# Patient Record
Sex: Female | Born: 1955 | Race: Black or African American | Hispanic: No | Marital: Single | Smoking: Never smoker
Health system: Southern US, Community
[De-identification: ages and names within clinical notes are randomized; demographics above are authoritative.]

## PROBLEM LIST (undated history)

## (undated) DIAGNOSIS — M5430 Sciatica, unspecified side: Secondary | ICD-10-CM

## (undated) DIAGNOSIS — M4640 Discitis, unspecified, site unspecified: Secondary | ICD-10-CM

## (undated) DIAGNOSIS — I739 Peripheral vascular disease, unspecified: Secondary | ICD-10-CM

## (undated) DIAGNOSIS — K219 Gastro-esophageal reflux disease without esophagitis: Secondary | ICD-10-CM

## (undated) DIAGNOSIS — I890 Lymphedema, not elsewhere classified: Secondary | ICD-10-CM

## (undated) DIAGNOSIS — I10 Essential (primary) hypertension: Secondary | ICD-10-CM

## (undated) DIAGNOSIS — M722 Plantar fascial fibromatosis: Secondary | ICD-10-CM

## (undated) DIAGNOSIS — M1990 Unspecified osteoarthritis, unspecified site: Secondary | ICD-10-CM

## (undated) DIAGNOSIS — E7800 Pure hypercholesterolemia, unspecified: Secondary | ICD-10-CM

## (undated) DIAGNOSIS — E119 Type 2 diabetes mellitus without complications: Secondary | ICD-10-CM

## (undated) DIAGNOSIS — G629 Polyneuropathy, unspecified: Secondary | ICD-10-CM

## (undated) DIAGNOSIS — L0291 Cutaneous abscess, unspecified: Secondary | ICD-10-CM

## (undated) HISTORY — DX: Lymphedema, not elsewhere classified: I89.0

## (undated) HISTORY — PX: EYE SURGERY: SHX253

## (undated) HISTORY — PX: COLONOSCOPY: SHX174

## (undated) HISTORY — DX: Peripheral vascular disease, unspecified: I73.9

---
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
## 2006-12-13 ENCOUNTER — Other Ambulatory Visit: Payer: Self-pay

## 2006-12-13 ENCOUNTER — Emergency Department: Payer: Self-pay | Admitting: Emergency Medicine

## 2007-03-26 ENCOUNTER — Ambulatory Visit: Payer: Self-pay | Admitting: Family Medicine

## 2007-08-10 ENCOUNTER — Ambulatory Visit: Payer: Self-pay

## 2008-05-24 ENCOUNTER — Ambulatory Visit: Payer: Self-pay | Admitting: Family Medicine

## 2008-11-29 ENCOUNTER — Ambulatory Visit: Payer: Self-pay | Admitting: Family Medicine

## 2008-12-07 ENCOUNTER — Ambulatory Visit: Payer: Self-pay | Admitting: Family Medicine

## 2009-06-22 ENCOUNTER — Ambulatory Visit: Payer: Self-pay | Admitting: Family Medicine

## 2009-06-23 ENCOUNTER — Ambulatory Visit: Payer: Self-pay | Admitting: Neurology

## 2009-07-12 ENCOUNTER — Ambulatory Visit: Payer: Self-pay | Admitting: Gastroenterology

## 2010-01-03 ENCOUNTER — Ambulatory Visit: Payer: Self-pay | Admitting: Family Medicine

## 2011-04-04 ENCOUNTER — Ambulatory Visit: Payer: Self-pay | Admitting: Family Medicine

## 2011-07-09 ENCOUNTER — Emergency Department: Payer: Self-pay | Admitting: Emergency Medicine

## 2012-11-11 ENCOUNTER — Ambulatory Visit: Payer: Self-pay | Admitting: Family Medicine

## 2013-02-26 ENCOUNTER — Emergency Department: Payer: Self-pay | Admitting: Emergency Medicine

## 2013-03-05 ENCOUNTER — Emergency Department: Payer: Self-pay | Admitting: Emergency Medicine

## 2013-03-05 LAB — URINALYSIS, COMPLETE
Blood: NEGATIVE
Glucose,UR: NEGATIVE mg/dL (ref 0–75)
Ketone: NEGATIVE
Nitrite: NEGATIVE
Ph: 7 (ref 4.5–8.0)
Protein: NEGATIVE
Specific Gravity: 1.019 (ref 1.003–1.030)

## 2013-03-05 LAB — COMPREHENSIVE METABOLIC PANEL
Albumin: 3.5 g/dL (ref 3.4–5.0)
Alkaline Phosphatase: 120 U/L (ref 50–136)
Anion Gap: 5 — ABNORMAL LOW (ref 7–16)
BUN: 16 mg/dL (ref 7–18)
Calcium, Total: 8.9 mg/dL (ref 8.5–10.1)
Chloride: 103 mmol/L (ref 98–107)
Creatinine: 1.11 mg/dL (ref 0.60–1.30)
EGFR (African American): >60
EGFR (Non-African Amer.): 56 — ABNORMAL LOW
Osmolality: 276 (ref 275–301)
Potassium: 4 mmol/L (ref 3.5–5.1)
SGOT(AST): 21 U/L (ref 15–37)

## 2013-03-05 LAB — CBC
HGB: 11.3 g/dL — ABNORMAL LOW (ref 12.0–16.0)
MCH: 27 pg (ref 26.0–34.0)
MCHC: 33.9 g/dL (ref 32.0–36.0)
RBC: 4.19 10*6/uL (ref 3.80–5.20)
RDW: 13.8 % (ref 11.5–14.5)
WBC: 5.5 10*3/uL (ref 3.6–11.0)

## 2013-09-09 ENCOUNTER — Ambulatory Visit: Payer: Self-pay | Admitting: Gastroenterology

## 2013-10-01 HISTORY — PX: COLONOSCOPY: SHX174

## 2013-10-28 ENCOUNTER — Ambulatory Visit: Payer: Self-pay | Admitting: Gastroenterology

## 2014-02-24 ENCOUNTER — Ambulatory Visit: Payer: Self-pay | Admitting: Family Medicine

## 2014-04-02 ENCOUNTER — Emergency Department: Payer: Self-pay | Admitting: Emergency Medicine

## 2015-04-25 ENCOUNTER — Other Ambulatory Visit: Payer: Self-pay | Admitting: Family Medicine

## 2015-04-25 DIAGNOSIS — Z139 Encounter for screening, unspecified: Secondary | ICD-10-CM

## 2015-05-12 ENCOUNTER — Other Ambulatory Visit: Payer: Self-pay | Admitting: Family Medicine

## 2015-05-12 ENCOUNTER — Ambulatory Visit
Admission: RE | Admit: 2015-05-12 | Discharge: 2015-05-12 | Disposition: A | Discharge status: Home / Self Care | Payer: Medicare Other | Source: Ambulatory Visit | Attending: Family Medicine | Admitting: Family Medicine

## 2015-05-12 DIAGNOSIS — Z1231 Encounter for screening mammogram for malignant neoplasm of breast: Secondary | ICD-10-CM

## 2015-05-12 DIAGNOSIS — Z139 Encounter for screening, unspecified: Secondary | ICD-10-CM

## 2015-05-12 DIAGNOSIS — Z1239 Encounter for other screening for malignant neoplasm of breast: Secondary | ICD-10-CM

## 2016-07-01 ENCOUNTER — Other Ambulatory Visit: Payer: Self-pay | Admitting: Family Medicine

## 2016-07-01 DIAGNOSIS — Z1231 Encounter for screening mammogram for malignant neoplasm of breast: Secondary | ICD-10-CM

## 2016-08-06 ENCOUNTER — Ambulatory Visit
Admission: RE | Admit: 2016-08-06 | Discharge: 2016-08-06 | Disposition: A | Discharge status: Home / Self Care | Payer: Medicare Other | Source: Ambulatory Visit | Attending: Family Medicine | Admitting: Family Medicine

## 2016-08-06 DIAGNOSIS — Z1231 Encounter for screening mammogram for malignant neoplasm of breast: Secondary | ICD-10-CM | POA: Insufficient documentation

## 2017-06-18 ENCOUNTER — Other Ambulatory Visit: Payer: Self-pay | Admitting: Family Medicine

## 2017-09-03 ENCOUNTER — Other Ambulatory Visit: Payer: Self-pay | Admitting: Family Medicine

## 2017-09-03 DIAGNOSIS — Z1231 Encounter for screening mammogram for malignant neoplasm of breast: Secondary | ICD-10-CM

## 2017-10-07 ENCOUNTER — Ambulatory Visit
Admission: RE | Admit: 2017-10-07 | Discharge: 2017-10-07 | Disposition: A | Discharge status: Home / Self Care | Payer: Medicare Other | Source: Ambulatory Visit | Attending: Family Medicine | Admitting: Family Medicine

## 2017-10-07 DIAGNOSIS — Z1231 Encounter for screening mammogram for malignant neoplasm of breast: Secondary | ICD-10-CM | POA: Insufficient documentation

## 2017-11-18 ENCOUNTER — Other Ambulatory Visit: Payer: Self-pay

## 2017-11-18 ENCOUNTER — Emergency Department: Payer: Medicare Other

## 2017-11-18 ENCOUNTER — Encounter: Payer: Self-pay | Admitting: Emergency Medicine

## 2017-11-18 ENCOUNTER — Emergency Department
Admission: EM | Admit: 2017-11-18 | Discharge: 2017-11-18 | Disposition: A | Discharge status: Home / Self Care | Payer: Medicare Other | Attending: Emergency Medicine | Admitting: Emergency Medicine

## 2017-11-18 DIAGNOSIS — I10 Essential (primary) hypertension: Secondary | ICD-10-CM | POA: Diagnosis not present

## 2017-11-18 DIAGNOSIS — Z7984 Long term (current) use of oral hypoglycemic drugs: Secondary | ICD-10-CM | POA: Insufficient documentation

## 2017-11-18 DIAGNOSIS — M546 Pain in thoracic spine: Secondary | ICD-10-CM

## 2017-11-18 DIAGNOSIS — Z7982 Long term (current) use of aspirin: Secondary | ICD-10-CM | POA: Insufficient documentation

## 2017-11-18 DIAGNOSIS — E119 Type 2 diabetes mellitus without complications: Secondary | ICD-10-CM | POA: Insufficient documentation

## 2017-11-18 DIAGNOSIS — R079 Chest pain, unspecified: Secondary | ICD-10-CM | POA: Diagnosis not present

## 2017-11-18 HISTORY — DX: Type 2 diabetes mellitus without complications: E11.9

## 2017-11-18 HISTORY — DX: Essential (primary) hypertension: I10

## 2017-11-18 HISTORY — DX: Pure hypercholesterolemia, unspecified: E78.00

## 2017-11-18 LAB — FIBRIN DERIVATIVES D-DIMER (ARMC ONLY): Fibrin derivatives D-dimer (ARMC): 1731.65 ng/mL (FEU) — ABNORMAL HIGH (ref 0.00–499.00)

## 2017-11-18 LAB — BASIC METABOLIC PANEL
Anion gap: 10 (ref 5–15)
BUN: 18 mg/dL (ref 6–20)
CALCIUM: 8.8 mg/dL — AB (ref 8.9–10.3)
CO2: 23 mmol/L (ref 22–32)
CREATININE: 0.98 mg/dL (ref 0.44–1.00)
Chloride: 103 mmol/L (ref 101–111)
GFR calc non Af Amer: >60 mL/min (ref >60)
Glucose, Bld: 118 mg/dL — ABNORMAL HIGH (ref 65–99)
Potassium: 4 mmol/L (ref 3.5–5.1)
SODIUM: 136 mmol/L (ref 135–145)

## 2017-11-18 LAB — CBC
HCT: 35 % (ref 35.0–47.0)
Hemoglobin: 11.5 g/dL — ABNORMAL LOW (ref 12.0–16.0)
MCH: 27.2 pg (ref 26.0–34.0)
MCHC: 32.8 g/dL (ref 32.0–36.0)
MCV: 82.7 fL (ref 80.0–100.0)
PLATELETS: 343 10*3/uL (ref 150–440)
RBC: 4.23 MIL/uL (ref 3.80–5.20)
RDW: 13.6 % (ref 11.5–14.5)
WBC: 4.9 10*3/uL (ref 3.6–11.0)

## 2017-11-18 LAB — TROPONIN I: Troponin I: <0.03 ng/mL (ref <0.03)

## 2017-11-18 MED ORDER — GI COCKTAIL ~~LOC~~
30.0000 mL | Freq: Once | ORAL | Status: AC
  Administered 2017-11-18: 30 mL via ORAL
  Filled 2017-11-18: qty 30

## 2017-11-18 MED ORDER — IOPAMIDOL (ISOVUE-370) INJECTION 76%
125.0000 mL | Freq: Once | INTRAVENOUS | Status: AC | PRN
  Administered 2017-11-18: 125 mL via INTRAVENOUS

## 2017-11-18 MED ORDER — TRAMADOL HCL 50 MG PO TABS: 50.0000 mg | ORAL_TABLET | Freq: Four times a day (QID) | ORAL | 0 refills | Status: DC | PRN

## 2017-11-18 MED ORDER — TRAMADOL HCL 50 MG PO TABS
50.0000 mg | ORAL_TABLET | Freq: Once | ORAL | Status: AC
  Administered 2017-11-18: 50 mg via ORAL
  Filled 2017-11-18: qty 1

## 2017-11-18 NOTE — ED Notes (Signed)
This RN to bedside at this time. Pt resting in bed with family at bedside who is also asleep. NAD noted at this time. Will continue to monitor for further patient needs.

## 2017-11-18 NOTE — ED Notes (Signed)
NAD noted at time of D/C. Pt taken to lobby via wheelchair by family member. Pt denies comments/concerns at this time. This RN discussed driving precautions with patient and family. Pt states understanding.

## 2017-11-18 NOTE — ED Notes (Signed)
MD to bedside at this time to speak with patient regarding her D/C.

## 2017-11-18 NOTE — ED Triage Notes (Signed)
Pt to triage via w/c with no distress noted; pt reports mid/upper back pain radiating into chest x 4 days; denies any known injury, denies hx of same; denies any accomp symptoms

## 2017-11-18 NOTE — Discharge Instructions (Addendum)
Your work-up today is unremarkable.  Please follow-up with your primary care physician and cardiology for further evaluation of his back and chest pain.  Please return with any worsening symptoms or any other concerns.

## 2017-11-18 NOTE — ED Provider Notes (Signed)
[facility] Emergency Department Provider Note   ____________________________________________   First MD Initiated Contact with Patient 11/18/17 [phone]     (approximate)  I have reviewed the triage vital signs and the nursing notes.   HISTORY  Chief Complaint Back Pain and Chest Pain    HPI Nancy Riley is a 62 y.o. female who comes into the hospital today with some back pain and chest pain.  The patient states that her symptoms started 4 days ago.  She states that it is in the top of her chest and the top of her mid back.  She reports that the pain is achy and throbbing.  The patient denies any nausea vomiting dizziness or lightheadedness.  Nothing makes the pain better and she reports that moving makes it worse.  The patient states that she is never had this pain before.  She rates her pain a 10 out of 10 in intensity.  The patient states that she did not take any medication at home for this pain.  She states that she took her regular medicines.  The patient is here today for evaluation of her symptoms and treatment of her pain.   Past Medical History:  Diagnosis Date  . Diabetes mellitus without complication (HCC)   . Hypercholesteremia   . Hypertension     There are no active problems to display for this patient.   Past Surgical History:  Procedure Laterality Date  . BREAST EXCISIONAL BIOPSY Left 1970   negative    Prior to Admission medications   Medication Sig Start Date End Date Taking? Authorizing Provider  aspirin EC 81 MG tablet Take 81 mg by mouth daily.   Yes [provider]  celecoxib (CELEBREX) 200 MG capsule Take 200 mg by mouth 2 (two) times daily with a meal.   Yes [provider]  cyclobenzaprine (FLEXERIL) 5 MG tablet Take 5 mg by mouth 2 (two) times daily as needed for muscle spasms.   Yes [provider]  gabapentin (NEURONTIN) 100 MG capsule Take 100 mg by mouth 3 (three) times daily.   Yes  [provider]  hydrochlorothiazide (HYDRODIURIL) 25 MG tablet Take 25 mg by mouth daily.   Yes [provider]  lisinopril (PRINIVIL,ZESTRIL) 10 MG tablet Take 10 mg by mouth daily.   Yes [provider]  loratadine (CLARITIN) 10 MG tablet Take 10 mg by mouth daily.   Yes [provider]  lovastatin (MEVACOR) 20 MG tablet Take 20 mg by mouth every evening.   Yes [provider]  metFORMIN (GLUCOPHAGE-XR) 500 MG 24 hr tablet Take 500 mg by mouth daily.   Yes [provider]  naproxen sodium (ALEVE) 220 MG tablet Take 220 mg by mouth daily as needed (pain).   Yes [provider]  potassium chloride (MICRO-K) 10 MEQ CR capsule Take 10 mEq by mouth daily.   Yes [provider]  ranitidine (ZANTAC) 150 MG tablet Take 150 mg by mouth 2 (two) times daily.   Yes [provider]  traMADol (ULTRAM) 50 MG tablet Take 1 tablet (50 mg total) by mouth every 6 (six) hours as needed. 11/18/17   Rebecka Apley, MD    Allergies Patient has no known allergies.  No family history on file.  Social History Social History   Tobacco Use  . Smoking status: Never Smoker  . Smokeless tobacco: Never Used  Substance Use Topics  . Alcohol use: Not on file  .  Drug use: Not on file    Review of Systems  Constitutional: No fever/chills Eyes: No visual changes. ENT: No sore throat. Cardiovascular:  chest pain. Respiratory: Denies shortness of breath. Gastrointestinal: No abdominal pain.  No nausea, no vomiting.  No diarrhea.  No constipation. Genitourinary: Negative for dysuria. Musculoskeletal:  back pain. Skin: Negative for rash. Neurological: Negative for headaches, focal weakness or numbness.   ____________________________________________   PHYSICAL EXAM:  VITAL SIGNS: ED Triage Vitals  Enc Vitals Group     BP 11/18/17 0055 (!) 123/52     Pulse Rate 11/18/17 0055 80     Resp 11/18/17 0055 20     Temp 11/18/17  0055 97.6 F (36.4 C)     Temp Source 11/18/17 0055 Oral     SpO2 11/18/17 0055 97 %     Weight 11/18/17 0052 280 lb (127 kg)     Height 11/18/17 0052 5\' 3"  (1.6 m)     Head Circumference --      Peak Flow --      Pain Score 11/18/17 0051 10     Pain Loc --      Pain Edu? --      Excl. in GC? --     Constitutional: Alert and oriented. Well appearing and in moderate distress. Eyes: Conjunctivae are normal. PERRL. EOMI. Head: Atraumatic. Nose: No congestion/rhinnorhea. Mouth/Throat: Mucous membranes are moist.  Oropharynx non-erythematous. Cardiovascular: Normal rate, regular rhythm. Grossly normal heart sounds.  Good peripheral circulation. Respiratory: Normal respiratory effort.  No retractions. Lungs CTAB. Gastrointestinal: Soft and nontender. No distention.  Positive bowel sounds Musculoskeletal: No lower extremity tenderness nor edema.   Neurologic:  Normal speech and language.  Skin:  Skin is warm, dry and intact.  Psychiatric: Mood and affect are normal.   ____________________________________________   LABS (all labs ordered are listed, but only abnormal results are displayed)  Labs Reviewed  BASIC METABOLIC PANEL - Abnormal; Notable for the following components:      Result Value   Glucose, Bld 118 (*)    Calcium 8.8 (*)    All other components within normal limits  CBC - Abnormal; Notable for the following components:   Hemoglobin 11.5 (*)    All other components within normal limits  FIBRIN DERIVATIVES D-DIMER ([facility] ONLY) - Abnormal; Notable for the following components:   Fibrin derivatives D-dimer [facility] 1,731.65 (*)    All other components within normal limits  TROPONIN I  TROPONIN I   ____________________________________________  EKG  ED ECG REPORT I, Rebecka ApleyWebster,  Allison P, the attending physician, personally viewed and interpreted this ECG.   Date: 11/18/2017  EKG Time: 0052  Rate: 81  Rhythm: normal sinus rhythm  Axis: normal  Intervals:none   ST&T Change: none  ____________________________________________  RADIOLOGY  ED MD interpretation:  CXR: Right lung base atelectasis/scarring  CT Angio Chest, Abd and pelvis: No evidence of thoracic or abdominal aortic aneurysm or dissection, no evidence of central pulmonary embolus, 4 mm nodule in the left upper lung, no evidence of active pulmonary disease, diffuse fatty infiltration of the liver, 2.2 cm left adrenal gland nodule, small esophageal hiatal hernia.  Official radiology report(s): Dg Chest 2 View  Result Date: 11/18/2017 CLINICAL DATA:  Upper back pain radiating to chest for 4 days. EXAM: CHEST - 2 VIEW COMPARISON:  Chest radiograph March 26, 2007 FINDINGS: Cardiomediastinal silhouette is normal. No pleural effusions or focal consolidations. Strandy densities RIGHT base. Trachea projects midline and there is no pneumothorax.  Soft tissue planes and included osseous structures are non-suspicious. Mild degenerative change of the spine and acromioclavicular joints. IMPRESSION: RIGHT lung base atelectasis/scarring. Electronically Signed   By: Awilda Metro M.D.   On: 11/18/2017 01:27   Ct Angio Chest/abd/pel For Dissection W And/or Wo Contrast  Result Date: 11/18/2017 CLINICAL DATA:  Mid to upper back pain radiating to the chest for 4 days. No known injury. EXAM: CT ANGIOGRAPHY CHEST, ABDOMEN AND PELVIS TECHNIQUE: Multidetector CT imaging through the chest, abdomen and pelvis was performed using the standard protocol during bolus administration of intravenous contrast. Multiplanar reconstructed images and MIPs were obtained and reviewed to evaluate the vascular anatomy. CONTRAST:  ISOVUE-370 IOPAMIDOL (ISOVUE-370) INJECTION 76% COMPARISON:  CT abdomen and pelvis 09/09/2013 FINDINGS: CTA CHEST FINDINGS Cardiovascular: Noncontrast images of the chest demonstrate no evidence of intramural hematoma. Few scattered calcifications in the aorta. Calcified left hilar lymph node. Images  obtained during arterial phase after contrast administration demonstrate normal caliber thoracic aorta. Thoracic aorta is patent without evidence of dissection. Great vessel origins are patent. Central pulmonary arteries are well opacified without evidence of significant central pulmonary embolus. Heart size is normal. No pericardial effusion. Mediastinum/Nodes: Small esophageal hiatal hernia. Esophagus is decompressed. No significant lymphadenopathy in the chest. Lungs/Pleura: Motion artifact limits examination. No consolidation or airspace disease. Nodule in the left upper lobe posteriorly measuring 4 mm diameter. No pleural effusions. No pneumothorax. Musculoskeletal: Degenerative changes in the spine. No destructive bone lesions. Review of the MIP images confirms the above findings. CTA ABDOMEN AND PELVIS FINDINGS VASCULAR Aorta: Normal caliber abdominal aorta. No aneurysm or dissection. Aorta is patent without significant atherosclerotic change. Celiac: Patent without evidence of aneurysm, dissection, vasculitis or significant stenosis. SMA: Patent without evidence of aneurysm, dissection, vasculitis or significant stenosis. Renals: Both renal arteries are patent without evidence of aneurysm, dissection, vasculitis, fibromuscular dysplasia or significant stenosis. IMA: Patent without evidence of aneurysm, dissection, vasculitis or significant stenosis. Inflow: Patent without evidence of aneurysm, dissection, vasculitis or significant stenosis. Veins: No obvious venous abnormality within the limitations of this arterial phase study. Review of the MIP images confirms the above findings. NON-VASCULAR Hepatobiliary: Diffuse fatty infiltration of the liver. No focal lesions. Gallbladder and bile ducts are unremarkable. Pancreas: Unremarkable. No pancreatic ductal dilatation or surrounding inflammatory changes. Spleen: Normal in size without focal abnormality. Adrenals/Urinary Tract: Left adrenal gland nodule  measuring 2.2 cm diameter. No change since prior study. Likely benign. Nephrograms are symmetrical. No solid renal mass lesions. No hydronephrosis or hydroureter. Bladder is unremarkable. Stomach/Bowel: Stomach is within normal limits. Appendix appears normal. No evidence of bowel wall thickening, distention, or inflammatory changes. Diverticulosis of the sigmoid colon. Lymphatic: No significant lymphadenopathy. Reproductive: Uterus and ovaries are not enlarged. Other: No free air or free fluid in the abdomen. Abdominal wall musculature appears intact. Musculoskeletal: No acute or significant osseous findings. Degenerative changes in the lumbar spine. Review of the MIP images confirms the above findings. IMPRESSION: 1. No evidence of thoracic or abdominal aortic aneurysm or dissection. 2. No evidence of central pulmonary embolus. 3. 4 mm nodule in the left upper lung. No follow-up needed if patient is low-risk. Non-contrast chest CT can be considered in 12 months if patient is high-risk. This recommendation follows the consensus statement: Guidelines for Management of Incidental Pulmonary Nodules Detected on CT Images: From the Fleischner Society 2017; Radiology 2017; 284:228-243. 4. No evidence of active pulmonary disease. 5. Diffuse fatty infiltration of the liver. 6. 2.2 cm left adrenal gland nodule, stable  since prior study, likely benign. 7. Small esophageal hiatal hernia. Electronically Signed   By: Burman Nieves M.D.   On: 11/18/2017 05:15    ____________________________________________   PROCEDURES  Procedure(s) performed: None  Procedures  Critical Care performed: No  ____________________________________________   INITIAL IMPRESSION / ASSESSMENT AND PLAN / ED COURSE  As part of my medical decision making, I reviewed the following data within the electronic MEDICAL RECORD NUMBER Notes from prior ED visits and [place] Controlled Substance Database   This is a 62 year old female who comes into  the hospital today with some back and chest pain.  The patient has been having this pain for 4 days.  My differential diagnosis includes aortic dissection, acute coronary syndrome, pulmonary embolus  We did check some blood work to include a CBC, BMP, d-dimer and 2 troponins.  The patient's d-dimer was elevated but her CT scan was unremarkable.  The patient did receive a GI cocktail and some tramadol.  She will be discharged home to follow-up with her primary care physician as well as cardiology.      ____________________________________________   FINAL CLINICAL IMPRESSION(S) / ED DIAGNOSES  Final diagnoses:  Chest pain, unspecified type  Acute midline thoracic back pain     ED Discharge Orders        Ordered    traMADol (ULTRAM) 50 MG tablet  Every 6 hours PRN     11/18/17 4782       Note:  This document was prepared using Dragon voice recognition software and may include unintentional dictation errors.    Rebecka Apley, MD 11/18/17 [phone]

## 2017-11-26 DIAGNOSIS — E119 Type 2 diabetes mellitus without complications: Secondary | ICD-10-CM | POA: Insufficient documentation

## 2017-11-26 DIAGNOSIS — K219 Gastro-esophageal reflux disease without esophagitis: Secondary | ICD-10-CM | POA: Insufficient documentation

## 2017-11-26 DIAGNOSIS — R079 Chest pain, unspecified: Secondary | ICD-10-CM | POA: Insufficient documentation

## 2017-11-26 DIAGNOSIS — E782 Mixed hyperlipidemia: Secondary | ICD-10-CM | POA: Insufficient documentation

## 2017-11-26 DIAGNOSIS — I10 Essential (primary) hypertension: Secondary | ICD-10-CM | POA: Insufficient documentation

## 2018-06-30 ENCOUNTER — Ambulatory Visit (INDEPENDENT_AMBULATORY_CARE_PROVIDER_SITE_OTHER): Payer: Medicare Other | Admitting: Surgery

## 2018-06-30 ENCOUNTER — Encounter: Payer: Self-pay | Admitting: Surgery

## 2018-06-30 ENCOUNTER — Other Ambulatory Visit: Payer: Self-pay

## 2018-06-30 VITALS — BP 149/79 | HR 102 | Temp 97.3°F | Resp 20 | Ht 63.0 in | Wt 277.8 lb

## 2018-06-30 DIAGNOSIS — R220 Localized swelling, mass and lump, head: Secondary | ICD-10-CM

## 2018-06-30 NOTE — Progress Notes (Signed)
Surgical Clinic History and Physical  Referring provider:  Leanna Sato, MD [address] [place], [place] [address]  HISTORY OF PRESENT ILLNESS (HPI):  63 y.o. female presents for evaluation of her head mass/lesion. Patient reports she's had a mass at the mid-back of her head for many years, and while she describes her brothers and father have similar which have always been hard, hers was previously soft/spongy until it became hard 1 month ago. She denies any pain, redness, or drainage.   PAST MEDICAL HISTORY (PMH):  Past Medical History:  Diagnosis Date  . Diabetes mellitus without complication (HCC)   . Hypercholesteremia   . Hypertension     PAST SURGICAL HISTORY (PSH):  Past Surgical History:  Procedure Laterality Date  . BREAST EXCISIONAL BIOPSY Left 1970   negative  . COLONOSCOPY      MEDICATIONS:  Prior to Admission medications   Medication Sig Start Date End Date Taking? Authorizing Provider  aspirin EC 81 MG tablet Take 81 mg by mouth daily.   Yes [provider]  celecoxib (CELEBREX) 200 MG capsule Take 200 mg by mouth 2 (two) times daily with a meal.   Yes [provider]  cyclobenzaprine (FLEXERIL) 5 MG tablet Take 5 mg by mouth 2 (two) times daily as needed for muscle spasms.   Yes [provider]  gabapentin (NEURONTIN) 100 MG capsule Take 100 mg by mouth 3 (three) times daily.   Yes [provider]  hydrochlorothiazide (HYDRODIURIL) 25 MG tablet Take 25 mg by mouth daily.   Yes [provider]  lisinopril (PRINIVIL,ZESTRIL) 10 MG tablet Take 10 mg by mouth daily.   Yes [provider]  loratadine (CLARITIN) 10 MG tablet Take 10 mg by mouth daily.   Yes [provider]  lovastatin (MEVACOR) 20 MG tablet Take 20 mg by mouth every evening.   Yes [provider]  metFORMIN (GLUCOPHAGE-XR) 500 MG 24 hr tablet Take 500 mg by mouth daily.   Yes [provider]  naproxen sodium (ALEVE)  220 MG tablet Take 220 mg by mouth daily as needed (pain).   Yes [provider]  potassium chloride (MICRO-K) 10 MEQ CR capsule Take 10 mEq by mouth daily.   Yes [provider]  ranitidine (ZANTAC) 150 MG tablet Take 150 mg by mouth 2 (two) times daily.   Yes [provider]    ALLERGIES:  No Known Allergies   SOCIAL HISTORY:  Social History   Socioeconomic History  . Marital status: Single    Spouse name: Not on file  . Number of children: Not on file  . Years of education: Not on file  . Highest education level: Not on file  Occupational History  . Not on file  Social Needs  . Financial resource strain: Not on file  . Food insecurity:    Worry: Not on file    Inability: Not on file  . Transportation needs:    Medical: Not on file    Non-medical: Not on file  Tobacco Use  . Smoking status: Never Smoker  . Smokeless tobacco: Never Used  Substance and Sexual Activity  . Alcohol use: Not Currently  . Drug use: Never  . Sexual activity: Not on file  Lifestyle  . Physical activity:    Days per week: Not on file    Minutes per session: Not on file  . Stress: Not on file  Relationships  . Social connections:    Talks on  phone: Not on file    Gets together: Not on file    Attends religious service: Not on file    Active member of club or organization: Not on file    Attends meetings of clubs or organizations: Not on file    Relationship status: Not on file  . Intimate partner violence:    Fear of current or ex partner: Not on file    Emotionally abused: Not on file    Physically abused: Not on file    Forced sexual activity: Not on file  Other Topics Concern  . Not on file  Social History Narrative  . Not on file    The patient currently resides (home / rehab facility / nursing home): Home The patient normally is (ambulatory / bedbound): Ambulatory  FAMILY HISTORY:  Family History  Problem Relation Age of Onset  . Breast cancer Neg  Hx   . Colon cancer Neg Hx     Otherwise negative/non-contributory.  REVIEW OF SYSTEMS:  Constitutional: denies any other weight loss, fever, chills, or sweats  Eyes: denies any other vision changes, history of eye injury  ENT: denies sore throat, hearing problems  Respiratory: denies shortness of breath, wheezing  Cardiovascular: denies chest pain, palpitations  Gastrointestinal: denies abdominal pain, N/V, or diarrhea Musculoskeletal: denies any other joint pains or cramps  Skin: Denies any other rashes or skin discolorations except as per HPI Neurological: denies any other headache, dizziness, weakness  Psychiatric: Denies any other depression, anxiety   All other review of systems were otherwise negative   VITAL SIGNS:  BP (!) 149/79   Pulse (!) 102   Temp (!) 97.3 F (36.3 C) (Temporal)   Resp 20   Ht 5\' 3"  (1.6 m)   Wt 277 lb 12.8 oz (126 kg)   SpO2 98%   BMI 49.21 kg/m  PHYSICAL EXAM:  Constitutional:  -- Normal body habitus  -- Awake, alert, and oriented x3  Eyes:  -- Pupils equally round and reactive to light  -- No scleral icterus  Ear, nose, throat:  -- No jugular venous distension -- No nasal drainage, bleeding Pulmonary:  -- No crackles  -- Equal breath sounds bilaterally -- Breathing non-labored at rest Cardiovascular:  -- S1, S2 present  -- No pericardial rubs  Gastrointestinal:  -- Abdomen soft, nontender, non-distended, no guarding/rebound  -- No abdominal masses appreciated, pulsatile or otherwise  Musculoskeletal and Integumentary:  -- Wounds or skin discoloration: hard occipital bony prominence without obvious discretely abnormal or tender to palpation mass, no evidence of surrounding erythema, fluctuance, or drainage -- Extremities: B/L UE and LE FROM, hands and feet warm, no edema  Neurologic:  -- Motor function: Intact and symmetric -- Sensation: Intact and symmetric  Labs:  CBC:  Lab Results  Component Value Date   WBC 4.9  11/18/2017   RBC 4.23 11/18/2017   BMP:  Lab Results  Component Value Date   GLUCOSE 118 (H) 11/18/2017   GLUCOSE 121 (H) 03/05/2013   CO2 23 11/18/2017   CO2 29 03/05/2013   BUN 18 11/18/2017   BUN 16 03/05/2013   CREATININE 0.98 11/18/2017   CREATININE 1.11 03/05/2013   CALCIUM 8.8 (L) 11/18/2017   CALCIUM 8.9 03/05/2013    Imaging studies: No new pertinent imaging studies available for review at this time   Assessment/Plan:  63 y.o. female with occipital bony prominence without obvious discretely abnormal or tender to palpation mass, complicated by co-morbidities including DM, HTN, and hypercholesterolemia.   -  appears to be part of patient's normal anatomy  - if enlarges, suggestive of neoplasm, may consider additional imaging  - particularly considering her mid-occipital prominence is asymptomatic, no surgical intervention advised  - return to clinic as needed, instructed to call if any questions or concerns  All of the above recommendations were discussed with the patient and patient's family, and all of patient's and family's questions were answered to their expressed satisfaction.  Thank you for the opportunity to participate in this patient's care.  -- Scherrie GerlachJason E. Earlene Plateravis, MD, RPVI [place]: [facility] General Surgery - Partnering for exceptional care. Office: [phone]

## 2018-06-30 NOTE — Progress Notes (Deleted)
Surgical Clinic History and Physical  Referring provider:  Leanna SatoMiles, Linda M, MD [address] [place], [place] [id number]  HISTORY OF PRESENT ILLNESS (HPI):  63 y.o. female presents for evaluation of ***. Patient reports ***, denies ***.  PAST MEDICAL HISTORY (PMH):  Past Medical History:  Diagnosis Date  . Diabetes mellitus without complication (HCC)   . Hypercholesteremia   . Hypertension      PAST SURGICAL HISTORY (PSH):  Past Surgical History:  Procedure Laterality Date  . BREAST EXCISIONAL BIOPSY Left 1970   negative  . COLONOSCOPY       MEDICATIONS:  Prior to Admission medications   Medication Sig Start Date End Date Taking? Authorizing Provider  aspirin EC 81 MG tablet Take 81 mg by mouth daily.   Yes [provider]  celecoxib (CELEBREX) 200 MG capsule Take 200 mg by mouth 2 (two) times daily with a meal.   Yes [provider]  cyclobenzaprine (FLEXERIL) 5 MG tablet Take 5 mg by mouth 2 (two) times daily as needed for muscle spasms.   Yes [provider]  gabapentin (NEURONTIN) 100 MG capsule Take 100 mg by mouth 3 (three) times daily.   Yes [provider]  hydrochlorothiazide (HYDRODIURIL) 25 MG tablet Take 25 mg by mouth daily.   Yes [provider]  lisinopril (PRINIVIL,ZESTRIL) 10 MG tablet Take 10 mg by mouth daily.   Yes [provider]  loratadine (CLARITIN) 10 MG tablet Take 10 mg by mouth daily.   Yes [provider]  lovastatin (MEVACOR) 20 MG tablet Take 20 mg by mouth every evening.   Yes [provider]  metFORMIN (GLUCOPHAGE-XR) 500 MG 24 hr tablet Take 500 mg by mouth daily.   Yes [provider]  naproxen sodium (ALEVE) 220 MG tablet Take 220 mg by mouth daily as needed (pain).   Yes [provider]  potassium chloride (MICRO-K) 10 MEQ CR capsule Take 10 mEq by mouth daily.   Yes [provider]  ranitidine (ZANTAC) 150 MG tablet Take 150 mg by mouth 2  (two) times daily.   Yes [provider]     ALLERGIES:  No Known Allergies   SOCIAL HISTORY:  Social History   Socioeconomic History  . Marital status: Single    Spouse name: Not on file  . Number of children: Not on file  . Years of education: Not on file  . Highest education level: Not on file  Occupational History  . Not on file  Social Needs  . Financial resource strain: Not on file  . Food insecurity:    Worry: Not on file    Inability: Not on file  . Transportation needs:    Medical: Not on file    Non-medical: Not on file  Tobacco Use  . Smoking status: Never Smoker  . Smokeless tobacco: Never Used  Substance and Sexual Activity  . Alcohol use: Not Currently  . Drug use: Never  . Sexual activity: Not on file  Lifestyle  . Physical activity:    Days per week: Not on file    Minutes per session: Not on file  . Stress: Not on file  Relationships  . Social connections:    Talks on phone: Not on file    Gets together: Not on file    Attends religious service: Not on file    Active member of club or organization: Not on file    Attends meetings of clubs or organizations: Not  on file    Relationship status: Not on file  . Intimate partner violence:    Fear of current or ex partner: Not on file    Emotionally abused: Not on file    Physically abused: Not on file    Forced sexual activity: Not on file  Other Topics Concern  . Not on file  Social History Narrative  . Not on file    The patient currently resides (home / rehab facility / nursing home): ***Home The patient normally is (ambulatory / bedbound): ***Ambulatory  FAMILY HISTORY:  Family History  Problem Relation Age of Onset  . Breast cancer Neg Hx   . Colon cancer Neg Hx     Otherwise negative/non-contributory.  REVIEW OF SYSTEMS:  Constitutional: denies any other weight loss, fever, chills, or sweats  Eyes: denies any other vision changes, history of eye injury  ENT: denies sore  throat, hearing problems  Respiratory: denies shortness of breath, wheezing  Cardiovascular: denies chest pain, palpitations  Gastrointestinal: ***denies abdominal pain, N/V, ***or diarrhea/and bowel function as per HPI Musculoskeletal: denies any other joint pains or cramps  Skin: Denies any other rashes or skin discolorations ***except as per HPI Neurological: denies any other headache, dizziness, weakness  Psychiatric: Denies any other depression, anxiety   All other review of systems were otherwise negative   VITAL SIGNS:  @VSRANGES @     Height: 5\' 3"  (160 cm) Weight: 277 lb 12.8 oz (126 kg) BMI (Calculated): 49.22   PHYSICAL EXAM:  Constitutional:  -- ***Normal body habitus  -- ***Awake, alert, and oriented x3  Eyes:  -- Pupils equally round and reactive to light  -- No scleral icterus  Ear, nose, throat:  -- ***No jugular venous distension -- No nasal drainage, bleeding Pulmonary:  -- ***No crackles  -- ***Equal breath sounds bilaterally -- ***Breathing non-labored at rest Cardiovascular:  -- S1, S2 present  -- No pericardial rubs  Gastrointestinal:  -- ***Abdomen soft, nontender, non-distended, no guarding/rebound  -- ***No abdominal masses appreciated, pulsatile or otherwise  Musculoskeletal and Integumentary:  -- Wounds or skin discoloration: *** -- Extremities: ***B/L UE and LE FROM, hands and feet warm, ***no edema  Neurologic:  -- Motor function: ***Intact and symmetric -- Sensation: ***Intact and symmetric  Pulse/Doppler Exam:  (p=palpable; d=doppler signals; 0=none)     Right   Left   Ax  ***   ***   Brach  ***   ***   Rad  ***   ***   Uln  ***   ***   Fem  ***   ***   Pop  ***   ***   DP  ***   ***   PT  ***   ***   Labs: {Labs :18171}   Imaging studies:  ***   Assessment/Plan:  63 y.o. female with ***, complicated by co-morbidities including ***.   - ***   - ***   - ***   - return to clinic ***in *** weeks/as needed, instructed to call  if any questions or concerns  All of the above recommendations were discussed with the patient ***and patient's family, and all of patient's ***and family's questions were answered to ***his/her/their expressed satisfaction.  Thank you for the opportunity to participate in this patient's care.  -- Scherrie Gerlach Earlene Plater, MD, RPVI [place]: [facility] General [facility] - Partnering for exceptional care. Office: [phone]

## 2018-06-30 NOTE — Patient Instructions (Signed)
Please call our office if you have questions or concerns.  If you notice the area of concerns gets larger call our office and we can schedule some images.

## 2018-07-05 ENCOUNTER — Encounter: Payer: Self-pay | Admitting: Surgery

## 2018-08-20 DIAGNOSIS — Z6841 Body Mass Index (BMI) 40.0 and over, adult: Secondary | ICD-10-CM | POA: Insufficient documentation

## 2018-10-18 ENCOUNTER — Emergency Department
Admission: EM | Admit: 2018-10-18 | Discharge: 2018-10-18 | Disposition: A | Discharge status: Home / Self Care | Payer: Medicare Other | Attending: Emergency Medicine | Admitting: Emergency Medicine

## 2018-10-18 ENCOUNTER — Other Ambulatory Visit: Payer: Self-pay

## 2018-10-18 DIAGNOSIS — I10 Essential (primary) hypertension: Secondary | ICD-10-CM | POA: Diagnosis not present

## 2018-10-18 DIAGNOSIS — Z7984 Long term (current) use of oral hypoglycemic drugs: Secondary | ICD-10-CM | POA: Diagnosis not present

## 2018-10-18 DIAGNOSIS — Z7982 Long term (current) use of aspirin: Secondary | ICD-10-CM | POA: Diagnosis not present

## 2018-10-18 DIAGNOSIS — M5137 Other intervertebral disc degeneration, lumbosacral region: Secondary | ICD-10-CM | POA: Diagnosis not present

## 2018-10-18 DIAGNOSIS — G8929 Other chronic pain: Secondary | ICD-10-CM | POA: Diagnosis not present

## 2018-10-18 DIAGNOSIS — E119 Type 2 diabetes mellitus without complications: Secondary | ICD-10-CM | POA: Diagnosis not present

## 2018-10-18 DIAGNOSIS — Z79899 Other long term (current) drug therapy: Secondary | ICD-10-CM | POA: Insufficient documentation

## 2018-10-18 DIAGNOSIS — M545 Low back pain: Secondary | ICD-10-CM | POA: Diagnosis present

## 2018-10-18 LAB — URINALYSIS, COMPLETE (UACMP) WITH MICROSCOPIC
Bacteria, UA: NONE SEEN
Bilirubin Urine: NEGATIVE
Glucose, UA: NEGATIVE mg/dL
Hgb urine dipstick: NEGATIVE
Ketones, ur: NEGATIVE mg/dL
Nitrite: NEGATIVE
Protein, ur: NEGATIVE mg/dL
Specific Gravity, Urine: 1.014 (ref 1.005–1.030)
pH: 7 (ref 5.0–8.0)

## 2018-10-18 MED ORDER — TRAMADOL HCL 50 MG PO TABS: 50.0000 mg | ORAL_TABLET | Freq: Two times a day (BID) | ORAL | 0 refills | Status: DC

## 2018-10-18 MED ORDER — KETOROLAC TROMETHAMINE 10 MG PO TABS: 10.0000 mg | ORAL_TABLET | Freq: Three times a day (TID) | ORAL | 0 refills | Status: DC

## 2018-10-18 MED ORDER — KETOROLAC TROMETHAMINE 30 MG/ML IJ SOLN
30.0000 mg | Freq: Once | INTRAMUSCULAR | Status: AC
  Administered 2018-10-18: 12:00:00 30 mg via INTRAMUSCULAR
  Filled 2018-10-18: qty 1

## 2018-10-18 MED ORDER — ORPHENADRINE CITRATE 30 MG/ML IJ SOLN
60.0000 mg | INTRAMUSCULAR | Status: AC
  Administered 2018-10-18: 12:00:00 60 mg via INTRAMUSCULAR
  Filled 2018-10-18: qty 2

## 2018-10-18 NOTE — ED Triage Notes (Signed)
Pt comes into the ED via EMS from home with c/o lower back pain for the past 3 days. Denies injury

## 2018-10-18 NOTE — ED Notes (Signed)
Pt taken to bathroom to attempt urine specimen

## 2018-10-18 NOTE — Discharge Instructions (Addendum)
You should take the prescription meds as directed. Hold your daily Celebrex while taking the Ketorolac. You may take 2 cyclobenzaprine pills, if needed for more severe pain. Follow-up with Dr. Marvis Moeller for ongoing symptoms. Return to the ED as needed.

## 2018-10-20 NOTE — ED Provider Notes (Signed)
[facility] Emergency Department Provider Note ____________________________________________  Time seen: 1325  I have reviewed the triage vital signs and the nursing notes.  HISTORY  Chief Complaint  Back Pain  HPI Nancy Riley is a 63 y.o. female who presents to the ED via EMS from home with complaints of low back pain.  Patient describes a 3-day complaint of low back pain without preceding injury, trauma, accident, or fall.  Patient admits to increasing episodes of intermittent low back pain without sciatica.  She denies any distal paresthesias, foot drop, saddle anesthesias, or weakness. She does have a history of previously noted lumbar DDD, L4-L5 anterolisthesis, and endplate spurring.  She is not currently under the care of her orthopedic provider or spine specialist for her back pain.  She denies any previous treatment specific to her back pain.  Past Medical History:  Diagnosis Date  . Diabetes mellitus without complication (HCC)   . Hypercholesteremia   . Hypertension     There are no active problems to display for this patient.   Past Surgical History:  Procedure Laterality Date  . BREAST EXCISIONAL BIOPSY Left 1970   negative  . COLONOSCOPY      Prior to Admission medications   Medication Sig Start Date End Date Taking? Authorizing Provider  aspirin EC 81 MG tablet Take 81 mg by mouth daily.    [provider]  celecoxib (CELEBREX) 200 MG capsule Take 200 mg by mouth 2 (two) times daily with a meal.    [provider]  cyclobenzaprine (FLEXERIL) 5 MG tablet Take 5 mg by mouth 2 (two) times daily as needed for muscle spasms.    [provider]  gabapentin (NEURONTIN) 100 MG capsule Take 100 mg by mouth 3 (three) times daily.    [provider]  hydrochlorothiazide (HYDRODIURIL) 25 MG tablet Take 25 mg by mouth daily.    [provider]  ketorolac (TORADOL) 10 MG tablet Take 1 tablet (10 mg total)  by mouth every 8 (eight) hours. 10/18/18   Martasia Talamante, Charlesetta IvoryJenise V Bacon, PA-C  lisinopril (PRINIVIL,ZESTRIL) 10 MG tablet Take 10 mg by mouth daily.    [provider]  loratadine (CLARITIN) 10 MG tablet Take 10 mg by mouth daily.    [provider]  lovastatin (MEVACOR) 20 MG tablet Take 20 mg by mouth every evening.    [provider]  metFORMIN (GLUCOPHAGE-XR) 500 MG 24 hr tablet Take 500 mg by mouth daily.    [provider]  naproxen sodium (ALEVE) 220 MG tablet Take 220 mg by mouth daily as needed (pain).    [provider]  potassium chloride (MICRO-K) 10 MEQ CR capsule Take 10 mEq by mouth daily.    [provider]  ranitidine (ZANTAC) 150 MG tablet Take 150 mg by mouth 2 (two) times daily.    [provider]  traMADol (ULTRAM) 50 MG tablet Take 1 tablet (50 mg total) by mouth 2 (two) times daily. 10/18/18   Eilam Shrewsbury, Charlesetta IvoryJenise V Bacon, PA-C    Allergies Patient has no known allergies.  Family History  Problem Relation Age of Onset  . Breast cancer Neg Hx   . Colon cancer Neg Hx     Social History Social History   Tobacco Use  . Smoking status: Never Smoker  . Smokeless tobacco: Never Used  Substance Use Topics  . Alcohol use: Not Currently  . Drug use: Never    Review of Systems  Constitutional: Negative  for fever. Eyes: Negative for visual changes. ENT: Negative for sore throat. Cardiovascular: Negative for chest pain. Respiratory: Negative for shortness of breath. Gastrointestinal: Negative for abdominal pain, vomiting and diarrhea. Genitourinary: Negative for dysuria. Musculoskeletal: Positive for back pain. Skin: Negative for rash. Neurological: Negative for headaches, focal weakness or numbness. ____________________________________________  PHYSICAL EXAM:  VITAL SIGNS: ED Triage Vitals  Enc Vitals Group     BP 10/18/18 1108 113/62     Pulse Rate 10/18/18 1107 64     Resp 10/18/18 1107 17     Temp  10/18/18 1107 97.7 F (36.5 C)     Temp Source 10/18/18 1107 Oral     SpO2 10/18/18 1107 99 %     Weight 10/18/18 1107 285 lb (129.3 kg)     Height 10/18/18 1107  (1.6 m)     Head Circumference --      Peak Flow --      Pain Score 10/18/18 1107 10     Pain Loc --      Pain Edu? --      Excl. in GC? --     Constitutional: Alert and oriented. Well appearing and in no distress. Head: Normocephalic and atraumatic. Eyes: Conjunctivae are normal. Normal extraocular movements Neck: Supple. No thyromegaly. Cardiovascular: Normal rate, regular rhythm. Normal distal pulses. Respiratory: Normal respiratory effort. No wheezes/rales/rhonchi. Gastrointestinal: Soft and nontender. No distention. Musculoskeletal: Normal spinal alignment without midline tenderness, spasm, deformity, or step-off.  Patient mild tenderness to palpation to the lumbar sacral junction with pain localized bilaterally over the sacrum. Nontender with normal range of motion in all extremities.  Neurologic: Cranial nerves II through XII grossly intact.  Normal LE DTRs bilaterally.  Normal gait without ataxia. Normal speech and language. No gross focal neurologic deficits are appreciated. Skin:  Skin is warm, dry and intact. No rash noted. Psychiatric: Mood and affect are normal. Patient exhibits appropriate insight and judgment. ____________________________________________   RADIOLOGY Not indicated ____________________________________________  PROCEDURES  Procedures Toradol 30 mg IM ____________________________________________  INITIAL IMPRESSION / ASSESSMENT AND PLAN / ED COURSE  Nancy Riley was evaluated in Emergency Department on 10/20/2018 for the symptoms described in the history of present illness. She was evaluated in the context of the global COVID-19 pandemic, which necessitated consideration that the patient might be at risk for infection with the SARS-CoV-2 virus that causes COVID-19. Institutional  protocols and algorithms that pertain to the evaluation of patients at risk for COVID-19 are in a state of rapid change based on information released by regulatory bodies including the CDC and federal and state organizations. These policies and algorithms were followed during the patient's care in the ED.  Patient with ED evaluation of lower back pain with onset 3 days prior.  She has a history of degenerative disc disease and anterolisthesis.  This may be the cause of her acute on chronic pain.  Her exam is otherwise without any acute neuromuscular deficit.  She reports improvement of her symptoms after IM medication administration.  She is discharged with prescriptions for ketorolac and Ultram to take as directed.  She will follow with primary provider or return to the ED for ongoing symptoms.  She may increase the dose of her home cyclobenzaprine 2 pills per dose as needed for increased muscle pain. ____________________________________________  FINAL CLINICAL IMPRESSION(S) / ED DIAGNOSES  Final diagnoses:  DDD (degenerative disc disease), lumbosacral  Chronic midline low back pain without sciatica      Magdala Brahmbhatt V  Tomasa Blase, PA-C 10/20/18 [id number]    Dionne Bucy, MD 10/25/18 [phone]

## 2018-10-21 ENCOUNTER — Other Ambulatory Visit: Payer: Self-pay | Admitting: Emergency Medicine

## 2018-10-21 DIAGNOSIS — Z1231 Encounter for screening mammogram for malignant neoplasm of breast: Secondary | ICD-10-CM

## 2018-12-02 ENCOUNTER — Ambulatory Visit: Payer: Medicare Other | Attending: Family Medicine | Admitting: Occupational Therapy

## 2018-12-02 ENCOUNTER — Encounter: Payer: Self-pay | Admitting: Occupational Therapy

## 2018-12-02 ENCOUNTER — Other Ambulatory Visit: Payer: Self-pay

## 2018-12-02 DIAGNOSIS — I890 Lymphedema, not elsewhere classified: Secondary | ICD-10-CM | POA: Insufficient documentation

## 2018-12-02 NOTE — Addendum Note (Signed)
Addended by: Ansel Bong on: 12/02/2018 05:15 PM   Modules accepted: Orders

## 2018-12-02 NOTE — Patient Instructions (Signed)

## 2018-12-02 NOTE — Therapy (Addendum)
[facility]IN [facility] [address] [place], [place], [address] Phone: [phone]   Fax:  [phone]  Occupational Therapy Evaluation  Patient Details  Name: Karene FryBeatrice B Zagami MRN: [id number] Date of Birth: [birth year] Referring Provider (OT): Darreld McleanLinda Miles, MD   Encounter Date: 12/02/2018  OT End of Session - 12/02/18 0951    Visit Number  1    Number of Visits  36    Date for OT Re-Evaluation  03/02/19    OT Start Time  0915       Past Medical History:  Diagnosis Date  . Diabetes mellitus without complication (HCC)   . Hypercholesteremia   . Hypertension     Past Surgical History:  Procedure Laterality Date  . BREAST EXCISIONAL BIOPSY Left 1970   negative  . COLONOSCOPY      There were no vitals filed for this visit.  Subjective Assessment - 12/02/18 0926    Subjective   Olen CordialBeatrice Koontz is referred to Occupational Therapy for evaluation and treatment of BLE lymphedema with onset reported as "years ago." Pt describes worsening leg swelling bilaterally ~ 2 months ago when back pain worsened. She is referred by her PCP Darreld [facility]cleanLinda Miles, [facility], [facility]. Ms Rise PatienceGraves arives in transport wheelchair. She deies known family history of limb swelling. She has not previously undergone lymphedema treatment. She tells me historically she has been unable to tolerate off-the-shelf, circular knit, elastic compression stockings b/c they "roll down at the top." Pt's stated goals are, 1. be able to walk better; get the swelling down in my legs to reduce pain; and 3. Be able to wear dress shoes so I can goback to church. " I don't feel comfortable going to church in tennis shoes."    Pertinent History  Dx relevant to lymphedema (LE): HTN, DM, neuropathy, DJD, arthritis, sciatica,  chronic back pain, morbid obesity, (BMI 50-59.9)    Repetition  Increases Symptoms    Special Tests  strong + Stemmer sign at base of toes bilaterally     Currently in Pain?  Yes    Pain Score  5     Pain Location  Leg    Pain Orientation  Right;Left    Pain Descriptors / Indicators  Discomfort;Sore;Burning;Heaviness;Numbness;Pins and needles;Tender;Tightness;Other (Comment)   full   Pain Type  Chronic pain    Pain Onset  Other (comment)   onset "years ago"   Pain Frequency  Intermittent    Aggravating Factors   standing, walking,    Pain Relieving Factors  elevation, rubbing, pain meds    Effect of Pain on Daily Activities  chronic leg swelling and associated pain, contribute to functional deficits with ambulation and functional mobility, falls risk, basic and instrumental ADLs, leisure pursuits, productive activities, social participation, caregiver role performace and body image    Multiple Pain Sites  Yes    Pain Score  7    Pain Location  Back    Pain Orientation  Lower    Pain Type  Neuropathic pain;Chronic pain;Intractable pain    Pain Frequency  Intermittent        [facility] OT Assessment - 12/02/18 0001      Assessment   Medical Diagnosis  sever, stage II, BLE lymphedema 2/2 morbid obesity, Significant secondary contributing factors include HTN, low activity tolerance, chronic pain and  dependent positioning    Referring Provider (OT)  Darreld McleanLinda Miles, MD    Onset Date/Surgical Date  --  onset "years ago" with worsening over past 2 months   Prior Therapy  no CDT, poor tolerance for OTS compression      Precautions   Precautions  Fall   Lymphedema precautions: skin precautions for DM     Restrictions   Weight Bearing Restrictions  No      Balance Screen   Has the patient fallen in the past 6 months  No    Has the patient had a decrease in activity level because of a fear of falling?   Yes      Home  Environment   Alternate Level Stairs - Number of Steps  --   single level home   Astronomer;Other (comment)   no grab bars, has shower seat , but stands to bathe   Additional Comments  rolator in  the home. 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 stands to make light meals    Leisure  game on phone, TV    Comments  sedentary ; sleeps in reclinerr with legs elevated by report      IADL   Prior Level of Function Shopping  Max A    Shopping  Completely unable to shop    Prior Level of Function Light Housekeeping  Max A    Light Housekeeping  Needs help with all home maintenance tasks    Prior Level of Function Meal Prep  Mod A    Meal Prep  Able to complete simple cold meal and snack prep    Community Mobility  Relies on family or friends for transportation   [place]     Mobility   Mobility Status  History of falls      Activity Tolerance   Activity Tolerance  Tolerates 10-20 min activity with multiple rests      Cognition   Overall Cognitive Status  Within Functional Limits for tasks assessed      Posture/Postural Control   Posture/Postural Control  No significant limitations      Sensation   Light Touch  Appears Intact      ROM / Strength   AROM / PROM / Strength  AROM;Strength;PROM      AROM   Overall AROM   Deficits;Other (comment)   due to pain, swelling and girth at ankles knees and hips   AROM Assessment Site  --      PROM   Overall PROM   Deficits    Overall PROM Comments  limited by pain, girth and swelling      Strength   Overall Strength  Deficits        Severe, stage II, BLE lymphedema  2/2 Morbid Obesity . Exacerbating factors include dependent positioning, decreased activity level and possible CVI  Skin  Description  Hyper-Keratosis Peau' de Orange Shiny Tight Fibrotic Fatty Doughy Indurated    B thighs x x severe x x x   Hydration Dry Flaky Erythema Macerated   mild      Color Redness Present Pallor Blanching Hemosiderin Staining Other     x absent  Odor Malodorous Yeast Fungal infection  Absent      x   Temperature Warm Cool wnl    x    Pitting Edema   1+ 2+ 3+ 4+ Non-pitting        x   Girth Symmetrical Asymmetrical Other Distribution    R>L    Stemmer Sign Positive Negative     +    Lymphorrea History Of:  Present Absent      x   Wounds History Of Present Absent Venous Arterial Pressure Size     x          Signs of Infection Redness Warmth Erythema Acute Swelling Drainage Borders                   Scars  x Adhesions Hypersensitivity          Sensation Light Touch Deep pressure Hypersensitivty   Present Impaired Present Impaired Absent Impaired   In tact     absent  x  Nails WNL Fungus Other     TBA  x Hair Growth Symmetrical Asymmetrical   x    Skin Creases Base of toes  Ankles   Base of Finger Medial Thigh                x x   x                      OT Education - 12/02/18 0949    Education Details  Provided Pt and family education regarding lymphatic structure and function, etiologies, onset patterns and stages of progression. Discussed  impact of obesity on lymphatic system function. Outlined Complete Decongestive Therapy (CDT)  as standard of care and provided in depth information regarding 4 primary components of both Intensive and Self Management Phases, including Manual Lymph Drainage (MLD), compression wrapping and garments, skin care, and therapeutic exercise.   Homero FellersFrank discussion of high burden of care and in this case, need for consistent , daily caregiver assistance for optimal clinical outcome. Discussed  Importance of daily, ongoing LE self-care essential to retaining clinical gains and limiting progression.  Lastly, reviewed  lymphedema precautions, including cellulitis risk and difficulty with wound healing. Provided printed Lymphedema Workbook for reference.    Person(s) Educated  Patient    Methods  Explanation;Demonstration;Tactile cues;Verbal cues;Handout    Comprehension  Verbalized understanding;Returned demonstration;Need further instruction          OT Long Term Goals - 12/02/18 1635      OT LONG TERM GOAL #1   Title  Pt will demonstrate understanding of lymphedema precautions and signs/ symptoms of cellulitisby identifying 6 items for each using printed Lymphedema Workbook for reference PRN (modified independence) to limit LE progression and infection risk.    Baseline  Max A    Time  4    Period  Days    Status  New    Target Date  --   4th LE Rx visit     OT LONG TERM GOAL #2   Title  Pt will require Max assist from caregiver who will be able to apply multilayer, gradient compression wraps using correct techniques after skilled training to reduce limb volume and limity LE progression.    Baseline  dependent    Time  4    Period  Days    Status  New    Target Date  --   4th OT rx  visit     OT  LONG TERM GOAL #3   Title  Pt will achieve 10% limb volume reductions  bilaterally from ankle to tibial tuberosity during Intensive Phase CDT to return limbs to typical size and shape ,. reduce infection risk and improve basic andf instrumental ALDs performance.    Baseline  dependent    Time  12    Period  Weeks    Status  New    Target Date  03/02/19      OT LONG TERM GOAL #4   Title  Pt will consistently perform all LE self -care home protocols ( simple self MLD, skin care, therapeutic exercise, compression therapies) daily  with max CG assistance to to achieve max edema reduction, to enanble functional improvements,  including fitting preferred street shoes and standard sized clothing, and to achieve increased AROM and reduced falls risk.    Baseline  dependent    Period  Weeks    Status  New     Target Date  03/02/19      OT LONG TERM GOAL #5   Title  Patient will understand the importance of weight management, increased activity level, and leg elevation for optimal control of limb swelling over time.    Baseline  dependent    Time  12    Period  Weeks    Status  New    Target Date  03/02/19            Plan - 12/02/18 1514    Clinical Impression Statement  Olen CordialBeatrice Knoth presents with severe, stage II, BLE lymphedema secondary to morbid obesity. Contributing factors include  decreased activity level with sedentary lifestyle, dependent positioning, chronic pain/ inflamation and suspected CVI, possible OSA,. Pt denies family hx of limb swelling, but hereditary cantribution may also be a contributing issue in this case given onset pattern. Chronic leg swelling and assosictaed pain limit safe transfers and functional ambulation, basic and instrumental ADLs ( sleeping, bathing, dressing, fitting street shoes, nail care, diabetic skin care and inspection,, cooking, cleaning, caregiving, driving) . Lymphedema and assosicated pain limits producticve activities, leisure pursuits, and social participation, including attendance at church. Lymphedema limits body image and interfers with caregiving role for aged parent. Pt will benefit from skilled Occupational Therapy to reduce limb volume, limit infection risk, improve transfer safety and functional ambulation, improve ADLs performance, limit infection risk, and to limit progression of chronic lymphedema progression. Without OT for Complete Decongestive Therapy (CDT) lymphedema will worssen and Pt will experience further functional decline.    OT Occupational Profile and History  Comprehensive Assessment- Review of records and extensive additional review of physical, cognitive, psychosocial history related to current functional performance    Occupational performance deficits (Please refer to evaluation for details):   ADL's;IADL's;Leisure;Social Participation;Rest and Sleep;Other;Work   Programme researcher, broadcasting/film/videobody image, productive activities, role performance   Body Structure / Function / Physical Skills  ADL;Obesity;Decreased knowledge of precautions;Balance;Body mechanics;Decreased knowledge of use of DME;Flexibility;IADL;Pain;Skin integrity;Strength;Edema;Gait;Mobility;ROM    Rehab Potential  Good    Clinical Decision Making  Multiple treatment options, significant modification of task necessary    Comorbidities Affecting Occupational Performance:  Presence of comorbidities impacting occupational performance    Comorbidities impacting occupational performance description:  see SUBJECTIVE    Modification or Assistance to Complete Evaluation   Max significant modification of tasks or assist is necessary to complete    OT Frequency  3x / week    OT Duration  12 weeks   consider reducing to 2 x weekly depending on  level of and cinsistency of CG support   OT Treatment/Interventions  Self-care/ADL training;Therapeutic exercise;DME and/or AE instruction;Building services engineer;Compression bandaging;Other (comment);Manual Therapy;Patient/family education;Therapeutic activities;Manual lymph drainage;Energy conservation    Plan  Intensive Phase CDT= Manual lymphatic drainage, (MLD) skin care to improve tissue health, therapeutic exercise (lymphatic pumping ther ex) and gradient compression wrapping one leg at a time with short stretch compression bandages (knee length in Ms. Grave's case) . Pt edu for LE self care  is ongoing and intensive . Management Pgase CDT is on follow-up PRN basis to support management over time, replacing compression garments, etc.    Recommended Other Services  fit with BLE custom knee high compression stockings, toe caps and convoluted foam HOS devices to limit fibrosis formation ( progression) during HOS    Consulted and Agree with Plan of Care  Patient       Patient will benefit from skilled therapeutic  intervention in order to improve the following deficits and impairments:   Body Structure / Function / Physical Skills: ADL, Obesity, Decreased knowledge of precautions, Balance, Body mechanics, Decreased knowledge of use of DME, Flexibility, IADL, Pain, Skin integrity, Strength, Edema, Gait, Mobility, ROM       Visit Diagnosis: 1. Lymphedema, not elsewhere classified       Problem List There are no active problems to display for this patient.   Loel Dubonnet, MS, OTR/L, CLT-LANA 12/02/18 5:11 PM  [facility] MAIN [facility] SERVICES [address] [place], [place], [address] Phone: [phone]   Fax:  [phone]  Name: SKYRAH KRUPP MRN: [id number] Date of Birth: [birth year]

## 2018-12-08 ENCOUNTER — Ambulatory Visit: Payer: Medicare Other | Admitting: Occupational Therapy

## 2018-12-08 ENCOUNTER — Other Ambulatory Visit: Payer: Self-pay

## 2018-12-08 DIAGNOSIS — I890 Lymphedema, not elsewhere classified: Secondary | ICD-10-CM

## 2018-12-08 NOTE — Therapy (Signed)
[facility] [address] [place], [place], [address] Phone: [phone]   Fax:  [phone]  Occupational Therapy Treatment  Patient Details  Name: Nancy FryBeatrice B Riley MRN: [id number] Date of Birth: [birth year] Referring Provider (OT): Darreld McleanLinda Miles, MD   Encounter Date: 12/08/2018  OT End of Session - 12/08/18 0935    Visit Number  2    Number of Visits  36    Date for OT Re-Evaluation  03/02/19    OT Start Time  0915    OT Stop Time  1020    OT Time Calculation (min)  65 min       Past Medical History:  Diagnosis Date  . Diabetes mellitus without complication (HCC)   . Hypercholesteremia   . Hypertension     Past Surgical History:  Procedure Laterality Date  . BREAST EXCISIONAL BIOPSY Left 1970   negative  . COLONOSCOPY      There were no vitals filed for this visit.  Subjective Assessment - 12/08/18 0923    Subjective   Olen CordialBeatrice Glymph presents in transport wc for OT Rx day 2/ 36 to address BLE lymphedema . Pt asks for clarification re links between cancer and LE. We discussed in detail and Pt verbalized understanding.    Pertinent History  Dx relevant to lymphedema (LE): HTN, DM, neuropathy, DJD, arthritis, sciatica,  chronic back pain, morbid obesity, (BMI 50-59.9)    Repetition  Increases Symptoms    Special Tests  strong + Stemmer sign at base of toes bilaterally    Currently in Pain?  Yes    Pain Score  5     Pain Location  Knee    Pain Orientation  Left;Right    Pain Descriptors / Indicators  Sore    Pain Type  Chronic pain    Pain Onset  Other (comment)   onset "years ago"         LYMPHEDEMA/ONCOLOGY QUESTIONNAIRE - 12/08/18 1316      Lymphedema Assessments   Lymphedema Assessments  Lower extremities              OT Treatments/Exercises (OP) - 12/08/18 0001      ADLs   ADL Education Given  Yes      Manual Therapy   Manual Therapy  Edema management;Manual Lymphatic Drainage  (MLD);Compression Bandaging    Manual therapy comments  intial comparative limb volumetrics    Compression Bandaging  knee length compression wraps applied using circumferential gradient techniques, including one 10 cm, one 12 cm and one 15 cm ss  bandages applied in small to large sequence over Rosidal foam . Good tolerance in clinic. Unable to fit street shoe. Issued friction socks to limit fall risk.             OT Education - 12/08/18 0934    Education Details  Continued skilled Pt/caregiver education  And LE ADL training throughout visit for lymphedema self care/ home program, including compression wrapping, compression garment and device wear/care, lymphatic pumping ther ex, simple self-MLD, and skin care. Discussed progress towards goals.    Person(s) Educated  Patient    Methods  Explanation;Demonstration;Tactile cues;Verbal cues;Handout    Comprehension  Verbalized understanding;Returned demonstration;Need further instruction          OT Long Term Goals - 12/02/18 1635      OT LONG TERM GOAL #1   Title  Pt will demonstrate understanding of lymphedema precautions and signs/ symptoms of cellulitisby  identifying 6 items for each using printed Lymphedema Workbook for reference PRN (modified independence) to limit LE progression and infection risk.    Baseline  Max A    Time  4    Period  Days    Status  New    Target Date  --   4th LE Rx visit     OT LONG TERM GOAL #2   Title  Pt will require Max assist from caregiver who will be able to apply multilayer, gradient compression wraps using correct techniques after skilled training to reduce limb volume and limity LE progression.    Baseline  dependent    Time  4    Period  Days    Status  New    Target Date  --   4th OT rx  visit     OT LONG TERM GOAL #3   Title  Pt will achieve 10% limb volume reductions  bilaterally from ankle to tibial tuberosity during Intensive Phase CDT to return limbs to typical size and shape ,.  reduce infection risk and improve basic andf instrumental ALDs performance.    Baseline  dependent    Time  12    Period  Weeks    Status  New    Target Date  03/02/19      OT LONG TERM GOAL #4   Title  Pt will consistently perform all LE self -care home protocols ( simple self MLD, skin care, therapeutic exercise, compression therapies) daily  with max CG assistance to to achieve max edema reduction, to enanble functional improvements,  including fitting preferred street shoes and standard sized clothing, and to achieve increased AROM and reduced falls risk.    Baseline  dependent    Period  Weeks    Status  New    Target Date  03/02/19      OT LONG TERM GOAL #5   Title  Patient will understand the importance of weight management, increased activity level, and leg elevation for optimal control of limb swelling over time.    Baseline  dependent    Time  12    Period  Weeks    Status  New    Target Date  03/02/19            Plan - 12/08/18 1655    Clinical Impression Statement  Completed BLE comparative limb volumetrics from A(ankle) - Dt (tibial tuberosity ). Measurements revels RLE (dominant)  is 14.34% greater in volume than LLE. This value is known as the Limb Volume Differential, (LVD). Applied knee length, RLE compression wraps using gradient techniques. Cont as per POC. Emphasis of next 2-3 visists is CG training for compression wrapping.    OT Occupational Profile and History  Comprehensive Assessment- Review of records and extensive additional review of physical, cognitive, psychosocial history related to current functional performance    Occupational performance deficits (Please refer to evaluation for details):  ADL's;IADL's;Leisure;Social Participation;Rest and Sleep;Other;Work   Programme researcher, broadcasting/film/videobody image, productive activities, role performance   Body Structure / Function / Physical Skills  ADL;Obesity;Decreased knowledge of precautions;Balance;Body mechanics;Decreased knowledge of use  of DME;Flexibility;IADL;Pain;Skin integrity;Strength;Edema;Gait;Mobility;ROM    Rehab Potential  Good    Clinical Decision Making  Multiple treatment options, significant modification of task necessary    Comorbidities Affecting Occupational Performance:  Presence of comorbidities impacting occupational performance    Comorbidities impacting occupational performance description:  see SUBJECTIVE    Modification or Assistance to Complete Evaluation   Max significant modification  of tasks or assist is necessary to complete    OT Frequency  3x / week    OT Duration  12 weeks   consider reducing to 2 x weekly depending on level of and cinsistency of CG support   OT Treatment/Interventions  Self-care/ADL training;Therapeutic exercise;DME and/or AE instruction;Therapist, nutritional;Compression bandaging;Other (comment);Manual Therapy;Patient/family education;Therapeutic activities;Manual lymph drainage;Energy conservation    Plan  Intensive Phase CDT= Manual lymphatic drainage, (MLD) skin care to improve tissue health, therapeutic exercise (lymphatic pumping ther ex) and gradient compression wrapping one leg at a time with short stretch compression bandages (knee length in Ms. Grave's case) . Pt edu for LE self care  is ongoing and intensive . Management Pgase CDT is on follow-up PRN basis to support management over time, replacing compression garments, etc.    Recommended Other Services  fit with BLE custom knee high compression stockings, toe caps and convoluted foam HOS devices to limit fibrosis formation ( progression) during HOS    Consulted and Agree with Plan of Care  Patient       Patient will benefit from skilled therapeutic intervention in order to improve the following deficits and impairments:   Body Structure / Function / Physical Skills: ADL, Obesity, Decreased knowledge of precautions, Balance, Body mechanics, Decreased knowledge of use of DME, Flexibility, IADL, Pain, Skin integrity,  Strength, Edema, Gait, Mobility, ROM       Visit Diagnosis: 1. Lymphedema, not elsewhere classified       Problem List There are no active problems to display for this patient.   Andrey Spearman, MS, OTR/L, [facility] 12/08/18 5:00 PM   [place] MAIN [facility] SERVICES [address] [place], [place], [address] Phone: [phone]   Fax:  [phone]  Name: JALEEAH SLIGHT MRN: [id number] Date of Birth: 1955/07/29

## 2018-12-09 ENCOUNTER — Other Ambulatory Visit: Payer: Self-pay

## 2018-12-09 ENCOUNTER — Ambulatory Visit: Payer: Medicare Other | Admitting: Occupational Therapy

## 2018-12-09 DIAGNOSIS — I890 Lymphedema, not elsewhere classified: Secondary | ICD-10-CM

## 2018-12-09 NOTE — Therapy (Signed)
[place] MAIN [facility] SERVICES [address] [place], [place], [address] Phone: [phone]   Fax:  [phone]  Occupational Therapy Treatment  Patient Details  Name: Nancy Riley MRN: [id number] Date of Birth: Dec 15, 1955 Referring Provider (OT): Delight Stare, MD   Encounter Date: 12/09/2018  OT End of Session - 12/09/18 0927    Visit Number  3    Number of Visits  36    Date for OT Re-Evaluation  03/02/19    OT Start Time  0915    OT Stop Time  1015    OT Time Calculation (min)  60 min    Activity Tolerance  Patient tolerated treatment well;No increased pain    Behavior During Therapy  WFL for tasks assessed/performed       Past Medical History:  Diagnosis Date  . Diabetes mellitus without complication ([place])   . Hypercholesteremia   . Hypertension     Past Surgical History:  Procedure Laterality Date  . BREAST EXCISIONAL BIOPSY Left 1970   negative  . COLONOSCOPY      There were no vitals filed for this visit.  Subjective Assessment - 12/09/18 0913    Subjective   [name] presents in transport wc for OT Rx day 3/ 36 to address BLE lymphedema . Pt arrives wearing multilayer compression wrapps applied yesterday. Pt  reports she had no difficulty tolerating RLE wraps overnight.    Pertinent History  Dx relevant to lymphedema (LE): HTN, DM, neuropathy, DJD, arthritis, sciatica,  chronic back pain, morbid obesity, (BMI 50-59.9)    Repetition  Increases Symptoms    Special Tests  strong + Stemmer sign at base of toes bilaterally    Pain Onset  Other (comment)   onset "years ago"                  OT Treatments/Exercises (OP) - 12/09/18 0001      ADLs   ADL Education Given  Yes      Manual Therapy   Manual Therapy  Edema management;Manual Lymphatic Drainage (MLD);Compression Bandaging    Compression Bandaging  knee length compression wraps applied using circumferential gradient techniques, including one 10 cm, one  12 cm and one 15 cm ss  bandages applied in small to large sequence over Rosidal foam . Good tolerance in clinic. Unable to fit street shoe. Issued friction socks to limit fall risk.             OT Education - 12/09/18 0925    Education Details  Pt edu re post op shoe as alternative to tennis shoe or friction slippers to limit fall risk    Person(s) Educated  Patient    Methods  Explanation;Demonstration;Tactile cues;Verbal cues;Handout    Comprehension  Verbalized understanding;Returned demonstration;Need further instruction          OT Long Term Goals - 12/09/18 1234      OT LONG TERM GOAL #1   Title  Pt will demonstrate understanding of lymphedema precautions and signs/ symptoms of cellulitisby identifying 6 items for each using printed Lymphedema Workbook for reference PRN (modified independence) to limit LE progression and infection risk.    Baseline  Max A    Time  4    Period  Days    Status  New      OT LONG TERM GOAL #2   Title  Pt will require Max assist from caregiver who will be able to apply multilayer, gradient compression wraps using  correct techniques after skilled training to reduce limb volume and limity LE progression.    Baseline  dependent    Time  4    Period  Days    Status  New      OT LONG TERM GOAL #3   Title  Pt will achieve 10% limb volume reductions  bilaterally from ankle to tibial tuberosity during Intensive Phase CDT to return limbs to typical size and shape ,. reduce infection risk and improve basic andf instrumental ALDs performance.    Baseline  dependent    Time  12    Period  Weeks    Status  New      OT LONG TERM GOAL #4   Title  Pt will consistently perform all LE self -care home protocols ( simple self MLD, skin care, therapeutic exercise, compression therapies) daily  with max CG assistance to to achieve max edema reduction, to enanble functional improvements,  including fitting preferred street shoes and standard sized clothing,  and to achieve increased AROM and reduced falls risk.    Baseline  dependent    Period  Weeks    Status  New      OT LONG TERM GOAL #5   Title  Patient will understand the importance of weight management, increased activity level, and leg elevation for optimal control of limb swelling over time.    Baseline  dependent    Time  12    Period  Weeks    Status  New            Plan - 12/09/18 1230    Clinical Impression Statement  Pt able to tolerate multilayer, knee length, gradient compression wraps to RLE over last 24 hours without difficulty. (GOAL MET. ) RLE limb volume is markedly decreased upon removing knee length wraps.  Commenced initial MLD today as Pt has no caregiver present today to learn compression wrap techniques. Verbally guided Pt in wrapping process to reinforce her roll as Mudlogger of caregiver. Reapplied wraps. Pt instructed to attempt rewrapping with caregiver using strategies and techniques she has learned thus far. Cont as per POC.    OT Occupational Profile and History  Comprehensive Assessment- Review of records and extensive additional review of physical, cognitive, psychosocial history related to current functional performance    Occupational performance deficits (Please refer to evaluation for details):  ADL's;IADL's;Leisure;Social Participation;Rest and Sleep;Other;Work   Engineer, building services, productive activities, role performance   Body Structure / Function / Physical Skills  ADL;Obesity;Decreased knowledge of precautions;Balance;Body mechanics;Decreased knowledge of use of DME;Flexibility;IADL;Pain;Skin integrity;Strength;Edema;Gait;Mobility;ROM    Rehab Potential  Good    Clinical Decision Making  Multiple treatment options, significant modification of task necessary    Comorbidities Affecting Occupational Performance:  Presence of comorbidities impacting occupational performance    Comorbidities impacting occupational performance description:  see SUBJECTIVE     Modification or Assistance to Complete Evaluation   Max significant modification of tasks or assist is necessary to complete    OT Frequency  3x / week    OT Duration  12 weeks   consider reducing to 2 x weekly depending on level of and cinsistency of CG support   OT Treatment/Interventions  Self-care/ADL training;Therapeutic exercise;DME and/or AE instruction;Therapist, nutritional;Compression bandaging;Other (comment);Manual Therapy;Patient/family education;Therapeutic activities;Manual lymph drainage;Energy conservation    Plan  Intensive Phase CDT= Manual lymphatic drainage, (MLD) skin care to improve tissue health, therapeutic exercise (lymphatic pumping ther ex) and gradient compression wrapping one leg at a time with  short stretch compression bandages (knee length in Ms. Grave's case) . Pt edu for LE self care  is ongoing and intensive . Management Pgase CDT is on follow-up PRN basis to support management over time, replacing compression garments, etc.    Recommended Other Services  fit with BLE custom knee high compression stockings, toe caps and convoluted foam HOS devices to limit fibrosis formation ( progression) during HOS    Consulted and Agree with Plan of Care  Patient       Patient will benefit from skilled therapeutic intervention in order to improve the following deficits and impairments:   Body Structure / Function / Physical Skills: ADL, Obesity, Decreased knowledge of precautions, Balance, Body mechanics, Decreased knowledge of use of DME, Flexibility, IADL, Pain, Skin integrity, Strength, Edema, Gait, Mobility, ROM       Visit Diagnosis: 1. Lymphedema, not elsewhere classified       Problem List There are no active problems to display for this patient.    Andrey Spearman, MS, OTR/L, [facility] 12/09/18 12:35 PM   [place] MAIN [facility] SERVICES [address] [place], [place], [address] Phone: [phone]   Fax:   [phone]  Name: CHARNA NEEB MRN: [id number] Date of Birth: [birth year]

## 2018-12-15 ENCOUNTER — Ambulatory Visit: Payer: Medicare Other | Admitting: Occupational Therapy

## 2018-12-15 ENCOUNTER — Other Ambulatory Visit: Payer: Self-pay

## 2018-12-15 DIAGNOSIS — I890 Lymphedema, not elsewhere classified: Secondary | ICD-10-CM

## 2018-12-15 NOTE — Therapy (Signed)
[place] [facility] [address] [place], [place], [address] Phone: [phone]   Fax:  [phone]  Occupational Therapy Treatment  Patient Details  Name: Nancy Riley MRN: [id number] Date of Birth: 22-Jun-1955 Referring Provider (OT): Nancy McleanLinda Miles, MD   Encounter Date: 12/15/2018  OT End of Session - 12/15/18 1502    Visit Number  4    Number of Visits  36    Date for OT Re-Evaluation  03/02/19    OT Start Time  0315    OT Stop Time  0415    OT Time Calculation (min)  60 min    Activity Tolerance  Patient tolerated treatment well;No increased pain    Behavior During Therapy  WFL for tasks assessed/performed       Past Medical History:  Diagnosis Date  . Diabetes mellitus without complication ([facility])   . Hypercholesteremia   . Hypertension     Past Surgical History:  Procedure Laterality Date  . BREAST EXCISIONAL BIOPSY Left 1970   negative  . COLONOSCOPY      There were no vitals filed for this visit.  Subjective Assessment - 12/15/18 1501    Subjective   [name] presents in transport wc for OT Rx day 4/ 36 to address BLE lymphedema . Pt arrives wearing multilayer compression wrapps applied yesterday. Pt  reports she had no difficulty tolerating RLE wraps overnight.    Pertinent History  Dx relevant to lymphedema (LE): HTN, DM, neuropathy, DJD, arthritis, sciatica,  chronic back pain, morbid obesity, (BMI 50-59.9)    Repetition  Increases Symptoms    Special Tests  strong + Stemmer sign at base of toes bilaterally    Pain Onset  Other (comment)   onset "years ago"                          OT Education - 12/15/18 1502    Education Details  Continued skilled Pt/caregiver education  And LE ADL training throughout visit for lymphedema self care/ home program, including compression wrapping, compression garment and device wear/care, lymphatic pumping ther ex, simple self-MLD, and skin care. Discussed  progress towards goals.    Person(s) Educated  Patient    Methods  Explanation;Demonstration;Tactile cues;Verbal cues;Handout    Comprehension  Verbalized understanding;Returned demonstration;Need further instruction          OT Long Term Goals - 12/09/18 1234      OT LONG TERM GOAL #1   Title  Pt will demonstrate understanding of lymphedema precautions and signs/ symptoms of cellulitisby identifying 6 items for each using printed Lymphedema Workbook for reference PRN (modified independence) to limit LE progression and infection risk.    Baseline  Max A    Time  4    Period  Days    Status  New      OT LONG TERM GOAL #2   Title  Pt will require Max assist from caregiver who will be able to apply multilayer, gradient compression wraps using correct techniques after skilled training to reduce limb volume and limity LE progression.    Baseline  dependent    Time  4    Period  Days    Status  New      OT LONG TERM GOAL #3   Title  Pt will achieve 10% limb volume reductions  bilaterally from ankle to tibial tuberosity during Intensive Phase CDT to return limbs to typical size and  shape ,. reduce infection risk and improve basic andf instrumental ALDs performance.    Baseline  dependent    Time  12    Period  Weeks    Status  New      OT LONG TERM GOAL #4   Title  Pt will consistently perform all LE self -care home protocols ( simple self MLD, skin care, therapeutic exercise, compression therapies) daily  with max CG assistance to to achieve max edema reduction, to enanble functional improvements,  including fitting preferred street shoes and standard sized clothing, and to achieve increased AROM and reduced falls risk.    Baseline  dependent    Period  Weeks    Status  New      OT LONG TERM GOAL #5   Title  Patient will understand the importance of weight management, increased activity level, and leg elevation for optimal control of limb swelling over time.    Baseline  dependent     Time  12    Period  Weeks    Status  New            Plan - 12/15/18 1615    Clinical Impression Statement  Pt's sister able to apply multi layer c knee length compression wraps using correct gradient techniques after skilled teaching.Pt tolerates excellent tolerance. Commence MLD to RLE next visit.    OT Occupational Profile and History  Comprehensive Assessment- Review of records and extensive additional review of physical, cognitive, psychosocial history related to current functional performance    Occupational performance deficits (Please refer to evaluation for details):  ADL's;IADL's;Leisure;Social Participation;Rest and Sleep;Other;Work   Engineer, building services, productive activities, role performance   Body Structure / Function / Physical Skills  ADL;Obesity;Decreased knowledge of precautions;Balance;Body mechanics;Decreased knowledge of use of DME;Flexibility;IADL;Pain;Skin integrity;Strength;Edema;Gait;Mobility;ROM    Rehab Potential  Good    Clinical Decision Making  Multiple treatment options, significant modification of task necessary    Comorbidities Affecting Occupational Performance:  Presence of comorbidities impacting occupational performance    Comorbidities impacting occupational performance description:  see SUBJECTIVE    Modification or Assistance to Complete Evaluation   Max significant modification of tasks or assist is necessary to complete    OT Frequency  3x / week    OT Duration  12 weeks   consider reducing to 2 x weekly depending on level of and cinsistency of CG support   OT Treatment/Interventions  Self-care/ADL training;Therapeutic exercise;DME and/or AE instruction;Therapist, nutritional;Compression bandaging;Other (comment);Manual Therapy;Patient/family education;Therapeutic activities;Manual lymph drainage;Energy conservation    Plan  Intensive Phase CDT= Manual lymphatic drainage, (MLD) skin care to improve tissue health, therapeutic exercise (lymphatic  pumping ther ex) and gradient compression wrapping one leg at a time with short stretch compression bandages (knee length in Ms. Grave's case) . Pt edu for LE self care  is ongoing and intensive . Management Pgase CDT is on follow-up PRN basis to support management over time, replacing compression garments, etc.    Recommended Other Services  fit with BLE custom knee high compression stockings, toe caps and convoluted foam HOS devices to limit fibrosis formation ( progression) during HOS    Consulted and Agree with Plan of Care  Patient       Patient will benefit from skilled therapeutic intervention in order to improve the following deficits and impairments:   Body Structure / Function / Physical Skills: ADL, Obesity, Decreased knowledge of precautions, Balance, Body mechanics, Decreased knowledge of use of DME, Flexibility, IADL, Pain, Skin integrity,  Strength, Edema, Gait, Mobility, ROM       Visit Diagnosis: 1. Lymphedema, not elsewhere classified       Problem List There are no active problems to display for this patient.   Loel Dubonnetheresa Ermagene Saidi, MS, OTR/L, [facility] 12/15/18 4:17 PM  [place] [facility] [address] [place], [place], [address] Phone: [phone]   Fax:  [phone]  Name: Nancy FryBeatrice B Riley MRN: [id number] Date of Birth: 11-May-1956

## 2018-12-21 ENCOUNTER — Ambulatory Visit: Payer: Medicare Other | Admitting: Occupational Therapy

## 2018-12-21 ENCOUNTER — Other Ambulatory Visit: Payer: Self-pay

## 2018-12-21 DIAGNOSIS — I890 Lymphedema, not elsewhere classified: Secondary | ICD-10-CM

## 2018-12-21 NOTE — Therapy (Signed)
[facility] [address] [place], [place], [address] Phone: [phone]   Fax:  [phone]  Occupational Therapy Treatment  Patient Details  Name: Nancy FryBeatrice B Wooley MRN: [id number] Date of Birth: 18-Feb-1956 Referring Provider (OT): Darreld McleanLinda Miles, MD   Encounter Date: 12/21/2018  OT End of Session - 12/21/18 1307    Visit Number  5    Number of Visits  36    Date for OT Re-Evaluation  03/02/19    OT Start Time  1015    OT Stop Time  1120    OT Time Calculation (min)  65 min    Activity Tolerance  Patient tolerated treatment well;No increased pain   limited by body habitus. Unable to reach feet and lower legs 2/2 girth   Behavior During Therapy  [facility] for tasks assessed/performed       Past Medical History:  Diagnosis Date  . Diabetes mellitus without complication ([facility])   . Hypercholesteremia   . Hypertension     Past Surgical History:  Procedure Laterality Date  . BREAST EXCISIONAL BIOPSY Left 1970   negative  . COLONOSCOPY      There were no vitals filed for this visit.  Subjective Assessment - 12/21/18 1024    Subjective   Nancy Riley presents in transport wc for OT Rx day 5/ 36 to address BLE lymphedema . Pt presents with multilayer compression wrap applied by her sister who attended last appoi9ntment to learn gradient techniques. She is wearing smaller post op shoe. Pt states, "I feel pretty good today."    Pertinent History  Dx relevant to lymphedema (LE): HTN, DM, neuropathy, DJD, arthritis, sciatica,  chronic back pain, morbid obesity, (BMI 50-59.9)    Repetition  Increases Symptoms    Special Tests  strong + Stemmer sign at base of toes bilaterally    Pain Onset  Other (comment)   onset "years ago"                  OT Treatments/Exercises (OP) - 12/21/18 0001      ADLs   ADL Education Given  Yes      Manual Therapy   Manual Therapy  Edema management;Manual Lymphatic Drainage (MLD);Compression  Bandaging    Manual Lymphatic Drainage (MLD)  MLD to RLE using short neck sequence , deep diaphragmatic breathing and functional inguinal pathways.    Compression Bandaging  added 2 -12 cm wide short stretch wraps to eastablished knee length configuration to extend bandage above R knee to mid thigh  in effort to reduce medial thigh lobule.             OT Education - 12/21/18 1308    Education Details  Continued skilled Pt/caregiver education  And LE ADL training throughout visit for lymphedema self care/ home program, including compression wrapping, compression garment and device wear/care, lymphatic pumping ther ex, simple self-MLD, and skin care. Discussed progress towards goals.    Person(s) Educated  Patient    Methods  Explanation;Demonstration;Tactile cues;Verbal cues;Handout    Comprehension  Verbalized understanding;Returned demonstration;Need further instruction          OT Long Term Goals - 12/09/18 1234      OT LONG TERM GOAL #1   Title  Pt will demonstrate understanding of lymphedema precautions and signs/ symptoms of cellulitisby identifying 6 items for each using printed Lymphedema Workbook for reference PRN (modified independence) to limit LE progression and infection risk.    Baseline  Max  A    Time  4    Period  Days    Status  New      OT LONG TERM GOAL #2   Title  Pt will require Max assist from caregiver who will be able to apply multilayer, gradient compression wraps using correct techniques after skilled training to reduce limb volume and limity LE progression.    Baseline  dependent    Time  4    Period  Days    Status  New      OT LONG TERM GOAL #3   Title  Pt will achieve 10% limb volume reductions  bilaterally from ankle to tibial tuberosity during Intensive Phase CDT to return limbs to typical size and shape ,. reduce infection risk and improve basic andf instrumental ALDs performance.    Baseline  dependent    Time  12    Period  Weeks    Status   New      OT LONG TERM GOAL #4   Title  Pt will consistently perform all LE self -care home protocols ( simple self MLD, skin care, therapeutic exercise, compression therapies) daily  with max CG assistance to to achieve max edema reduction, to enanble functional improvements,  including fitting preferred street shoes and standard sized clothing, and to achieve increased AROM and reduced falls risk.    Baseline  dependent    Period  Weeks    Status  New      OT LONG TERM GOAL #5   Title  Patient will understand the importance of weight management, increased activity level, and leg elevation for optimal control of limb swelling over time.    Baseline  dependent    Time  12    Period  Weeks    Status  New            Plan - 12/21/18 1308    Clinical Impression Statement  Pt demnstrates excellent compliance with compression during visit interval. Sister remains supportive assistant. Limb volume reducing below knee on R makes fatty fibrosis at distal leg and ankle more palpable . Pt agreed to allow compression wraps to mid thigh today in effort to reduce medial thigh lobule, which interfers with anbulation and functional mobility. Unsure if these will remain in place without rolling due to softness of tissue, so applied in such a way that Pt will be able to remove these extra wraps without removing 3 layers below the knee. Cont as per POC.    OT Occupational Profile and History  Comprehensive Assessment- Review of records and extensive additional review of physical, cognitive, psychosocial history related to current functional performance    Occupational performance deficits (Please refer to evaluation for details):  ADL's;IADL's;Leisure;Social Participation;Rest and Sleep;Other;Work   Programme researcher, broadcasting/film/videobody image, productive activities, role performance   Body Structure / Function / Physical Skills  ADL;Obesity;Decreased knowledge of precautions;Balance;Body mechanics;Decreased knowledge of use of  DME;Flexibility;IADL;Pain;Skin integrity;Strength;Edema;Gait;Mobility;ROM    Rehab Potential  Good    Clinical Decision Making  Multiple treatment options, significant modification of task necessary    Comorbidities Affecting Occupational Performance:  Presence of comorbidities impacting occupational performance    Comorbidities impacting occupational performance description:  see SUBJECTIVE    Modification or Assistance to Complete Evaluation   Max significant modification of tasks or assist is necessary to complete    OT Frequency  3x / week    OT Duration  12 weeks   consider reducing to 2 x weekly depending on  level of and cinsistency of CG support   OT Treatment/Interventions  Self-care/ADL training;Therapeutic exercise;DME and/or AE instruction;Therapist, nutritional;Compression bandaging;Other (comment);Manual Therapy;Patient/family education;Therapeutic activities;Manual lymph drainage;Energy conservation    Plan  Intensive Phase CDT= Manual lymphatic drainage, (MLD) skin care to improve tissue health, therapeutic exercise (lymphatic pumping ther ex) and gradient compression wrapping one leg at a time with short stretch compression bandages (knee length in Ms. Grave's case) . Pt edu for LE self care  is ongoing and intensive . Management Pgase CDT is on follow-up PRN basis to support management over time, replacing compression garments, etc.    Recommended Other Services  fit with BLE custom knee high compression stockings, toe caps and convoluted foam HOS devices to limit fibrosis formation ( progression) during HOS    Consulted and Agree with Plan of Care  Patient       Patient will benefit from skilled therapeutic intervention in order to improve the following deficits and impairments:   Body Structure / Function / Physical Skills: ADL, Obesity, Decreased knowledge of precautions, Balance, Body mechanics, Decreased knowledge of use of DME, Flexibility, IADL, Pain, Skin integrity,  Strength, Edema, Gait, Mobility, ROM       Visit Diagnosis: 1. Lymphedema, not elsewhere classified       Problem List There are no active problems to display for this patient.   Andrey Spearman, MS, OTR/L, [facility] 12/21/18 1:13 PM  [place] MAIN [facility] SERVICES [address] [place], [place], [address] Phone: [phone]   Fax:  [phone]  Name: JOHNESHA ACHEAMPONG MRN: [id number] Date of Birth: [birth year]

## 2018-12-23 ENCOUNTER — Other Ambulatory Visit: Payer: Self-pay

## 2018-12-23 ENCOUNTER — Ambulatory Visit: Payer: Medicare Other | Admitting: Occupational Therapy

## 2018-12-23 DIAGNOSIS — I890 Lymphedema, not elsewhere classified: Secondary | ICD-10-CM | POA: Diagnosis not present

## 2018-12-23 NOTE — Therapy (Signed)
[facility] [address] [place], [place], [address] Phone: [phone]   Fax:  [phone]  Occupational Therapy Treatment  Patient Details  Name: Nancy FryBeatrice B Riley MRN: [id number] Date of Birth: 1955/11/30 Referring Provider (OT): Darreld McleanLinda Miles, MD   Encounter Date: 12/23/2018  OT End of Session - 12/23/18 1322    Visit Number  6    Number of Visits  36    Date for OT Re-Evaluation  03/02/19    OT Start Time  0915    OT Stop Time  1025    OT Time Calculation (min)  70 min    Activity Tolerance  Patient tolerated treatment well;No increased pain   limited by body habitus. Unable to reach feet and lower legs 2/2 girth   Behavior During Therapy  [facility]FL for tasks assessed/performed       Past Medical History:  Diagnosis Date  . Diabetes mellitus without complication (HCC)   . Hypercholesteremia   . Hypertension     Past Surgical History:  Procedure Laterality Date  . BREAST EXCISIONAL BIOPSY Left 1970   negative  . COLONOSCOPY      There were no vitals filed for this visit.  Subjective Assessment - 12/23/18 0917    Subjective   [name] presents in transport wc for OT Rx day 5/ 36 to address BLE lymphedema . Pt presents with multilayer compression wrap applied by her sister who attended last appoi9ntment to learn gradient techniques. She is wearing smaller post op shoe. Pt states, "I feel pretty good today."    Pertinent History  Dx relevant to lymphedema (LE): HTN, DM, neuropathy, DJD, arthritis, sciatica,  chronic back pain, morbid obesity, (BMI 50-59.9)    Repetition  Increases Symptoms    Special Tests  strong + Stemmer sign at base of toes bilaterally    Pain Onset  Other (comment)   onset "years ago"                          OT Education - 12/23/18 1322    Education Details  Continued skilled Pt/caregiver education  And LE ADL training throughout visit for lymphedema self care/ home program,  including compression wrapping, compression garment and device wear/care, lymphatic pumping ther ex, simple self-MLD, and skin care. Discussed progress towards goals.    Person(s) Educated  Patient    Methods  Explanation;Demonstration;Tactile cues;Verbal cues;Handout    Comprehension  Verbalized understanding;Returned demonstration;Need further instruction          OT Long Term Goals - 12/09/18 1234      OT LONG TERM GOAL #1   Title  Pt will demonstrate understanding of lymphedema precautions and signs/ symptoms of cellulitisby identifying 6 items for each using printed Lymphedema Workbook for reference PRN (modified independence) to limit LE progression and infection risk.    Baseline  Max A    Time  4    Period  Days    Status  New      OT LONG TERM GOAL #2   Title  Pt will require Max assist from caregiver who will be able to apply multilayer, gradient compression wraps using correct techniques after skilled training to reduce limb volume and limity LE progression.    Baseline  dependent    Time  4    Period  Days    Status  New      OT LONG TERM GOAL #3  Title  Pt will achieve 10% limb volume reductions  bilaterally from ankle to tibial tuberosity during Intensive Phase CDT to return limbs to typical size and shape ,. reduce infection risk and improve basic andf instrumental ALDs performance.    Baseline  dependent    Time  12    Period  Weeks    Status  New      OT LONG TERM GOAL #4   Title  Pt will consistently perform all LE self -care home protocols ( simple self MLD, skin care, therapeutic exercise, compression therapies) daily  with max CG assistance to to achieve max edema reduction, to enanble functional improvements,  including fitting preferred street shoes and standard sized clothing, and to achieve increased AROM and reduced falls risk.    Baseline  dependent    Period  Weeks    Status  New      OT LONG TERM GOAL #5   Title  Patient will understand the  importance of weight management, increased activity level, and leg elevation for optimal control of limb swelling over time.    Baseline  dependent    Time  12    Period  Weeks    Status  New            Plan - 12/23/18 1323    Clinical Impression Statement  RLE volume reduction at medial thigh lobule is striking today. Pt was able to keep wras in place, which is promising in terms of considering thigh length garments vs knee length. Lobule area is also significantly less dense today by palpation. We wil continue to wrap this area in effort to reduce full leg volume optimally. Pt asked to wear skirts or loser trousers to clinic to enable access to regional inguinal LN during MLD, and to improve bandage placement.    OT Occupational Profile and History  Comprehensive Assessment- Review of records and extensive additional review of physical, cognitive, psychosocial history related to current functional performance    Occupational performance deficits (Please refer to evaluation for details):  ADL's;IADL's;Leisure;Social Participation;Rest and Sleep;Other;Work   Engineer, building services, productive activities, role performance   Body Structure / Function / Physical Skills  ADL;Obesity;Decreased knowledge of precautions;Balance;Body mechanics;Decreased knowledge of use of DME;Flexibility;IADL;Pain;Skin integrity;Strength;Edema;Gait;Mobility;ROM    Rehab Potential  Good    Clinical Decision Making  Multiple treatment options, significant modification of task necessary    Comorbidities Affecting Occupational Performance:  Presence of comorbidities impacting occupational performance    Comorbidities impacting occupational performance description:  see SUBJECTIVE    Modification or Assistance to Complete Evaluation   Max significant modification of tasks or assist is necessary to complete    OT Frequency  3x / week    OT Duration  12 weeks   consider reducing to 2 x weekly depending on level of and cinsistency of  CG support   OT Treatment/Interventions  Self-care/ADL training;Therapeutic exercise;DME and/or AE instruction;Therapist, nutritional;Compression bandaging;Other (comment);Manual Therapy;Patient/family education;Therapeutic activities;Manual lymph drainage;Energy conservation    Plan  Intensive Phase CDT= Manual lymphatic drainage, (MLD) skin care to improve tissue health, therapeutic exercise (lymphatic pumping ther ex) and gradient compression wrapping one leg at a time with short stretch compression bandages (knee length in Ms. Grave's case) . Pt edu for LE self care  is ongoing and intensive . Management Pgase CDT is on follow-up PRN basis to support management over time, replacing compression garments, etc.    Recommended Other Services  fit with BLE custom knee high compression  stockings, toe caps and convoluted foam HOS devices to limit fibrosis formation ( progression) during HOS    Consulted and Agree with Plan of Care  Patient       Patient will benefit from skilled therapeutic intervention in order to improve the following deficits and impairments:   Body Structure / Function / Physical Skills: ADL, Obesity, Decreased knowledge of precautions, Balance, Body mechanics, Decreased knowledge of use of DME, Flexibility, IADL, Pain, Skin integrity, Strength, Edema, Gait, Mobility, ROM       Visit Diagnosis: 1. Lymphedema, not elsewhere classified       Problem List There are no active problems to display for this patient.   Loel Dubonnetheresa Gaetana Kawahara, MS, OTR/L, [facility]-LANA 12/23/18 1:27 PM  [facility] [address] [place], [place], [address] Phone: [phone]   Fax:  [phone]  Name: Nancy FryBeatrice B Riley MRN: [id number] Date of Birth: 04-23-1956

## 2018-12-28 ENCOUNTER — Ambulatory Visit: Payer: Medicare Other | Admitting: Occupational Therapy

## 2018-12-28 ENCOUNTER — Other Ambulatory Visit: Payer: Self-pay

## 2018-12-28 DIAGNOSIS — I890 Lymphedema, not elsewhere classified: Secondary | ICD-10-CM | POA: Diagnosis not present

## 2018-12-28 NOTE — Therapy (Signed)
[facility] [address] [place], [place], [address] Phone: [phone]   Fax:  [phone]  Occupational Therapy Treatment  Patient Details  Name: Nancy Riley MRN: [id number] Date of Birth: [birth year] Referring Provider (OT): Nancy McleanLinda Miles, MD   Encounter Date: 12/28/2018  OT End of Session - 12/28/18 1310    Visit Number  7    Number of Visits  36    Date for OT Re-Evaluation  03/02/19    OT Start Time  1018    OT Stop Time  1118    OT Time Calculation (min)  60 min    Activity Tolerance  Patient tolerated treatment well;No increased pain   limited by body habitus. Unable to reach feet and lower legs 2/2 girth   Behavior During Therapy  [facility] for tasks assessed/performed       Past Medical History:  Diagnosis Date  . Diabetes mellitus without complication (HCC)   . Hypercholesteremia   . Hypertension     Past Surgical History:  Procedure Laterality Date  . BREAST EXCISIONAL BIOPSY Left 1970   negative  . COLONOSCOPY      There were no vitals filed for this visit.  Subjective Assessment - 12/28/18 1024    Subjective   Nancy Riley presents in transport wc for OT Rx day 6/ 36 to address BLE lymphedema . Pt presents with multilayer compression wrap in place below the knee. Pt reports knee pain 12/10 when weight bearing. "It took me 10 minutes yesterday to get from the house and in the car. Seems like I didn't have the energy in my legs to lift them."    Pertinent History  Dx relevant to lymphedema (LE): HTN, DM, neuropathy, DJD, arthritis, sciatica,  chronic back pain, morbid obesity, (BMI 50-59.9)    Repetition  Increases Symptoms    Special Tests  strong + Stemmer sign at base of toes bilaterally    Currently in Pain?  Yes    Pain Location  Knee    Pain Orientation  Right;Left    Pain Onset  Other (comment)   onset "years ago"                  OT Treatments/Exercises (OP) - 12/28/18 0001      ADLs   ADL Education Given  Yes  (Pended)       Manual Therapy   Manual Therapy  Edema management  (Pended)     Manual Lymphatic Drainage (MLD)  MLD to RLE using short neck sequence , deep diaphragmatic breathing and functional inguinal pathways.  (Pended)     Compression Bandaging  added 2 -12 cm wide short stretch wraps to eastablished knee length configuration to extend bandage above R knee to mid thigh  in effort to reduce medial thigh lobule.  (Pended)              OT Education - 12/28/18 1310    Education Details  Continued skilled Pt/caregiver education  And LE ADL training throughout visit for lymphedema self care/ home program, including compression wrapping, compression garment and device wear/care, lymphatic pumping ther ex, simple self-MLD, and skin care. Discussed progress towards goals.    Person(s) Educated  Patient    Methods  Explanation;Demonstration;Tactile cues;Verbal cues;Handout    Comprehension  Verbalized understanding;Returned demonstration;Need further instruction          OT Long Term Goals - 12/09/18 1234      OT LONG TERM GOAL #  1   Title  Pt will demonstrate understanding of lymphedema precautions and signs/ symptoms of cellulitisby identifying 6 items for each using printed Lymphedema Workbook for reference PRN (modified independence) to limit LE progression and infection risk.    Baseline  Max A    Time  4    Period  Days    Status  New      OT LONG TERM GOAL #2   Title  Pt will require Max assist from caregiver who will be able to apply multilayer, gradient compression wraps using correct techniques after skilled training to reduce limb volume and limity LE progression.    Baseline  dependent    Time  4    Period  Days    Status  New      OT LONG TERM GOAL #3   Title  Pt will achieve 10% limb volume reductions  bilaterally from ankle to tibial tuberosity during Intensive Phase CDT to return limbs to typical size and shape ,. reduce infection  risk and improve basic andf instrumental ALDs performance.    Baseline  dependent    Time  12    Period  Weeks    Status  New      OT LONG TERM GOAL #4   Title  Pt will consistently perform all LE self -care home protocols ( simple self MLD, skin care, therapeutic exercise, compression therapies) daily  with max CG assistance to to achieve max edema reduction, to enanble functional improvements,  including fitting preferred street shoes and standard sized clothing, and to achieve increased AROM and reduced falls risk.    Baseline  dependent    Period  Weeks    Status  New      OT LONG TERM GOAL #5   Title  Patient will understand the importance of weight management, increased activity level, and leg elevation for optimal control of limb swelling over time.    Baseline  dependent    Time  12    Period  Weeks    Status  New            Plan - 12/28/18 1310    Clinical Impression Statement  LLEmedial thigh lobule swollen to size prior to initially compression wrapping last week. Pt removed wraps from thigh and did not rewrap over the weekend. Discussed pros and cons of thigh vs knee length compression stockings, including fitting issues, complex multi part garments and fitting issues. We agreed to resume knee length wraps today. Pt tolerated all manual therapy, wrping to LLE and skin care. Cont as per POC.    OT Occupational Profile and History  Comprehensive Assessment- Review of records and extensive additional review of physical, cognitive, psychosocial history related to current functional performance    Occupational performance deficits (Please refer to evaluation for details):  ADL's;IADL's;Leisure;Social Participation;Rest and Sleep;Other;Work   Programme researcher, broadcasting/film/videobody image, productive activities, role performance   Body Structure / Function / Physical Skills  ADL;Obesity;Decreased knowledge of precautions;Balance;Body mechanics;Decreased knowledge of use of DME;Flexibility;IADL;Pain;Skin  integrity;Strength;Edema;Gait;Mobility;ROM    Rehab Potential  Good    Clinical Decision Making  Multiple treatment options, significant modification of task necessary    Comorbidities Affecting Occupational Performance:  Presence of comorbidities impacting occupational performance    Comorbidities impacting occupational performance description:  see SUBJECTIVE    Modification or Assistance to Complete Evaluation   Max significant modification of tasks or assist is necessary to complete    OT Frequency  3x / week  OT Duration  12 weeks   consider reducing to 2 x weekly depending on level of and cinsistency of CG support   OT Treatment/Interventions  Self-care/ADL training;Therapeutic exercise;DME and/or AE instruction;Therapist, nutritional;Compression bandaging;Other (comment);Manual Therapy;Patient/family education;Therapeutic activities;Manual lymph drainage;Energy conservation    Plan  Intensive Phase CDT= Manual lymphatic drainage, (MLD) skin care to improve tissue health, therapeutic exercise (lymphatic pumping ther ex) and gradient compression wrapping one leg at a time with short stretch compression bandages (knee length in Ms. Grave's case) . Pt edu for LE self care  is ongoing and intensive . Management Pgase CDT is on follow-up PRN basis to support management over time, replacing compression garments, etc.    Recommended Other Services  fit with BLE custom knee high compression stockings, toe caps and convoluted foam HOS devices to limit fibrosis formation ( progression) during HOS    Consulted and Agree with Plan of Care  Patient       Patient will benefit from skilled therapeutic intervention in order to improve the following deficits and impairments:   Body Structure / Function / Physical Skills: ADL, Obesity, Decreased knowledge of precautions, Balance, Body mechanics, Decreased knowledge of use of DME, Flexibility, IADL, Pain, Skin integrity, Strength, Edema, Gait, Mobility,  ROM       Visit Diagnosis: 1. Lymphedema, not elsewhere classified       Problem List There are no active problems to display for this patient.   Andrey Spearman, MS, OTR/L, CLT-LANA 12/28/18 1:14 PM    [place] MAIN [facility] SERVICES [address] [place], [place], [address] Phone: [phone]   Fax:  [phone]  Name: Nancy Riley MRN: [id number] Date of Birth: Jan 07, 1956

## 2018-12-30 ENCOUNTER — Ambulatory Visit: Payer: Medicare Other | Admitting: Occupational Therapy

## 2018-12-30 ENCOUNTER — Other Ambulatory Visit: Payer: Self-pay

## 2018-12-30 DIAGNOSIS — I890 Lymphedema, not elsewhere classified: Secondary | ICD-10-CM | POA: Diagnosis not present

## 2018-12-30 NOTE — Therapy (Signed)
[facility] [address] [place], [place], [address] Phone: [phone]   Fax:  [phone]  Occupational Therapy Treatment  Patient Details  Name: Nancy FryBeatrice B Riley MRN: [id number] Date of Birth: [birth year] Referring Provider (OT): Darreld McleanLinda Miles, MD   Encounter Date: 12/30/2018  OT End of Session - 12/30/18 1223    Visit Number  8    Number of Visits  36    Date for OT Re-Evaluation  03/02/19    OT Start Time  1005    OT Stop Time  1115    OT Time Calculation (min)  70 min    Activity Tolerance  Patient tolerated treatment well;No increased pain   limited by body habitus. Unable to reach feet and lower legs 2/2 girth   Behavior During Therapy  [facility] for tasks assessed/performed       Past Medical History:  Diagnosis Date  . Diabetes mellitus without complication (HCC)   . Hypercholesteremia   . Hypertension     Past Surgical History:  Procedure Laterality Date  . BREAST EXCISIONAL BIOPSY Left 1970   negative  . COLONOSCOPY      There were no vitals filed for this visit.  Subjective Assessment - 12/30/18 1011    Subjective   [name] presents in transport wc for OT Rx day 7/ 36 to address BLE lymphedema . Pt presents with multilayer compression wrap in place below the knee. Pt reports knee pain 10/10 when weight bearing. No other concerns.    Pertinent History  Dx relevant to lymphedema (LE): HTN, DM, neuropathy, DJD, arthritis, sciatica,  chronic back pain, morbid obesity, (BMI 50-59.9)    Repetition  Increases Symptoms    Special Tests  strong + Stemmer sign at base of toes bilaterally    Currently in Pain?  Yes    Pain Onset  Other (comment)   onset "years ago"                  OT Treatments/Exercises (OP) - 12/30/18 0001      Manual Therapy   Manual Therapy  Edema management;Manual Lymphatic Drainage (MLD);Compression Bandaging    Manual Lymphatic Drainage (MLD)  MLD to RLE using short neck  sequence , deep diaphragmatic breathing and functional inguinal pathways.    Compression Bandaging  added custom fabricated, chipped foam muff to distal leg over stockinette and foam in effort to reduce fibrosis. Applied remaining short stretch wraps as established.             OT Education - 12/30/18 1222    Education Details  Continued skilled Pt/caregiver education  And LE ADL training throughout visit for lymphedema self care/ home program, including compression wrapping, compression garment and device wear/care, lymphatic pumping ther ex, simple self-MLD, and skin care. Discussed progress towards goals.    Person(s) Educated  Patient    Methods  Explanation;Demonstration;Tactile cues;Verbal cues;Handout    Comprehension  Verbalized understanding;Returned demonstration;Need further instruction          OT Long Term Goals - 12/09/18 1234      OT LONG TERM GOAL #1   Title  Pt will demonstrate understanding of lymphedema precautions and signs/ symptoms of cellulitisby identifying 6 items for each using printed Lymphedema Workbook for reference PRN (modified independence) to limit LE progression and infection risk.    Baseline  Max A    Time  4    Period  Days    Status  New  OT LONG TERM GOAL #2   Title  Pt will require Max assist from caregiver who will be able to apply multilayer, gradient compression wraps using correct techniques after skilled training to reduce limb volume and limity LE progression.    Baseline  dependent    Time  4    Period  Days    Status  New      OT LONG TERM GOAL #3   Title  Pt will achieve 10% limb volume reductions  bilaterally from ankle to tibial tuberosity during Intensive Phase CDT to return limbs to typical size and shape ,. reduce infection risk and improve basic andf instrumental ALDs performance.    Baseline  dependent    Time  12    Period  Weeks    Status  New      OT LONG TERM GOAL #4   Title  Pt will consistently perform all  LE self -care home protocols ( simple self MLD, skin care, therapeutic exercise, compression therapies) daily  with max CG assistance to to achieve max edema reduction, to enanble functional improvements,  including fitting preferred street shoes and standard sized clothing, and to achieve increased AROM and reduced falls risk.    Baseline  dependent    Period  Weeks    Status  New      OT LONG TERM GOAL #5   Title  Patient will understand the importance of weight management, increased activity level, and leg elevation for optimal control of limb swelling over time.    Baseline  dependent    Time  12    Period  Weeks    Status  New            Plan - 12/30/18 1223    Clinical Impression Statement  Pt tolerated all aspects of CDT to LLE without difficulty today including LLE MLD,  short stretch gradient wrapping and skin care. Added custom fabricated chipped foam muff to distal leg in effort to decrease dense fatty fibrosis in this area. Cont as per POC.    OT Occupational Profile and History  Comprehensive Assessment- Review of records and extensive additional review of physical, cognitive, psychosocial history related to current functional performance    Occupational performance deficits (Please refer to evaluation for details):  ADL's;IADL's;Leisure;Social Participation;Rest and Sleep;Other;Work   Programme researcher, broadcasting/film/videobody image, productive activities, role performance   Body Structure / Function / Physical Skills  ADL;Obesity;Decreased knowledge of precautions;Balance;Body mechanics;Decreased knowledge of use of DME;Flexibility;IADL;Pain;Skin integrity;Strength;Edema;Gait;Mobility;ROM    Rehab Potential  Good    Clinical Decision Making  Multiple treatment options, significant modification of task necessary    Comorbidities Affecting Occupational Performance:  Presence of comorbidities impacting occupational performance    Comorbidities impacting occupational performance description:  see SUBJECTIVE     Modification or Assistance to Complete Evaluation   Max significant modification of tasks or assist is necessary to complete    OT Frequency  3x / week    OT Duration  12 weeks   consider reducing to 2 x weekly depending on level of and cinsistency of CG support   OT Treatment/Interventions  Self-care/ADL training;Therapeutic exercise;DME and/or AE instruction;Building services engineerunctional Mobility Training;Compression bandaging;Other (comment);Manual Therapy;Patient/family education;Therapeutic activities;Manual lymph drainage;Energy conservation    Plan  Intensive Phase CDT= Manual lymphatic drainage, (MLD) skin care to improve tissue health, therapeutic exercise (lymphatic pumping ther ex) and gradient compression wrapping one leg at a time with short stretch compression bandages (knee length in Ms. Grave's case) . Pt edu  for LE self care  is ongoing and intensive . Management Pgase CDT is on follow-up PRN basis to support management over time, replacing compression garments, etc.    Recommended Other Services  fit with BLE custom knee high compression stockings, toe caps and convoluted foam HOS devices to limit fibrosis formation ( progression) during HOS    Consulted and Agree with Plan of Care  Patient       Patient will benefit from skilled therapeutic intervention in order to improve the following deficits and impairments:   Body Structure / Function / Physical Skills: ADL, Obesity, Decreased knowledge of precautions, Balance, Body mechanics, Decreased knowledge of use of DME, Flexibility, IADL, Pain, Skin integrity, Strength, Edema, Gait, Mobility, ROM       Visit Diagnosis: 1. Lymphedema, not elsewhere classified       Problem List There are no active problems to display for this patient.  Andrey Spearman, MS, OTR/L, [facility] 12/30/18 12:31 PM   [place] MAIN [facility] SERVICES [address] [place], [place], [address] Phone: [phone]   Fax:   [phone]  Name: Nancy Riley MRN: [id number] Date of Birth: 1956/01/03

## 2018-12-31 ENCOUNTER — Ambulatory Visit: Payer: Medicare Other | Admitting: Occupational Therapy

## 2018-12-31 ENCOUNTER — Other Ambulatory Visit: Payer: Self-pay

## 2018-12-31 DIAGNOSIS — I890 Lymphedema, not elsewhere classified: Secondary | ICD-10-CM | POA: Diagnosis not present

## 2018-12-31 NOTE — Therapy (Signed)
[place] [facility] [address] [place], [place], [address] Phone: [phone]   Fax:  [phone]  Occupational Therapy Treatment  Patient Details  Name: Nancy FryBeatrice B Riley MRN: [id number] Date of Birth: 1955-10-23 Referring Provider (OT): Darreld McleanLinda Miles, MD   Encounter Date: 12/31/2018  OT End of Session - 12/31/18 1257    Visit Number  9    Number of Visits  36    Date for OT Re-Evaluation  03/02/19    OT Start Time  0910    OT Stop Time  1020    OT Time Calculation (min)  70 min    Activity Tolerance  Patient tolerated treatment well;No increased pain   limited by body habitus. Unable to reach feet and lower legs 2/2 girth   Behavior During Therapy  [facility] for tasks assessed/performed       Past Medical History:  Diagnosis Date  . Diabetes mellitus without complication (HCC)   . Hypercholesteremia   . Hypertension     Past Surgical History:  Procedure Laterality Date  . BREAST EXCISIONAL BIOPSY Left 1970   negative  . COLONOSCOPY      There were no vitals filed for this visit.  Subjective Assessment - 12/31/18 0916    Subjective   Nancy Riley presents in transport wc for OT Rx day 8/ 36 to address BLE lymphedema . Pt presents with multilayer compression wrap in place below the knee. Pt reports knee pain 10/10 again today.    Pertinent History  Dx relevant to lymphedema (LE): HTN, DM, neuropathy, DJD, arthritis, sciatica,  chronic back pain, morbid obesity, (BMI 50-59.9)    Repetition  Increases Symptoms    Special Tests  strong + Stemmer sign at base of toes bilaterally    Pain Onset  Other (comment)   onset "years ago"         LYMPHEDEMA/ONCOLOGY QUESTIONNAIRE - 12/31/18 0940      Right Lower Extremity Lymphedema   Other  RLE limb volume measures 6645.89 ml today.     Other  RLE limb volume REDUCTION from ankle to tibial tuberosity  meaaures 26.73%.              OT Treatments/Exercises (OP) - 12/31/18 0001       ADLs   ADL Education Given  Yes      Manual Therapy   Manual Therapy  Edema management;Manual Lymphatic Drainage (MLD);Compression Bandaging    Manual therapy comments  comparative limb volumetrics RLE A-D    Edema Management  skin care as establlished    Manual Lymphatic Drainage (MLD)  MLD to RLE using short neck sequence , deep diaphragmatic breathing and functional inguinal pathways.             OT Education - 12/31/18 [phone]    Education Details  Continued skilled Pt/caregiver education  And LE ADL training throughout visit for lymphedema self care/ home program, including compression wrapping, compression garment and device wear/care, lymphatic pumping ther ex, simple self-MLD, and skin care. Discussed progress towards goals.    Person(s) Educated  Patient    Methods  Explanation;Demonstration;Tactile cues;Verbal cues;Handout    Comprehension  Verbalized understanding;Returned demonstration;Need further instruction          OT Long Term Goals - 12/09/18 1234      OT LONG TERM GOAL #1   Title  Pt will demonstrate understanding of lymphedema precautions and signs/ symptoms of cellulitisby identifying 6 items for each using printed Lymphedema  Workbook for reference PRN (modified independence) to limit LE progression and infection risk.    Baseline  Max A    Time  4    Period  Days    Status  New      OT LONG TERM GOAL #2   Title  Pt will require Max assist from caregiver who will be able to apply multilayer, gradient compression wraps using correct techniques after skilled training to reduce limb volume and limity LE progression.    Baseline  dependent    Time  4    Period  Days    Status  New      OT LONG TERM GOAL #3   Title  Pt will achieve 10% limb volume reductions  bilaterally from ankle to tibial tuberosity during Intensive Phase CDT to return limbs to typical size and shape ,. reduce infection risk and improve basic andf instrumental ALDs performance.     Baseline  dependent    Time  12    Period  Weeks    Status  New      OT LONG TERM GOAL #4   Title  Pt will consistently perform all LE self -care home protocols ( simple self MLD, skin care, therapeutic exercise, compression therapies) daily  with max CG assistance to to achieve max edema reduction, to enanble functional improvements,  including fitting preferred street shoes and standard sized clothing, and to achieve increased AROM and reduced falls risk.    Baseline  dependent    Period  Weeks    Status  New      OT LONG TERM GOAL #5   Title  Patient will understand the importance of weight management, increased activity level, and leg elevation for optimal control of limb swelling over time.    Baseline  dependent    Time  12    Period  Weeks    Status  New            Plan - 12/31/18 2426    Clinical Impression Statement  Pt tolerated all aspects of CDT to LLE without difficulty today including comparative limb volumetrics,  LLE MLD,  short stretch gradient wrapping and skin care. Custom muff included with wraps at distal leg and ankle last visit did mildly decrease density of fibrosis; however, due to slight areas of redness we excluded this device from wraps today to give skin a chance to recover. RLE limb volume REDUCTION from ankle to tibial tuberosity  meaaures 26.73%.This value meets and exceeds the initial limb volume reduction goal of 10 % below the knee set initiall. Pt has made excellent progress towards all goals in a relatively short time. Cont as per POC>    OT Occupational Profile and History  Comprehensive Assessment- Review of records and extensive additional review of physical, cognitive, psychosocial history related to current functional performance    Occupational performance deficits (Please refer to evaluation for details):  ADL's;IADL's;Leisure;Social Participation;Rest and Sleep;Other;Work   Engineer, building services, productive activities, role performance   Body Structure  / Function / Physical Skills  ADL;Obesity;Decreased knowledge of precautions;Balance;Body mechanics;Decreased knowledge of use of DME;Flexibility;IADL;Pain;Skin integrity;Strength;Edema;Gait;Mobility;ROM    Rehab Potential  Good    Clinical Decision Making  Multiple treatment options, significant modification of task necessary    Comorbidities Affecting Occupational Performance:  Presence of comorbidities impacting occupational performance    Comorbidities impacting occupational performance description:  see SUBJECTIVE    Modification or Assistance to Complete Evaluation   Max significant modification  of tasks or assist is necessary to complete    OT Frequency  3x / week    OT Duration  12 weeks   consider reducing to 2 x weekly depending on level of and cinsistency of CG support   OT Treatment/Interventions  Self-care/ADL training;Therapeutic exercise;DME and/or AE instruction;Building services engineerunctional Mobility Training;Compression bandaging;Other (comment);Manual Therapy;Patient/family education;Therapeutic activities;Manual lymph drainage;Energy conservation    Plan  Intensive Phase CDT= Manual lymphatic drainage, (MLD) skin care to improve tissue health, therapeutic exercise (lymphatic pumping ther ex) and gradient compression wrapping one leg at a time with short stretch compression bandages (knee length in Ms. Grave's case) . Pt edu for LE self care  is ongoing and intensive . Management Pgase CDT is on follow-up PRN basis to support management over time, replacing compression garments, etc.    Recommended Other Services  fit with BLE custom knee high compression stockings, toe caps and convoluted foam HOS devices to limit fibrosis formation ( progression) during HOS    Consulted and Agree with Plan of Care  Patient       Patient will benefit from skilled therapeutic intervention in order to improve the following deficits and impairments:   Body Structure / Function / Physical Skills: ADL, Obesity, Decreased  knowledge of precautions, Balance, Body mechanics, Decreased knowledge of use of DME, Flexibility, IADL, Pain, Skin integrity, Strength, Edema, Gait, Mobility, ROM       Visit Diagnosis: 1. Lymphedema, not elsewhere classified       Problem List There are no active problems to display for this patient.  Loel Dubonnetheresa Juwon Scripter, MS, OTR/L, [facility] 12/31/18 1:03 PM  [facility] [address] [place], [place], [address] Phone: [phone]   Fax:  [phone]  Name: Nancy FryBeatrice B Riley MRN: [id number] Date of Birth: September 28, 1955

## 2019-01-02 ENCOUNTER — Encounter: Payer: Self-pay | Admitting: Emergency Medicine

## 2019-01-02 ENCOUNTER — Emergency Department
Admission: EM | Admit: 2019-01-02 | Discharge: 2019-01-02 | Disposition: A | Discharge status: Home / Self Care | Payer: Medicare Other | Attending: Emergency Medicine | Admitting: Emergency Medicine

## 2019-01-02 ENCOUNTER — Emergency Department: Payer: Medicare Other

## 2019-01-02 ENCOUNTER — Other Ambulatory Visit: Payer: Self-pay

## 2019-01-02 DIAGNOSIS — Z7982 Long term (current) use of aspirin: Secondary | ICD-10-CM | POA: Diagnosis not present

## 2019-01-02 DIAGNOSIS — E782 Mixed hyperlipidemia: Secondary | ICD-10-CM | POA: Diagnosis not present

## 2019-01-02 DIAGNOSIS — Z7984 Long term (current) use of oral hypoglycemic drugs: Secondary | ICD-10-CM | POA: Insufficient documentation

## 2019-01-02 DIAGNOSIS — I10 Essential (primary) hypertension: Secondary | ICD-10-CM | POA: Diagnosis not present

## 2019-01-02 DIAGNOSIS — M545 Low back pain: Secondary | ICD-10-CM | POA: Diagnosis present

## 2019-01-02 DIAGNOSIS — E119 Type 2 diabetes mellitus without complications: Secondary | ICD-10-CM | POA: Insufficient documentation

## 2019-01-02 DIAGNOSIS — G8929 Other chronic pain: Secondary | ICD-10-CM | POA: Diagnosis not present

## 2019-01-02 DIAGNOSIS — Z79899 Other long term (current) drug therapy: Secondary | ICD-10-CM | POA: Diagnosis not present

## 2019-01-02 MED ORDER — HYDROCODONE-ACETAMINOPHEN 5-325 MG PO TABS: 1.0000 | ORAL_TABLET | ORAL | 0 refills | Status: DC | PRN

## 2019-01-02 MED ORDER — HYDROCODONE-ACETAMINOPHEN 5-325 MG PO TABS
1.0000 | ORAL_TABLET | ORAL | Status: AC
  Administered 2019-01-02: 22:00:00 1 via ORAL
  Filled 2019-01-02: qty 1

## 2019-01-02 MED ORDER — PREDNISONE 10 MG PO TABS: 10.0000 mg | ORAL_TABLET | Freq: Every day | ORAL | 0 refills | Status: DC

## 2019-01-02 NOTE — ED Notes (Signed)
Patient taken to imaging. 

## 2019-01-02 NOTE — Discharge Instructions (Addendum)
Please take Norco as needed for severe pain.  Take prednisone as prescribed.  Return to the ER for any worsening symptoms or urgent changes in health.  Follow-up with spine specialist.

## 2019-01-02 NOTE — ED Notes (Signed)
Patient to waiting room via EMS.  Per EMS patient complains of back pain 10/10.  Reports hx of ddd pain worse since yesterday.  Reports enroute began complaining of left leg pain, pain better with repositioning.  EMS vital signs:  HR 94, BP 106/40, pulse oxi 97, rr 17.

## 2019-01-02 NOTE — ED Triage Notes (Signed)
Patient brought in by ems from home. Patient states that she developed lower back pain yesterday. Patient states that she has had similar pain in the past and was told that she has degenerative disc disease. Patient states that she has taken 800 mg IBU with no improvement.

## 2019-01-02 NOTE — ED Provider Notes (Signed)
[facility] EMERGENCY DEPARTMENT Provider Note   CSN: [id number] Arrival date & time: 01/02/19  1913    History   Chief Complaint Chief Complaint  Patient presents with  . Back Pain    HPI Nancy Riley is a 63 y.o. female.  Presents the emergency department for evaluation of acute on chronic low back pain.  She denies any trauma or injury.  She has a history of degenerative disc disease.  She denies any radicular symptoms or lower extremity pain.  She points to pain along the right lower back on the lumbosacral junction.  No abdominal pain, fevers, nausea, vomiting, urinary symptoms.  She was given tramadol but states this has not been helping.  She is also had some ibuprofen with no improvement.  No history of compression fractures.  She suffers from lymphedema.     HPI  Past Medical History:  Diagnosis Date  . Diabetes mellitus without complication ([place])   . Hypercholesteremia   . Hypertension     There are no active problems to display for this patient.   Past Surgical History:  Procedure Laterality Date  . BREAST EXCISIONAL BIOPSY Left 1970   negative  . COLONOSCOPY       OB History    Gravida  2   Para  2   Term      Preterm      AB      Living        SAB      TAB      Ectopic      Multiple      Live Births           Obstetric Comments  1st Menstrual Cycle:  14 1st Pregnancy:  22          Home Medications    Prior to Admission medications   Medication Sig Start Date End Date Taking? Authorizing Provider  aspirin EC 81 MG tablet Take 81 mg by mouth daily.    [provider]  celecoxib (CELEBREX) 200 MG capsule Take 200 mg by mouth 2 (two) times daily with a meal.    [provider]  cyclobenzaprine (FLEXERIL) 5 MG tablet Take 5 mg by mouth 2 (two) times daily as needed for muscle spasms.    [provider]  gabapentin (NEURONTIN) 100 MG capsule Take 100 mg by mouth 3 (three) times  daily.    [provider]  hydrochlorothiazide (HYDRODIURIL) 25 MG tablet Take 25 mg by mouth daily.    [provider]  ketorolac (TORADOL) 10 MG tablet Take 1 tablet (10 mg total) by mouth every 8 (eight) hours. 10/18/18   Menshew, Dannielle Karvonen, PA-C  lisinopril (PRINIVIL,ZESTRIL) 10 MG tablet Take 10 mg by mouth daily.    [provider]  loratadine (CLARITIN) 10 MG tablet Take 10 mg by mouth daily.    [provider]  lovastatin (MEVACOR) 20 MG tablet Take 20 mg by mouth every evening.    [provider]  metFORMIN (GLUCOPHAGE-XR) 500 MG 24 hr tablet Take 500 mg by mouth daily.    [provider]  naproxen sodium (ALEVE) 220 MG tablet Take 220 mg by mouth daily as needed (pain).    [provider]  potassium chloride (MICRO-K) 10 MEQ CR capsule Take 10 mEq by mouth daily.    [provider]  ranitidine (ZANTAC) 150 MG tablet Take 150 mg by mouth 2 (two) times daily.  [provider]  traMADol (ULTRAM) 50 MG tablet Take 1 tablet (50 mg total) by mouth 2 (two) times daily. 10/18/18   Menshew, Charlesetta IvoryJenise V Bacon, PA-C    Family History Family History  Problem Relation Age of Onset  . Breast cancer Neg Hx   . Colon cancer Neg Hx     Social History Social History   Tobacco Use  . Smoking status: Never Smoker  . Smokeless tobacco: Never Used  Substance Use Topics  . Alcohol use: Not Currently  . Drug use: Never     Allergies   Patient has no known allergies.   Review of Systems Review of Systems  Constitutional: Negative for activity change and fever.  Eyes: Negative for pain and visual disturbance.  Respiratory: Negative for shortness of breath.   Cardiovascular: Negative for chest pain and leg swelling.  Gastrointestinal: Negative for abdominal pain.  Genitourinary: Negative for dysuria, flank pain and pelvic pain.  Musculoskeletal: Positive for back pain. Negative for arthralgias, gait problem,  joint swelling, myalgias, neck pain and neck stiffness.  Skin: Negative for wound.  Neurological: Negative for dizziness, syncope, weakness, light-headedness, numbness and headaches.  Psychiatric/Behavioral: Negative for confusion and decreased concentration.     Physical Exam Updated Vital Signs BP 122/67   Pulse 87   Temp 98.4 F (36.9 C) (Oral)   Resp 18   Ht 5\' 3"  (1.6 m)   Wt 130.2 kg   SpO2 98%   BMI 50.84 kg/m   Physical Exam Constitutional:      Appearance: She is well-developed.  HENT:     Head: Normocephalic and atraumatic.  Eyes:     Conjunctiva/sclera: Conjunctivae normal.  Neck:     Musculoskeletal: Normal range of motion.  Cardiovascular:     Rate and Rhythm: Normal rate.  Pulmonary:     Effort: Pulmonary effort is normal. No respiratory distress.  Musculoskeletal: Normal range of motion.     Comments: Lumbar spine shows mild tenderness along lumbosacral junction with minimal right paravertebral muscle tenderness.  No flank tenderness.  No warmth or redness.  Good range of motion of both hips with no discomfort.  Significant lymphedema both lower extremities.  No asymmetrical swelling.  Sensation intact in both lower extremities.  Skin:    General: Skin is warm.     Findings: No rash.  Neurological:     Mental Status: She is alert and oriented to person, place, and time.  Psychiatric:        Behavior: Behavior normal.        Thought Content: Thought content normal.      ED Treatments / Results  Labs (all labs ordered are listed, but only abnormal results are displayed) Labs Reviewed - No data to display  EKG None  Radiology Dg Lumbar Spine 2-3 Views  Result Date: 01/02/2019 CLINICAL DATA:  Acute back pain EXAM: LUMBAR SPINE - 2-3 VIEW COMPARISON:  04/02/2014 FINDINGS: Chronic anterolisthesis of L4 on L5 is again noted, similar to previous exam. There is been progressive disc space narrowing, subchondral sclerosis and ventral endplate spurring at  the L2-3 level. The vertebral body heights are well preserved. No acute fractures identified. IMPRESSION: 1. Progressive degenerative disc disease at L2-3. 2. Chronic L4-5 anterolisthesis. Electronically Signed   By: Signa Kellaylor  Stroud M.D.   On: 01/02/2019 22:01    Procedures Procedures (including critical care time)  Medications Ordered in ED Medications  HYDROcodone-acetaminophen (NORCO/VICODIN) 5-325 MG per tablet 1 tablet (1 tablet Oral Given 01/02/19  2133)     Initial Impression / Assessment and Plan / ED Course  I have reviewed the triage vital signs and the nursing notes.  Pertinent labs & imaging results that were available during my care of the patient were reviewed by me and considered in my medical decision making (see chart for details).        63 year old female with acute on chronic low back pain, she has progressive degenerative disc disease with spondylolisthesis.  Significant lower lumbar facet arthritis.  No neurological deficits.  No evidence of acute compression fracture.  No urinary symptoms vital signs are stable.  Pain well controlled with Norco.  She is given a prescription for prednisone and Norco will follow-up with spine clinic who has recommended her try ESI injections.  Final Clinical Impressions(s) / ED Diagnoses   Final diagnoses:  None    ED Discharge Orders    None       Ronnette JuniperGaines, Chelsy Parrales C, PA-C 01/02/19 2244    Shaune PollackIsaacs, Cameron, MD 01/08/19 [phone]

## 2019-01-02 NOTE — ED Notes (Signed)
Pt unable to sign e-signature pad due to power outage at the hospital. Pt educated about discharge instructions and verbalized understanding. Pt departed with family.

## 2019-01-04 ENCOUNTER — Other Ambulatory Visit: Payer: Self-pay

## 2019-01-04 ENCOUNTER — Ambulatory Visit: Payer: Medicare Other | Attending: Family Medicine | Admitting: Occupational Therapy

## 2019-01-04 DIAGNOSIS — I890 Lymphedema, not elsewhere classified: Secondary | ICD-10-CM | POA: Diagnosis present

## 2019-01-04 NOTE — Therapy (Signed)
[place] MAIN [facility] SERVICES [address] [place], [place], [address] Phone: [phone]   Fax:  [phone]  Occupational Therapy Treatment Note and Progress Report  Patient Details  Name: Nancy Riley MRN: [id number] Date of Birth: Aug 15, 1955 Referring Provider (OT): Delight Stare, MD   Encounter Date: 01/04/2019  OT End of Session - 01/04/19 1225    Visit Number  10    Number of Visits  36    Date for OT Re-Evaluation  03/02/19    OT Start Time  1005    OT Stop Time  1100    OT Time Calculation (min)  55 min    Activity Tolerance  Patient tolerated treatment well;No increased pain   limited by body habitus. Unable to reach feet and lower legs 2/2 girth   Behavior During Therapy  [facility] for tasks assessed/performed       Past Medical History:  Diagnosis Date  . Diabetes mellitus without complication ([place])   . Hypercholesteremia   . Hypertension     Past Surgical History:  Procedure Laterality Date  . BREAST EXCISIONAL BIOPSY Left 1970   negative  . COLONOSCOPY      There were no vitals filed for this visit.  Subjective Assessment - 01/04/19 1005    Subjective   [name] presents in transport wc for OT Rx day 9/ 36 to address BLE lymphedema . Pt presents with multilayer compression wrap in place below the knee. Pt reports she spent several hours at [facility] ED due to back pain. Pt was told she may now ber a candidate for steroid injections Pt reports burning sensation in L leg when knee was fully extended while riding in ambulance. Shwe reports 4th and 5th digits on L foot "are always numb". Pt plans to call [facility] physician who has assisted her in the past to request a consult..    Pertinent History  Dx relevant to lymphedema (LE): HTN, DM, neuropathy, DJD, arthritis, sciatica,  chronic back pain, morbid obesity, (BMI 50-59.9)    Repetition  Increases Symptoms    Special Tests  strong + Stemmer sign at base of toes bilaterally    Currently  in Pain?  Yes    Pain Score  8     Pain Location  Knee    Pain Orientation  Right;Left    Pain Descriptors / Indicators  Aching;Discomfort;Pressure;Tightness;Sore;Heaviness;Pins and needles    Pain Type  Chronic pain    Pain Onset  Other (comment)   onset "years ago"   Pain Frequency  Intermittent                   OT Treatments/Exercises (OP) - 01/04/19 0001      ADLs   ADL Education Given  Yes      Manual Therapy   Manual Therapy  Edema management;Manual Lymphatic Drainage (MLD);Compression Bandaging    Edema Management  skin care as establlished    Manual Lymphatic Drainage (MLD)  MLD to RLE using short neck sequence , deep diaphragmatic breathing and functional inguinal pathways.    Compression Bandaging  No muff today . Applied remaining short stretch wraps as established.             OT Education - 01/04/19 1225    Education Details  Continued skilled Pt/caregiver education  And LE ADL training throughout visit for lymphedema self care/ home program, including compression wrapping, compression garment and device wear/care, lymphatic pumping ther ex, simple self-MLD, and  skin care. Discussed progress towards goals.    Person(s) Educated  Patient    Methods  Explanation;Demonstration;Tactile cues;Verbal cues;Handout    Comprehension  Verbalized understanding;Returned demonstration;Need further instruction          OT Long Term Goals - 01/04/19 1225      OT LONG TERM GOAL #1   Title  Pt will demonstrate understanding of lymphedema precautions and signs/ symptoms of cellulitisby identifying 6 items for each using printed Lymphedema Workbook for reference PRN (modified independence) to limit LE progression and infection risk.    Baseline  Max A    Time  4    Period  Days    Status  Achieved      OT LONG TERM GOAL #2   Title  Pt will require Max assist from caregiver who will be able to apply multilayer, gradient compression wraps using correct  techniques after skilled training to reduce limb volume and limity LE progression.    Baseline  dependent    Time  4    Period  Days    Status  Achieved      OT LONG TERM GOAL #3   Title  Pt will achieve 10% limb volume reductions  bilaterally from ankle to tibial tuberosity during Intensive Phase CDT to return limbs to typical size and shape ,. reduce infection risk and improve basic andf instrumental ALDs performance.    Baseline  dependent  01/04/19: achieved 12/31/18 for RLE with 26.73% limb volume reduction below the knee.    Time  12    Period  Weeks    Status  Partially Met      OT LONG TERM GOAL #4   Title  Pt will consistently perform all LE self -care home protocols ( simple self MLD, skin care, therapeutic exercise, compression therapies) daily  with max CG assistance to to achieve max edema reduction, to enanble functional improvements,  including fitting preferred street shoes and standard sized clothing, and to achieve increased AROM and reduced falls risk.    Baseline  dependent 01/04/19: Pt unable to reach feet and distal legs to perform self MLD due to AROM limited by body habitus. Pt will benefit from Flexitouch sequential pneumatic compression device ("pump") for long term LE self management.    Period  Weeks    Status  Partially Met      OT LONG TERM GOAL #5   Title  Patient will understand the importance of weight management, increased activity level, and leg elevation for optimal control of limb swelling over time.    Baseline  dependent    Time  12    Period  Weeks    Status  Partially Met              Patient will benefit from skilled therapeutic intervention in order to improve the following deficits and impairments:           Visit Diagnosis: 1. Lymphedema, not elsewhere classified       Problem List There are no active problems to display for this patient.   Andrey Spearman, MS, OTR/L, [facility] 01/04/19 12:34 PM  [place] MAIN [facility] SERVICES [address] [place], [place], [address] Phone: [phone]   Fax:  [phone]  Name: Nancy Riley MRN: [id number] Date of Birth: 1956/04/25

## 2019-01-06 ENCOUNTER — Other Ambulatory Visit: Payer: Self-pay

## 2019-01-06 ENCOUNTER — Ambulatory Visit: Payer: Medicare Other | Admitting: Occupational Therapy

## 2019-01-06 DIAGNOSIS — I890 Lymphedema, not elsewhere classified: Secondary | ICD-10-CM

## 2019-01-06 NOTE — Therapy (Signed)
[place] MAIN [facility] SERVICES [address] [place], [place], [address] Phone: [phone]   Fax:  [phone]  Patient Details  Name: Nancy Riley MRN: [id number] Date of Birth: [birth year] Referring Provider:  Marguerita Merles, MD  Encounter Date: 01/06/2019   Andrey Spearman, MS, OTR/L, [facility] 01/06/19 12:47 PM  [place] MAIN [facility] SERVICES [address] [place], [place], [address] Phone: [phone]   Fax:  [phone]

## 2019-01-07 ENCOUNTER — Ambulatory Visit: Payer: Medicare Other | Admitting: Occupational Therapy

## 2019-01-07 ENCOUNTER — Other Ambulatory Visit: Payer: Self-pay

## 2019-01-07 DIAGNOSIS — I890 Lymphedema, not elsewhere classified: Secondary | ICD-10-CM | POA: Diagnosis not present

## 2019-01-07 NOTE — Therapy (Signed)
[place] MAIN [facility] SERVICES [address] [place], [place], [address] Phone: [phone]   Fax:  [phone]  Occupational Therapy Treatment  Patient Details  Name: Nancy Riley MRN: [id number] Date of Birth: 05-01-1956 Referring Provider (OT): Delight Stare, MD   Encounter Date: 01/07/2019  OT End of Session - 01/07/19 1209    Visit Number  12    Number of Visits  36    Date for OT Re-Evaluation  03/02/19    OT Start Time  0900    OT Stop Time  1000    OT Time Calculation (min)  60 min    Activity Tolerance  Patient tolerated treatment well;No increased pain   limited by body habitus. Unable to reach feet and lower legs 2/2 girth   Behavior During Therapy  [facility] for tasks assessed/performed       Past Medical History:  Diagnosis Date  . Diabetes mellitus without complication ([place])   . Hypercholesteremia   . Hypertension     Past Surgical History:  Procedure Laterality Date  . BREAST EXCISIONAL BIOPSY Left 1970   negative  . COLONOSCOPY      There were no vitals filed for this visit.  Subjective Assessment - 01/07/19 0903    Subjective   [name] presents in transport wc for OT Rx day 12/ 36 to address BLE lymphedema . Pt presents with multilayer compression wrap in place below the knee. Pt has no new complaints today. OA knee pain unchanged snice vist yesterday.    Pertinent History  Dx relevant to lymphedema (LE): HTN, DM, neuropathy, DJD, arthritis, sciatica,  chronic back pain, morbid obesity, (BMI 50-59.9)    Repetition  Increases Symptoms    Special Tests  strong + Stemmer sign at base of toes bilaterally    Pain Onset  Other (comment)   onset "years ago"                  OT Treatments/Exercises (OP) - 01/07/19 0001      ADLs   ADL Education Given  Yes      Manual Therapy   Manual Therapy  Edema management;Manual Lymphatic Drainage (MLD);Compression Bandaging    Edema Management  skin care as  establlished    Manual Lymphatic Drainage (MLD)  MLD to RLE using short neck sequence , deep diaphragmatic breathing and functional inguinal pathways.    Compression Bandaging  No muff today . Applied remaining short stretch wraps as established.             OT Education - 01/07/19 1211    Education Details  Pt edu re importance of caring for skin to limit infection risk, and performing daily skin inspection on feet and legs in keeping w/ DM precautions. Pt made plan to have gransdson apply lotion daily since she is unable to reach or inspects feet and distal legs.    Person(s) Educated  Patient    Methods  Explanation;Demonstration;Tactile cues;Verbal cues;Handout    Comprehension  Verbalized understanding;Returned demonstration;Need further instruction          OT Long Term Goals - 01/06/19 1019      OT LONG TERM GOAL #1   Title  Pt will demonstrate understanding of lymphedema precautions and signs/ symptoms of cellulitisby identifying 6 items for each using printed Lymphedema Workbook for reference PRN (modified independence) to limit LE progression and infection risk.    Baseline  Max A    Time  4  Period  Days    Status  Achieved      OT LONG TERM GOAL #2   Title  Pt will require Max assist from caregiver who will be able to apply multilayer, gradient compression wraps using correct techniques after skilled training to reduce limb volume and limity LE progression.    Baseline  dependent    Time  4    Period  Days    Status  Achieved      OT LONG TERM GOAL #3   Title  Pt will achieve 10% limb volume reductions  bilaterally from ankle to tibial tuberosity during Intensive Phase CDT to return limbs to typical size and shape ,. reduce infection risk and improve basic andf instrumental ALDs performance.    Baseline  dependent  01/04/19: achieved 12/31/18 for RLE with 26.73% limb volume reduction below the knee.    Time  12    Period  Weeks    Status  Partially Met      OT  LONG TERM GOAL #4   Title  Pt will consistently perform all LE self -care home protocols ( simple self MLD, skin care, therapeutic exercise, compression therapies) daily  with max CG assistance to to achieve max edema reduction, to enanble functional improvements,  including fitting preferred street shoes and standard sized clothing, and to achieve increased AROM and reduced falls risk.    Baseline  dependent 01/04/19: Pt unable to reach feet and distal legs to perform self MLD due to AROM limited by body habitus. Pt will benefit from Flexitouch sequential pneumatic compression device ("pump") for long term LE self management.    Period  Weeks    Status  Partially Met      OT LONG TERM GOAL #5   Title  Patient will understand the importance of weight management, increased activity level, and leg elevation for optimal control of limb swelling over time.    Baseline  dependent    Time  12    Period  Weeks    Status  Achieved            Plan - 01/07/19 1212    Clinical Impression Statement  Skin hydration improved today after skin care with low ph Eucerin lotion and castor oil during MLD. Flaking skin sloughed off with dry washcloth. RLE continues to respond  very well to CDT. Limb continues to reduce and fibrosis softened today after using muff overnight under wraps. Cont as per POC. Pt not yet ready for custom garment measurments for RLE.    OT Occupational Profile and History  Comprehensive Assessment- Review of records and extensive additional review of physical, cognitive, psychosocial history related to current functional performance    Occupational performance deficits (Please refer to evaluation for details):  ADL's;IADL's;Leisure;Social Participation;Rest and Sleep;Other;Work   body image, productive activities, role performance   Body Structure / Function / Physical Skills  ADL;Obesity;Decreased knowledge of precautions;Balance;Body mechanics;Decreased knowledge of use of  DME;Flexibility;IADL;Pain;Skin integrity;Strength;Edema;Gait;Mobility;ROM    Rehab Potential  Good    Clinical Decision Making  Multiple treatment options, significant modification of task necessary    Comorbidities Affecting Occupational Performance:  Presence of comorbidities impacting occupational performance    Comorbidities impacting occupational performance description:  see SUBJECTIVE    Modification or Assistance to Complete Evaluation   Max significant modification of tasks or assist is necessary to complete    OT Frequency  3x / week    OT Duration  12 weeks   consider   reducing to 2 x weekly depending on level of and cinsistency of CG support   OT Treatment/Interventions  Self-care/ADL training;Therapeutic exercise;DME and/or AE instruction;Therapist, nutritional;Compression bandaging;Other (comment);Manual Therapy;Patient/family education;Therapeutic activities;Manual lymph drainage;Energy conservation    Plan  Intensive Phase CDT= Manual lymphatic drainage, (MLD) skin care to improve tissue health, therapeutic exercise (lymphatic pumping ther ex) and gradient compression wrapping one leg at a time with short stretch compression bandages (knee length in Ms. Grave's case) . Pt edu for LE self care  is ongoing and intensive . Management Pgase CDT is on follow-up PRN basis to support management over time, replacing compression garments, etc.    Recommended Other Services  fit with BLE custom knee high compression stockings, toe caps and convoluted foam HOS devices to limit fibrosis formation ( progression) during HOS    Consulted and Agree with Plan of Care  Patient       Patient will benefit from skilled therapeutic intervention in order to improve the following deficits and impairments:   Body Structure / Function / Physical Skills: ADL, Obesity, Decreased knowledge of precautions, Balance, Body mechanics, Decreased knowledge of use of DME, Flexibility, IADL, Pain, Skin integrity,  Strength, Edema, Gait, Mobility, ROM       Visit Diagnosis: 1. Lymphedema, not elsewhere classified       Problem List There are no active problems to display for this patient.   Andrey Spearman, MS, OTR/L, [facility] 01/07/19 12:15 PM  [place] MAIN [facility] SERVICES [address] [place], [place], [address] Phone: [phone]   Fax:  [phone]  Name: CORDA SHUTT MRN: [id number] Date of Birth: 22-Sep-1955

## 2019-01-11 ENCOUNTER — Ambulatory Visit: Payer: Medicare Other | Admitting: Occupational Therapy

## 2019-01-11 ENCOUNTER — Other Ambulatory Visit: Payer: Self-pay

## 2019-01-11 DIAGNOSIS — I890 Lymphedema, not elsewhere classified: Secondary | ICD-10-CM

## 2019-01-11 NOTE — Therapy (Signed)
[place] MAIN [facility] SERVICES [address] [place], [place], [address] Phone: [phone]   Fax:  [phone]  Occupational Therapy Treatment  Patient Details  Name: Nancy Riley MRN: [id number] Date of Birth: 1955-11-01 Referring Provider (OT): Delight Stare, MD   Encounter Date: 01/11/2019  OT End of Session - 01/11/19 1307    Visit Number  13    Number of Visits  36    Date for OT Re-Evaluation  03/02/19    OT Start Time  1006    OT Stop Time  1105    OT Time Calculation (min)  59 min    Activity Tolerance  Patient tolerated treatment well;No increased pain   limited by body habitus. Unable to reach feet and lower legs 2/2 girth   Behavior During Therapy  [facility] for tasks assessed/performed       Past Medical History:  Diagnosis Date  . Diabetes mellitus without complication ([place])   . Hypercholesteremia   . Hypertension     Past Surgical History:  Procedure Laterality Date  . BREAST EXCISIONAL BIOPSY Left 1970   negative  . COLONOSCOPY      There were no vitals filed for this visit.  Subjective Assessment - 01/11/19 1011    Subjective   [name] presents in transport wc for OT Rx day 13/ 36 to address BLE lymphedema . Pt presents with multilayer compression wrap in place below the knee. "My back is killing me today. It's been in the 10/10 zone all weekend." Pt reports she continues to sleep in her recliner.    Pertinent History  Dx relevant to lymphedema (LE): HTN, DM, neuropathy, DJD, arthritis, sciatica,  chronic back pain, morbid obesity, (BMI 50-59.9)    Repetition  Increases Symptoms    Special Tests  strong + Stemmer sign at base of toes bilaterally    Pain Onset  Other (comment)   onset "years ago"                  OT Treatments/Exercises (OP) - 01/11/19 0001      ADLs   ADL Education Given  Yes      Manual Therapy   Manual Therapy  Edema management;Manual Lymphatic Drainage (MLD);Compression Bandaging     Edema Management  skin care as establlished    Manual Lymphatic Drainage (MLD)  MLD to RLE using short neck sequence , deep diaphragmatic breathing and functional inguinal pathways.    Compression Bandaging   Custom chipped foam mudff to distal leg and ankle RLE in effort to reduc   fibrosis and improve lymphatic function using areas of high and low pressure. Applied remaining short stretch wraps as established.             OT Education - 01/11/19 1014    Education Details  Continued skilled Pt/caregiver education  And LE ADL training throughout visit for lymphedema self care/ home program, including compression wrapping, compression garment and device wear/care, lymphatic pumping ther ex, simple self-MLD, and skin care. Discussed progress towards goals.    Person(s) Educated  Patient    Methods  Explanation;Demonstration;Tactile cues;Verbal cues;Handout    Comprehension  Verbalized understanding;Returned demonstration;Need further instruction          OT Long Term Goals - 01/06/19 1019      OT LONG TERM GOAL #1   Title  Pt will demonstrate understanding of lymphedema precautions and signs/ symptoms of cellulitisby identifying 6 items for each using printed Lymphedema Workbook  for reference PRN (modified independence) to limit LE progression and infection risk.    Baseline  Max A    Time  4    Period  Days    Status  Achieved      OT LONG TERM GOAL #2   Title  Pt will require Max assist from caregiver who will be able to apply multilayer, gradient compression wraps using correct techniques after skilled training to reduce limb volume and limity LE progression.    Baseline  dependent    Time  4    Period  Days    Status  Achieved      OT LONG TERM GOAL #3   Title  Pt will achieve 10% limb volume reductions  bilaterally from ankle to tibial tuberosity during Intensive Phase CDT to return limbs to typical size and shape ,. reduce infection risk and improve basic andf  instrumental ALDs performance.    Baseline  dependent  01/04/19: achieved 12/31/18 for RLE with 26.73% limb volume reduction below the knee.    Time  12    Period  Weeks    Status  Partially Met      OT LONG TERM GOAL #4   Title  Pt will consistently perform all LE self -care home protocols ( simple self MLD, skin care, therapeutic exercise, compression therapies) daily  with max CG assistance to to achieve max edema reduction, to enanble functional improvements,  including fitting preferred street shoes and standard sized clothing, and to achieve increased AROM and reduced falls risk.    Baseline  dependent 01/04/19: Pt unable to reach feet and distal legs to perform self MLD due to AROM limited by body habitus. Pt will benefit from Flexitouch sequential pneumatic compression device ("pump") for long term LE self management.    Period  Weeks    Status  Partially Met      OT LONG TERM GOAL #5   Title  Patient will understand the importance of weight management, increased activity level, and leg elevation for optimal control of limb swelling over time.    Baseline  dependent    Time  12    Period  Weeks    Status  Achieved            Plan - 01/11/19 1308    Clinical Impression Statement  Compression wrap remained in place since last seen. Bandages and foam had slid and bunched up distally, resulting in increased ankle and distal leg swelling and thickened fibrosi in tissue. Pt tolerated MLD as established and skin care to RLE without difficulty. Reapplied chipped foam muff to distal leg and ankle in effort to decreased fibrosis. Cont as per POC. Encourage to remove wraps each day to bath , inspect and ply lotion to feet and legs daily.    OT Occupational Profile and History  Comprehensive Assessment- Review of records and extensive additional review of physical, cognitive, psychosocial history related to current functional performance    Occupational performance deficits (Please refer to  evaluation for details):  ADL's;IADL's;Leisure;Social Participation;Rest and Sleep;Other;Work   Engineer, building services, productive activities, role performance   Body Structure / Function / Physical Skills  ADL;Obesity;Decreased knowledge of precautions;Balance;Body mechanics;Decreased knowledge of use of DME;Flexibility;IADL;Pain;Skin integrity;Strength;Edema;Gait;Mobility;ROM    Rehab Potential  Good    Clinical Decision Making  Multiple treatment options, significant modification of task necessary    Comorbidities Affecting Occupational Performance:  Presence of comorbidities impacting occupational performance    Comorbidities impacting occupational performance description:  see SUBJECTIVE    Modification or Assistance to Complete Evaluation   Max significant modification of tasks or assist is necessary to complete    OT Frequency  3x / week    OT Duration  12 weeks   consider reducing to 2 x weekly depending on level of and cinsistency of CG support   OT Treatment/Interventions  Self-care/ADL training;Therapeutic exercise;DME and/or AE instruction;Therapist, nutritional;Compression bandaging;Other (comment);Manual Therapy;Patient/family education;Therapeutic activities;Manual lymph drainage;Energy conservation    Plan  Intensive Phase CDT= Manual lymphatic drainage, (MLD) skin care to improve tissue health, therapeutic exercise (lymphatic pumping ther ex) and gradient compression wrapping one leg at a time with short stretch compression bandages (knee length in Ms. Grave's case) . Pt edu for LE self care  is ongoing and intensive . Management Pgase CDT is on follow-up PRN basis to support management over time, replacing compression garments, etc.    Recommended Other Services  fit with BLE custom knee high compression stockings, toe caps and convoluted foam HOS devices to limit fibrosis formation ( progression) during HOS    Consulted and Agree with Plan of Care  Patient       Patient will benefit  from skilled therapeutic intervention in order to improve the following deficits and impairments:   Body Structure / Function / Physical Skills: ADL, Obesity, Decreased knowledge of precautions, Balance, Body mechanics, Decreased knowledge of use of DME, Flexibility, IADL, Pain, Skin integrity, Strength, Edema, Gait, Mobility, ROM       Visit Diagnosis: 1. Lymphedema, not elsewhere classified       Problem List There are no active problems to display for this patient.   Andrey Spearman, MS, OTR/L, CLT-LANA 01/11/19 1:12 PM  [place] MAIN [facility] SERVICES [address] [place], [place], [address] Phone: [phone]   Fax:  [phone]  Name: MCKENNAH KRETCHMER MRN: [id number] Date of Birth: 21-Jun-1955

## 2019-01-13 ENCOUNTER — Ambulatory Visit: Payer: Medicare Other | Admitting: Occupational Therapy

## 2019-01-13 ENCOUNTER — Other Ambulatory Visit: Payer: Self-pay

## 2019-01-13 DIAGNOSIS — I890 Lymphedema, not elsewhere classified: Secondary | ICD-10-CM | POA: Diagnosis not present

## 2019-01-13 NOTE — Therapy (Signed)
[place] MAIN [facility] SERVICES [address] [place], [place], [address] Phone: [phone]   Fax:  [phone]  Occupational Therapy Treatment  Patient Details  Name: Nancy Riley MRN: [id number] Date of Birth: 1955-06-23 Referring Provider (OT): Delight Stare, MD   Encounter Date: 01/13/2019  OT End of Session - 01/13/19 1428    Visit Number  14    Number of Visits  36    Date for OT Re-Evaluation  03/02/19    OT Start Time  1000    OT Stop Time  1100    OT Time Calculation (min)  60 min    Activity Tolerance  Patient tolerated treatment well;No increased pain   limited by body habitus. Unable to reach feet and lower legs 2/2 girth   Behavior During Therapy  [facility] for tasks assessed/performed       Past Medical History:  Diagnosis Date  . Diabetes mellitus without complication ([place])   . Hypercholesteremia   . Hypertension     Past Surgical History:  Procedure Laterality Date  . BREAST EXCISIONAL BIOPSY Left 1970   negative  . COLONOSCOPY      There were no vitals filed for this visit.  Subjective Assessment - 01/13/19 1008    Subjective   Nancy Riley presents in transport wc for OT Rx day 14/ 36 to address BLE lymphedema . Pt presents with multilayer compression wrap in place below the knee." Pt reports she continues to sleep in her recliner with LLE hanging off and down. Pt reports pain in LLE kept her awake most of the night last night.    Pertinent History  Dx relevant to lymphedema (LE): HTN, DM, neuropathy, DJD, arthritis, sciatica,  chronic back pain, morbid obesity, (BMI 50-59.9)    Repetition  Increases Symptoms    Special Tests  strong + Stemmer sign at base of toes bilaterally    Pain Onset  Other (comment)   onset "years ago"                  OT Treatments/Exercises (OP) - 01/13/19 0001      ADLs   ADL Education Given  Yes      Manual Therapy   Manual Therapy  Edema management;Compression  Bandaging;Manual Lymphatic Drainage (MLD)    Edema Management  skin care as establlished    Manual Lymphatic Drainage (MLD)  MLD to RLE using short neck sequence , deep diaphragmatic breathing and functional inguinal pathways.    Compression Bandaging  omitted ankle/leg chip bag today due to discomfort in wraps between visits and redness    in this area today.             OT Education - 01/13/19 1430    Education Details  Continued skilled Pt/caregiver education  And LE ADL training throughout visit for lymphedema self care/ home program, including compression wrapping, compression garment and device wear/care, lymphatic pumping ther ex, simple self-MLD, and skin care. Discussed progress towards goals.    Person(s) Educated  Patient    Methods  Explanation;Demonstration;Tactile cues;Verbal cues;Handout    Comprehension  Verbalized understanding;Returned demonstration;Need further instruction          OT Long Term Goals - 01/06/19 1019      OT LONG TERM GOAL #1   Title  Pt will demonstrate understanding of lymphedema precautions and signs/ symptoms of cellulitisby identifying 6 items for each using printed Lymphedema Workbook for reference PRN (modified independence) to limit LE progression  and infection risk.    Baseline  Max A    Time  4    Period  Days    Status  Achieved      OT LONG TERM GOAL #2   Title  Pt will require Max assist from caregiver who will be able to apply multilayer, gradient compression wraps using correct techniques after skilled training to reduce limb volume and limity LE progression.    Baseline  dependent    Time  4    Period  Days    Status  Achieved      OT LONG TERM GOAL #3   Title  Pt will achieve 10% limb volume reductions  bilaterally from ankle to tibial tuberosity during Intensive Phase CDT to return limbs to typical size and shape ,. reduce infection risk and improve basic andf instrumental ALDs performance.    Baseline  dependent  01/04/19:  achieved 12/31/18 for RLE with 26.73% limb volume reduction below the knee.    Time  12    Period  Weeks    Status  Partially Met      OT LONG TERM GOAL #4   Title  Pt will consistently perform all LE self -care home protocols ( simple self MLD, skin care, therapeutic exercise, compression therapies) daily  with max CG assistance to to achieve max edema reduction, to enanble functional improvements,  including fitting preferred street shoes and standard sized clothing, and to achieve increased AROM and reduced falls risk.    Baseline  dependent 01/04/19: Pt unable to reach feet and distal legs to perform self MLD due to AROM limited by body habitus. Pt will benefit from Flexitouch sequential pneumatic compression device ("pump") for long term LE self management.    Period  Weeks    Status  Partially Met      OT LONG TERM GOAL #5   Title  Patient will understand the importance of weight management, increased activity level, and leg elevation for optimal control of limb swelling over time.    Baseline  dependent    Time  12    Period  Weeks    Status  Achieved            Plan - 01/13/19 1431    Clinical Impression Statement  Reviewed Pt edu re benefits of elevating legs and fluid dynamics of LE during HOS. Encouraged Pt to elevate legs whenever sitting and as much as possible during HOS. Skin at R ankle and distal leg under muff is reddened , but without other visible signs of infection. Skin temp ios WNL to palpation. RLE appears well congested below the knee, so I suspect compression became too tighter and more uncomfortable with gravity dependent positioning. Pt agrees with plan to complete compression garment measurements     for RLE next     visit. She completed charity [employer] application today to request help w/ garment costs.    OT Occupational Profile and History  Comprehensive Assessment- Review of records and extensive additional review of physical, cognitive, psychosocial history  related to current functional performance    Occupational performance deficits (Please refer to evaluation for details):  ADL's;IADL's;Leisure;Social Participation;Rest and Sleep;Other;Work   Engineer, building services, productive activities, role performance   Body Structure / Function / Physical Skills  ADL;Obesity;Decreased knowledge of precautions;Balance;Body mechanics;Decreased knowledge of use of DME;Flexibility;IADL;Pain;Skin integrity;Strength;Edema;Gait;Mobility;ROM    Rehab Potential  Good    Clinical Decision Making  Multiple treatment options, significant modification of task necessary  Comorbidities Affecting Occupational Performance:  Presence of comorbidities impacting occupational performance    Comorbidities impacting occupational performance description:  see SUBJECTIVE    Modification or Assistance to Complete Evaluation   Max significant modification of tasks or assist is necessary to complete    OT Frequency  3x / week    OT Duration  12 weeks   consider reducing to 2 x weekly depending on level of and cinsistency of CG support   OT Treatment/Interventions  Self-care/ADL training;Therapeutic exercise;DME and/or AE instruction;Therapist, nutritional;Compression bandaging;Other (comment);Manual Therapy;Patient/family education;Therapeutic activities;Manual lymph drainage;Energy conservation    Plan  Intensive Phase CDT= Manual lymphatic drainage, (MLD) skin care to improve tissue health, therapeutic exercise (lymphatic pumping ther ex) and gradient compression wrapping one leg at a time with short stretch compression bandages (knee length in Ms. Grave's case) . Pt edu for LE self care  is ongoing and intensive . Management Pgase CDT is on follow-up PRN basis to support management over time, replacing compression garments, etc.    Recommended Other Services  fit with BLE custom knee high compression stockings, toe caps and convoluted foam HOS devices to limit fibrosis formation (  progression) during HOS    Consulted and Agree with Plan of Care  Patient       Patient will benefit from skilled therapeutic intervention in order to improve the following deficits and impairments:   Body Structure / Function / Physical Skills: ADL, Obesity, Decreased knowledge of precautions, Balance, Body mechanics, Decreased knowledge of use of DME, Flexibility, IADL, Pain, Skin integrity, Strength, Edema, Gait, Mobility, ROM       Visit Diagnosis: 1. Lymphedema, not elsewhere classified       Problem List There are no active problems to display for this patient.  Andrey Spearman, MS, OTR/L, [facility] 01/13/19 2:35 PM   [place] MAIN [facility] SERVICES [address] [place], [place], [address] Phone: [phone]   Fax:  [phone]  Name: Nancy Riley MRN: [id number] Date of Birth: 02/13/1956

## 2019-01-14 ENCOUNTER — Other Ambulatory Visit: Payer: Self-pay

## 2019-01-14 ENCOUNTER — Ambulatory Visit: Payer: Medicare Other | Admitting: Occupational Therapy

## 2019-01-14 DIAGNOSIS — I890 Lymphedema, not elsewhere classified: Secondary | ICD-10-CM

## 2019-01-14 NOTE — Therapy (Signed)
[place] MAIN [facility] SERVICES [address] [place], [place], [address] Phone: [phone]   Fax:  [phone]  Occupational Therapy Treatment  Patient Details  Name: Nancy Riley MRN: [id number] Date of Birth: Oct 01, 1955 Referring Provider (OT): Delight Stare, MD   Encounter Date: 01/14/2019  OT End of Session - 01/14/19 1224    Visit Number  15    Number of Visits  36    Date for OT Re-Evaluation  03/02/19    OT Start Time  0905    OT Stop Time  1000    OT Time Calculation (min)  55 min    Activity Tolerance  Patient tolerated treatment well;No increased pain   limited by body habitus. Unable to reach feet and lower legs 2/2 girth   Behavior During Therapy  [facility] for tasks assessed/performed       Past Medical History:  Diagnosis Date  . Diabetes mellitus without complication ([place])   . Hypercholesteremia   . Hypertension     Past Surgical History:  Procedure Laterality Date  . BREAST EXCISIONAL BIOPSY Left 1970   negative  . COLONOSCOPY      There were no vitals filed for this visit.  Subjective Assessment - 01/14/19 0909    Subjective   [name] presents in transport wc for OT Rx day 15/ 36 to address BLE lymphedema . Pt presents with multilayer compression wrap in place below the knee.Pt reports leg pain    did not interrupt sleep last night. Pt in agreement with plan to complete garment measurements today.    Pertinent History  Dx relevant to lymphedema (LE): HTN, DM, neuropathy, DJD, arthritis, sciatica,  chronic back pain, morbid obesity, (BMI 50-59.9)    Repetition  Increases Symptoms    Special Tests  strong + Stemmer sign at base of toes bilaterally    Pain Onset  Other (comment)   onset "years ago"                  OT Treatments/Exercises (OP) - 01/14/19 0001      ADLs   ADL Education Given  Yes      Manual Therapy   Manual Therapy  Edema management;Compression Bandaging    Manual therapy comments   anatomicaol measurements for RLE custom compression garments    Edema Management  skin care as establlished    Compression Bandaging  omitted ankle/leg chip bag today due to discomfort in wraps between visits and redness    in this area today.             OT Education - 01/14/19 1224    Education Details  Pt edu re custom compression garment measuring and re-measuring process, assessment and fitting.    Person(s) Educated  Patient    Methods  Explanation;Demonstration;Tactile cues;Verbal cues;Handout    Comprehension  Verbalized understanding;Returned demonstration;Need further instruction          OT Long Term Goals - 01/06/19 1019      OT LONG TERM GOAL #1   Title  Pt will demonstrate understanding of lymphedema precautions and signs/ symptoms of cellulitisby identifying 6 items for each using printed Lymphedema Workbook for reference PRN (modified independence) to limit LE progression and infection risk.    Baseline  Max A    Time  4    Period  Days    Status  Achieved      OT LONG TERM GOAL #2   Title  Pt will  require Max assist from caregiver who will be able to apply multilayer, gradient compression wraps using correct techniques after skilled training to reduce limb volume and limity LE progression.    Baseline  dependent    Time  4    Period  Days    Status  Achieved      OT LONG TERM GOAL #3   Title  Pt will achieve 10% limb volume reductions  bilaterally from ankle to tibial tuberosity during Intensive Phase CDT to return limbs to typical size and shape ,. reduce infection risk and improve basic andf instrumental ALDs performance.    Baseline  dependent  01/04/19: achieved 12/31/18 for RLE with 26.73% limb volume reduction below the knee.    Time  12    Period  Weeks    Status  Partially Met      OT LONG TERM GOAL #4   Title  Pt will consistently perform all LE self -care home protocols ( simple self MLD, skin care, therapeutic exercise, compression therapies)  daily  with max CG assistance to to achieve max edema reduction, to enanble functional improvements,  including fitting preferred street shoes and standard sized clothing, and to achieve increased AROM and reduced falls risk.    Baseline  dependent 01/04/19: Pt unable to reach feet and distal legs to perform self MLD due to AROM limited by body habitus. Pt will benefit from Flexitouch sequential pneumatic compression device ("pump") for long term LE self management.    Period  Weeks    Status  Partially Met      OT LONG TERM GOAL #5   Title  Patient will understand the importance of weight management, increased activity level, and leg elevation for optimal control of limb swelling over time.    Baseline  dependent    Time  12    Period  Weeks    Status  Achieved            Plan - 01/14/19 1225    Clinical Impression Statement  Completed RLE anatomical measurements for custom, knee length, flat knit , ccl 3 cmopression stockings for LLE today. Pt tolerated measuring process witthout difficulty. Remainder of visit spent on skin care w/ low ph lotion to improve skin condition and on compression wrapping as established. Cont as per POC.    OT Occupational Profile and History  Comprehensive Assessment- Review of records and extensive additional review of physical, cognitive, psychosocial history related to current functional performance    Occupational performance deficits (Please refer to evaluation for details):  ADL's;IADL's;Leisure;Social Participation;Rest and Sleep;Other;Work   Engineer, building services, productive activities, role performance   Body Structure / Function / Physical Skills  ADL;Obesity;Decreased knowledge of precautions;Balance;Body mechanics;Decreased knowledge of use of DME;Flexibility;IADL;Pain;Skin integrity;Strength;Edema;Gait;Mobility;ROM    Rehab Potential  Good    Clinical Decision Making  Multiple treatment options, significant modification of task necessary    Comorbidities  Affecting Occupational Performance:  Presence of comorbidities impacting occupational performance    Comorbidities impacting occupational performance description:  see SUBJECTIVE    Modification or Assistance to Complete Evaluation   Max significant modification of tasks or assist is necessary to complete    OT Frequency  3x / week    OT Duration  12 weeks   consider reducing to 2 x weekly depending on level of and cinsistency of CG support   OT Treatment/Interventions  Self-care/ADL training;Therapeutic exercise;DME and/or AE instruction;Therapist, nutritional;Compression bandaging;Other (comment);Manual Therapy;Patient/family education;Therapeutic activities;Manual lymph drainage;Energy conservation  Plan  Intensive Phase CDT= Manual lymphatic drainage, (MLD) skin care to improve tissue health, therapeutic exercise (lymphatic pumping ther ex) and gradient compression wrapping one leg at a time with short stretch compression bandages (knee length in Ms. Grave's case) . Pt edu for LE self care  is ongoing and intensive . Management Pgase CDT is on follow-up PRN basis to support management over time, replacing compression garments, etc.    Recommended Other Services  fit with BLE custom knee high compression stockings, toe caps and convoluted foam HOS devices to limit fibrosis formation ( progression) during HOS    Consulted and Agree with Plan of Care  Patient       Patient will benefit from skilled therapeutic intervention in order to improve the following deficits and impairments:   Body Structure / Function / Physical Skills: ADL, Obesity, Decreased knowledge of precautions, Balance, Body mechanics, Decreased knowledge of use of DME, Flexibility, IADL, Pain, Skin integrity, Strength, Edema, Gait, Mobility, ROM       Visit Diagnosis: 1. Lymphedema, not elsewhere classified       Problem List There are no active problems to display for this patient.   Andrey Spearman, MS, OTR/L,  [facility] 01/14/19 12:29 PM   [place] MAIN [facility] SERVICES [address] [place], [place], [address] Phone: [phone]   Fax:  [phone]  Name: RAYLYN CARTON MRN: [id number] Date of Birth: Jan 24, 1956

## 2019-01-19 ENCOUNTER — Ambulatory Visit: Payer: Medicare Other | Admitting: Occupational Therapy

## 2019-01-19 ENCOUNTER — Other Ambulatory Visit: Payer: Self-pay

## 2019-01-19 DIAGNOSIS — I890 Lymphedema, not elsewhere classified: Secondary | ICD-10-CM | POA: Diagnosis not present

## 2019-01-19 NOTE — Therapy (Signed)
[place] MAIN [facility] SERVICES [address] [place], [place], [address] Phone: [phone]   Fax:  [phone]  Occupational Therapy Treatment  Patient Details  Name: Nancy Riley MRN: [id number] Date of Birth: Mar 04, 1956 Referring Provider (OT): Delight Stare, MD   Encounter Date: 01/19/2019  OT End of Session - 01/19/19 0908    Visit Number  16    Number of Visits  36    Date for OT Re-Evaluation  03/02/19    OT Stop Time  0901    Activity Tolerance  Patient tolerated treatment well;No increased pain   limited by body habitus. Unable to reach feet and lower legs 2/2 girth   Behavior During Therapy  [facility] for tasks assessed/performed       Past Medical History:  Diagnosis Date  . Diabetes mellitus without complication ([place])   . Hypercholesteremia   . Hypertension     Past Surgical History:  Procedure Laterality Date  . BREAST EXCISIONAL BIOPSY Left 1970   negative  . COLONOSCOPY      There were no vitals filed for this visit.  Subjective Assessment - 01/19/19 0907    Subjective   [name] presents in transport wc for OT Rx day 16/ 36 to address BLE lymphedema . Pt presents with multilayer compression wrap in place below the knee.Pt reports ongoing knee and back pain. She has no other complaints.    Pertinent History  Dx relevant to lymphedema (LE): HTN, DM, neuropathy, DJD, arthritis, sciatica,  chronic back pain, morbid obesity, (BMI 50-59.9)    Repetition  Increases Symptoms    Special Tests  strong + Stemmer sign at base of toes bilaterally    Pain Onset  Other (comment)   onset "years ago"                  OT Treatments/Exercises (OP) - 01/19/19 0001      Manual Therapy   Manual Therapy  Edema management    Edema Management  skin care as establlished    Manual Lymphatic Drainage (MLD)  MLD to RLE using short neck sequence , deep diaphragmatic breathing and functional inguinal pathways.    Compression  Bandaging  omitted ankle/leg chip bag today due to discomfort in wraps between visits and redness    in this area today.             OT Education - 01/19/19 1521    Education Details  Continued skilled Pt/caregiver education  And LE ADL training throughout visit for lymphedema self care/ home program, including compression wrapping, compression garment and device wear/care, lymphatic pumping ther ex, simple self-MLD, and skin care. Discussed progress towards goals.    Person(s) Educated  Patient    Methods  Explanation;Demonstration;Tactile cues;Verbal cues;Handout    Comprehension  Verbalized understanding;Returned demonstration;Need further instruction          OT Long Term Goals - 01/06/19 1019      OT LONG TERM GOAL #1   Title  Pt will demonstrate understanding of lymphedema precautions and signs/ symptoms of cellulitisby identifying 6 items for each using printed Lymphedema Workbook for reference PRN (modified independence) to limit LE progression and infection risk.    Baseline  Max A    Time  4    Period  Days    Status  Achieved      OT LONG TERM GOAL #2   Title  Pt will require Max assist from caregiver who will be able  to apply multilayer, gradient compression wraps using correct techniques after skilled training to reduce limb volume and limity LE progression.    Baseline  dependent    Time  4    Period  Days    Status  Achieved      OT LONG TERM GOAL #3   Title  Pt will achieve 10% limb volume reductions  bilaterally from ankle to tibial tuberosity during Intensive Phase CDT to return limbs to typical size and shape ,. reduce infection risk and improve basic andf instrumental ALDs performance.    Baseline  dependent  01/04/19: achieved 12/31/18 for RLE with 26.73% limb volume reduction below the knee.    Time  12    Period  Weeks    Status  Partially Met      OT LONG TERM GOAL #4   Title  Pt will consistently perform all LE self -care home protocols ( simple self  MLD, skin care, therapeutic exercise, compression therapies) daily  with max CG assistance to to achieve max edema reduction, to enanble functional improvements,  including fitting preferred street shoes and standard sized clothing, and to achieve increased AROM and reduced falls risk.    Baseline  dependent 01/04/19: Pt unable to reach feet and distal legs to perform self MLD due to AROM limited by body habitus. Pt will benefit from Flexitouch sequential pneumatic compression device ("pump") for long term LE self management.    Period  Weeks    Status  Partially Met      OT LONG TERM GOAL #5   Title  Patient will understand the importance of weight management, increased activity level, and leg elevation for optimal control of limb swelling over time.    Baseline  dependent    Time  12    Period  Weeks    Status  Achieved            Plan - 01/19/19 1524    Clinical Impression Statement  Good tolerance today for CDT- MLD, skin care and compression wraps as westablished to RLE. Cont as per POC.    OT Occupational Profile and History  Comprehensive Assessment- Review of records and extensive additional review of physical, cognitive, psychosocial history related to current functional performance    Occupational performance deficits (Please refer to evaluation for details):  ADL's;IADL's;Leisure;Social Participation;Rest and Sleep;Other;Work   Engineer, building services, productive activities, role performance   Body Structure / Function / Physical Skills  ADL;Obesity;Decreased knowledge of precautions;Balance;Body mechanics;Decreased knowledge of use of DME;Flexibility;IADL;Pain;Skin integrity;Strength;Edema;Gait;Mobility;ROM    Rehab Potential  Good    Clinical Decision Making  Multiple treatment options, significant modification of task necessary    Comorbidities Affecting Occupational Performance:  Presence of comorbidities impacting occupational performance    Comorbidities impacting occupational  performance description:  see SUBJECTIVE    Modification or Assistance to Complete Evaluation   Max significant modification of tasks or assist is necessary to complete    OT Frequency  3x / week    OT Duration  12 weeks   consider reducing to 2 x weekly depending on level of and cinsistency of CG support   OT Treatment/Interventions  Self-care/ADL training;Therapeutic exercise;DME and/or AE instruction;Therapist, nutritional;Compression bandaging;Other (comment);Manual Therapy;Patient/family education;Therapeutic activities;Manual lymph drainage;Energy conservation    Plan  Intensive Phase CDT= Manual lymphatic drainage, (MLD) skin care to improve tissue health, therapeutic exercise (lymphatic pumping ther ex) and gradient compression wrapping one leg at a time with short stretch compression bandages (knee length in  Ms. Jackalyn Lombard case) . Pt edu for LE self care  is ongoing and intensive . Management Pgase CDT is on follow-up PRN basis to support management over time, replacing compression garments, etc.    Recommended Other Services  fit with BLE custom knee high compression stockings, toe caps and convoluted foam HOS devices to limit fibrosis formation ( progression) during HOS    Consulted and Agree with Plan of Care  Patient       Patient will benefit from skilled therapeutic intervention in order to improve the following deficits and impairments:   Body Structure / Function / Physical Skills: ADL, Obesity, Decreased knowledge of precautions, Balance, Body mechanics, Decreased knowledge of use of DME, Flexibility, IADL, Pain, Skin integrity, Strength, Edema, Gait, Mobility, ROM       Visit Diagnosis: 1. Lymphedema, not elsewhere classified       Problem List There are no active problems to display for this patient.   Andrey Spearman, MS, OTR/L, [facility] 01/19/19 4:53 PM  [place] MAIN [facility] SERVICES [address] [place], [place],  [address] Phone: [phone]   Fax:  [phone]  Name: ARYONNA GUNNERSON MRN: [id number] Date of Birth: [birth year]

## 2019-01-21 ENCOUNTER — Other Ambulatory Visit: Payer: Self-pay

## 2019-01-21 ENCOUNTER — Ambulatory Visit: Payer: Medicare Other | Admitting: Occupational Therapy

## 2019-01-21 DIAGNOSIS — I890 Lymphedema, not elsewhere classified: Secondary | ICD-10-CM | POA: Diagnosis not present

## 2019-01-21 NOTE — Therapy (Signed)
[place] MAIN [facility] SERVICES [address] [place], [place], [address] Phone: [phone]   Fax:  [phone]  Occupational Therapy Treatment  Patient Details  Name: Nancy Riley MRN: [id number] Date of Birth: 1956-05-22 Referring Provider (OT): Delight Stare, MD   Encounter Date: 01/21/2019  OT End of Session - 01/21/19 1040    Visit Number  17    Number of Visits  36    Date for OT Re-Evaluation  03/02/19    OT Start Time  0905    OT Stop Time  1000    OT Time Calculation (min)  55 min    Activity Tolerance  Patient tolerated treatment well;No increased pain   limited by body habitus. Unable to reach feet and lower legs 2/2 girth   Behavior During Therapy  [facility] for tasks assessed/performed       Past Medical History:  Diagnosis Date  . Diabetes mellitus without complication ([place])   . Hypercholesteremia   . Hypertension     Past Surgical History:  Procedure Laterality Date  . BREAST EXCISIONAL BIOPSY Left 1970   negative  . COLONOSCOPY      There were no vitals filed for this visit.  Subjective Assessment - 01/21/19 0909    Subjective   [name] presents in transport wc for OT Rx day 17/ 36 to address BLE lymphedema . Pt presents with multilayer compression wrap in place below the knee.Pt reports ongoing knee and back pain. She has no other concerns today.Pt informed that charitable foundation application for assistance with garment funding was approved.    Pertinent History  Dx relevant to lymphedema (LE): HTN, DM, neuropathy, DJD, arthritis, sciatica,  chronic back pain, morbid obesity, (BMI 50-59.9)    Repetition  Increases Symptoms    Special Tests  strong + Stemmer sign at base of toes bilaterally    Pain Onset  Other (comment)   onset "years ago"                  OT Treatments/Exercises (OP) - 01/21/19 0001      Manual Therapy   Manual Therapy  Edema management;Manual Lymphatic Drainage (MLD);Compression  Bandaging    Edema Management  skin care as establlished    Manual Lymphatic Drainage (MLD)  MLD to RLE using short neck sequence , deep diaphragmatic breathing and functional inguinal pathways.    Compression Bandaging  RLE cmpression wraps as established             OT Education - 01/21/19 0911    Education Details  Reviewed POC and Progress towards OT goals. Continued skilled Pt/caregiver education  And LE ADL training throughout visit for lymphedema self care/ home program, including compression wrapping, compression garment and device wear/care, lymphatic pumping ther ex, simple self-MLD, and skin care. Discussed progress towards goals.    Person(s) Educated  Patient    Methods  Explanation;Demonstration;Tactile cues;Verbal cues;Handout    Comprehension  Verbalized understanding;Returned demonstration;Need further instruction          OT Long Term Goals - 01/21/19 0911      OT LONG TERM GOAL #1   Title  Pt will demonstrate understanding of lymphedema precautions and signs/ symptoms of cellulitisby identifying 6 items for each using printed Lymphedema Workbook for reference PRN (modified independence) to limit LE progression and infection risk.    Baseline  Max A    Time  4    Period  Days    Status  Achieved      OT LONG TERM GOAL #2   Title  Pt will require Max assist from caregiver who will be able to apply multilayer, gradient compression wraps using correct techniques after skilled training to reduce limb volume and limity LE progression.    Baseline  dependent    Time  4    Period  Days    Status  Achieved      OT LONG TERM GOAL #3   Title  Pt will achieve 10% limb volume reductions  bilaterally from ankle to tibial tuberosity during Intensive Phase CDT to return limbs to typical size and shape ,. reduce infection risk and improve basic andf instrumental ALDs performance.    Baseline  dependent  01/04/19: achieved 12/31/18 for RLE with 26.73% limb volume reduction  below the knee.    Time  12    Period  Weeks    Status  Partially Met      OT LONG TERM GOAL #4   Title  Pt will consistently perform all LE self -care home protocols ( simple self MLD, skin care, therapeutic exercise, compression therapies) daily  with max CG assistance to to achieve max edema reduction, to enanble functional improvements,  including fitting preferred street shoes and standard sized clothing, and to achieve increased AROM and reduced falls risk.    Baseline  dependent 01/04/19: Pt unable to reach feet and distal legs to perform self MLD due to AROM limited by body habitus. Pt will benefit from Flexitouch sequential 01/11/19 : Pt reports ~85% compliance. pneumatic compression device ("pump") for long term LE self management.    Period  Weeks    Status  Partially Met      OT LONG TERM GOAL #5   Title  Patient will understand the importance of weight management, increased activity level, and leg elevation for optimal control of limb swelling over time.    Baseline  dependent    Time  12    Period  Weeks    Status  Achieved      OT LONG TERM GOAL #6   Title  During self-management phase of CDT Pt will retain volumetric reduction bilaterally over 6 month visit interval with no greater than 3% upward fluctuation to limit progression of chronic leg swelling and related skin changes, and to reduce infection risk.    Baseline  Max A    Time  6    Period  Months    Status  New    Target Date  07/20/19            Plan - 01/21/19 1042    Clinical Impression Statement  LLE swelling reduction below the knee continues to decrease , but has slowed. Commenced LLE MLD today in keeping with established patterns on R. We will continue to wrap RLE until custom     compression garments are fitted and we can commence wrapping LLE. Pt reports functional gain today in that she's now able to don cast shoe with modified independence (extra time and assistive device)  when seated and stable a  low toiler with foot propped on walker. Cont as per POC. Lifting the R leg for transfers and walking requires significantly less physical effort, and AROM at the R knee, altho pain ful 2/2 OA, is increased with decreased girth at leg and distal thigh. Pt continues to make excellent progress clinically and functionally..    OT Occupational Profile and History  Comprehensive Assessment- Review of  records and extensive additional review of physical, cognitive, psychosocial history related to current functional performance    Occupational performance deficits (Please refer to evaluation for details):  ADL's;IADL's;Leisure;Social Participation;Rest and Sleep;Other;Work   Engineer, building services, productive activities, role performance   Body Structure / Function / Physical Skills  ADL;Obesity;Decreased knowledge of precautions;Balance;Body mechanics;Decreased knowledge of use of DME;Flexibility;IADL;Pain;Skin integrity;Strength;Edema;Gait;Mobility;ROM    Rehab Potential  Good    Clinical Decision Making  Multiple treatment options, significant modification of task necessary    Comorbidities Affecting Occupational Performance:  Presence of comorbidities impacting occupational performance    Comorbidities impacting occupational performance description:  see SUBJECTIVE    Modification or Assistance to Complete Evaluation   Max significant modification of tasks or assist is necessary to complete    OT Frequency  3x / week    OT Duration  12 weeks   consider reducing to 2 x weekly depending on level of and cinsistency of CG support   OT Treatment/Interventions  Self-care/ADL training;Therapeutic exercise;DME and/or AE instruction;Therapist, nutritional;Compression bandaging;Other (comment);Manual Therapy;Patient/family education;Therapeutic activities;Manual lymph drainage;Energy conservation    Plan  Intensive Phase CDT= Manual lymphatic drainage, (MLD) skin care to improve tissue health, therapeutic exercise  (lymphatic pumping ther ex) and gradient compression wrapping one leg at a time with short stretch compression bandages (knee length in Ms. Grave's case) . Pt edu for LE self care  is ongoing and intensive . Management Pgase CDT is on follow-up PRN basis to support management over time, replacing compression garments, etc.    Recommended Other Services  fit with BLE custom knee high compression stockings, toe caps and convoluted foam HOS devices to limit fibrosis formation ( progression) during HOS    Consulted and Agree with Plan of Care  Patient       Patient will benefit from skilled therapeutic intervention in order to improve the following deficits and impairments:   Body Structure / Function / Physical Skills: ADL, Obesity, Decreased knowledge of precautions, Balance, Body mechanics, Decreased knowledge of use of DME, Flexibility, IADL, Pain, Skin integrity, Strength, Edema, Gait, Mobility, ROM       Visit Diagnosis: Lymphedema, not elsewhere classified    Problem List There are no active problems to display for this patient.   Andrey Spearman, MS, OTR/L, [facility] 01/21/19 10:49 AM  [place] MAIN [facility] SERVICES [address] [place], [place], [address] Phone: [phone]   Fax:  [phone]  Name: AZRA ABRELL MRN: [id number] Date of Birth: 24-May-1956

## 2019-01-22 ENCOUNTER — Ambulatory Visit: Payer: Medicare Other | Admitting: Occupational Therapy

## 2019-01-22 ENCOUNTER — Other Ambulatory Visit: Payer: Self-pay

## 2019-01-22 DIAGNOSIS — I890 Lymphedema, not elsewhere classified: Secondary | ICD-10-CM | POA: Diagnosis not present

## 2019-01-22 NOTE — Therapy (Signed)
[place] MAIN [facility] SERVICES [address] [place], [place], [address] Phone: [phone]   Fax:  [phone]  Occupational Therapy Treatment  Patient Details  Name: Nancy Riley MRN: [id number] Date of Birth: [birth year] Referring Provider (OT): Delight Stare, MD   Encounter Date: 01/22/2019  OT End of Session - 01/22/19 1119    Visit Number  18    Number of Visits  36    Date for OT Re-Evaluation  03/02/19    OT Start Time  0955    OT Stop Time  1100    OT Time Calculation (min)  65 min    Activity Tolerance  Patient tolerated treatment well;No increased pain   limited by body habitus. Unable to reach feet and lower legs 2/2 girth   Behavior During Therapy  [facility] for tasks assessed/performed       Past Medical History:  Diagnosis Date  . Diabetes mellitus without complication ([place])   . Hypercholesteremia   . Hypertension     Past Surgical History:  Procedure Laterality Date  . BREAST EXCISIONAL BIOPSY Left 1970   negative  . COLONOSCOPY      There were no vitals filed for this visit.  Subjective Assessment - 01/22/19 0959    Subjective   Nancy Riley presents in transport wc for OT Rx day 18/ 36 to address BLE lymphedema . Pt presents with multilayer compression wrap in place below the knee.Pt has no new complaints this morning. Pt informed that custom garments have been paid for and ordered.    Pertinent History  Dx relevant to lymphedema (LE): HTN, DM, neuropathy, DJD, arthritis, sciatica,  chronic back pain, morbid obesity, (BMI 50-59.9)    Repetition  Increases Symptoms    Special Tests  strong + Stemmer sign at base of toes bilaterally    Pain Onset  Other (comment)   onset "years ago"                  OT Treatments/Exercises (OP) - 01/22/19 0001      ADLs   ADL Education Given  Yes      Manual Therapy   Manual Therapy  Edema management    Edema Management  skin care as establlished    Manual Lymphatic  Drainage (MLD)  MLD to LLE using short neck sequence , deep diaphragmatic breathing and functional inguinal pathways.    Compression Bandaging  RLE cmpression wraps as established             OT Education - 01/22/19 1119    Education Details  Continued skilled Pt/caregiver education  And LE ADL training throughout visit for lymphedema self care/ home program, including compression wrapping, compression garment and device wear/care, lymphatic pumping ther ex, simple self-MLD, and skin care. Discussed progress towards goals.    Person(s) Educated  Patient    Methods  Explanation;Demonstration;Tactile cues;Verbal cues;Handout    Comprehension  Verbalized understanding;Returned demonstration;Need further instruction          OT Long Term Goals - 01/21/19 0911      OT LONG TERM GOAL #1   Title  Pt will demonstrate understanding of lymphedema precautions and signs/ symptoms of cellulitisby identifying 6 items for each using printed Lymphedema Workbook for reference PRN (modified independence) to limit LE progression and infection risk.    Baseline  Max A    Time  4    Period  Days    Status  Achieved  OT LONG TERM GOAL #2   Title  Pt will require Max assist from caregiver who will be able to apply multilayer, gradient compression wraps using correct techniques after skilled training to reduce limb volume and limity LE progression.    Baseline  dependent    Time  4    Period  Days    Status  Achieved      OT LONG TERM GOAL #3   Title  Pt will achieve 10% limb volume reductions  bilaterally from ankle to tibial tuberosity during Intensive Phase CDT to return limbs to typical size and shape ,. reduce infection risk and improve basic andf instrumental ALDs performance.    Baseline  dependent  01/04/19: achieved 12/31/18 for RLE with 26.73% limb volume reduction below the knee.    Time  12    Period  Weeks    Status  Partially Met      OT LONG TERM GOAL #4   Title  Pt will  consistently perform all LE self -care home protocols ( simple self MLD, skin care, therapeutic exercise, compression therapies) daily  with max CG assistance to to achieve max edema reduction, to enanble functional improvements,  including fitting preferred street shoes and standard sized clothing, and to achieve increased AROM and reduced falls risk.    Baseline  dependent 01/04/19: Pt unable to reach feet and distal legs to perform self MLD due to AROM limited by body habitus. Pt will benefit from Flexitouch sequential 01/11/19 : Pt reports ~85% compliance. pneumatic compression device ("pump") for long term LE self management.    Period  Weeks    Status  Partially Met      OT LONG TERM GOAL #5   Title  Patient will understand the importance of weight management, increased activity level, and leg elevation for optimal control of limb swelling over time.    Baseline  dependent    Time  12    Period  Weeks    Status  Achieved      OT LONG TERM GOAL #6   Title  During self-management phase of CDT Pt will retain volumetric reduction bilaterally over 6 month visit interval with no greater than 3% upward fluctuation to limit progression of chronic leg swelling and related skin changes, and to reduce infection risk.    Baseline  Max A    Time  6    Period  Months    Status  New    Target Date  07/20/19            Plan - 01/22/19 1120    Clinical Impression Statement  MLD to LLE again today while continuing to wrap RLE while awaiting custom compression garments. Provided  BLE skin care to improve hydration and limit infection risk with visible and palpable improvement  at end of session. Dry flaking skin on feet is slowly resolving. Added Artiflex to R ankle to reduce bunching and reduce redness . Added low profile dorsal foot pad under foam to reduce dorsal  foot swelling at base of toes. Cont as per POC. Pt comtinues to demonstrate progress towards all OT goals for LE care.    OT Occupational  Profile and History  Comprehensive Assessment- Review of records and extensive additional review of physical, cognitive, psychosocial history related to current functional performance    Occupational performance deficits (Please refer to evaluation for details):  ADL's;IADL's;Leisure;Social Participation;Rest and Sleep;Other;Work   body image, productive activities, role performance   Body  Structure / Function / Physical Skills  ADL;Obesity;Decreased knowledge of precautions;Balance;Body mechanics;Decreased knowledge of use of DME;Flexibility;IADL;Pain;Skin integrity;Strength;Edema;Gait;Mobility;ROM    Rehab Potential  Good    Clinical Decision Making  Multiple treatment options, significant modification of task necessary    Comorbidities Affecting Occupational Performance:  Presence of comorbidities impacting occupational performance    Comorbidities impacting occupational performance description:  see SUBJECTIVE    Modification or Assistance to Complete Evaluation   Max significant modification of tasks or assist is necessary to complete    OT Frequency  3x / week    OT Duration  12 weeks   consider reducing to 2 x weekly depending on level of and cinsistency of CG support   OT Treatment/Interventions  Self-care/ADL training;Therapeutic exercise;DME and/or AE instruction;Therapist, nutritional;Compression bandaging;Other (comment);Manual Therapy;Patient/family education;Therapeutic activities;Manual lymph drainage;Energy conservation    Plan  Intensive Phase CDT= Manual lymphatic drainage, (MLD) skin care to improve tissue health, therapeutic exercise (lymphatic pumping ther ex) and gradient compression wrapping one leg at a time with short stretch compression bandages (knee length in Ms. Grave's case) . Pt edu for LE self care  is ongoing and intensive . Management Pgase CDT is on follow-up PRN basis to support management over time, replacing compression garments, etc.    Recommended Other  Services  fit with BLE custom knee high compression stockings, toe caps and convoluted foam HOS devices to limit fibrosis formation ( progression) during HOS    Consulted and Agree with Plan of Care  Patient       Patient will benefit from skilled therapeutic intervention in order to improve the following deficits and impairments:   Body Structure / Function / Physical Skills: ADL, Obesity, Decreased knowledge of precautions, Balance, Body mechanics, Decreased knowledge of use of DME, Flexibility, IADL, Pain, Skin integrity, Strength, Edema, Gait, Mobility, ROM       Visit Diagnosis: Lymphedema, not elsewhere classified    Problem List There are no active problems to display for this patient.   Andrey Spearman, MS, OTR/L, [facility] 01/22/19 11:25 AM  [place] MAIN [facility] SERVICES [address] [place], [place], [address] Phone: [phone]   Fax:  [phone]  Name: Nancy Riley MRN: [id number] Date of Birth: 10-24-1955

## 2019-01-25 ENCOUNTER — Other Ambulatory Visit: Payer: Self-pay

## 2019-01-25 ENCOUNTER — Ambulatory Visit: Payer: Medicare Other | Admitting: Occupational Therapy

## 2019-01-25 DIAGNOSIS — I890 Lymphedema, not elsewhere classified: Secondary | ICD-10-CM | POA: Diagnosis not present

## 2019-01-25 NOTE — Therapy (Signed)
[place] MAIN [facility] SERVICES [address] [place], [place], [address] Phone: [phone]   Fax:  [phone]  Occupational Therapy Treatment  Patient Details  Name: BENTLEIGH WAREN MRN: [id number] Date of Birth: 1955/11/27 Referring Provider (OT): Delight Stare, MD   Encounter Date: 01/25/2019  OT End of Session - 01/25/19 1014    Visit Number  19    Number of Visits  36    Date for OT Re-Evaluation  03/02/19    OT Start Time  1000    OT Stop Time  1100    OT Time Calculation (min)  60 min    Activity Tolerance  Patient tolerated treatment well;No increased pain   limited by body habitus. Unable to reach feet and lower legs 2/2 girth   Behavior During Therapy  [facility] for tasks assessed/performed       Past Medical History:  Diagnosis Date  . Diabetes mellitus without complication ([place])   . Hypercholesteremia   . Hypertension     Past Surgical History:  Procedure Laterality Date  . BREAST EXCISIONAL BIOPSY Left 1970   negative  . COLONOSCOPY      There were no vitals filed for this visit.  Subjective Assessment - 01/25/19 1011    Subjective   [name] presents in transport wc for OT Rx day 19/ 36 to address BLE lymphedema . Pt presents with multilayer compression wrap in place below the knee.Pt has no new problems or complaints. "My back was bad yesterday."    Pertinent History  Dx relevant to lymphedema (LE): HTN, DM, neuropathy, DJD, arthritis, sciatica,  chronic back pain, morbid obesity, (BMI 50-59.9)    Repetition  Increases Symptoms    Special Tests  strong + Stemmer sign at base of toes bilaterally    Pain Onset  Other (comment)   onset "years ago"                  OT Treatments/Exercises (OP) - 01/25/19 0001      ADLs   ADL Education Given  Yes      Manual Therapy   Manual Therapy  Edema management;Manual Lymphatic Drainage (MLD);Compression Bandaging    Edema Management  skin care as establlished    Manual Lymphatic Drainage (MLD)  MLD to LLE using short neck sequence , deep diaphragmatic breathing and functional inguinal pathways.    Compression Bandaging  RLE cmpression wraps as established             OT Education - 01/25/19 1014    Education Details  Continued skilled Pt/caregiver education  And LE ADL training throughout visit for lymphedema self care/ home program, including compression wrapping, compression garment and device wear/care, lymphatic pumping ther ex, simple self-MLD, and skin care. Discussed progress towards goals.    Person(s) Educated  Patient    Methods  Explanation;Demonstration;Tactile cues;Verbal cues;Handout    Comprehension  Verbalized understanding;Returned demonstration;Need further instruction          OT Long Term Goals - 01/21/19 0911      OT LONG TERM GOAL #1   Title  Pt will demonstrate understanding of lymphedema precautions and signs/ symptoms of cellulitisby identifying 6 items for each using printed Lymphedema Workbook for reference PRN (modified independence) to limit LE progression and infection risk.    Baseline  Max A    Time  4    Period  Days    Status  Achieved      OT LONG  TERM GOAL #2   Title  Pt will require Max assist from caregiver who will be able to apply multilayer, gradient compression wraps using correct techniques after skilled training to reduce limb volume and limity LE progression.    Baseline  dependent    Time  4    Period  Days    Status  Achieved      OT LONG TERM GOAL #3   Title  Pt will achieve 10% limb volume reductions  bilaterally from ankle to tibial tuberosity during Intensive Phase CDT to return limbs to typical size and shape ,. reduce infection risk and improve basic andf instrumental ALDs performance.    Baseline  dependent  01/04/19: achieved 12/31/18 for RLE with 26.73% limb volume reduction below the knee.    Time  12    Period  Weeks    Status  Partially Met      OT LONG TERM GOAL #4   Title   Pt will consistently perform all LE self -care home protocols ( simple self MLD, skin care, therapeutic exercise, compression therapies) daily  with max CG assistance to to achieve max edema reduction, to enanble functional improvements,  including fitting preferred street shoes and standard sized clothing, and to achieve increased AROM and reduced falls risk.    Baseline  dependent 01/04/19: Pt unable to reach feet and distal legs to perform self MLD due to AROM limited by body habitus. Pt will benefit from Flexitouch sequential 01/11/19 : Pt reports ~85% compliance. pneumatic compression device ("pump") for long term LE self management.    Period  Weeks    Status  Partially Met      OT LONG TERM GOAL #5   Title  Patient will understand the importance of weight management, increased activity level, and leg elevation for optimal control of limb swelling over time.    Baseline  dependent    Time  12    Period  Weeks    Status  Achieved      OT LONG TERM GOAL #6   Title  During self-management phase of CDT Pt will retain volumetric reduction bilaterally over 6 month visit interval with no greater than 3% upward fluctuation to limit progression of chronic leg swelling and related skin changes, and to reduce infection risk.    Baseline  Max A    Time  6    Period  Months    Status  New    Target Date  07/20/19            Plan - 01/25/19 1229    Clinical Impression Statement  RLE with moderate skin flaking after removing wraps this morning as a result of ongoing limb volume reduction during Intensive Phase CDT. Adding artiflex at ankle alleviated wraps     sliding down and bunching at ankle , resulting in kin irritation at anterior ankle. No visible reduction or palpable softening at LLE yet with MLD alone. Will continue to wrap RLE until custom garments are fitted to ensure clinical gains are retained while initiating manual therapy to LLE. Cont as per POC.    OT Occupational Profile and  History  Comprehensive Assessment- Review of records and extensive additional review of physical, cognitive, psychosocial history related to current functional performance    Occupational performance deficits (Please refer to evaluation for details):  ADL's;IADL's;Leisure;Social Participation;Rest and Sleep;Other;Work   Engineer, building services, productive activities, role performance   Body Structure / Function / Physical Skills  ADL;Obesity;Decreased knowledge  of precautions;Balance;Body mechanics;Decreased knowledge of use of DME;Flexibility;IADL;Pain;Skin integrity;Strength;Edema;Gait;Mobility;ROM    Rehab Potential  Good    Clinical Decision Making  Multiple treatment options, significant modification of task necessary    Comorbidities Affecting Occupational Performance:  Presence of comorbidities impacting occupational performance    Comorbidities impacting occupational performance description:  see SUBJECTIVE    Modification or Assistance to Complete Evaluation   Max significant modification of tasks or assist is necessary to complete    OT Frequency  3x / week    OT Duration  12 weeks   consider reducing to 2 x weekly depending on level of and cinsistency of CG support   OT Treatment/Interventions  Self-care/ADL training;Therapeutic exercise;DME and/or AE instruction;Therapist, nutritional;Compression bandaging;Other (comment);Manual Therapy;Patient/family education;Therapeutic activities;Manual lymph drainage;Energy conservation    Plan  Intensive Phase CDT= Manual lymphatic drainage, (MLD) skin care to improve tissue health, therapeutic exercise (lymphatic pumping ther ex) and gradient compression wrapping one leg at a time with short stretch compression bandages (knee length in Ms. Grave's case) . Pt edu for LE self care  is ongoing and intensive . Management Pgase CDT is on follow-up PRN basis to support management over time, replacing compression garments, etc.    Recommended Other Services  fit  with BLE custom knee high compression stockings, toe caps and convoluted foam HOS devices to limit fibrosis formation ( progression) during HOS    Consulted and Agree with Plan of Care  Patient       Patient will benefit from skilled therapeutic intervention in order to improve the following deficits and impairments:   Body Structure / Function / Physical Skills: ADL, Obesity, Decreased knowledge of precautions, Balance, Body mechanics, Decreased knowledge of use of DME, Flexibility, IADL, Pain, Skin integrity, Strength, Edema, Gait, Mobility, ROM       Visit Diagnosis: Lymphedema, not elsewhere classified    Problem List There are no active problems to display for this patient.   Andrey Spearman, MS, OTR/L, [facility] 01/25/19 12:33 PM  [place] MAIN [facility] SERVICES [address] [place], [place], [address] Phone: [phone]   Fax:  [phone]  Name: LILAS DIEFENDORF MRN: [id number] Date of Birth: Dec 09, 1955

## 2019-01-27 ENCOUNTER — Ambulatory Visit: Payer: Medicare Other | Admitting: Occupational Therapy

## 2019-01-27 ENCOUNTER — Other Ambulatory Visit: Payer: Self-pay

## 2019-01-27 DIAGNOSIS — I890 Lymphedema, not elsewhere classified: Secondary | ICD-10-CM | POA: Diagnosis not present

## 2019-01-27 NOTE — Therapy (Signed)
[place] MAIN [facility] SERVICES [address] [place], [place], [address] Phone: [phone]   Fax:  [phone]  Occupational Therapy Treatment  Patient Details  Name: Nancy Riley MRN: [id number] Date of Birth: June 06, 1955 Referring Provider (OT): Delight Stare, MD   Encounter Date: 01/27/2019  OT End of Session - 01/27/19 1012    Visit Number  20    Number of Visits  36    Date for OT Re-Evaluation  03/02/19    OT Start Time  1005    OT Stop Time  1100    OT Time Calculation (min)  55 min    Activity Tolerance  Patient tolerated treatment well;No increased pain   limited by body habitus. Unable to reach feet and lower legs 2/2 girth   Behavior During Therapy  [facility] for tasks assessed/performed       Past Medical History:  Diagnosis Date  . Diabetes mellitus without complication ([place])   . Hypercholesteremia   . Hypertension     Past Surgical History:  Procedure Laterality Date  . BREAST EXCISIONAL BIOPSY Left 1970   negative  . COLONOSCOPY      There were no vitals filed for this visit.  Subjective Assessment - 01/27/19 1012    Subjective   [name] presents in transport wc for OT Rx day 20/ 36 to address BLE lymphedema . Pt presents with multilayer compression wrap in place below the knee.Pt reports , "I got a notice that a package is on the way. I hope it's my stocking."    Pertinent History  Dx relevant to lymphedema (LE): HTN, DM, neuropathy, DJD, arthritis, sciatica,  chronic back pain, morbid obesity, (BMI 50-59.9)    Repetition  Increases Symptoms    Special Tests  strong + Stemmer sign at base of toes bilaterally    Pain Onset  Other (comment)   onset "years ago"                  OT Treatments/Exercises (OP) - 01/27/19 0001      ADLs   ADL Education Given  Yes      Manual Therapy   Manual Therapy  Edema management;Manual Lymphatic Drainage (MLD);Compression Bandaging    Edema Management  skin care as  establlished    Manual Lymphatic Drainage (MLD)  MLD to LLE using short neck sequence , deep diaphragmatic breathing and functional inguinal pathways.    Compression Bandaging  RLE cmpression wraps as established             OT Education - 01/27/19 1017    Education Details  Continued skilled Pt/caregiver education  And LE ADL training throughout visit for lymphedema self care/ home program, including compression wrapping, compression garment and device wear/care, lymphatic pumping ther ex, simple self-MLD, and skin care. Discussed progress towards goals.    Person(s) Educated  Patient    Methods  Explanation;Demonstration;Tactile cues;Verbal cues;Handout    Comprehension  Verbalized understanding;Returned demonstration;Need further instruction          OT Long Term Goals - 01/21/19 0911      OT LONG TERM GOAL #1   Title  Pt will demonstrate understanding of lymphedema precautions and signs/ symptoms of cellulitisby identifying 6 items for each using printed Lymphedema Workbook for reference PRN (modified independence) to limit LE progression and infection risk.    Baseline  Max A    Time  4    Period  Days    Status  Achieved      OT LONG TERM GOAL #2   Title  Pt will require Max assist from caregiver who will be able to apply multilayer, gradient compression wraps using correct techniques after skilled training to reduce limb volume and limity LE progression.    Baseline  dependent    Time  4    Period  Days    Status  Achieved      OT LONG TERM GOAL #3   Title  Pt will achieve 10% limb volume reductions  bilaterally from ankle to tibial tuberosity during Intensive Phase CDT to return limbs to typical size and shape ,. reduce infection risk and improve basic andf instrumental ALDs performance.    Baseline  dependent  01/04/19: achieved 12/31/18 for RLE with 26.73% limb volume reduction below the knee.    Time  12    Period  Weeks    Status  Partially Met      OT LONG TERM  GOAL #4   Title  Pt will consistently perform all LE self -care home protocols ( simple self MLD, skin care, therapeutic exercise, compression therapies) daily  with max CG assistance to to achieve max edema reduction, to enanble functional improvements,  including fitting preferred street shoes and standard sized clothing, and to achieve increased AROM and reduced falls risk.    Baseline  dependent 01/04/19: Pt unable to reach feet and distal legs to perform self MLD due to AROM limited by body habitus. Pt will benefit from Flexitouch sequential 01/11/19 : Pt reports ~85% compliance. pneumatic compression device ("pump") for long term LE self management.    Period  Weeks    Status  Partially Met      OT LONG TERM GOAL #5   Title  Patient will understand the importance of weight management, increased activity level, and leg elevation for optimal control of limb swelling over time.    Baseline  dependent    Time  12    Period  Weeks    Status  Achieved      OT LONG TERM GOAL #6   Title  During self-management phase of CDT Pt will retain volumetric reduction bilaterally over 6 month visit interval with no greater than 3% upward fluctuation to limit progression of chronic leg swelling and related skin changes, and to reduce infection risk.    Baseline  Max A    Time  6    Period  Months    Status  New    Target Date  07/20/19            Plan - 01/27/19 1242    Clinical Impression Statement  Pt tolerated MLD, skin care and compression wrapping todaywithout difficulty. We spent at least 30 minutes during session reviewing LE self care home program and progress towards OT goals. Pt has made steady progress to volumetric reduction goal and continues to work on compliance with self care. seSee  Rx note dated 01/21/19 for more detailed information. Cont as pwer POC. Commence Intensive Phase CDT to LLE as soon as RLE custom compression is fitted.    OT Occupational Profile and History  Comprehensive  Assessment- Review of records and extensive additional review of physical, cognitive, psychosocial history related to current functional performance    Occupational performance deficits (Please refer to evaluation for details):  ADL's;IADL's;Leisure;Social Participation;Rest and Sleep;Other;Work   Engineer, building services, productive activities, role performance   Body Structure / Function / Physical Skills  ADL;Obesity;Decreased knowledge of  precautions;Balance;Body mechanics;Decreased knowledge of use of DME;Flexibility;IADL;Pain;Skin integrity;Strength;Edema;Gait;Mobility;ROM    Rehab Potential  Good    Clinical Decision Making  Multiple treatment options, significant modification of task necessary    Comorbidities Affecting Occupational Performance:  Presence of comorbidities impacting occupational performance    Comorbidities impacting occupational performance description:  see SUBJECTIVE    Modification or Assistance to Complete Evaluation   Max significant modification of tasks or assist is necessary to complete    OT Frequency  3x / week    OT Duration  12 weeks   consider reducing to 2 x weekly depending on level of and cinsistency of CG support   OT Treatment/Interventions  Self-care/ADL training;Therapeutic exercise;DME and/or AE instruction;Therapist, nutritional;Compression bandaging;Other (comment);Manual Therapy;Patient/family education;Therapeutic activities;Manual lymph drainage;Energy conservation    Plan  Intensive Phase CDT= Manual lymphatic drainage, (MLD) skin care to improve tissue health, therapeutic exercise (lymphatic pumping ther ex) and gradient compression wrapping one leg at a time with short stretch compression bandages (knee length in Ms. Grave's case) . Pt edu for LE self care  is ongoing and intensive . Management Pgase CDT is on follow-up PRN basis to support management over time, replacing compression garments, etc.    Recommended Other Services  fit with BLE custom knee  high compression stockings, toe caps and convoluted foam HOS devices to limit fibrosis formation ( progression) during HOS    Consulted and Agree with Plan of Care  Patient       Patient will benefit from skilled therapeutic intervention in order to improve the following deficits and impairments:   Body Structure / Function / Physical Skills: ADL, Obesity, Decreased knowledge of precautions, Balance, Body mechanics, Decreased knowledge of use of DME, Flexibility, IADL, Pain, Skin integrity, Strength, Edema, Gait, Mobility, ROM       Visit Diagnosis: Lymphedema, not elsewhere classified    Problem List There are no active problems to display for this patient.  Andrey Spearman, MS, OTR/L, [facility] 01/27/19 12:45 PM  [place] MAIN [facility] SERVICES [address] [place], [place], [address] Phone: [phone]   Fax:  [phone]  Name: JANAT TABBERT MRN: [id number] Date of Birth: 08-May-1956

## 2019-01-28 ENCOUNTER — Other Ambulatory Visit: Payer: Self-pay

## 2019-01-28 ENCOUNTER — Ambulatory Visit: Payer: Medicare Other | Admitting: Occupational Therapy

## 2019-01-28 DIAGNOSIS — I890 Lymphedema, not elsewhere classified: Secondary | ICD-10-CM | POA: Diagnosis not present

## 2019-01-28 NOTE — Therapy (Signed)
[place] [facility] SERVICES [address] [place], [place], [address] Phone: [phone]   Fax:  [phone]  Occupational Therapy Treatment  Patient Details  Name: Nancy Riley Date of Birth: [birth year] Referring Provider (OT): Delight Stare, MD   Encounter Date: 01/28/2019  OT End of Session - 01/28/19 1019    Visit Number  21    Number of Visits  36    Date for OT Re-Evaluation  03/02/19    OT Start Time  0900    OT Stop Time  1000    OT Time Calculation (min)  60 min    Activity Tolerance  Patient tolerated treatment well;No increased pain   limited by body habitus. Unable to reach feet and lower legs 2/2 girth   Behavior During Therapy  [facility] for tasks assessed/performed       Past Medical History:  Diagnosis Date  . Diabetes mellitus without complication ([place])   . Hypercholesteremia   . Hypertension     Past Surgical History:  Procedure Laterality Date  . BREAST EXCISIONAL BIOPSY Left 1970   negative  . COLONOSCOPY      There were no vitals filed for this visit.  Subjective Assessment - 01/28/19 0904    Subjective   Nancy Riley presents in transport wc for OT Rx day 21/ 36 to address BLE lymphedema . Pt presents with multilayer compression wrap in place below the knee. Pt has no new complaints or concerns this morning.    Pertinent History  Dx relevant to lymphedema (LE): HTN, DM, neuropathy, DJD, arthritis, sciatica,  chronic back pain, morbid obesity, (BMI 50-59.9)    Repetition  Increases Symptoms    Special Tests  strong + Stemmer sign at base of toes bilaterally    Currently in Pain?  Yes   chronic back pain ongoing. not rated numerically today   Pain Onset  Other (comment)   onset "years ago"                  OT Treatments/Exercises (OP) - 01/28/19 0001      ADLs   ADL Education Given  Yes  (Pended)       Manual Therapy   Manual Therapy  Edema management;Compression Bandaging;Manual  Lymphatic Drainage (MLD)  (Pended)     Edema Management  skin care as establlished  (Pended)     Manual Lymphatic Drainage (MLD)  MLD to LLE using short neck sequence , deep diaphragmatic breathing and functional inguinal pathways.  (Pended)     Compression Bandaging  RLE cmpression wraps as established  (Pended)              OT Education - 01/28/19 1018    Education Details  Continued skilled Pt/caregiver education  And LE ADL training throughout visit for lymphedema self care/ home program, including compression wrapping, compression garment and device wear/care, lymphatic pumping ther ex, simple self-MLD, and skin care. Discussed progress towards goals.    Person(s) Educated  Patient    Methods  Explanation;Demonstration;Tactile cues;Verbal cues;Handout    Comprehension  Verbalized understanding;Returned demonstration;Need further instruction          OT Long Term Goals - 01/21/19 0911      OT LONG TERM GOAL #1   Title  Pt will demonstrate understanding of lymphedema precautions and signs/ symptoms of cellulitisby identifying 6 items for each using printed Lymphedema Workbook for reference PRN (modified independence) to limit LE progression and infection risk.  Baseline  Max A    Time  4    Period  Days    Status  Achieved      OT LONG TERM GOAL #2   Title  Pt will require Max assist from caregiver who will be able to apply multilayer, gradient compression wraps using correct techniques after skilled training to reduce limb volume and limity LE progression.    Baseline  dependent    Time  4    Period  Days    Status  Achieved      OT LONG TERM GOAL #3   Title  Pt will achieve 10% limb volume reductions  bilaterally from ankle to tibial tuberosity during Intensive Phase CDT to return limbs to typical size and shape ,. reduce infection risk and improve basic andf instrumental ALDs performance.    Baseline  dependent  01/04/19: achieved 12/31/18 for RLE with 26.73% limb  volume reduction below the knee.    Time  12    Period  Weeks    Status  Partially Met      OT LONG TERM GOAL #4   Title  Pt will consistently perform all LE self -care home protocols ( simple self MLD, skin care, therapeutic exercise, compression therapies) daily  with max CG assistance to to achieve max edema reduction, to enanble functional improvements,  including fitting preferred street shoes and standard sized clothing, and to achieve increased AROM and reduced falls risk.    Baseline  dependent 01/04/19: Pt unable to reach feet and distal legs to perform self MLD due to AROM limited by body habitus. Pt will benefit from Flexitouch sequential 01/11/19 : Pt reports ~85% compliance. pneumatic compression device ("pump") for long term LE self management.    Period  Weeks    Status  Partially Met      OT LONG TERM GOAL #5   Title  Patient will understand the importance of weight management, increased activity level, and leg elevation for optimal control of limb swelling over time.    Baseline  dependent    Time  12    Period  Weeks    Status  Achieved      OT LONG TERM GOAL #6   Title  During self-management phase of CDT Pt will retain volumetric reduction bilaterally over 6 month visit interval with no greater than 3% upward fluctuation to limit progression of chronic leg swelling and related skin changes, and to reduce infection risk.    Baseline  Max A    Time  6    Period  Months    Status  New    Target Date  07/20/19            Plan - 01/28/19 1020    Clinical Impression Statement  Pt fully engaged throughout session. No difficulty tolerating MLD, skin care and compression therapy to RLE. RLE appears to be stable volumetrically. Tissue fibrosis at distal leg and ankle have softened considerably with Rx, but circumferential measurements appear to have reached clinical plateau in regards to further reductions. CVont as per POC.    OT Occupational Profile and History   Comprehensive Assessment- Review of records and extensive additional review of physical, cognitive, psychosocial history related to current functional performance    Occupational performance deficits (Please refer to evaluation for details):  ADL's;IADL's;Leisure;Social Participation;Rest and Sleep;Other;Work   Engineer, building services, productive activities, role performance   Body Structure / Function / Physical Skills  ADL;Obesity;Decreased knowledge of precautions;Balance;Body mechanics;Decreased  knowledge of use of DME;Flexibility;IADL;Pain;Skin integrity;Strength;Edema;Gait;Mobility;ROM    Rehab Potential  Good    Clinical Decision Making  Multiple treatment options, significant modification of task necessary    Comorbidities Affecting Occupational Performance:  Presence of comorbidities impacting occupational performance    Comorbidities impacting occupational performance description:  see SUBJECTIVE    Modification or Assistance to Complete Evaluation   Max significant modification of tasks or assist is necessary to complete    OT Frequency  3x / week    OT Duration  12 weeks   consider reducing to 2 x weekly depending on level of and cinsistency of CG support   OT Treatment/Interventions  Self-care/ADL training;Therapeutic exercise;DME and/or AE instruction;Therapist, nutritional;Compression bandaging;Other (comment);Manual Therapy;Patient/family education;Therapeutic activities;Manual lymph drainage;Energy conservation    Plan  Intensive Phase CDT= Manual lymphatic drainage, (MLD) skin care to improve tissue health, therapeutic exercise (lymphatic pumping ther ex) and gradient compression wrapping one leg at a time with short stretch compression bandages (knee length in Ms. Grave's case) . Pt edu for LE self care  is ongoing and intensive . Management Pgase CDT is on follow-up PRN basis to support management over time, replacing compression garments, etc.    Recommended Other Services  fit with BLE  custom knee high compression stockings, toe caps and convoluted foam HOS devices to limit fibrosis formation ( progression) during HOS    Consulted and Agree with Plan of Care  Patient       Patient will benefit from skilled therapeutic intervention in order to improve the following deficits and impairments:   Body Structure / Function / Physical Skills: ADL, Obesity, Decreased knowledge of precautions, Balance, Body mechanics, Decreased knowledge of use of DME, Flexibility, IADL, Pain, Skin integrity, Strength, Edema, Gait, Mobility, ROM       Visit Diagnosis: Lymphedema, not elsewhere classified    Problem List There are no active problems to display for this patient.   Andrey Spearman, MS, OTR/L, [facility] 01/28/19 10:24 AM  [place] [facility] SERVICES [address] [place], [place], [address] Phone: [phone]   Fax:  [phone]  Name: Nancy Riley Date of Birth: 1956/02/12

## 2019-02-01 ENCOUNTER — Ambulatory Visit: Payer: Medicare Other | Admitting: Occupational Therapy

## 2019-02-01 ENCOUNTER — Other Ambulatory Visit: Payer: Self-pay

## 2019-02-01 DIAGNOSIS — I890 Lymphedema, not elsewhere classified: Secondary | ICD-10-CM

## 2019-02-01 NOTE — Therapy (Signed)
[place] [facility] SERVICES [address] [place], [place], [address] Phone: [phone]   Fax:  [phone]  Occupational Therapy Treatment  Patient Details  Name: Nancy Riley MRN: [id number] Date of Birth: 10/25/1955 Referring Provider (OT): Linda Miles, MD   Encounter Date: 02/01/2019  OT End of Session - 02/01/19 1221    Visit Number  22    Number of Visits  36    Date for OT Re-Evaluation  03/02/19    OT Start Time  0955    OT Stop Time  1100    OT Time Calculation (min)  65 min    Activity Tolerance  Patient tolerated treatment well;No increased pain   limited by body habitus. Unable to reach feet and lower legs 2/2 girth   Behavior During Therapy  WFL for tasks assessed/performed       Past Medical History:  Diagnosis Date  . Diabetes mellitus without complication ([facility])   . Hypercholesteremia   . Hypertension     Past Surgical History:  Procedure Laterality Date  . BREAST EXCISIONAL BIOPSY Left 1970   negative  . COLONOSCOPY      There were no vitals filed for this visit.  Subjective Assessment - 02/01/19 0938    Subjective   [name] presents in transport wc for OT Rx day 22/ 36 to address BLE lymphedema . Pt presents with multilayer compression wrap in place below the knee. Pt has no new complaints or concerns re LE.    Pertinent History  Dx relevant to lymphedema (LE): HTN, DM, neuropathy, DJD, arthritis, sciatica,  chronic back pain, morbid obesity, (BMI 50-59.9)    Repetition  Increases Symptoms    Special Tests  strong + Stemmer sign at base of toes bilaterally    Pain Onset  Other (comment)   onset "years ago"                  OT Treatments/Exercises (OP) - 02/01/19 0001      ADLs   ADL Education Given  Yes      Manual Therapy   Manual Therapy  Edema management;Compression Bandaging;Manual Lymphatic Drainage (MLD)    Edema Management  skin care as establlished    Manual Lymphatic Drainage  (MLD)  MLD to LLE using short neck sequence , deep diaphragmatic breathing and functional inguinal pathways.    Compression Bandaging  RLE cmpression wraps as established             OT Education - 02/01/19 0939    Education Details  ADL training for simple self MLD- short neck sequence - minA w/ VC    Person(s) Educated  Patient    Methods  Explanation;Demonstration;Tactile cues;Verbal cues;Handout    Comprehension  Verbalized understanding;Returned demonstration;Need further instruction          OT Long Term Goals - 01/21/19 0911      OT LONG TERM GOAL #1   Title  Pt will demonstrate understanding of lymphedema precautions and signs/ symptoms of cellulitisby identifying 6 items for each using printed Lymphedema Workbook for reference PRN (modified independence) to limit LE progression and infection risk.    Baseline  Max A    Time  4    Period  Days    Status  Achieved      OT LONG TERM GOAL #2   Title  Pt will require Max assist from caregiver who will be able to apply multilayer, gradient compression wraps using correct   techniques after skilled training to reduce limb volume and limity LE progression.    Baseline  dependent    Time  4    Period  Days    Status  Achieved      OT LONG TERM GOAL #3   Title  Pt will achieve 10% limb volume reductions  bilaterally from ankle to tibial tuberosity during Intensive Phase CDT to return limbs to typical size and shape ,. reduce infection risk and improve basic andf instrumental ALDs performance.    Baseline  dependent  01/04/19: achieved 12/31/18 for RLE with 26.73% limb volume reduction below the knee.    Time  12    Period  Weeks    Status  Partially Met      OT LONG TERM GOAL #4   Title  Pt will consistently perform all LE self -care home protocols ( simple self MLD, skin care, therapeutic exercise, compression therapies) daily  with max CG assistance to to achieve max edema reduction, to enanble functional improvements,   including fitting preferred street shoes and standard sized clothing, and to achieve increased AROM and reduced falls risk.    Baseline  dependent 01/04/19: Pt unable to reach feet and distal legs to perform self MLD due to AROM limited by body habitus. Pt will benefit from Flexitouch sequential 01/11/19 : Pt reports ~85% compliance. pneumatic compression device ("pump") for long term LE self management.    Period  Weeks    Status  Partially Met      OT LONG TERM GOAL #5   Title  Patient will understand the importance of weight management, increased activity level, and leg elevation for optimal control of limb swelling over time.    Baseline  dependent    Time  12    Period  Weeks    Status  Achieved      OT LONG TERM GOAL #6   Title  During self-management phase of CDT Pt will retain volumetric reduction bilaterally over 6 month visit interval with no greater than 3% upward fluctuation to limit progression of chronic leg swelling and related skin changes, and to reduce infection risk.    Baseline  Max A    Time  6    Period  Months    Status  New    Target Date  07/20/19            Plan - 02/01/19 1222    Clinical Impression Statement  Continued LLE MLD today in prep for shifying manual therapy one RLE custom compression garments are fitted. RLE swelling is well managed between visits. Fatty fibrosis persists, but is 50%  less dense at distal leg and ankle by palpation. LLE tissue density in posterior calf and distal leg is slightly decreased this morning, despite no compression yet used on this limb. Cont as per POC. Pt demonstrates good progress towards all Intensive Phase OT goals.    OT Occupational Profile and History  Comprehensive Assessment- Review of records and extensive additional review of physical, cognitive, psychosocial history related to current functional performance    Occupational performance deficits (Please refer to evaluation for details):   ADL's;IADL's;Leisure;Social Participation;Rest and Sleep;Other;Work   Engineer, building services, productive activities, role performance   Body Structure / Function / Physical Skills  ADL;Obesity;Decreased knowledge of precautions;Balance;Body mechanics;Decreased knowledge of use of DME;Flexibility;IADL;Pain;Skin integrity;Strength;Edema;Gait;Mobility;ROM    Rehab Potential  Good    Clinical Decision Making  Multiple treatment options, significant modification of task necessary  Comorbidities Affecting Occupational Performance:  Presence of comorbidities impacting occupational performance    Comorbidities impacting occupational performance description:  see SUBJECTIVE    Modification or Assistance to Complete Evaluation   Max significant modification of tasks or assist is necessary to complete    OT Frequency  3x / week    OT Duration  12 weeks   consider reducing to 2 x weekly depending on level of and cinsistency of CG support   OT Treatment/Interventions  Self-care/ADL training;Therapeutic exercise;DME and/or AE instruction;Functional Mobility Training;Compression bandaging;Other (comment);Manual Therapy;Patient/family education;Therapeutic activities;Manual lymph drainage;Energy conservation    Plan  Intensive Phase CDT= Manual lymphatic drainage, (MLD) skin care to improve tissue health, therapeutic exercise (lymphatic pumping ther ex) and gradient compression wrapping one leg at a time with short stretch compression bandages (knee length in Ms. Grave's case) . Pt edu for LE self care  is ongoing and intensive . Management Pgase CDT is on follow-up PRN basis to support management over time, replacing compression garments, etc.    Recommended Other Services  fit with BLE custom knee high compression stockings, toe caps and convoluted foam HOS devices to limit fibrosis formation ( progression) during HOS    Consulted and Agree with Plan of Care  Patient       Patient will benefit from skilled therapeutic  intervention in order to improve the following deficits and impairments:   Body Structure / Function / Physical Skills: ADL, Obesity, Decreased knowledge of precautions, Balance, Body mechanics, Decreased knowledge of use of DME, Flexibility, IADL, Pain, Skin integrity, Strength, Edema, Gait, Mobility, ROM       Visit Diagnosis: Lymphedema, not elsewhere classified    Problem List There are no active problems to display for this patient.   Theresa Gilliam, MS, OTR/L, CLT-LANA 02/01/19 12:26 PM  [place] [facility] [address] [place], [place], [address] Phone: [phone]   Fax:  [phone]  Name: Falicia B Kinnick MRN: [id number] Date of Birth: 01/20/1956 

## 2019-02-03 ENCOUNTER — Ambulatory Visit: Payer: Medicare Other | Attending: Family Medicine | Admitting: Occupational Therapy

## 2019-02-03 ENCOUNTER — Other Ambulatory Visit: Payer: Self-pay

## 2019-02-03 DIAGNOSIS — I890 Lymphedema, not elsewhere classified: Secondary | ICD-10-CM | POA: Diagnosis present

## 2019-02-03 NOTE — Therapy (Signed)
[place] MAIN [facility] SERVICES [address] [place], [place], [address] Phone: [phone]   Fax:  [phone]  Occupational Therapy Treatment  Patient Details  Name: Nancy Riley MRN: [id number] Date of Birth: [birth year] Referring Provider (OT): Delight Stare, MD   Encounter Date: 02/03/2019  OT End of Session - 02/03/19 1242    Visit Number  23    Number of Visits  36    Date for OT Re-Evaluation  03/02/19    OT Start Time  1005    OT Stop Time  1109    OT Time Calculation (min)  64 min    Activity Tolerance  Patient tolerated treatment well;No increased pain   limited by body habitus. Unable to reach feet and lower legs 2/2 girth   Behavior During Therapy  [facility] for tasks assessed/performed       Past Medical History:  Diagnosis Date  . Diabetes mellitus without complication ([place])   . Hypercholesteremia   . Hypertension     Past Surgical History:  Procedure Laterality Date  . BREAST EXCISIONAL BIOPSY Left 1970   negative  . COLONOSCOPY      There were no vitals filed for this visit.  Subjective Assessment - 02/03/19 1020    Subjective   Nancy Riley presents in transport wc for OT Rx day 23/ 36 to address BLE lymphedema . Pt presents with multilayer compression wrap in place below the knee. Pt has no new complaints or concerns re LE. Typical body aches and back pain unchanged.    Pertinent History  Dx relevant to lymphedema (LE): HTN, DM, neuropathy, DJD, arthritis, sciatica,  chronic back pain, morbid obesity, (BMI 50-59.9)    Repetition  Increases Symptoms    Special Tests  strong + Stemmer sign at base of toes bilaterally    Currently in Pain?  Yes   unchanged, not rated   Pain Onset  Other (comment)   onset "years ago"                  OT Treatments/Exercises (OP) - 02/03/19 0001      ADLs   ADL Education Given  Yes  (Pended)       Manual Therapy   Manual Therapy  Edema management;Manual Lymphatic Drainage  (MLD);Compression Bandaging  (Pended)     Edema Management  skin care as establlished  (Pended)     Manual Lymphatic Drainage (MLD)  MLD to LLE using short neck sequence , deep diaphragmatic breathing and functional inguinal pathways.  (Pended)     Compression Bandaging  RLE cmpression wraps as established  (Pended)              OT Education - 02/03/19 1241    Education Details  Continued skilled Pt/caregiver education  And LE ADL training throughout visit for lymphedema self care/ home program, including compression wrapping, compression garment and device wear/care, lymphatic pumping ther ex, simple self-MLD, and skin care. Discussed progress towards goals.    Person(s) Educated  Patient    Methods  Explanation;Demonstration;Tactile cues;Verbal cues;Handout    Comprehension  Verbalized understanding;Returned demonstration;Need further instruction          OT Long Term Goals - 01/21/19 0911      OT LONG TERM GOAL #1   Title  Pt will demonstrate understanding of lymphedema precautions and signs/ symptoms of cellulitisby identifying 6 items for each using printed Lymphedema Workbook for reference PRN (modified independence) to limit LE progression and infection  risk.    Baseline  Max A    Time  4    Period  Days    Status  Achieved      OT LONG TERM GOAL #2   Title  Pt will require Max assist from caregiver who will be able to apply multilayer, gradient compression wraps using correct techniques after skilled training to reduce limb volume and limity LE progression.    Baseline  dependent    Time  4    Period  Days    Status  Achieved      OT LONG TERM GOAL #3   Title  Pt will achieve 10% limb volume reductions  bilaterally from ankle to tibial tuberosity during Intensive Phase CDT to return limbs to typical size and shape ,. reduce infection risk and improve basic andf instrumental ALDs performance.    Baseline  dependent  01/04/19: achieved 12/31/18 for RLE with 26.73% limb  volume reduction below the knee.    Time  12    Period  Weeks    Status  Partially Met      OT LONG TERM GOAL #4   Title  Pt will consistently perform all LE self -care home protocols ( simple self MLD, skin care, therapeutic exercise, compression therapies) daily  with max CG assistance to to achieve max edema reduction, to enanble functional improvements,  including fitting preferred street shoes and standard sized clothing, and to achieve increased AROM and reduced falls risk.    Baseline  dependent 01/04/19: Pt unable to reach feet and distal legs to perform self MLD due to AROM limited by body habitus. Pt will benefit from Flexitouch sequential 01/11/19 : Pt reports ~85% compliance. pneumatic compression device ("pump") for long term LE self management.    Period  Weeks    Status  Partially Met      OT LONG TERM GOAL #5   Title  Patient will understand the importance of weight management, increased activity level, and leg elevation for optimal control of limb swelling over time.    Baseline  dependent    Time  12    Period  Weeks    Status  Achieved      OT LONG TERM GOAL #6   Title  During self-management phase of CDT Pt will retain volumetric reduction bilaterally over 6 month visit interval with no greater than 3% upward fluctuation to limit progression of chronic leg swelling and related skin changes, and to reduce infection risk.    Baseline  Max A    Time  6    Period  Months    Status  New    Target Date  07/20/19            Plan - 02/03/19 1242    Clinical Impression Statement  Re-applied knee length, gradient compression wraps to RLE after providng MLD and skin care to LLE. Pt agrees with OT observation that , by visual assessment, LLE appears to have decreased in volume despite not yet commencing compression wrapping to that limb. Pt tolerating all aspects of CDT and demonstrates excellent complance with home program with assistance from family members PRN. Cont as per  POC.    OT Occupational Profile and History  Comprehensive Assessment- Review of records and extensive additional review of physical, cognitive, psychosocial history related to current functional performance    Occupational performance deficits (Please refer to evaluation for details):  ADL's;IADL's;Leisure;Social Participation;Rest and Sleep;Other;Work   body image, productive activities,  role performance   Body Structure / Function / Physical Skills  ADL;Obesity;Decreased knowledge of precautions;Balance;Body mechanics;Decreased knowledge of use of DME;Flexibility;IADL;Pain;Skin integrity;Strength;Edema;Gait;Mobility;ROM    Rehab Potential  Good    Clinical Decision Making  Multiple treatment options, significant modification of task necessary    Comorbidities Affecting Occupational Performance:  Presence of comorbidities impacting occupational performance    Comorbidities impacting occupational performance description:  see SUBJECTIVE    Modification or Assistance to Complete Evaluation   Max significant modification of tasks or assist is necessary to complete    OT Frequency  3x / week    OT Duration  12 weeks   consider reducing to 2 x weekly depending on level of and cinsistency of CG support   OT Treatment/Interventions  Self-care/ADL training;Therapeutic exercise;DME and/or AE instruction;Therapist, nutritional;Compression bandaging;Other (comment);Manual Therapy;Patient/family education;Therapeutic activities;Manual lymph drainage;Energy conservation    Plan  Intensive Phase CDT= Manual lymphatic drainage, (MLD) skin care to improve tissue health, therapeutic exercise (lymphatic pumping ther ex) and gradient compression wrapping one leg at a time with short stretch compression bandages (knee length in Ms. Grave's case) . Pt edu for LE self care  is ongoing and intensive . Management Pgase CDT is on follow-up PRN basis to support management over time, replacing compression garments, etc.     Recommended Other Services  fit with BLE custom knee high compression stockings, toe caps and convoluted foam HOS devices to limit fibrosis formation ( progression) during HOS    Consulted and Agree with Plan of Care  Patient       Patient will benefit from skilled therapeutic intervention in order to improve the following deficits and impairments:   Body Structure / Function / Physical Skills: ADL, Obesity, Decreased knowledge of precautions, Balance, Body mechanics, Decreased knowledge of use of DME, Flexibility, IADL, Pain, Skin integrity, Strength, Edema, Gait, Mobility, ROM       Visit Diagnosis: Lymphedema, not elsewhere classified    Problem List There are no active problems to display for this patient.   Andrey Spearman, MS, OTR/L, [facility] 02/03/19 12:45 PM  [place] MAIN [facility] SERVICES [address] [place], [place], [address] Phone: [phone]   Fax:  [phone]  Name: Nancy Riley MRN: [id number] Date of Birth: 1956-03-24

## 2019-02-04 ENCOUNTER — Other Ambulatory Visit: Payer: Self-pay

## 2019-02-04 ENCOUNTER — Ambulatory Visit: Payer: Medicare Other | Admitting: Occupational Therapy

## 2019-02-04 DIAGNOSIS — I890 Lymphedema, not elsewhere classified: Secondary | ICD-10-CM

## 2019-02-04 NOTE — Therapy (Signed)
[place] MAIN [facility] SERVICES [address] [place], [place], [address] Phone: [phone]   Fax:  [phone]  Occupational Therapy Treatment  Patient Details  Name: Nancy Riley MRN: [id number] Date of Birth: Apr 16, 1956 Referring Provider (OT): Delight Stare, MD   Encounter Date: 02/04/2019  OT End of Session - 02/04/19 1006    Visit Number  24    Number of Visits  36    Date for OT Re-Evaluation  03/02/19    OT Start Time  0900    OT Stop Time  0955    OT Time Calculation (min)  55 min    Activity Tolerance  Patient tolerated treatment well;No increased pain   limited by body habitus. Unable to reach feet and lower legs 2/2 girth   Behavior During Therapy  [facility] for tasks assessed/performed       Past Medical History:  Diagnosis Date  . Diabetes mellitus without complication ([place])   . Hypercholesteremia   . Hypertension     Past Surgical History:  Procedure Laterality Date  . BREAST EXCISIONAL BIOPSY Left 1970   negative  . COLONOSCOPY      There were no vitals filed for this visit.  Subjective Assessment - 02/04/19 0913    Subjective   [name] presents in transport wc for OT Rx day 24/ 36 to address BLE lymphedema . Pt presents with multilayer compression wrap in place below the knee. Pt denies concerns and new complaints. Compression garments have not yet been delivered.    Pertinent History  Dx relevant to lymphedema (LE): HTN, DM, neuropathy, DJD, arthritis, sciatica,  chronic back pain, morbid obesity, (BMI 50-59.9)    Repetition  Increases Symptoms    Special Tests  strong + Stemmer sign at base of toes bilaterally    Pain Onset  Other (comment)   onset "years ago"                  OT Treatments/Exercises (OP) - 02/04/19 0001      ADLs   ADL Education Given  Yes      Manual Therapy   Manual Therapy  Edema management;Manual Lymphatic Drainage (MLD);Compression Bandaging    Edema Management  skin care as  establlished    Manual Lymphatic Drainage (MLD)  MLD to LLE using short neck sequence , deep diaphragmatic breathing and functional inguinal pathways.    Compression Bandaging  RLE cmpression wraps as established             OT Education - 02/04/19 1006    Education Details  Continued skilled Pt/caregiver education  And LE ADL training throughout visit for lymphedema self care/ home program, including compression wrapping, compression garment and device wear/care, lymphatic pumping ther ex, simple self-MLD, and skin care. Discussed progress towards goals.    Person(s) Educated  Patient    Methods  Explanation;Demonstration;Tactile cues;Verbal cues;Handout    Comprehension  Verbalized understanding;Returned demonstration;Need further instruction          OT Long Term Goals - 01/21/19 0911      OT LONG TERM GOAL #1   Title  Pt will demonstrate understanding of lymphedema precautions and signs/ symptoms of cellulitisby identifying 6 items for each using printed Lymphedema Workbook for reference PRN (modified independence) to limit LE progression and infection risk.    Baseline  Max A    Time  4    Period  Days    Status  Achieved  OT LONG TERM GOAL #2   Title  Pt will require Max assist from caregiver who will be able to apply multilayer, gradient compression wraps using correct techniques after skilled training to reduce limb volume and limity LE progression.    Baseline  dependent    Time  4    Period  Days    Status  Achieved      OT LONG TERM GOAL #3   Title  Pt will achieve 10% limb volume reductions  bilaterally from ankle to tibial tuberosity during Intensive Phase CDT to return limbs to typical size and shape ,. reduce infection risk and improve basic andf instrumental ALDs performance.    Baseline  dependent  01/04/19: achieved 12/31/18 for RLE with 26.73% limb volume reduction below the knee.    Time  12    Period  Weeks    Status  Partially Met      OT LONG TERM  GOAL #4   Title  Pt will consistently perform all LE self -care home protocols ( simple self MLD, skin care, therapeutic exercise, compression therapies) daily  with max CG assistance to to achieve max edema reduction, to enanble functional improvements,  including fitting preferred street shoes and standard sized clothing, and to achieve increased AROM and reduced falls risk.    Baseline  dependent 01/04/19: Pt unable to reach feet and distal legs to perform self MLD due to AROM limited by body habitus. Pt will benefit from Flexitouch sequential 01/11/19 : Pt reports ~85% compliance. pneumatic compression device ("pump") for long term LE self management.    Period  Weeks    Status  Partially Met      OT LONG TERM GOAL #5   Title  Patient will understand the importance of weight management, increased activity level, and leg elevation for optimal control of limb swelling over time.    Baseline  dependent    Time  12    Period  Weeks    Status  Achieved      OT LONG TERM GOAL #6   Title  During self-management phase of CDT Pt will retain volumetric reduction bilaterally over 6 month visit interval with no greater than 3% upward fluctuation to limit progression of chronic leg swelling and related skin changes, and to reduce infection risk.    Baseline  Max A    Time  6    Period  Months    Status  New    Target Date  07/20/19            Plan - 02/04/19 1007    Clinical Impression Statement  Cont compression to RLE today while continuing MLD and skin care to LLE as establsed this week. Pt tolerating all CDT protocols without difficulty. RLE swelling is well managed, whioe LLE continues to fluctuate daily without compression, despite MLD.Marland Kitchen Cont as per POC.    OT Occupational Profile and History  Comprehensive Assessment- Review of records and extensive additional review of physical, cognitive, psychosocial history related to current functional performance    Occupational performance deficits  (Please refer to evaluation for details):  ADL's;IADL's;Leisure;Social Participation;Rest and Sleep;Other;Work   Engineer, building services, productive activities, role performance   Body Structure / Function / Physical Skills  ADL;Obesity;Decreased knowledge of precautions;Balance;Body mechanics;Decreased knowledge of use of DME;Flexibility;IADL;Pain;Skin integrity;Strength;Edema;Gait;Mobility;ROM    Rehab Potential  Good    Clinical Decision Making  Multiple treatment options, significant modification of task necessary    Comorbidities Affecting Occupational Performance:  Presence of comorbidities impacting occupational performance    Comorbidities impacting occupational performance description:  see SUBJECTIVE    Modification or Assistance to Complete Evaluation   Max significant modification of tasks or assist is necessary to complete    OT Frequency  3x / week    OT Duration  12 weeks   consider reducing to 2 x weekly depending on level of and cinsistency of CG support   OT Treatment/Interventions  Self-care/ADL training;Therapeutic exercise;DME and/or AE instruction;Therapist, nutritional;Compression bandaging;Other (comment);Manual Therapy;Patient/family education;Therapeutic activities;Manual lymph drainage;Energy conservation    Plan  Intensive Phase CDT= Manual lymphatic drainage, (MLD) skin care to improve tissue health, therapeutic exercise (lymphatic pumping ther ex) and gradient compression wrapping one leg at a time with short stretch compression bandages (knee length in Ms. Grave's case) . Pt edu for LE self care  is ongoing and intensive . Management Pgase CDT is on follow-up PRN basis to support management over time, replacing compression garments, etc.    Recommended Other Services  fit with BLE custom knee high compression stockings, toe caps and convoluted foam HOS devices to limit fibrosis formation ( progression) during HOS    Consulted and Agree with Plan of Care  Patient        Patient will benefit from skilled therapeutic intervention in order to improve the following deficits and impairments:   Body Structure / Function / Physical Skills: ADL, Obesity, Decreased knowledge of precautions, Balance, Body mechanics, Decreased knowledge of use of DME, Flexibility, IADL, Pain, Skin integrity, Strength, Edema, Gait, Mobility, ROM       Visit Diagnosis: Lymphedema, not elsewhere classified    Problem List There are no active problems to display for this patient.  Andrey Spearman, MS, OTR/L, [facility] 02/04/19 10:10 AM    [place] MAIN [facility] SERVICES [address] [place], [place], [address] Phone: [phone]   Fax:  [phone]  Name: JALEXA PIFER MRN: [id number] Date of Birth: 1956-04-23

## 2019-02-09 ENCOUNTER — Other Ambulatory Visit: Payer: Self-pay

## 2019-02-09 ENCOUNTER — Ambulatory Visit: Payer: Medicare Other | Admitting: Occupational Therapy

## 2019-02-09 DIAGNOSIS — I890 Lymphedema, not elsewhere classified: Secondary | ICD-10-CM

## 2019-02-09 NOTE — Therapy (Signed)
[place] MAIN [facility] SERVICES [address] [place], [place], [address] Phone: [phone]   Fax:  [phone]  Occupational Therapy Treatment  Patient Details  Name: Nancy Riley MRN: [id number] Date of Birth: August 14, 1955 Referring Provider (OT): Delight Stare, MD   Encounter Date: 02/09/2019  OT End of Session - 02/09/19 1129    Visit Number  25    Number of Visits  36    Date for OT Re-Evaluation  03/02/19    OT Start Time  1005    OT Stop Time  1115    OT Time Calculation (min)  70 min    Activity Tolerance  Patient tolerated treatment well;No increased pain   limited by body habitus. Unable to reach feet and lower legs 2/2 girth   Behavior During Therapy  [facility] for tasks assessed/performed       Past Medical History:  Diagnosis Date  . Diabetes mellitus without complication ([place] [facility])   . Hypercholesteremia   . Hypertension     Past Surgical History:  Procedure Laterality Date  . BREAST EXCISIONAL BIOPSY Left 1970   negative  . COLONOSCOPY      There were no vitals filed for this visit.  Subjective Assessment - 02/09/19 1125    Subjective   [name] presents in transport wc for OT Rx day 25/ 36 to address BLE lymphedema . Pt presents with multilayer compression wrap in place below the knee. Pt expresses concern re swelling above her knees and worsening mobility along w/ LE progression. Pt reports unrated pain in knees and back this morning after long weekend of increased activity.    Pertinent History  Dx relevant to lymphedema (LE): HTN, DM, neuropathy, DJD, arthritis, sciatica,  chronic back pain, morbid obesity, (BMI 50-59.9)    Repetition  Increases Symptoms    Special Tests  strong + Stemmer sign at base of toes bilaterally    Pain Onset  Other (comment)   onset "years ago"                  OT Treatments/Exercises (OP) - 02/09/19 0001      ADLs   ADL Education Given  Yes      Manual Therapy   Manual Therapy   Edema management;Manual Lymphatic Drainage (MLD);Compression Bandaging    Edema Management  skin care as establlished    Manual Lymphatic Drainage (MLD)  MLD to LLE using short neck sequence , deep diaphragmatic breathing and functional inguinal pathways.    Compression Bandaging  RLE cmpression wraps as established             OT Education - 02/09/19 1127    Education Details  Emphasis of Pt edu and LE ADL training on weight loss counseling to limit LE progression, reduce joint pain, and increase safe functional ambulation and mobility .Provided printed reference for Dr Enrigue Catena at [facility]  who is an Internal Medicine physician specializing in weight loss for bariatric patients with difficulty exercising.    Person(s) Educated  Patient    Methods  Explanation;Demonstration;Tactile cues;Verbal cues;Handout    Comprehension  Verbalized understanding;Returned demonstration;Need further instruction          OT Long Term Goals - 01/21/19 0911      OT LONG TERM GOAL #1   Title  Pt will demonstrate understanding of lymphedema precautions and signs/ symptoms of cellulitisby identifying 6 items for each using printed Lymphedema Workbook for reference PRN (modified independence) to limit  LE progression and infection risk.    Baseline  Max A    Time  4    Period  Days    Status  Achieved      OT LONG TERM GOAL #2   Title  Pt will require Max assist from caregiver who will be able to apply multilayer, gradient compression wraps using correct techniques after skilled training to reduce limb volume and limity LE progression.    Baseline  dependent    Time  4    Period  Days    Status  Achieved      OT LONG TERM GOAL #3   Title  Pt will achieve 10% limb volume reductions  bilaterally from ankle to tibial tuberosity during Intensive Phase CDT to return limbs to typical size and shape ,. reduce infection risk and improve basic andf instrumental ALDs performance.    Baseline   dependent  01/04/19: achieved 12/31/18 for RLE with 26.73% limb volume reduction below the knee.    Time  12    Period  Weeks    Status  Partially Met      OT LONG TERM GOAL #4   Title  Pt will consistently perform all LE self -care home protocols ( simple self MLD, skin care, therapeutic exercise, compression therapies) daily  with max CG assistance to to achieve max edema reduction, to enanble functional improvements,  including fitting preferred street shoes and standard sized clothing, and to achieve increased AROM and reduced falls risk.    Baseline  dependent 01/04/19: Pt unable to reach feet and distal legs to perform self MLD due to AROM limited by body habitus. Pt will benefit from Flexitouch sequential 01/11/19 : Pt reports ~85% compliance. pneumatic compression device ("pump") for long term LE self management.    Period  Weeks    Status  Partially Met      OT LONG TERM GOAL #5   Title  Patient will understand the importance of weight management, increased activity level, and leg elevation for optimal control of limb swelling over time.    Baseline  dependent    Time  12    Period  Weeks    Status  Achieved      OT LONG TERM GOAL #6   Title  During self-management phase of CDT Pt will retain volumetric reduction bilaterally over 6 month visit interval with no greater than 3% upward fluctuation to limit progression of chronic leg swelling and related skin changes, and to reduce infection risk.    Baseline  Max A    Time  6    Period  Months    Status  New    Target Date  07/20/19            Plan - 02/09/19 1129    Clinical Impression Statement  Cont compression to RLE today while continuing MLD and skin care to LLE as established while awaiting delivery and fitting of custom RLE compression garment. DME vendor reports by email that the garment has not yet been ordered as check from [facility] has not yet been received. Despite extending CDT to RLE. Pt continues to  toleratr manual therapy and compression wrapping without c/o high burden of care , or showing signs of skin fatigue. Leg swelling in LLE, which is untreated, contiues to fluctuate noticably   without compression, but Pt is not safe for transfers, walking and driving with both legs wrapped. Cont as per POC.    OT  Occupational Profile and History  Comprehensive Assessment- Review of records and extensive additional review of physical, cognitive, psychosocial history related to current functional performance    Occupational performance deficits (Please refer to evaluation for details):  ADL's;IADL's;Leisure;Social Participation;Rest and Sleep;Other;Work   Engineer, building services, productive activities, role performance   Body Structure / Function / Physical Skills  ADL;Obesity;Decreased knowledge of precautions;Balance;Body mechanics;Decreased knowledge of use of DME;Flexibility;IADL;Pain;Skin integrity;Strength;Edema;Gait;Mobility;ROM    Rehab Potential  Good    Clinical Decision Making  Multiple treatment options, significant modification of task necessary    Comorbidities Affecting Occupational Performance:  Presence of comorbidities impacting occupational performance    Comorbidities impacting occupational performance description:  see SUBJECTIVE    Modification or Assistance to Complete Evaluation   Max significant modification of tasks or assist is necessary to complete    OT Frequency  3x / week    OT Duration  12 weeks   consider reducing to 2 x weekly depending on level of and cinsistency of CG support   OT Treatment/Interventions  Self-care/ADL training;Therapeutic exercise;DME and/or AE instruction;Therapist, nutritional;Compression bandaging;Other (comment);Manual Therapy;Patient/family education;Therapeutic activities;Manual lymph drainage;Energy conservation    Plan  Intensive Phase CDT= Manual lymphatic drainage, (MLD) skin care to improve tissue health, therapeutic exercise (lymphatic pumping  ther ex) and gradient compression wrapping one leg at a time with short stretch compression bandages (knee length in Ms. Grave's case) . Pt edu for LE self care  is ongoing and intensive . Management Pgase CDT is on follow-up PRN basis to support management over time, replacing compression garments, etc.    Recommended Other Services  fit with BLE custom knee high compression stockings, toe caps and convoluted foam HOS devices to limit fibrosis formation ( progression) during HOS    Consulted and Agree with Plan of Care  Patient       Patient will benefit from skilled therapeutic intervention in order to improve the following deficits and impairments:   Body Structure / Function / Physical Skills: ADL, Obesity, Decreased knowledge of precautions, Balance, Body mechanics, Decreased knowledge of use of DME, Flexibility, IADL, Pain, Skin integrity, Strength, Edema, Gait, Mobility, ROM       Visit Diagnosis: Lymphedema, not elsewhere classified    Problem List There are no active problems to display for this patient.   Andrey Spearman, MS, OTR/L, [facility] 02/09/19 4:57 PM  [place] MAIN [facility] SERVICES [address] [place], [place], [address] Phone: [phone]   Fax:  [phone]  Name: ROSSELYN MARTHA MRN: [id number] Date of Birth: [birth year]

## 2019-02-11 ENCOUNTER — Ambulatory Visit: Payer: Medicare Other | Admitting: Occupational Therapy

## 2019-02-11 ENCOUNTER — Other Ambulatory Visit: Payer: Self-pay

## 2019-02-11 DIAGNOSIS — I890 Lymphedema, not elsewhere classified: Secondary | ICD-10-CM | POA: Diagnosis not present

## 2019-02-11 NOTE — Therapy (Signed)
[place] MAIN [facility] SERVICES [address] [place], [place], [address] Phone: [phone]   Fax:  [phone]  Occupational Therapy Treatment  Patient Details  Name: HALSEY PERSAUD MRN: [id number] Date of Birth: 1956-04-02 Referring Provider (OT): Delight Stare, MD   Encounter Date: 02/11/2019  OT End of Session - 02/11/19 1259    Visit Number  26    Number of Visits  36    Date for OT Re-Evaluation  03/02/19    OT Start Time  1000    OT Stop Time  1100    OT Time Calculation (min)  60 min    Activity Tolerance  Patient tolerated treatment well;No increased pain   limited by body habitus. Unable to reach feet and lower legs 2/2 girth   Behavior During Therapy  [facility] for tasks assessed/performed       Past Medical History:  Diagnosis Date  . Diabetes mellitus without complication ([place])   . Hypercholesteremia   . Hypertension     Past Surgical History:  Procedure Laterality Date  . BREAST EXCISIONAL BIOPSY Left 1970   negative  . COLONOSCOPY      There were no vitals filed for this visit.  Subjective Assessment - 02/11/19 1257    Subjective   [name] presents in transport wc for OT Rx day 26/ 36 to address BLE lymphedema . Pt presents with multilayer compression wrap in place below the knee. Pt expresses concern re swelling above her knees and worsening mobility along w/ LE progression.Pt reports she continues to have difficulty with car transfers due to body habitus and OA pain.    Pertinent History  Dx relevant to lymphedema (LE): HTN, DM, neuropathy, DJD, arthritis, sciatica,  chronic back pain, morbid obesity, (BMI 50-59.9)    Repetition  Increases Symptoms    Special Tests  strong + Stemmer sign at base of toes bilaterally    Pain Onset  Other (comment)   onset "years ago"                  OT Treatments/Exercises (OP) - 02/11/19 0001      ADLs   ADL Education Given  Yes      Manual Therapy   Manual Therapy   Edema management;Manual Lymphatic Drainage (MLD);Compression Bandaging    Edema Management  skin care as establlished    Manual Lymphatic Drainage (MLD)  MLD to RLE using short neck sequence , deep diaphragmatic breathing and functional inguinal pathways.    Compression Bandaging  RLE cmpression wraps as established             OT Education - 02/11/19 1258    Education Details  Continued skilled Pt/caregiver education  And LE ADL training throughout visit for lymphedema self care/ home program, including compression wrapping, compression garment and device wear/care, lymphatic pumping ther ex, simple self-MLD, and skin care. Discussed progress towards goals.    Person(s) Educated  Patient    Methods  Explanation;Demonstration;Tactile cues;Verbal cues;Handout    Comprehension  Verbalized understanding;Returned demonstration;Need further instruction          OT Long Term Goals - 01/21/19 0911      OT LONG TERM GOAL #1   Title  Pt will demonstrate understanding of lymphedema precautions and signs/ symptoms of cellulitisby identifying 6 items for each using printed Lymphedema Workbook for reference PRN (modified independence) to limit LE progression and infection risk.    Baseline  Max A    Time  4    Period  Days    Status  Achieved      OT LONG TERM GOAL #2   Title  Pt will require Max assist from caregiver who will be able to apply multilayer, gradient compression wraps using correct techniques after skilled training to reduce limb volume and limity LE progression.    Baseline  dependent    Time  4    Period  Days    Status  Achieved      OT LONG TERM GOAL #3   Title  Pt will achieve 10% limb volume reductions  bilaterally from ankle to tibial tuberosity during Intensive Phase CDT to return limbs to typical size and shape ,. reduce infection risk and improve basic andf instrumental ALDs performance.    Baseline  dependent  01/04/19: achieved 12/31/18 for RLE with 26.73% limb  volume reduction below the knee.    Time  12    Period  Weeks    Status  Partially Met      OT LONG TERM GOAL #4   Title  Pt will consistently perform all LE self -care home protocols ( simple self MLD, skin care, therapeutic exercise, compression therapies) daily  with max CG assistance to to achieve max edema reduction, to enanble functional improvements,  including fitting preferred street shoes and standard sized clothing, and to achieve increased AROM and reduced falls risk.    Baseline  dependent 01/04/19: Pt unable to reach feet and distal legs to perform self MLD due to AROM limited by body habitus. Pt will benefit from Flexitouch sequential 01/11/19 : Pt reports ~85% compliance. pneumatic compression device ("pump") for long term LE self management.    Period  Weeks    Status  Partially Met      OT LONG TERM GOAL #5   Title  Patient will understand the importance of weight management, increased activity level, and leg elevation for optimal control of limb swelling over time.    Baseline  dependent    Time  12    Period  Weeks    Status  Achieved      OT LONG TERM GOAL #6   Title  During self-management phase of CDT Pt will retain volumetric reduction bilaterally over 6 month visit interval with no greater than 3% upward fluctuation to limit progression of chronic leg swelling and related skin changes, and to reduce infection risk.    Baseline  Max A    Time  6    Period  Months    Status  New    Target Date  07/20/19            Plan - 02/11/19 1259    Clinical Impression Statement  Pt tolerated RLE MLD today. Swelling palpably and visibly  reduced today since last visit in RLE, but LLE appears more swollen than previously. Cont as per POC. Ongoing response to OT for CDT is slow, but steady.    OT Occupational Profile and History  Comprehensive Assessment- Review of records and extensive additional review of physical, cognitive, psychosocial history related to current  functional performance    Occupational performance deficits (Please refer to evaluation for details):  ADL's;IADL's;Leisure;Social Participation;Rest and Sleep;Other;Work   Engineer, building services, productive activities, role performance   Body Structure / Function / Physical Skills  ADL;Obesity;Decreased knowledge of precautions;Balance;Body mechanics;Decreased knowledge of use of DME;Flexibility;IADL;Pain;Skin integrity;Strength;Edema;Gait;Mobility;ROM    Rehab Potential  Good    Clinical Decision Making  Multiple treatment options,  significant modification of task necessary    Comorbidities Affecting Occupational Performance:  Presence of comorbidities impacting occupational performance    Comorbidities impacting occupational performance description:  see SUBJECTIVE    Modification or Assistance to Complete Evaluation   Max significant modification of tasks or assist is necessary to complete    OT Frequency  3x / week    OT Duration  12 weeks   consider reducing to 2 x weekly depending on level of and cinsistency of CG support   OT Treatment/Interventions  Self-care/ADL training;Therapeutic exercise;DME and/or AE instruction;Therapist, nutritional;Compression bandaging;Other (comment);Manual Therapy;Patient/family education;Therapeutic activities;Manual lymph drainage;Energy conservation    Plan  Intensive Phase CDT= Manual lymphatic drainage, (MLD) skin care to improve tissue health, therapeutic exercise (lymphatic pumping ther ex) and gradient compression wrapping one leg at a time with short stretch compression bandages (knee length in Ms. Grave's case) . Pt edu for LE self care  is ongoing and intensive . Management Pgase CDT is on follow-up PRN basis to support management over time, replacing compression garments, etc.    Recommended Other Services  fit with BLE custom knee high compression stockings, toe caps and convoluted foam HOS devices to limit fibrosis formation ( progression) during HOS     Consulted and Agree with Plan of Care  Patient       Patient will benefit from skilled therapeutic intervention in order to improve the following deficits and impairments:   Body Structure / Function / Physical Skills: ADL, Obesity, Decreased knowledge of precautions, Balance, Body mechanics, Decreased knowledge of use of DME, Flexibility, IADL, Pain, Skin integrity, Strength, Edema, Gait, Mobility, ROM       Visit Diagnosis: Lymphedema, not elsewhere classified    Problem List There are no active problems to display for this patient.   Andrey Spearman, MS, OTR/L, [facility] 02/11/19 1:02 PM  [place] MAIN [facility] SERVICES [address] [place], [place], [address] Phone: [phone]   Fax:  [phone]  Name: PECOLA HAXTON MRN: [id number] Date of Birth: December 23, 1955

## 2019-02-15 ENCOUNTER — Other Ambulatory Visit: Payer: Self-pay

## 2019-02-15 ENCOUNTER — Ambulatory Visit: Payer: Medicare Other | Admitting: Occupational Therapy

## 2019-02-15 DIAGNOSIS — I890 Lymphedema, not elsewhere classified: Secondary | ICD-10-CM

## 2019-02-15 NOTE — Therapy (Signed)
[place] MAIN [facility] SERVICES [address] [place], [place], [address] Phone: [phone]   Fax:  [phone]  Occupational Therapy Treatment  Patient Details  Name: Nancy Riley MRN: [id number] Date of Birth: 04-15-1956 Referring Provider (OT): Delight Stare, MD   Encounter Date: 02/15/2019  OT End of Session - 02/15/19 1139    Visit Number  27    Number of Visits  36    Date for OT Re-Evaluation  03/02/19    OT Start Time  1000    OT Stop Time  1110    OT Time Calculation (min)  70 min    Activity Tolerance  Patient tolerated treatment well;No increased pain   limited by body habitus. Unable to reach feet and lower legs 2/2 girth   Behavior During Therapy  [facility] for tasks assessed/performed       Past Medical History:  Diagnosis Date  . Diabetes mellitus without complication ([place])   . Hypercholesteremia   . Hypertension     Past Surgical History:  Procedure Laterality Date  . BREAST EXCISIONAL BIOPSY Left 1970   negative  . COLONOSCOPY      There were no vitals filed for this visit.  Subjective Assessment - 02/15/19 1010    Subjective   [name] presents in transport wc for OT Rx # 27/ 36 to address BLE lymphedema . Pt presents with multilayer compression wrap in place below the knee. Pt reports "usual knee and back pain. Pt rates knee pains at 10/10 "all weekend, and back pain at 5/10.    Pertinent History  Dx relevant to lymphedema (LE): HTN, DM, neuropathy, DJD, arthritis, sciatica,  chronic back pain, morbid obesity, (BMI 50-59.9)    Repetition  Increases Symptoms    Special Tests  strong + Stemmer sign at base of toes bilaterally    Pain Onset  Other (comment)   onset "years ago"                  OT Treatments/Exercises (OP) - 02/15/19 0001      ADLs   ADL Education Given  Yes      Manual Therapy   Manual Therapy  Edema management;Manual Lymphatic Drainage (MLD);Compression Bandaging    Edema Management   skin care as establlished    Manual Lymphatic Drainage (MLD)  MLD to RLE using short neck sequence , deep diaphragmatic breathing and functional inguinal pathways.    Compression Bandaging  RLE cmpression wraps as established             OT Education - 02/15/19 1139    Education Details  Continued skilled Pt/caregiver education  And LE ADL training throughout visit for lymphedema self care/ home program, including compression wrapping, compression garment and device wear/care, lymphatic pumping ther ex, simple self-MLD, and skin care. Discussed progress towards goals.    Person(s) Educated  Patient    Methods  Explanation;Demonstration;Tactile cues;Verbal cues;Handout    Comprehension  Verbalized understanding;Returned demonstration;Need further instruction          OT Long Term Goals - 01/21/19 0911      OT LONG TERM GOAL #1   Title  Pt will demonstrate understanding of lymphedema precautions and signs/ symptoms of cellulitisby identifying 6 items for each using printed Lymphedema Workbook for reference PRN (modified independence) to limit LE progression and infection risk.    Baseline  Max A    Time  4    Period  Days  Status  Achieved      OT LONG TERM GOAL #2   Title  Pt will require Max assist from caregiver who will be able to apply multilayer, gradient compression wraps using correct techniques after skilled training to reduce limb volume and limity LE progression.    Baseline  dependent    Time  4    Period  Days    Status  Achieved      OT LONG TERM GOAL #3   Title  Pt will achieve 10% limb volume reductions  bilaterally from ankle to tibial tuberosity during Intensive Phase CDT to return limbs to typical size and shape ,. reduce infection risk and improve basic andf instrumental ALDs performance.    Baseline  dependent  01/04/19: achieved 12/31/18 for RLE with 26.73% limb volume reduction below the knee.    Time  12    Period  Weeks    Status  Partially Met       OT LONG TERM GOAL #4   Title  Pt will consistently perform all LE self -care home protocols ( simple self MLD, skin care, therapeutic exercise, compression therapies) daily  with max CG assistance to to achieve max edema reduction, to enanble functional improvements,  including fitting preferred street shoes and standard sized clothing, and to achieve increased AROM and reduced falls risk.    Baseline  dependent 01/04/19: Pt unable to reach feet and distal legs to perform self MLD due to AROM limited by body habitus. Pt will benefit from Flexitouch sequential 01/11/19 : Pt reports ~85% compliance. pneumatic compression device ("pump") for long term LE self management.    Period  Weeks    Status  Partially Met      OT LONG TERM GOAL #5   Title  Patient will understand the importance of weight management, increased activity level, and leg elevation for optimal control of limb swelling over time.    Baseline  dependent    Time  12    Period  Weeks    Status  Achieved      OT LONG TERM GOAL #6   Title  During self-management phase of CDT Pt will retain volumetric reduction bilaterally over 6 month visit interval with no greater than 3% upward fluctuation to limit progression of chronic leg swelling and related skin changes, and to reduce infection risk.    Baseline  Max A    Time  6    Period  Months    Status  New    Target Date  07/20/19            Plan - 02/15/19 1140    Clinical Impression Statement  Dry skin below the knee on RLE continues to flake off throughout session. Palpable reduction in fibrosis density at R lateral and medial mallioli noted today after use of low profile comprex dot pads. Also nice reduction in dorsal foot swelling and tissue density at dorsal R foot with custom "cigarette foam" pad. Cont as per POC. As soon as RLE custom garments fitting is complete shift CDT to LLE.    OT Occupational Profile and History  Comprehensive Assessment- Review of records and  extensive additional review of physical, cognitive, psychosocial history related to current functional performance    Occupational performance deficits (Please refer to evaluation for details):  ADL's;IADL's;Leisure;Social Participation;Rest and Sleep;Other;Work   Engineer, building services, productive activities, role performance   Body Structure / Function / Physical Skills  ADL;Obesity;Decreased knowledge of precautions;Balance;Body mechanics;Decreased knowledge  of use of DME;Flexibility;IADL;Pain;Skin integrity;Strength;Edema;Gait;Mobility;ROM    Rehab Potential  Good    Clinical Decision Making  Multiple treatment options, significant modification of task necessary    Comorbidities Affecting Occupational Performance:  Presence of comorbidities impacting occupational performance    Comorbidities impacting occupational performance description:  see SUBJECTIVE    Modification or Assistance to Complete Evaluation   Max significant modification of tasks or assist is necessary to complete    OT Frequency  3x / week    OT Duration  12 weeks   consider reducing to 2 x weekly depending on level of and cinsistency of CG support   OT Treatment/Interventions  Self-care/ADL training;Therapeutic exercise;DME and/or AE instruction;Therapist, nutritional;Compression bandaging;Other (comment);Manual Therapy;Patient/family education;Therapeutic activities;Manual lymph drainage;Energy conservation    Plan  Intensive Phase CDT= Manual lymphatic drainage, (MLD) skin care to improve tissue health, therapeutic exercise (lymphatic pumping ther ex) and gradient compression wrapping one leg at a time with short stretch compression bandages (knee length in Ms. Grave's case) . Pt edu for LE self care  is ongoing and intensive . Management Pgase CDT is on follow-up PRN basis to support management over time, replacing compression garments, etc.    Recommended Other Services  fit with BLE custom knee high compression stockings, toe caps  and convoluted foam HOS devices to limit fibrosis formation ( progression) during HOS    Consulted and Agree with Plan of Care  Patient       Patient will benefit from skilled therapeutic intervention in order to improve the following deficits and impairments:   Body Structure / Function / Physical Skills: ADL, Obesity, Decreased knowledge of precautions, Balance, Body mechanics, Decreased knowledge of use of DME, Flexibility, IADL, Pain, Skin integrity, Strength, Edema, Gait, Mobility, ROM       Visit Diagnosis: Lymphedema, not elsewhere classified    Problem List There are no active problems to display for this patient.   Andrey Spearman, MS, OTR/L, [facility] 02/15/19 11:44 AM  [place] MAIN [facility] SERVICES [address] [place], [place], [address] Phone: [phone]   Fax:  [phone]  Name: SHABREKA COULON MRN: [id number] Date of Birth: May 03, 1956

## 2019-02-16 ENCOUNTER — Other Ambulatory Visit: Payer: Self-pay

## 2019-02-16 ENCOUNTER — Ambulatory Visit: Payer: Medicare Other | Admitting: Occupational Therapy

## 2019-02-16 DIAGNOSIS — I890 Lymphedema, not elsewhere classified: Secondary | ICD-10-CM

## 2019-02-16 NOTE — Therapy (Signed)
[place] MAIN [facility] SERVICES [address] [place], [place], [address] Phone: [phone]   Fax:  [phone]  Occupational Therapy Treatment  Patient Details  Name: Nancy Nancy Riley MRN: [id number] Date of Birth: 17-Feb-1956 Referring Provider (OT): Delight Stare, MD   Encounter Date: 02/16/2019  OT End of Session - 02/16/19 1007    Visit Number  28    Number of Visits  36    Date for OT Re-Evaluation  03/02/19    OT Start Time  1004    OT Stop Time  1104    OT Time Calculation (min)  60 min    Activity Tolerance  Patient tolerated treatment well;No increased pain   limited by body habitus. Unable to reach feet and lower legs 2/2 girth   Behavior During Therapy  [facility] for tasks assessed/performed       Past Medical History:  Diagnosis Date  . Diabetes mellitus without complication ([place])   . Hypercholesteremia   . Hypertension     Past Surgical History:  Procedure Laterality Date  . BREAST EXCISIONAL BIOPSY Left 1970   negative  . COLONOSCOPY      There were no vitals filed for this visit.  Subjective Assessment - 02/16/19 1007    Subjective   Nancy Riley presents in transport wc for OT Rx # 28/ 36 to address BLE lymphedema . Pt presents with multilayer compression wrap in place below the knee. Pt reports pain is unchanged since last visit, "a 5/10 in my back and a 10/10 in my knees".    Pertinent History  Dx relevant to lymphedema (LE): HTN, DM, neuropathy, DJD, arthritis, sciatica,  chronic back pain, morbid obesity, (BMI 50-59.9)    Repetition  Increases Symptoms    Special Tests  strong + Stemmer sign at base of toes bilaterally    Pain Onset  Other (comment)   onset "years ago"                          OT Education - 02/16/19 1009    Education Details  Continued skilled Pt/caregiver education  And LE ADL training throughout visit for lymphedema self care/ home program, including compression wrapping,  compression garment and device wear/care, lymphatic pumping ther ex, simple self-MLD, and skin care. Discussed progress towards goals.    Nancy [name] Educated  Patient    Methods  Explanation;Demonstration;Tactile cues;Verbal cues;Handout    Comprehension  Verbalized understanding;Returned demonstration;Need further instruction          OT Long Term Goals - 01/21/19 0911      OT LONG TERM GOAL #1   Title  Pt will demonstrate understanding of lymphedema precautions and signs/ symptoms of cellulitisby identifying 6 items for each using printed Lymphedema Workbook for reference PRN (modified independence) to limit LE progression and infection risk.    Baseline  Max A    Time  4    Period  Days    Status  Achieved      OT LONG TERM GOAL #2   Title  Pt will require Max assist from caregiver who will be able to apply multilayer, gradient compression wraps using correct techniques after skilled training to reduce limb volume and limity LE progression.    Baseline  dependent    Time  4    Period  Days    Status  Achieved      OT LONG TERM GOAL #3   Title  Pt will achieve 10% limb volume reductions  bilaterally from ankle to tibial tuberosity during Intensive Phase CDT to return limbs to typical size and shape ,. reduce infection risk and improve basic andf instrumental ALDs performance.    Baseline  dependent  01/04/19: achieved 12/31/18 for RLE with 26.73% limb volume reduction below the knee.    Time  12    Period  Weeks    Status  Partially Met      OT LONG TERM GOAL #4   Title  Pt will consistently perform all LE self -care home protocols ( simple self MLD, skin care, therapeutic exercise, compression therapies) daily  with max CG assistance to to achieve max edema reduction, to enanble functional improvements,  including fitting preferred street shoes and standard sized clothing, and to achieve increased AROM and reduced falls risk.    Baseline  dependent 01/04/19: Pt unable to reach feet  and distal legs to perform self MLD due to AROM limited by body habitus. Pt will benefit from Flexitouch sequential 01/11/19 : Pt reports ~85% compliance. pneumatic compression device ("pump") for long term LE self management.    Period  Weeks    Status  Partially Met      OT LONG TERM GOAL #5   Title  Patient will understand the importance of weight management, increased activity level, and leg elevation for optimal control of limb swelling over time.    Baseline  dependent    Time  12    Period  Weeks    Status  Achieved      OT LONG TERM GOAL #6   Title  During self-management phase of CDT Pt will retain volumetric reduction bilaterally over 6 month visit interval with no greater than 3% upward fluctuation to limit progression of chronic leg swelling and related skin changes, and to reduce infection risk.    Baseline  Max A    Time  6    Period  Months    Status  New    Target Date  07/20/19            Plan - 02/16/19 1220    Clinical Impression Statement  Pt edu ongoing throughout manual therapy session reguarding pros and challenges for treating medial thigh lobules. Despite expressing concern re progressive thigh lobules and knee  swelling  severely limiting functional ambulation, Pt is unwilling, at present, to wear skirt,  or loose fitting LB clothing necessary to apply thigh length gradient wraps. Pt understands that LE management requires ongoing compression to be successful. Pt states she will "think it over". Convoluted pads at medial and lateral R malleoli are reducing fibrosis in these areas. Dorsal foot pad continues to assist / controlling foot swelling. Pt continues to ttolerate all aspects of CDT. Custom garments are on order. Cont as per POC.    OT Occupational Profile and History  Comprehensive Assessment- Review of records and extensive additional review of physical, cognitive, psychosocial history related to current functional performance    Occupational performance  deficits (Please refer to evaluation for details):  ADL's;IADL's;Leisure;Social Participation;Rest and Sleep;Other;Work   Engineer, building services, productive activities, role performance   Body Structure / Function / Physical Skills  ADL;Obesity;Decreased knowledge of precautions;Balance;Body mechanics;Decreased knowledge of use of DME;Flexibility;IADL;Pain;Skin integrity;Strength;Edema;Gait;Mobility;ROM    Rehab Potential  Good    Clinical Decision Making  Multiple treatment options, significant modification of task necessary    Comorbidities Affecting Occupational Performance:  Presence of comorbidities impacting occupational performance  Comorbidities impacting occupational performance description:  see SUBJECTIVE    Modification or Assistance to Complete Evaluation   Max significant modification of tasks or assist is necessary to complete    OT Frequency  3x / week    OT Duration  12 weeks   consider reducing to 2 x weekly depending on level of and cinsistency of CG support   OT Treatment/Interventions  Self-care/ADL training;Therapeutic exercise;DME and/or AE instruction;Therapist, nutritional;Compression bandaging;Other (comment);Manual Therapy;Patient/family education;Therapeutic activities;Manual lymph drainage;Energy conservation    Plan  Intensive Phase CDT= Manual lymphatic drainage, (MLD) skin care to improve tissue health, therapeutic exercise (lymphatic pumping ther ex) and gradient compression wrapping one leg at a time with short stretch compression bandages (knee length in Ms. Grave's case) . Pt edu for LE self care  is ongoing and intensive . Management Pgase CDT is on follow-up PRN basis to support management over time, replacing compression garments, etc.    Recommended Other Services  fit with BLE custom knee high compression stockings, toe caps and convoluted foam HOS devices to limit fibrosis formation ( progression) during HOS    Consulted and Agree with Plan of Care  Patient        Patient will benefit from skilled therapeutic intervention in order to improve the following deficits and impairments:   Body Structure / Function / Physical Skills: ADL, Obesity, Decreased knowledge of precautions, Balance, Body mechanics, Decreased knowledge of use of DME, Flexibility, IADL, Pain, Skin integrity, Strength, Edema, Gait, Mobility, ROM       Visit Diagnosis: Lymphedema, not elsewhere classified    Problem List There are no active problems to display for this patient.  Andrey Spearman, MS, OTR/L, [facility] 02/16/19 1:10 PM   [place] MAIN [facility] SERVICES [address] [place], [place], [address] Phone: [phone]   Fax:  [phone]  Name: Nancy Nancy Riley MRN: [id number] Date of Birth: [birth year]

## 2019-02-18 ENCOUNTER — Ambulatory Visit: Payer: Medicare Other | Admitting: Occupational Therapy

## 2019-02-18 ENCOUNTER — Other Ambulatory Visit: Payer: Self-pay

## 2019-02-18 DIAGNOSIS — I890 Lymphedema, not elsewhere classified: Secondary | ICD-10-CM

## 2019-02-18 NOTE — Therapy (Signed)
[place] MAIN [facility] SERVICES [address] [place], [place], [address] Phone: [phone]   Fax:  [phone]  Occupational Therapy Treatment  Patient Details  Name: Nancy Riley MRN: [id number] Date of Birth: January 28, 1956 Referring Provider (OT): Delight Stare, MD   Encounter Date: 02/18/2019  OT End of Session - 02/18/19 1532    Visit Number  29    Number of Visits  36    Date for OT Re-Evaluation  03/02/19    OT Start Time  1005    OT Stop Time  1105    OT Time Calculation (min)  60 min    Activity Tolerance  Patient tolerated treatment well;No increased pain   limited by body habitus. Unable to reach feet and lower legs 2/2 girth   Behavior During Therapy  [facility] for tasks assessed/performed       Past Medical History:  Diagnosis Date  . Diabetes mellitus without complication ([place])   . Hypercholesteremia   . Hypertension     Past Surgical History:  Procedure Laterality Date  . BREAST EXCISIONAL BIOPSY Left 1970   negative  . COLONOSCOPY      There were no vitals filed for this visit.  Subjective Assessment - 02/18/19 1528    Subjective   [name] presents in transport wc for OT Rx # 27/ 36 to address BLE lymphedema . Pt presents with multilayer compression wrap in place below the knee. DME rep, John, from [employer] here to assist Pt with edu re sequential pneumatic compression device to help with LE self management over time.    Pertinent History  Dx relevant to lymphedema (LE): HTN, DM, neuropathy, DJD, arthritis, sciatica,  chronic back pain, morbid obesity, (BMI 50-59.9)    Repetition  Increases Symptoms    Special Tests  strong + Stemmer sign at base of toes bilaterally    Pain Onset  Other (comment)   onset "years ago"                  OT Treatments/Exercises (OP) - 02/18/19 0001      ADLs   ADL Education Given  Yes      Manual Therapy   Manual Therapy  Edema management;Manual Lymphatic Drainage  (MLD);Compression Bandaging    Edema Management  skin care as establlished    Manual Lymphatic Drainage (MLD)  MLD to RLE using short neck sequence , deep diaphragmatic breathing and functional inguinal pathways.    Compression Bandaging  RLE cmpression wraps as established             OT Education - 02/18/19 1531    Education Details  Pt edu for use of technology assisted LEself management over time using sequential pneumatic compression device - Lymphapress ("pump")    Person(s) Educated  Patient    Methods  Explanation;Demonstration;Tactile cues;Verbal cues;Handout    Comprehension  Verbalized understanding;Returned demonstration;Need further instruction;Verbal cues required;Tactile cues required          OT Long Term Goals - 01/21/19 0911      OT LONG TERM GOAL #1   Title  Pt will demonstrate understanding of lymphedema precautions and signs/ symptoms of cellulitisby identifying 6 items for each using printed Lymphedema Workbook for reference PRN (modified independence) to limit LE progression and infection risk.    Baseline  Max A    Time  4    Period  Days    Status  Achieved      OT LONG TERM  GOAL #2   Title  Pt will require Max assist from caregiver who will be able to apply multilayer, gradient compression wraps using correct techniques after skilled training to reduce limb volume and limity LE progression.    Baseline  dependent    Time  4    Period  Days    Status  Achieved      OT LONG TERM GOAL #3   Title  Pt will achieve 10% limb volume reductions  bilaterally from ankle to tibial tuberosity during Intensive Phase CDT to return limbs to typical size and shape ,. reduce infection risk and improve basic andf instrumental ALDs performance.    Baseline  dependent  01/04/19: achieved 12/31/18 for RLE with 26.73% limb volume reduction below the knee.    Time  12    Period  Weeks    Status  Partially Met      OT LONG TERM GOAL #4   Title  Pt will consistently  perform all LE self -care home protocols ( simple self MLD, skin care, therapeutic exercise, compression therapies) daily  with max CG assistance to to achieve max edema reduction, to enanble functional improvements,  including fitting preferred street shoes and standard sized clothing, and to achieve increased AROM and reduced falls risk.    Baseline  dependent 01/04/19: Pt unable to reach feet and distal legs to perform self MLD due to AROM limited by body habitus. Pt will benefit from Flexitouch sequential 01/11/19 : Pt reports ~85% compliance. pneumatic compression device ("pump") for long term LE self management.    Period  Weeks    Status  Partially Met      OT LONG TERM GOAL #5   Title  Patient will understand the importance of weight management, increased activity level, and leg elevation for optimal control of limb swelling over time.    Baseline  dependent    Time  12    Period  Weeks    Status  Achieved      OT LONG TERM GOAL #6   Title  During self-management phase of CDT Pt will retain volumetric reduction bilaterally over 6 month visit interval with no greater than 3% upward fluctuation to limit progression of chronic leg swelling and related skin changes, and to reduce infection risk.    Baseline  Max A    Time  6    Period  Months    Status  New    Target Date  07/20/19            Plan - 02/18/19 1533    Clinical Impression Statement  Pt edu ongoing throughout manual therapy session reguarding indications and contraindications of using sequential pneumatic  compression device , ("lymphedema pump") to assist with LE self management over time after skilled clinical therapy is completed. Pt understands that recommended , 32 chamber device working from distal to proximal , known as an " advanced  device" is typically not covered by Medicare. Pt willing to do trila in clinic with 8 chambered , "basic device" in clinic as alternative funded device to help w/ LE management. Finally,  this device  will assist Pt with management of severe thigh swelling which hinders her ability to ambulate and perform safe transfers. To date she has not tolerated compression wraps above the knee well. She is reluctant to wrap  proximal to the knee for fear of falling. Cont as per OC. Trial with Lymphapress scheduled for next Monday.    OT   Occupational Profile and History  Comprehensive Assessment- Review of records and extensive additional review of physical, cognitive, psychosocial history related to current functional performance    Occupational performance deficits (Please refer to evaluation for details):  ADL's;IADL's;Leisure;Social Participation;Rest and Sleep;Other;Work   Engineer, building services, productive activities, role performance   Body Structure / Function / Physical Skills  ADL;Obesity;Decreased knowledge of precautions;Balance;Body mechanics;Decreased knowledge of use of DME;Flexibility;IADL;Pain;Skin integrity;Strength;Edema;Gait;Mobility;ROM    Rehab Potential  Good    Clinical Decision Making  Multiple treatment options, significant modification of task necessary    Comorbidities Affecting Occupational Performance:  Presence of comorbidities impacting occupational performance    Comorbidities impacting occupational performance description:  see SUBJECTIVE    Modification or Assistance to Complete Evaluation   Max significant modification of tasks or assist is necessary to complete    OT Frequency  3x / week    OT Duration  12 weeks   consider reducing to 2 x weekly depending on level of and cinsistency of CG support   OT Treatment/Interventions  Self-care/ADL training;Therapeutic exercise;DME and/or AE instruction;Therapist, nutritional;Compression bandaging;Other (comment);Manual Therapy;Patient/family education;Therapeutic activities;Manual lymph drainage;Energy conservation    Plan  Intensive Phase CDT= Manual lymphatic drainage, (MLD) skin care to improve tissue health, therapeutic  exercise (lymphatic pumping ther ex) and gradient compression wrapping one leg at a time with short stretch compression bandages (knee length in Ms. Grave's case) . Pt edu for LE self care  is ongoing and intensive . Management Pgase CDT is on follow-up PRN basis to support management over time, replacing compression garments, etc.    Recommended Other Services  fit with BLE custom knee high compression stockings, toe caps and convoluted foam HOS devices to limit fibrosis formation ( progression) during HOS    Consulted and Agree with Plan of Care  Patient       Patient will benefit from skilled therapeutic intervention in order to improve the following deficits and impairments:   Body Structure / Function / Physical Skills: ADL, Obesity, Decreased knowledge of precautions, Balance, Body mechanics, Decreased knowledge of use of DME, Flexibility, IADL, Pain, Skin integrity, Strength, Edema, Gait, Mobility, ROM       Visit Diagnosis: Lymphedema, not elsewhere classified    Problem List There are no active problems to display for this patient.  Andrey Spearman, MS, OTR/L, [facility] 02/18/19 3:40 PM   [place] MAIN [facility] SERVICES [address] [place], [place], [address] Phone: [phone]   Fax:  [phone]  Name: Nancy Riley MRN: [id number] Date of Birth: 01-15-1956

## 2019-02-22 ENCOUNTER — Ambulatory Visit: Payer: Medicare Other | Admitting: Occupational Therapy

## 2019-02-22 ENCOUNTER — Other Ambulatory Visit: Payer: Self-pay

## 2019-02-22 DIAGNOSIS — I890 Lymphedema, not elsewhere classified: Secondary | ICD-10-CM

## 2019-02-23 ENCOUNTER — Ambulatory Visit: Payer: Medicare Other | Admitting: Occupational Therapy

## 2019-02-23 ENCOUNTER — Other Ambulatory Visit: Payer: Self-pay

## 2019-02-23 DIAGNOSIS — I890 Lymphedema, not elsewhere classified: Secondary | ICD-10-CM

## 2019-02-23 NOTE — Therapy (Signed)
[place] MAIN [facility] SERVICES [address] [place], [place], [address] Phone: [phone]   Fax:  [phone]  Occupational Therapy Treatment Note and  Progress Report  Patient Details  Name: Nancy Riley MRN: [id number] Date of Birth: June 17, 1955 Referring Provider (OT): Delight Stare, MD   Encounter Date: 02/22/2019  OT End of Session - 02/22/19 1215    Visit Number  30    Number of Visits  36    Date for OT Re-Evaluation  03/02/19    OT Start Time  1006    OT Stop Time  1106    OT Time Calculation (min)  60 min    Activity Tolerance  Patient tolerated treatment well;No increased pain   limited by body habitus. Unable to reach feet and lower legs 2/2 girth   Behavior During Therapy  [facility] for tasks assessed/performed       Past Medical History:  Diagnosis Date  . Diabetes mellitus without complication ([place])   . Hypercholesteremia   . Hypertension     Past Surgical History:  Procedure Laterality Date  . BREAST EXCISIONAL BIOPSY Left 1970   negative  . COLONOSCOPY      There were no vitals filed for this visit.  Subjective Assessment - 02/22/19 1213    Subjective   [name] presents in transport wc for OT Rx # 30/ 36 to address BLE lymphedema    Pertinent History  Dx relevant to lymphedema (LE): HTN, DM, neuropathy, DJD, arthritis, sciatica,  chronic back pain, morbid obesity, (BMI 50-59.9)    Repetition  Increases Symptoms    Special Tests  strong + Stemmer sign at base of toes bilaterally    Pain Onset  Other (comment)   onset "years ago"                          OT Education - 02/22/19 1217    Education Details  Continued skilled Pt/caregiver education  And LE ADL training throughout visit for lymphedema self care/ home program, including compression wrapping, compression garment and device wear/care, lymphatic pumping ther ex, simple self-MLD, and skin care. Discussed progress towards goals.    Person(s)  Educated  Patient    Methods  Explanation;Demonstration;Tactile cues;Verbal cues;Handout    Comprehension  Verbalized understanding;Returned demonstration;Need further instruction;Verbal cues required;Tactile cues required          OT Long Term Goals - 02/23/19 1200      OT LONG TERM GOAL #1   Title  Pt will demonstrate understanding of lymphedema precautions and signs/ symptoms of cellulitisby identifying 6 items for each using printed Lymphedema Workbook for reference PRN (modified independence) to limit LE progression and infection risk.    Baseline  Max A    Time  4    Period  Days    Status  Achieved      OT LONG TERM GOAL #2   Title  Pt will require Max assist from caregiver who will be able to apply multilayer, gradient compression wraps using correct techniques after skilled training to reduce limb volume and limity LE progression.    Baseline  dependent    Time  4    Period  Days    Status  Achieved      OT LONG TERM GOAL #3   Title  Pt will achieve 10% limb volume reductions  bilaterally from ankle to tibial tuberosity during Intensive Phase CDT to return limbs to  typical size and shape ,. reduce infection risk and improve basic andf instrumental ALDs performance.    Baseline  dependent  01/04/19: achieved 12/31/18 for RLE with 26.73% limb volume reduction below the knee.    Time  12    Period  Weeks    Status  Partially Met      OT LONG TERM GOAL #4   Title  Pt will consistently perform all LE self -care home protocols ( simple self MLD, skin care, therapeutic exercise, compression therapies) daily  with max CG assistance to to achieve max edema reduction, to enanble functional improvements,  including fitting preferred street shoes and standard sized clothing, and to achieve increased AROM and reduced falls risk.    Baseline  dependent 01/04/19: Pt unable to reach feet and distal legs to perform self MLD due to AROM limited by body habitus. Pt will benefit from Flexitouch  sequential 01/11/19 : Pt reports ~85% compliance. pneumatic compression device ("pump") for long term LE self management.    Period  Weeks    Status  Partially Met      OT LONG TERM GOAL #5   Title  Patient will understand the importance of weight management, increased activity level, and leg elevation for optimal control of limb swelling over time.    Baseline  dependent    Time  12    Period  Weeks    Status  Achieved      OT LONG TERM GOAL #6   Title  During self-management phase of CDT Pt will retain volumetric reduction bilaterally over 6 month visit interval with no greater than 3% upward fluctuation to limit progression of chronic leg swelling and related skin changes, and to reduce infection risk.    Baseline  Max A    Time  6    Period  Months    Status  New            Plan - 02/23/19 1219    Clinical Impression Statement  Reviewed Pt progress towards all goals. Provided MLD, skin care and compression therapy to RLE without difficulty toerating any aspect of therapy, Provided online resources for local low interest loan for home modifications        as Pt having severe difficulty navigating steps in and out of her home   due to leg pain and swelling. Pt to research on her own. No limb volumetrics today as swelling is not as well managed as usual; after weekend interval between sessions. will measure tomorrow to measure progress towards limb volume reduction goal for RL:E. Cont as per POC, 2 x weekly while awaiting custom RLE compression garments and pump trial scheduled for next Monday.    OT Occupational Profile and History  Comprehensive Assessment- Review of records and extensive additional review of physical, cognitive, psychosocial history related to current functional performance    Occupational performance deficits (Please refer to evaluation for details):  ADL's;IADL's;Leisure;Social Participation;Rest and Sleep;Other;Work   Engineer, building services, productive activities, role  performance   Body Structure / Function / Physical Skills  ADL;Obesity;Decreased knowledge of precautions;Balance;Body mechanics;Decreased knowledge of use of DME;Flexibility;IADL;Pain;Skin integrity;Strength;Edema;Gait;Mobility;ROM    Rehab Potential  Good    Clinical Decision Making  Multiple treatment options, significant modification of task necessary    Comorbidities Affecting Occupational Performance:  Presence of comorbidities impacting occupational performance    Comorbidities impacting occupational performance description:  see SUBJECTIVE    Modification or Assistance to Complete Evaluation   Max significant modification of  tasks or assist is necessary to complete    OT Frequency  3x / week    OT Duration  12 weeks   consider reducing to 2 x weekly depending on level of and cinsistency of CG support   OT Treatment/Interventions  Self-care/ADL training;Therapeutic exercise;DME and/or AE instruction;Therapist, nutritional;Compression bandaging;Other (comment);Manual Therapy;Patient/family education;Therapeutic activities;Manual lymph drainage;Energy conservation    Plan  Intensive Phase CDT= Manual lymphatic drainage, (MLD) skin care to improve tissue health, therapeutic exercise (lymphatic pumping ther ex) and gradient compression wrapping one leg at a time with short stretch compression bandages (knee length in Ms. Grave's case) . Pt edu for LE self care  is ongoing and intensive . Management Pgase CDT is on follow-up PRN basis to support management over time, replacing compression garments, etc.    Recommended Other Services  fit with BLE custom knee high compression stockings, toe caps and convoluted foam HOS devices to limit fibrosis formation ( progression) during HOS    Consulted and Agree with Plan of Care  Patient       Patient will benefit from skilled therapeutic intervention in order to improve the following deficits and impairments:   Body Structure / Function / Physical  Skills: ADL, Obesity, Decreased knowledge of precautions, Balance, Body mechanics, Decreased knowledge of use of DME, Flexibility, IADL, Pain, Skin integrity, Strength, Edema, Gait, Mobility, ROM       Visit Diagnosis: Lymphedema, not elsewhere classified    Problem List There are no active problems to display for this patient.   Andrey Spearman, MS, OTR/L, [facility] 02/23/19 12:25 PM  [place] MAIN [facility] SERVICES [address] [place], [place], [address] Phone: [phone]   Fax:  [phone]  Name: Nancy Riley MRN: [id number] Date of Birth: 09/16/1955

## 2019-02-23 NOTE — Therapy (Signed)
[place] MAIN [facility] SERVICES [address] [place], [place], [address] Phone: [phone]   Fax:  [phone]  Occupational Therapy Treatment Note and Limb Volume Reduction Progress Report for RLE  Patient Details  Name: Nancy Riley MRN: [id number] Date of Birth: April 15, 1956 Referring Provider (OT): Delight Stare, MD   Encounter Date: 02/23/2019  OT End of Session - 02/23/19 1300    Visit Number  31    Number of Visits  36    Date for OT Re-Evaluation  03/02/19    OT Start Time  1000    OT Stop Time  1100    OT Time Calculation (min)  60 min    Activity Tolerance  Patient tolerated treatment well;No increased pain   limited by body habitus. Unable to reach feet and lower legs 2/2 girth   Behavior During Therapy  [facility] for tasks assessed/performed       Past Medical History:  Diagnosis Date  . Diabetes mellitus without complication ([place])   . Hypercholesteremia   . Hypertension     Past Surgical History:  Procedure Laterality Date  . BREAST EXCISIONAL BIOPSY Left 1970   negative  . COLONOSCOPY      There were no vitals filed for this visit.  Subjective Assessment - 02/23/19 1258    Subjective   [name] presents in transport wc for OT Rx # 2/ 36 to address BLE lymphedema . Pt presents with multilayer compression wrap in place below the knee. Pt has no new complaints today. Pain level is unchanged in back and B knees.    Pertinent History  Dx relevant to lymphedema (LE): HTN, DM, neuropathy, DJD, arthritis, sciatica,  chronic back pain, morbid obesity, (BMI 50-59.9)    Repetition  Increases Symptoms    Special Tests  strong + Stemmer sign at base of toes bilaterally    Pain Onset  Other (comment)   onset "years ago"                  OT Treatments/Exercises (OP) - 02/23/19 1259      Manual Therapy   Manual Therapy  Edema management;Manual Lymphatic Drainage (MLD);Compression Bandaging    Edema Management  skin care  as establlished    Manual Lymphatic Drainage (MLD)  MLD to RLE using short neck sequence , deep diaphragmatic breathing and functional inguinal pathways.    Compression Bandaging  RLE cmpression wraps as established             OT Education - 02/23/19 1300    Education Details  Continued skilled Pt/caregiver education  And LE ADL training throughout visit for lymphedema self care/ home program, including compression wrapping, compression garment and device wear/care, lymphatic pumping ther ex, simple self-MLD, and skin care. Discussed progress towards goals.    Person(s) Educated  Patient    Methods  Explanation;Demonstration;Tactile cues;Verbal cues;Handout    Comprehension  Verbalized understanding;Returned demonstration;Need further instruction;Verbal cues required;Tactile cues required          OT Long Term Goals - 02/23/19 1200      OT LONG TERM GOAL #1   Title  Pt will demonstrate understanding of lymphedema precautions and signs/ symptoms of cellulitisby identifying 6 items for each using printed Lymphedema Workbook for reference PRN (modified independence) to limit LE progression and infection risk.    Baseline  Max A    Time  4    Period  Days    Status  Achieved  OT LONG TERM GOAL #2   Title  Pt will require Max assist from caregiver who will be able to apply multilayer, gradient compression wraps using correct techniques after skilled training to reduce limb volume and limity LE progression.    Baseline  dependent    Time  4    Period  Days    Status  Achieved      OT LONG TERM GOAL #3   Title  Pt will achieve 10% limb volume reductions  bilaterally from ankle to tibial tuberosity during Intensive Phase CDT to return limbs to typical size and shape ,. reduce infection risk and improve basic andf instrumental ALDs performance.    Baseline  dependent  01/04/19: achieved 12/31/18 for RLE with 26.73% limb volume reduction below the knee.    Time  12    Period  Weeks     Status  Partially Met      OT LONG TERM GOAL #4   Title  Pt will consistently perform all LE self -care home protocols ( simple self MLD, skin care, therapeutic exercise, compression therapies) daily  with max CG assistance to to achieve max edema reduction, to enanble functional improvements,  including fitting preferred street shoes and standard sized clothing, and to achieve increased AROM and reduced falls risk.    Baseline  dependent 01/04/19: Pt unable to reach feet and distal legs to perform self MLD due to AROM limited by body habitus. Pt will benefit from Flexitouch sequential 01/11/19 : Pt reports ~85% compliance. pneumatic compression device ("pump") for long term LE self management.    Period  Weeks    Status  Partially Met      OT LONG TERM GOAL #5   Title  Patient will understand the importance of weight management, increased activity level, and leg elevation for optimal control of limb swelling over time.    Baseline  dependent    Time  12    Period  Weeks    Status  Achieved      OT LONG TERM GOAL #6   Title  During self-management phase of CDT Pt will retain volumetric reduction bilaterally over 6 month visit interval with no greater than 3% upward fluctuation to limit progression of chronic leg swelling and related skin changes, and to reduce infection risk.    Baseline  Max A    Time  6    Period  Months    Status  New            Plan - 02/23/19 1302    Clinical Impression Statement  Completed RLE comnparative limb volumetrics.RLE ( Rx limb) is decreased an additional 6.22% since last measured and a dramatic 31.29% below the knee over all since commencing OT. This value dramatically meets and exceeds ther Patient's initial 10% limb volume reduction goal for the RLE below the knee. Pt tolerated MLD and compression wraps application today. Urged Pt to schedule appointment to have nails cut ASAP to reduce infection risk. Cont as per POC. Pt's custom compression garments  should be available for fitting on RLE any day.    OT Occupational Profile and History  Comprehensive Assessment- Review of records and extensive additional review of physical, cognitive, psychosocial history related to current functional performance    Occupational performance deficits (Please refer to evaluation for details):  ADL's;IADL's;Leisure;Social Participation;Rest and Sleep;Other;Work   Engineer, building services, productive activities, role performance   Body Structure / Function / Physical Skills  ADL;Obesity;Decreased knowledge of precautions;Balance;Body  mechanics;Decreased knowledge of use of DME;Flexibility;IADL;Pain;Skin integrity;Strength;Edema;Gait;Mobility;ROM    Rehab Potential  Good    Clinical Decision Making  Multiple treatment options, significant modification of task necessary    Comorbidities Affecting Occupational Performance:  Presence of comorbidities impacting occupational performance    Comorbidities impacting occupational performance description:  see SUBJECTIVE    Modification or Assistance to Complete Evaluation   Max significant modification of tasks or assist is necessary to complete    OT Frequency  3x / week    OT Duration  12 weeks   consider reducing to 2 x weekly depending on level of and cinsistency of CG support   OT Treatment/Interventions  Self-care/ADL training;Therapeutic exercise;DME and/or AE instruction;Therapist, nutritional;Compression bandaging;Other (comment);Manual Therapy;Patient/family education;Therapeutic activities;Manual lymph drainage;Energy conservation    Plan  Intensive Phase CDT= Manual lymphatic drainage, (MLD) skin care to improve tissue health, therapeutic exercise (lymphatic pumping ther ex) and gradient compression wrapping one leg at a time with short stretch compression bandages (knee length in Ms. Grave's case) . Pt edu for LE self care  is ongoing and intensive . Management Pgase CDT is on follow-up PRN basis to support management over  time, replacing compression garments, etc.    Recommended Other Services  fit with BLE custom knee high compression stockings, toe caps and convoluted foam HOS devices to limit fibrosis formation ( progression) during HOS    Consulted and Agree with Plan of Care  Patient       Patient will benefit from skilled therapeutic intervention in order to improve the following deficits and impairments:   Body Structure / Function / Physical Skills: ADL, Obesity, Decreased knowledge of precautions, Balance, Body mechanics, Decreased knowledge of use of DME, Flexibility, IADL, Pain, Skin integrity, Strength, Edema, Gait, Mobility, ROM       Visit Diagnosis: Lymphedema, not elsewhere classified    Problem List There are no active problems to display for this patient.   Andrey Spearman, MS, OTR/L, [facility] 02/23/19 5:08 PM  [place] MAIN [facility] SERVICES [address] [place], [place], [address] Phone: [phone]   Fax:  [phone]  Name: INESHA SOW MRN: [id number] Date of Birth: 09/30/1955

## 2019-02-25 ENCOUNTER — Ambulatory Visit: Payer: Medicare Other | Admitting: Occupational Therapy

## 2019-02-25 ENCOUNTER — Other Ambulatory Visit: Payer: Self-pay

## 2019-02-25 DIAGNOSIS — I890 Lymphedema, not elsewhere classified: Secondary | ICD-10-CM

## 2019-02-25 NOTE — Therapy (Signed)
[place] MAIN [facility] SERVICES [address] [place], [place], [address] Phone: [phone]   Fax:  [phone]  Occupational Therapy Treatment  Patient Details  Name: Nancy Riley MRN: [id number] Date of Birth: 1956-03-22 Referring Provider (OT): Delight Stare, MD   Encounter Date: 02/25/2019  OT End of Session - 02/25/19 0958    Visit Number  32    Number of Visits  36    Date for OT Re-Evaluation  03/02/19    OT Start Time  0953    OT Stop Time  1102    OT Time Calculation (min)  69 min    Activity Tolerance  Patient tolerated treatment well;No increased pain   limited by body habitus. Unable to reach feet and lower legs 2/2 girth   Behavior During Therapy  [facility] for tasks assessed/performed       Past Medical History:  Diagnosis Date  . Diabetes mellitus without complication ([place])   . Hypercholesteremia   . Hypertension     Past Surgical History:  Procedure Laterality Date  . BREAST EXCISIONAL BIOPSY Left 1970   negative  . COLONOSCOPY      There were no vitals filed for this visit.  Subjective Assessment - 02/25/19 0956    Subjective   [name] presents in transport wc for OT Rx # 32/ 36 to address BLE lymphedema . Pt presents with multilayer compression wrap in place below the knee. Pt has no new complaints today. Pain level in back and knees remains 5/10.    Pertinent History  Dx relevant to lymphedema (LE): HTN, DM, neuropathy, DJD, arthritis, sciatica,  chronic back pain, morbid obesity, (BMI 50-59.9)    Repetition  Increases Symptoms    Special Tests  strong + Stemmer sign at base of toes bilaterally    Pain Onset  Other (comment)   onset "years ago"                  OT Treatments/Exercises (OP) - 02/25/19 0001      ADLs   ADL Education Given  Yes      Manual Therapy   Manual Therapy  Edema management;Manual Lymphatic Drainage (MLD);Compression Bandaging    Edema Management  skin care as establlished     Manual Lymphatic Drainage (MLD)  MLD to RLE using short neck sequence , deep diaphragmatic breathing and functional inguinal pathways.    Compression Bandaging  RLE cmpression wraps as established             OT Education - 02/25/19 0957    Education Details  Continued skilled Pt/caregiver education  And LE ADL training throughout visit for lymphedema self care/ home program, including compression wrapping, compression garment and device wear/care, lymphatic pumping ther ex, simple self-MLD, and skin care. Discussed progress towards goals.    Person(s) Educated  Patient    Methods  Explanation;Demonstration;Tactile cues;Verbal cues;Handout    Comprehension  Verbalized understanding;Returned demonstration;Need further instruction;Verbal cues required;Tactile cues required          OT Long Term Goals - 02/23/19 1200      OT LONG TERM GOAL #1   Title  Pt will demonstrate understanding of lymphedema precautions and signs/ symptoms of cellulitisby identifying 6 items for each using printed Lymphedema Workbook for reference PRN (modified independence) to limit LE progression and infection risk.    Baseline  Max A    Time  4    Period  Days    Status  Achieved      OT LONG TERM GOAL #2   Title  Pt will require Max assist from caregiver who will be able to apply multilayer, gradient compression wraps using correct techniques after skilled training to reduce limb volume and limity LE progression.    Baseline  dependent    Time  4    Period  Days    Status  Achieved      OT LONG TERM GOAL #3   Title  Pt will achieve 10% limb volume reductions  bilaterally from ankle to tibial tuberosity during Intensive Phase CDT to return limbs to typical size and shape ,. reduce infection risk and improve basic andf instrumental ALDs performance.    Baseline  dependent  01/04/19: achieved 12/31/18 for RLE with 26.73% limb volume reduction below the knee.    Time  12    Period  Weeks    Status   Partially Met      OT LONG TERM GOAL #4   Title  Pt will consistently perform all LE self -care home protocols ( simple self MLD, skin care, therapeutic exercise, compression therapies) daily  with max CG assistance to to achieve max edema reduction, to enanble functional improvements,  including fitting preferred street shoes and standard sized clothing, and to achieve increased AROM and reduced falls risk.    Baseline  dependent 01/04/19: Pt unable to reach feet and distal legs to perform self MLD due to AROM limited by body habitus. Pt will benefit from Flexitouch sequential 01/11/19 : Pt reports ~85% compliance. pneumatic compression device ("pump") for long term LE self management.    Period  Weeks    Status  Partially Met      OT LONG TERM GOAL #5   Title  Patient will understand the importance of weight management, increased activity level, and leg elevation for optimal control of limb swelling over time.    Baseline  dependent    Time  12    Period  Weeks    Status  Achieved      OT LONG TERM GOAL #6   Title  During self-management phase of CDT Pt will retain volumetric reduction bilaterally over 6 month visit interval with no greater than 3% upward fluctuation to limit progression of chronic leg swelling and related skin changes, and to reduce infection risk.    Baseline  Max A    Time  6    Period  Months    Status  New            Plan - 02/25/19 1240    Clinical Impression Statement  Provided MLD, skin care and compression wraps to RL:E as established. Pt continues to demonstrate progress towards goals . We are looking forward to fitting R custom garments and commencing CDT to LLE. Cont as per POC.    OT Occupational Profile and History  Comprehensive Assessment- Review of records and extensive additional review of physical, cognitive, psychosocial history related to current functional performance    Occupational performance deficits (Please refer to evaluation for details):   ADL's;IADL's;Leisure;Social Participation;Rest and Sleep;Other;Work   Engineer, building services, productive activities, role performance   Body Structure / Function / Physical Skills  ADL;Obesity;Decreased knowledge of precautions;Balance;Body mechanics;Decreased knowledge of use of DME;Flexibility;IADL;Pain;Skin integrity;Strength;Edema;Gait;Mobility;ROM    Rehab Potential  Good    Clinical Decision Making  Multiple treatment options, significant modification of task necessary    Comorbidities Affecting Occupational Performance:  Presence of comorbidities impacting occupational performance  Comorbidities impacting occupational performance description:  see SUBJECTIVE    Modification or Assistance to Complete Evaluation   Max significant modification of tasks or assist is necessary to complete    OT Frequency  3x / week    OT Duration  12 weeks   consider reducing to 2 x weekly depending on level of and cinsistency of CG support   OT Treatment/Interventions  Self-care/ADL training;Therapeutic exercise;DME and/or AE instruction;Therapist, nutritional;Compression bandaging;Other (comment);Manual Therapy;Patient/family education;Therapeutic activities;Manual lymph drainage;Energy conservation    Plan  Intensive Phase CDT= Manual lymphatic drainage, (MLD) skin care to improve tissue health, therapeutic exercise (lymphatic pumping ther ex) and gradient compression wrapping one leg at a time with short stretch compression bandages (knee length in Ms. Grave's case) . Pt edu for LE self care  is ongoing and intensive . Management Pgase CDT is on follow-up PRN basis to support management over time, replacing compression garments, etc.    Recommended Other Services  fit with BLE custom knee high compression stockings, toe caps and convoluted foam HOS devices to limit fibrosis formation ( progression) during HOS    Consulted and Agree with Plan of Care  Patient       Patient will benefit from skilled therapeutic  intervention in order to improve the following deficits and impairments:   Body Structure / Function / Physical Skills: ADL, Obesity, Decreased knowledge of precautions, Balance, Body mechanics, Decreased knowledge of use of DME, Flexibility, IADL, Pain, Skin integrity, Strength, Edema, Gait, Mobility, ROM       Visit Diagnosis: Lymphedema, not elsewhere classified    Problem List There are no active problems to display for this patient.   Andrey Spearman, MS, OTR/L, [facility] 02/25/19 12:43 PM  [place] MAIN [facility] SERVICES [address] [place], [place], [address] Phone: [phone]   Fax:  [phone]  Name: PILAR WESTERGAARD MRN: [id number] Date of Birth: 1955/11/03

## 2019-03-01 ENCOUNTER — Ambulatory Visit: Payer: Medicare Other | Admitting: Occupational Therapy

## 2019-03-01 ENCOUNTER — Other Ambulatory Visit: Payer: Self-pay

## 2019-03-01 DIAGNOSIS — I890 Lymphedema, not elsewhere classified: Secondary | ICD-10-CM | POA: Diagnosis not present

## 2019-03-01 NOTE — Therapy (Signed)
[place] MAIN [facility] SERVICES [address] [place], [place], [address] Phone: [phone]   Fax:  [phone]  Occupational Therapy Treatment  Patient Details  Name: Nancy Riley MRN: [id number] Date of Birth: December 24, 1955 Referring Provider (OT): Delight Stare, MD   Encounter Date: 03/01/2019    Past Medical History:  Diagnosis Date  . Diabetes mellitus without complication ([place])   . Hypercholesteremia   . Hypertension     Past Surgical History:  Procedure Laterality Date  . BREAST EXCISIONAL BIOPSY Left 1970   negative  . COLONOSCOPY      There were no vitals filed for this visit.  Subjective Assessment - 03/01/19 1144    Subjective   Shaune Spittle from [place] attends clinical session today to assist Pt with demonstration and trial of basic lympha press sequential comprression device (""pump"). Pt was unable to don pants style compression garment due to body habitus and back pain, which allows treatment above inguinal NN in the abdomen. INstead Pt is able to use standard leg sleeve to treat RLE with 30 mmHg for 45 minutes . Pt demonstrated no SOB and reported no increased pain to leg or knee joint. Although this device does not provide sequencial compression from proximal to distal n keeping with LE Rx, we did notice palpable softening in medial thigh lobule after session. Pt states she would like to move forward with device. Vendor, Shaune Spittle,  will work with Pt and her MD to complete the process for obtaining the device. Because we have been unable to wrap thighs  this device is necessary to soften dense edema in thighs  to facilitate improved  venous and lymphartic function . Cont as per POC.    Pertinent History  Dx relevant to lymphedema (LE): HTN, DM, neuropathy, DJD, arthritis, sciatica,  chronic back pain, morbid obesity, (BMI 50-59.9)    Repetition  Increases Symptoms    Special Tests  strong + Stemmer sign at base of toes  bilaterally    Pain Onset  Other (comment)   onset "years ago"                  OT Treatments/Exercises (OP) - 03/01/19 0001      ADLs   ADL Education Given  Yes      Manual Therapy   Manual Therapy  Edema management;Manual Lymphatic Drainage (MLD);Compression Bandaging    Manual therapy comments  Lymphapress basic pump trial    Compression Bandaging  RLE cmpression wraps as established             OT Education - 03/01/19 1157    Education Details  Pt edu re use of sequential compression devices as practical means of self management over time    Person(s) Educated  Patient    Methods  Explanation;Demonstration;Tactile cues;Verbal cues;Handout    Comprehension  Verbalized understanding;Returned demonstration;Need further instruction;Verbal cues required;Tactile cues required          OT Long Term Goals - 02/23/19 1200      OT LONG TERM GOAL #1   Title  Pt will demonstrate understanding of lymphedema precautions and signs/ symptoms of cellulitisby identifying 6 items for each using printed Lymphedema Workbook for reference PRN (modified independence) to limit LE progression and infection risk.    Baseline  Max A    Time  4    Period  Days    Status  Achieved      OT LONG TERM GOAL #2  Title  Pt will require Max assist from caregiver who will be able to apply multilayer, gradient compression wraps using correct techniques after skilled training to reduce limb volume and limity LE progression.    Baseline  dependent    Time  4    Period  Days    Status  Achieved      OT LONG TERM GOAL #3   Title  Pt will achieve 10% limb volume reductions  bilaterally from ankle to tibial tuberosity during Intensive Phase CDT to return limbs to typical size and shape ,. reduce infection risk and improve basic andf instrumental ALDs performance.    Baseline  dependent  01/04/19: achieved 12/31/18 for RLE with 26.73% limb volume reduction below the knee.    Time  12     Period  Weeks    Status  Partially Met      OT LONG TERM GOAL #4   Title  Pt will consistently perform all LE self -care home protocols ( simple self MLD, skin care, therapeutic exercise, compression therapies) daily  with max CG assistance to to achieve max edema reduction, to enanble functional improvements,  including fitting preferred street shoes and standard sized clothing, and to achieve increased AROM and reduced falls risk.    Baseline  dependent 01/04/19: Pt unable to reach feet and distal legs to perform self MLD due to AROM limited by body habitus. Pt will benefit from Flexitouch sequential 01/11/19 : Pt reports ~85% compliance. pneumatic compression device ("pump") for long term LE self management.    Period  Weeks    Status  Partially Met      OT LONG TERM GOAL #5   Title  Patient will understand the importance of weight management, increased activity level, and leg elevation for optimal control of limb swelling over time.    Baseline  dependent    Time  12    Period  Weeks    Status  Achieved      OT LONG TERM GOAL #6   Title  During self-management phase of CDT Pt will retain volumetric reduction bilaterally over 6 month visit interval with no greater than 3% upward fluctuation to limit progression of chronic leg swelling and related skin changes, and to reduce infection risk.    Baseline  Max A    Time  6    Period  Months    Status  New            Plan - 03/01/19 1200    Clinical Impression Statement  Pt tolerated trial with basic lymphapress sequential pneumatic compression device    for 1hr at 30 mmHg without SOB or pain/ discomfort, Despite the device providing sequential pneumatic compression from distal foot to proximal inguinal areas, whic is not in keeping with the proximal to distal approach to decongestion of CDT and MLD, we did palpate decreased tissue density in the R medial thigh lobule and     R knee after trial. Pt verbalized understanding of all device  precautions. Patient and vendor will work together going forward to obtain this defice intended to rfacilitate long term LE self management at home over time.    OT Occupational Profile and History  Comprehensive Assessment- Review of records and extensive additional review of physical, cognitive, psychosocial history related to current functional performance    Occupational performance deficits (Please refer to evaluation for details):  ADL's;IADL's;Leisure;Social Participation;Rest and Sleep;Other;Work   body image, productive activities, role performance  Body Structure / Function / Physical Skills  ADL;Obesity;Decreased knowledge of precautions;Balance;Body mechanics;Decreased knowledge of use of DME;Flexibility;IADL;Pain;Skin integrity;Strength;Edema;Gait;Mobility;ROM    Rehab Potential  Good    Clinical Decision Making  Multiple treatment options, significant modification of task necessary    Comorbidities Affecting Occupational Performance:  Presence of comorbidities impacting occupational performance    Comorbidities impacting occupational performance description:  see SUBJECTIVE    Modification or Assistance to Complete Evaluation   Max significant modification of tasks or assist is necessary to complete    OT Frequency  3x / week    OT Duration  12 weeks   consider reducing to 2 x weekly depending on level of and cinsistency of CG support   OT Treatment/Interventions  Self-care/ADL training;Therapeutic exercise;DME and/or AE instruction;Therapist, nutritional;Compression bandaging;Other (comment);Manual Therapy;Patient/family education;Therapeutic activities;Manual lymph drainage;Energy conservation    Plan  Intensive Phase CDT= Manual lymphatic drainage, (MLD) skin care to improve tissue health, therapeutic exercise (lymphatic pumping ther ex) and gradient compression wrapping one leg at a time with short stretch compression bandages (knee length in Ms. Grave's case) . Pt edu for LE  self care  is ongoing and intensive . Management Pgase CDT is on follow-up PRN basis to support management over time, replacing compression garments, etc.    Recommended Other Services  fit with BLE custom knee high compression stockings, toe caps and convoluted foam HOS devices to limit fibrosis formation ( progression) during HOS    Consulted and Agree with Plan of Care  Patient       Patient will benefit from skilled therapeutic intervention in order to improve the following deficits and impairments:   Body Structure / Function / Physical Skills: ADL, Obesity, Decreased knowledge of precautions, Balance, Body mechanics, Decreased knowledge of use of DME, Flexibility, IADL, Pain, Skin integrity, Strength, Edema, Gait, Mobility, ROM       Visit Diagnosis: Lymphedema, not elsewhere classified    Problem List There are no active problems to display for this patient.   Andrey Spearman, MS, OTR/L, [facility] 03/01/19 12:05 PM  [place] MAIN [facility] SERVICES [address] [place], [place], [address] Phone: [phone]   Fax:  [phone]  Name: HARNOOR RETA MRN: [id number] Date of Birth: [birth year]

## 2019-03-02 ENCOUNTER — Other Ambulatory Visit: Payer: Self-pay

## 2019-03-02 ENCOUNTER — Ambulatory Visit: Payer: Medicare Other | Admitting: Occupational Therapy

## 2019-03-02 DIAGNOSIS — I890 Lymphedema, not elsewhere classified: Secondary | ICD-10-CM

## 2019-03-02 NOTE — Therapy (Signed)
[place] MAIN [facility] SERVICES [address] [place], [place], [address] Phone: [phone]   Fax:  [phone]  Occupational Therapy Treatment  Patient Details  Name: Nancy Riley MRN: [id number] Date of Birth: Aug 07, 1955 Referring Provider (OT): Delight Stare, MD   Encounter Date: 03/02/2019  OT End of Session - 03/02/19 1250    Visit Number  33    Number of Visits  36    Date for OT Re-Evaluation  05/31/19    OT Start Time  1010    OT Stop Time  1110    OT Time Calculation (min)  60 min    Activity Tolerance  Patient tolerated treatment well;No increased pain   limited by body habitus. Unable to reach feet and lower legs 2/2 girth   Behavior During Therapy  [facility] for tasks assessed/performed       Past Medical History:  Diagnosis Date  . Diabetes mellitus without complication ([place])   . Hypercholesteremia   . Hypertension     Past Surgical History:  Procedure Laterality Date  . BREAST EXCISIONAL BIOPSY Left 1970   negative  . COLONOSCOPY      There were no vitals filed for this visit.  Subjective Assessment - 03/02/19 1249    Subjective   Pt present for OT visit 33/ 36 to address BLE lymphedema. Pt denies increased back  and leg pain after "pump" trial at last visit.    Pertinent History  Dx relevant to lymphedema (LE): HTN, DM, neuropathy, DJD, arthritis, sciatica,  chronic back pain, morbid obesity, (BMI 50-59.9)    Repetition  Increases Symptoms    Special Tests  strong + Stemmer sign at base of toes bilaterally    Pain Onset  Other (comment)   onset "years ago"                  OT Treatments/Exercises (OP) - 03/02/19 0001      ADLs   ADL Education Given  Yes      Manual Therapy   Manual Therapy  Edema management;Manual Lymphatic Drainage (MLD);Compression Bandaging    Manual Lymphatic Drainage (MLD)  MLD to RLE using short neck sequence , deep diaphragmatic breathing and functional inguinal pathways.     Compression Bandaging  RLE cmpression wraps as established             OT Education - 03/02/19 1256    Education Details  Continued skilled Pt/caregiver education  And LE ADL training throughout visit for lymphedema self care/ home program, including compression wrapping, compression garment and device wear/care, lymphatic pumping ther ex, simple self-MLD, and skin care. Discussed progress towards goals.    Person(s) Educated  Patient    Methods  Explanation;Demonstration;Tactile cues;Verbal cues;Handout    Comprehension  Verbalized understanding;Returned demonstration;Need further instruction;Verbal cues required;Tactile cues required          OT Long Term Goals - 02/23/19 1200      OT LONG TERM GOAL #1   Title  Pt will demonstrate understanding of lymphedema precautions and signs/ symptoms of cellulitisby identifying 6 items for each using printed Lymphedema Workbook for reference PRN (modified independence) to limit LE progression and infection risk.    Baseline  Max A    Time  4    Period  Days    Status  Achieved      OT LONG TERM GOAL #2   Title  Pt will require Max assist from caregiver who will be able  to apply multilayer, gradient compression wraps using correct techniques after skilled training to reduce limb volume and limity LE progression.    Baseline  dependent    Time  4    Period  Days    Status  Achieved      OT LONG TERM GOAL #3   Title  Pt will achieve 10% limb volume reductions  bilaterally from ankle to tibial tuberosity during Intensive Phase CDT to return limbs to typical size and shape ,. reduce infection risk and improve basic andf instrumental ALDs performance.    Baseline  dependent  01/04/19: achieved 12/31/18 for RLE with 26.73% limb volume reduction below the knee.    Time  12    Period  Weeks    Status  Partially Met      OT LONG TERM GOAL #4   Title  Pt will consistently perform all LE self -care home protocols ( simple self MLD, skin care,  therapeutic exercise, compression therapies) daily  with max CG assistance to to achieve max edema reduction, to enanble functional improvements,  including fitting preferred street shoes and standard sized clothing, and to achieve increased AROM and reduced falls risk.    Baseline  dependent 01/04/19: Pt unable to reach feet and distal legs to perform self MLD due to AROM limited by body habitus. Pt will benefit from Flexitouch sequential 01/11/19 : Pt reports ~85% compliance. pneumatic compression device ("pump") for long term LE self management.    Period  Weeks    Status  Partially Met      OT LONG TERM GOAL #5   Title  Patient will understand the importance of weight management, increased activity level, and leg elevation for optimal control of limb swelling over time.    Baseline  dependent    Time  12    Period  Weeks    Status  Achieved      OT LONG TERM GOAL #6   Title  During self-management phase of CDT Pt will retain volumetric reduction bilaterally over 6 month visit interval with no greater than 3% upward fluctuation to limit progression of chronic leg swelling and related skin changes, and to reduce infection risk.    Baseline  Max A    Time  6    Period  Months    Status  New            Plan - 03/02/19 1252    Clinical Impression Statement  Pt has made slow , but steady progress towards all OT goals for lymphedema care for RLE thus far. Limb volume is dramatically decreased tissue integrity is improved. Despite significant clincal gains, fatty fibrosis and dense , high protein hyperplasia persists at ankles and medial thigh lobule on L. Pt has demonstrated >85% compliance with home program with assistance from family, and although reduced,  dense tissue fibrosis has not resolved. It is doubtful at this stage that it will due to the advanced stage of progression. Pt awaiting delivery of RLE custom compression knee high. She is awaiting delivery and fitting of sequential  pneumatic compression device. Once these tools are in place we can shift focus to LLE CDT. Cont as per POC.    OT Occupational Profile and History  Comprehensive Assessment- Review of records and extensive additional review of physical, cognitive, psychosocial history related to current functional performance    Occupational performance deficits (Please refer to evaluation for details):  ADL's;IADL's;Leisure;Social Participation;Rest and Sleep;Other;Work   body image, productive  activities, role performance   Body Structure / Function / Physical Skills  ADL;Obesity;Decreased knowledge of precautions;Balance;Body mechanics;Decreased knowledge of use of DME;Flexibility;IADL;Pain;Skin integrity;Strength;Edema;Gait;Mobility;ROM    Rehab Potential  Good    Clinical Decision Making  Multiple treatment options, significant modification of task necessary    Comorbidities Affecting Occupational Performance:  Presence of comorbidities impacting occupational performance    Comorbidities impacting occupational performance description:  see SUBJECTIVE    Modification or Assistance to Complete Evaluation   Max significant modification of tasks or assist is necessary to complete    OT Frequency  3x / week    OT Duration  12 weeks   consider reducing to 2 x weekly depending on level of and cinsistency of CG support   OT Treatment/Interventions  Self-care/ADL training;Therapeutic exercise;DME and/or AE instruction;Therapist, nutritional;Compression bandaging;Other (comment);Manual Therapy;Patient/family education;Therapeutic activities;Manual lymph drainage;Energy conservation    Plan  Intensive Phase CDT= Manual lymphatic drainage, (MLD) skin care to improve tissue health, therapeutic exercise (lymphatic pumping ther ex) and gradient compression wrapping one leg at a time with short stretch compression bandages (knee length in Ms. Grave's case) . Pt edu for LE self care  is ongoing and intensive . Management  Pgase CDT is on follow-up PRN basis to support management over time, replacing compression garments, etc.    Recommended Other Services  fit with BLE custom knee high compression stockings, toe caps and convoluted foam HOS devices to limit fibrosis formation ( progression) during HOS    Consulted and Agree with Plan of Care  Patient       Patient will benefit from skilled therapeutic intervention in order to improve the following deficits and impairments:   Body Structure / Function / Physical Skills: ADL, Obesity, Decreased knowledge of precautions, Balance, Body mechanics, Decreased knowledge of use of DME, Flexibility, IADL, Pain, Skin integrity, Strength, Edema, Gait, Mobility, ROM       Visit Diagnosis: Lymphedema, not elsewhere classified    Problem List There are no active problems to display for this patient.   Ansel Bong 03/02/2019, 1:03 PM  [place] MAIN [facility] SERVICES [address] [place], [place], [address] Phone: [phone]   Fax:  [phone]  Name: Nancy Riley MRN: [id number] Date of Birth: 12/21/1955

## 2019-03-02 NOTE — Therapy (Signed)
[place] MAIN [facility] SERVICES [address] [place], [place], [address] Phone: [phone]   Fax:  [phone]  Occupational Therapy Treatment Note, Progress Report and Recertification  Patient Details  Name: Nancy Riley MRN: [id number] Date of Birth: Aug 11, 1955 Referring Provider (OT): Delight Stare, MD   Encounter Date: 03/02/2019  OT End of Session - 03/02/19 1250    Visit Number  33    Number of Visits  36    Date for OT Re-Evaluation  05/31/19    OT Start Time  1010    OT Stop Time  1110    OT Time Calculation (min)  60 min    Activity Tolerance  Patient tolerated treatment well;No increased pain   limited by body habitus. Unable to reach feet and lower legs 2/2 girth   Behavior During Therapy  [facility] for tasks assessed/performed       Past Medical History:  Diagnosis Date  . Diabetes mellitus without complication ([place])   . Hypercholesteremia   . Hypertension     Past Surgical History:  Procedure Laterality Date  . BREAST EXCISIONAL BIOPSY Left 1970   negative  . COLONOSCOPY      There were no vitals filed for this visit.  Subjective Assessment - 03/02/19 1249    Subjective   Pt present for OT visit 33/ 36 to address BLE lymphedema. Pt denies increased back  and leg pain after "pump" trial at last visit.    Pertinent History  Dx relevant to lymphedema (LE): HTN, DM, neuropathy, DJD, arthritis, sciatica,  chronic back pain, morbid obesity, (BMI 50-59.9)    Repetition  Increases Symptoms    Special Tests  strong + Stemmer sign at base of toes bilaterally    Pain Onset  Other (comment)   onset "years ago"                  OT Treatments/Exercises (OP) - 03/02/19 0001      ADLs   ADL Education Given  Yes      Manual Therapy   Manual Therapy  Edema management;Manual Lymphatic Drainage (MLD);Compression Bandaging    Manual Lymphatic Drainage (MLD)  MLD to RLE using short neck sequence , deep diaphragmatic  breathing and functional inguinal pathways.    Compression Bandaging  RLE cmpression wraps as established             OT Education - 03/02/19 1256    Education Details  Continued skilled Pt/caregiver education  And LE ADL training throughout visit for lymphedema self care/ home program, including compression wrapping, compression garment and device wear/care, lymphatic pumping ther ex, simple self-MLD, and skin care. Discussed progress towards goals.    Person(s) Educated  Patient    Methods  Explanation;Demonstration;Tactile cues;Verbal cues;Handout    Comprehension  Verbalized understanding;Returned demonstration;Need further instruction;Verbal cues required;Tactile cues required          OT Long Term Goals - 02/23/19 1200      OT LONG TERM GOAL #1   Title  Pt will demonstrate understanding of lymphedema precautions and signs/ symptoms of cellulitisby identifying 6 items for each using printed Lymphedema Workbook for reference PRN (modified independence) to limit LE progression and infection risk.    Baseline  Max A    Time  4    Period  Days    Status  Achieved      OT LONG TERM GOAL #2   Title  Pt will require Max assist from  caregiver who will be able to apply multilayer, gradient compression wraps using correct techniques after skilled training to reduce limb volume and limity LE progression.    Baseline  dependent    Time  4    Period  Days    Status  Achieved      OT LONG TERM GOAL #3   Title  Pt will achieve 10% limb volume reductions  bilaterally from ankle to tibial tuberosity during Intensive Phase CDT to return limbs to typical size and shape ,. reduce infection risk and improve basic andf instrumental ALDs performance.    Baseline  dependent  01/04/19: achieved 12/31/18 for RLE with 26.73% limb volume reduction below the knee.    Time  12    Period  Weeks    Status  Partially Met      OT LONG TERM GOAL #4   Title  Pt will consistently perform all LE self -care  home protocols ( simple self MLD, skin care, therapeutic exercise, compression therapies) daily  with max CG assistance to to achieve max edema reduction, to enanble functional improvements,  including fitting preferred street shoes and standard sized clothing, and to achieve increased AROM and reduced falls risk.    Baseline  dependent 01/04/19: Pt unable to reach feet and distal legs to perform self MLD due to AROM limited by body habitus. Pt will benefit from Flexitouch sequential 01/11/19 : Pt reports ~85% compliance. pneumatic compression device ("pump") for long term LE self management.    Period  Weeks    Status  Partially Met      OT LONG TERM GOAL #5   Title  Patient will understand the importance of weight management, increased activity level, and leg elevation for optimal control of limb swelling over time.    Baseline  dependent    Time  12    Period  Weeks    Status  Achieved      OT LONG TERM GOAL #6   Title  During self-management phase of CDT Pt will retain volumetric reduction bilaterally over 6 month visit interval with no greater than 3% upward fluctuation to limit progression of chronic leg swelling and related skin changes, and to reduce infection risk.    Baseline  Max A    Time  6    Period  Months    Status  New            Plan - 03/02/19 1252    Clinical Impression Statement  Pt has made slow , but steady progress towards all OT goals for lymphedema care for RLE thus far. Limb volume is dramatically decreased tissue integrity is improved. Despite significant clincal gains, fatty fibrosis and dense , high protein hyperplasia persists at ankles and medial thigh lobule on L. Pt has demonstrated >85% compliance with home program with assistance from family, and although reduced,  dense tissue fibrosis has not resolved. It is doubtful at this stage that it will due to the advanced stage of progression. Pt awaiting delivery of RLE custom compression knee high. She is  awaiting delivery and fitting of sequential pneumatic compression device. Once these tools are in place we can shift focus to LLE CDT. Cont as per POC.    OT Occupational Profile and History  Comprehensive Assessment- Review of records and extensive additional review of physical, cognitive, psychosocial history related to current functional performance    Occupational performance deficits (Please refer to evaluation for details):  ADL's;IADL's;Leisure;Social Participation;Rest and Sleep;Other;Work  body image, productive activities, role performance   Body Structure / Function / Physical Skills  ADL;Obesity;Decreased knowledge of precautions;Balance;Body mechanics;Decreased knowledge of use of DME;Flexibility;IADL;Pain;Skin integrity;Strength;Edema;Gait;Mobility;ROM    Rehab Potential  Good    Clinical Decision Making  Multiple treatment options, significant modification of task necessary    Comorbidities Affecting Occupational Performance:  Presence of comorbidities impacting occupational performance    Comorbidities impacting occupational performance description:  see SUBJECTIVE    Modification or Assistance to Complete Evaluation   Max significant modification of tasks or assist is necessary to complete    OT Frequency  3x / week    OT Duration  12 weeks   consider reducing to 2 x weekly depending on level of and cinsistency of CG support   OT Treatment/Interventions  Self-care/ADL training;Therapeutic exercise;DME and/or AE instruction;Therapist, nutritional;Compression bandaging;Other (comment);Manual Therapy;Patient/family education;Therapeutic activities;Manual lymph drainage;Energy conservation    Plan  Intensive Phase CDT= Manual lymphatic drainage, (MLD) skin care to improve tissue health, therapeutic exercise (lymphatic pumping ther ex) and gradient compression wrapping one leg at a time with short stretch compression bandages (knee length in Ms. Grave's case) . Pt edu for LE self  care  is ongoing and intensive . Management Pgase CDT is on follow-up PRN basis to support management over time, replacing compression garments, etc.    Recommended Other Services  fit with BLE custom knee high compression stockings, toe caps and convoluted foam HOS devices to limit fibrosis formation ( progression) during HOS    Consulted and Agree with Plan of Care  Patient       Patient will benefit from skilled therapeutic intervention in order to improve the following deficits and impairments:   Body Structure / Function / Physical Skills: ADL, Obesity, Decreased knowledge of precautions, Balance, Body mechanics, Decreased knowledge of use of DME, Flexibility, IADL, Pain, Skin integrity, Strength, Edema, Gait, Mobility, ROM       Visit Diagnosis: Lymphedema, not elsewhere classified - Plan: Ot plan of care cert/re-cert    Problem List There are no active problems to display for this patient.   Andrey Spearman, MS, OTR/L, CLT-LANA 03/02/19 1:05 PM  [place] MAIN [facility] SERVICES [address] [place], [place], [address] Phone: [phone]   Fax:  [phone]  Name: Nancy Riley MRN: [id number] Date of Birth: [birth year]

## 2019-03-04 ENCOUNTER — Ambulatory Visit: Payer: Medicare Other | Attending: Family Medicine | Admitting: Occupational Therapy

## 2019-03-04 ENCOUNTER — Other Ambulatory Visit: Payer: Self-pay

## 2019-03-04 DIAGNOSIS — I890 Lymphedema, not elsewhere classified: Secondary | ICD-10-CM | POA: Insufficient documentation

## 2019-03-04 NOTE — Therapy (Signed)
[place] MAIN [facility] SERVICES [address] [place], [place], [address] Phone: [phone]   Fax:  [phone]  Occupational Therapy Treatment  Patient Details  Name: Nancy Riley MRN: [id number] Date of Birth: 12-05-1955 Referring Provider (OT): Delight Stare, MD   Encounter Date: 03/04/2019  OT End of Session - 03/04/19 4825    Visit Number  34    Number of Visits  36    Date for OT Re-Evaluation  05/31/19    OT Start Time  0910    OT Stop Time  1005    OT Time Calculation (min)  55 min    Activity Tolerance  Patient tolerated treatment well;No increased pain   limited by body habitus. Unable to reach feet and lower legs 2/2 girth   Behavior During Therapy  [facility] for tasks assessed/performed       Past Medical History:  Diagnosis Date  . Diabetes mellitus without complication ([place])   . Hypercholesteremia   . Hypertension     Past Surgical History:  Procedure Laterality Date  . BREAST EXCISIONAL BIOPSY Left 1970   negative  . COLONOSCOPY      There were no vitals filed for this visit.  Subjective Assessment - 03/04/19 1013    Subjective   Pt present for OT visit 34/ 36 to address BLE lymphedema. Pt reports leg and back pain are unchanged since last visit. Pt reports she has not heard from DME vendors between our visits regarding status of sequential pump or custom compression.    Pertinent History  Dx relevant to lymphedema (LE): HTN, DM, neuropathy, DJD, arthritis, sciatica,  chronic back pain, morbid obesity, (BMI 50-59.9)    Repetition  Increases Symptoms    Special Tests  strong + Stemmer sign at base of toes bilaterally    Pain Onset  Other (comment)   onset "years ago"                  OT Treatments/Exercises (OP) - 03/04/19 0001      ADLs   ADL Education Given  Yes      Manual Therapy   Manual Therapy  Edema management;Manual Lymphatic Drainage (MLD);Compression Bandaging    Manual Lymphatic  Drainage (MLD)  MLD to RLE using short neck sequence , deep diaphragmatic breathing and functional inguinal pathways.    Compression Bandaging  RLE cmpression wraps as established             OT Education - 03/04/19 1241    Education Details  Continued skilled Pt/caregiver education  And LE ADL training throughout visit for lymphedema self care/ home program, including compression wrapping, compression garment and device wear/care, lymphatic pumping ther ex, simple self-MLD, and skin care. Discussed progress towards goals.    Person(s) Educated  Patient    Methods  Explanation;Demonstration;Tactile cues;Verbal cues;Handout    Comprehension  Verbalized understanding;Returned demonstration;Need further instruction;Verbal cues required;Tactile cues required          OT Long Term Goals - 02/23/19 1200      OT LONG TERM GOAL #1   Title  Pt will demonstrate understanding of lymphedema precautions and signs/ symptoms of cellulitisby identifying 6 items for each using printed Lymphedema Workbook for reference PRN (modified independence) to limit LE progression and infection risk.    Baseline  Max A    Time  4    Period  Days    Status  Achieved      OT LONG TERM  GOAL #2   Title  Pt will require Max assist from caregiver who will be able to apply multilayer, gradient compression wraps using correct techniques after skilled training to reduce limb volume and limity LE progression.    Baseline  dependent    Time  4    Period  Days    Status  Achieved      OT LONG TERM GOAL #3   Title  Pt will achieve 10% limb volume reductions  bilaterally from ankle to tibial tuberosity during Intensive Phase CDT to return limbs to typical size and shape ,. reduce infection risk and improve basic andf instrumental ALDs performance.    Baseline  dependent  01/04/19: achieved 12/31/18 for RLE with 26.73% limb volume reduction below the knee.    Time  12    Period  Weeks    Status  Partially Met      OT  LONG TERM GOAL #4   Title  Pt will consistently perform all LE self -care home protocols ( simple self MLD, skin care, therapeutic exercise, compression therapies) daily  with max CG assistance to to achieve max edema reduction, to enanble functional improvements,  including fitting preferred street shoes and standard sized clothing, and to achieve increased AROM and reduced falls risk.    Baseline  dependent 01/04/19: Pt unable to reach feet and distal legs to perform self MLD due to AROM limited by body habitus. Pt will benefit from Flexitouch sequential 01/11/19 : Pt reports ~85% compliance. pneumatic compression device ("pump") for long term LE self management.    Period  Weeks    Status  Partially Met      OT LONG TERM GOAL #5   Title  Patient will understand the importance of weight management, increased activity level, and leg elevation for optimal control of limb swelling over time.    Baseline  dependent    Time  12    Period  Weeks    Status  Achieved      OT LONG TERM GOAL #6   Title  During self-management phase of CDT Pt will retain volumetric reduction bilaterally over 6 month visit interval with no greater than 3% upward fluctuation to limit progression of chronic leg swelling and related skin changes, and to reduce infection risk.    Baseline  Max A    Time  6    Period  Months    Status  New            Plan - 03/04/19 1245    Clinical Impression Statement  Nancy Riley tolerated manual therapies to RLE below the knee today without difficulties. Skin continues to be dry and slough off when removing wraps at each visit. No signs or symptoms of infection observed, but skin at anterior R ankle is slightly reddened and irritated  2/2 wrap friction. Pt denies pain. Cont as per POC.    OT Occupational Profile and History  Comprehensive Assessment- Review of records and extensive additional review of physical, cognitive, psychosocial history related to current functional performance     Occupational performance deficits (Please refer to evaluation for details):  ADL's;IADL's;Leisure;Social Participation;Rest and Sleep;Other;Work   Engineer, building services, productive activities, role performance   Body Structure / Function / Physical Skills  ADL;Obesity;Decreased knowledge of precautions;Balance;Body mechanics;Decreased knowledge of use of DME;Flexibility;IADL;Pain;Skin integrity;Strength;Edema;Gait;Mobility;ROM    Rehab Potential  Good    Clinical Decision Making  Multiple treatment options, significant modification of task necessary    Comorbidities Affecting  Occupational Performance:  Presence of comorbidities impacting occupational performance    Comorbidities impacting occupational performance description:  see SUBJECTIVE    Modification or Assistance to Complete Evaluation   Max significant modification of tasks or assist is necessary to complete    OT Frequency  3x / week    OT Duration  12 weeks   consider reducing to 2 x weekly depending on level of and cinsistency of CG support   OT Treatment/Interventions  Self-care/ADL training;Therapeutic exercise;DME and/or AE instruction;Therapist, nutritional;Compression bandaging;Other (comment);Manual Therapy;Patient/family education;Therapeutic activities;Manual lymph drainage;Energy conservation    Plan  Intensive Phase CDT= Manual lymphatic drainage, (MLD) skin care to improve tissue health, therapeutic exercise (lymphatic pumping ther ex) and gradient compression wrapping one leg at a time with short stretch compression bandages (knee length in Nancy Riley) . Pt edu for LE self care  is ongoing and intensive . Management Pgase CDT is on follow-up PRN basis to support management over time, replacing compression garments, etc.    Recommended Other Services  fit with BLE custom knee high compression stockings, toe caps and convoluted foam HOS devices to limit fibrosis formation ( progression) during HOS    Consulted and Agree with  Plan of Care  Patient       Patient will benefit from skilled therapeutic intervention in order to improve the following deficits and impairments:   Body Structure / Function / Physical Skills: ADL, Obesity, Decreased knowledge of precautions, Balance, Body mechanics, Decreased knowledge of use of DME, Flexibility, IADL, Pain, Skin integrity, Strength, Edema, Gait, Mobility, ROM       Visit Diagnosis: Lymphedema, not elsewhere classified    Problem List There are no active problems to display for this patient.   Andrey Spearman, Nancy, OTR/L, [facility] 03/04/19 12:50 PM  [place] MAIN [facility] SERVICES [address] [place], [place], [address] Phone: [phone]   Fax:  [phone]  Name: Nancy Riley MRN: [id number] Date of Birth: August 25, 1955

## 2019-03-10 ENCOUNTER — Ambulatory Visit: Payer: Medicare Other | Admitting: Occupational Therapy

## 2019-03-10 ENCOUNTER — Other Ambulatory Visit: Payer: Self-pay

## 2019-03-10 DIAGNOSIS — I890 Lymphedema, not elsewhere classified: Secondary | ICD-10-CM

## 2019-03-10 NOTE — Therapy (Signed)
[place] MAIN [facility] SERVICES [address] [place], [place], [address] Phone: [phone]   Fax:  [phone]  Patient Details  Name: Nancy Riley MRN: [id number] Date of Birth: 18-May-1956 Referring Provider:  Marguerita Merles, MD  Encounter Date: 03/10/2019  Andrey Spearman, MS, OTR/L, [facility] 03/10/19 4:54 PM   [place] MAIN [facility] SERVICES [address] [place], [place], [address] Phone: [phone]   Fax:  [phone]

## 2019-03-12 ENCOUNTER — Ambulatory Visit: Payer: Medicare Other | Admitting: Occupational Therapy

## 2019-03-12 ENCOUNTER — Other Ambulatory Visit: Payer: Self-pay

## 2019-03-12 DIAGNOSIS — I890 Lymphedema, not elsewhere classified: Secondary | ICD-10-CM

## 2019-03-12 NOTE — Therapy (Signed)
[place] MAIN [facility] SERVICES [address] [place], [place], [address] Phone: [phone]   Fax:  [phone]  Occupational Therapy Treatment Note, Progress Report, New Plan of Care  Patient Details  Name: Nancy Riley MRN: [id number] Date of Birth: 1956/01/14 Referring Provider (OT): Delight Stare, MD   Encounter Date: 03/12/2019  OT End of Session - 03/12/19 1112    Visit Number  36    Number of Visits  36    Date for OT Re-Evaluation  06/10/19    OT Start Time  1000    OT Stop Time  1100    OT Time Calculation (min)  60 min    Activity Tolerance  Patient tolerated treatment well;No increased pain   limited by body habitus. Unable to reach feet and lower legs 2/2 girth   Behavior During Therapy  [facility] for tasks assessed/performed       Past Medical History:  Diagnosis Date  . Diabetes mellitus without complication ([place])   . Hypercholesteremia   . Hypertension     Past Surgical History:  Procedure Laterality Date  . BREAST EXCISIONAL BIOPSY Left 1970   negative  . COLONOSCOPY      There were no vitals filed for this visit.  Subjective Assessment - 03/12/19 1000    Subjective   Pt present for OT visit 36/ 36 to address BLE lymphedema. Pt reports back pain is very bad today and kept her awake most of the night.  OT reviewed diagnosis , etiology and typical treatment for degenerative joint disease noted in medical record. Pt encouraged to follow up with MD she saw at [facility]  to consult for plan moving forward. Pt states she feels she is in too much pain at present to participate in PT.    Pertinent History  Dx relevant to lymphedema (LE): HTN, DM, neuropathy, DJD, arthritis, sciatica,  chronic back pain, morbid obesity, (BMI 50-59.9)    Repetition  Increases Symptoms    Special Tests  strong + Stemmer sign at base of toes bilaterally    Currently in Pain?  Yes   chronic back and knee pain unchanged.  not numerically rated  today   Pain Onset  Other (comment)   onset "years ago"                  OT Treatments/Exercises (OP) - 03/12/19 0001      ADLs   ADL Education Given  Yes      Manual Therapy   Manual Therapy  Edema management;Manual Lymphatic Drainage (MLD);Compression Bandaging;Soft tissue mobilization    Soft tissue mobilization  fibrosis techniques at R medial and lateral ankle    Manual Lymphatic Drainage (MLD)  MLD to RLE using short neck sequence , deep diaphragmatic breathing and functional inguinal pathways.    Compression Bandaging  RLE cmpression wraps as established             OT Education - 03/12/19 1006    Education Details  Continued skilled Pt/caregiver education  And LE ADL training throughout visit for lymphedema self care/ home program, including compression wrapping, compression garment and device wear/care, lymphatic pumping ther ex, simple self-MLD, and skin care. Discussed progress towards goals.    Person(s) Educated  Patient    Methods  Explanation;Demonstration;Tactile cues;Verbal cues;Handout    Comprehension  Verbalized understanding;Returned demonstration;Need further instruction;Verbal cues required;Tactile cues required          OT Long Term Goals -  02/23/19 1200      OT LONG TERM GOAL #1   Title  Pt will demonstrate understanding of lymphedema precautions and signs/ symptoms of cellulitisby identifying 6 items for each using printed Lymphedema Workbook for reference PRN (modified independence) to limit LE progression and infection risk.    Baseline  Max A    Time  4    Period  Days    Status  Achieved      OT LONG TERM GOAL #2   Title  Pt will require Max assist from caregiver who will be able to apply multilayer, gradient compression wraps using correct techniques after skilled training to reduce limb volume and limity LE progression.    Baseline  dependent    Time  4    Period  Days    Status  Achieved      OT LONG TERM GOAL #3    Title  Pt will achieve 10% limb volume reductions  bilaterally from ankle to tibial tuberosity during Intensive Phase CDT to return limbs to typical size and shape ,. reduce infection risk and improve basic andf instrumental ALDs performance.    Baseline  dependent  01/04/19: achieved 12/31/18 for RLE with 26.73% limb volume reduction below the knee.    Time  12    Period  Weeks    Status  Partially Met      OT LONG TERM GOAL #4   Title  Pt will consistently perform all LE self -care home protocols ( simple self MLD, skin care, therapeutic exercise, compression therapies) daily  with max CG assistance to to achieve max edema reduction, to enanble functional improvements,  including fitting preferred street shoes and standard sized clothing, and to achieve increased AROM and reduced falls risk.    Baseline  dependent 01/04/19: Pt unable to reach feet and distal legs to perform self MLD due to AROM limited by body habitus. Pt will benefit from Flexitouch sequential 01/11/19 : Pt reports ~85% compliance. pneumatic compression device ("pump") for long term LE self management.    Period  Weeks    Status  Partially Met      OT LONG TERM GOAL #5   Title  Patient will understand the importance of weight management, increased activity level, and leg elevation for optimal control of limb swelling over time.    Baseline  dependent    Time  12    Period  Weeks    Status  Achieved      OT LONG TERM GOAL #6   Title  During self-management phase of CDT Pt will retain volumetric reduction bilaterally over 6 month visit interval with no greater than 3% upward fluctuation to limit progression of chronic leg swelling and related skin changes, and to reduce infection risk.    Baseline  Max A    Time  6    Period  Months    Status  New            Plan - 03/12/19 1113    Clinical Impression Statement  Provided MLD, skin care and compression wraps to RL:E as established. Provided moist heat pack   during MLD  due to reported back pain. . Pt tolerated all modalities well. Awaiting custom comrpession garment delivery and fitting f9or LLE. Pt demonstrates progress towards all OT goals for LYmphedema care. To date we have been unable to shift CDT to LLE as we are awaiting delivery of RLE custom garments for fitting. Delivery has taken an  inordinate amout of time due to charitable funding process, shipping delays from [place] 2/2 Covid. Pt will benefit from continuing CDT beyond original POC to ensure optimal treatment intervention and outcomes for severe BLE lymphedema involvement. Cont OT 2 x weekly for additional 12 weeks.    OT Occupational Profile and History  Comprehensive Assessment- Review of records and extensive additional review of physical, cognitive, psychosocial history related to current functional performance    Occupational performance deficits (Please refer to evaluation for details):  ADL's;IADL's;Leisure;Social Participation;Rest and Sleep;Other;Work   Engineer, building services, productive activities, role performance   Body Structure / Function / Physical Skills  ADL;Obesity;Decreased knowledge of precautions;Balance;Body mechanics;Decreased knowledge of use of DME;Flexibility;IADL;Pain;Skin integrity;Strength;Edema;Gait;Mobility;ROM    Rehab Potential  Good    Clinical Decision Making  Multiple treatment options, significant modification of task necessary    Comorbidities Affecting Occupational Performance:  Presence of comorbidities impacting occupational performance    Comorbidities impacting occupational performance description:  see SUBJECTIVE    Modification or Assistance to Complete Evaluation   Max significant modification of tasks or assist is necessary to complete    OT Frequency  2x / week    OT Duration  12 weeks   consider reducing to 2 x weekly depending on level of and cinsistency of CG support   OT Treatment/Interventions  Self-care/ADL training;Therapeutic exercise;DME and/or AE  instruction;Therapist, nutritional;Compression bandaging;Other (comment);Manual Therapy;Patient/family education;Therapeutic activities;Manual lymph drainage;Energy conservation    Plan  Intensive Phase CDT= Manual lymphatic drainage, (MLD) skin care to improve tissue health, therapeutic exercise (lymphatic pumping ther ex) and gradient compression wrapping one leg at a time with short stretch compression bandages (knee length in Ms. Grave's case) . Pt edu for LE self care  is ongoing and intensive . Management Pgase CDT is on follow-up PRN basis to support management over time, replacing compression garments, etc.    Recommended Other Services  fit with BLE custom knee high compression stockings, toe caps and convoluted foam HOS devices to limit fibrosis formation ( progression) during HOS    Consulted and Agree with Plan of Care  Patient       Patient will benefit from skilled therapeutic intervention in order to improve the following deficits and impairments:   Body Structure / Function / Physical Skills: ADL, Obesity, Decreased knowledge of precautions, Balance, Body mechanics, Decreased knowledge of use of DME, Flexibility, IADL, Pain, Skin integrity, Strength, Edema, Gait, Mobility, ROM       Visit Diagnosis: Lymphedema, not elsewhere classified - Plan: Ot plan of care cert/re-cert    Problem List There are no active problems to display for this patient.   Andrey Spearman, MS, OTR/L, [facility] 03/12/19 11:22 AM  [place] MAIN [facility] SERVICES [address] [place], [place], [address] Phone: [phone]   Fax:  [phone]  Name: Nancy Riley MRN: [id number] Date of Birth: 1955-11-06

## 2019-03-16 ENCOUNTER — Ambulatory Visit: Payer: Medicare Other | Admitting: Occupational Therapy

## 2019-03-16 ENCOUNTER — Other Ambulatory Visit: Payer: Self-pay

## 2019-03-16 DIAGNOSIS — I890 Lymphedema, not elsewhere classified: Secondary | ICD-10-CM | POA: Diagnosis not present

## 2019-03-16 NOTE — Therapy (Signed)
[place] MAIN [facility] SERVICES [address] [place], [place], [address] Phone: [phone]   Fax:  [phone]  Occupational Therapy Treatment  Patient Details  Name: Nancy Riley MRN: [id number] Date of Birth: Oct 10, 1955 Referring Provider (OT): Delight Stare, MD   Encounter Date: 03/16/2019  OT End of Session - 03/16/19 1635    Visit Number  0    Number of Visits  36    Date for OT Re-Evaluation  06/10/19    Authorization Type  36visits on second POC (72 total visits planned)    Activity Tolerance  Patient tolerated treatment well;No increased pain   limited by body habitus. Unable to reach feet and lower legs 2/2 girth   Behavior During Therapy  [facility] for tasks assessed/performed       Past Medical History:  Diagnosis Date  . Diabetes mellitus without complication ([place])   . Hypercholesteremia   . Hypertension     Past Surgical History:  Procedure Laterality Date  . BREAST EXCISIONAL BIOPSY Left 1970   negative  . COLONOSCOPY      There were no vitals filed for this visit.  Subjective Assessment - 03/16/19 1011    Subjective   Pt present for OT visit 1/ 72 to address BLE lymphedema. Pt reports back pain is 3/10, " a little reduced",but knee pain is 7/10, unchanged since last visit. Pt reports she is working on scheduling a f/u visit w/ "spine doctor" at [facility] to explore further options for treatment as chronic pain limits herability to participate in more conservative  PT.    Pertinent History  Dx relevant to lymphedema (LE): HTN, DM, neuropathy, DJD, arthritis, sciatica,  chronic back pain, morbid obesity, (BMI 50-59.9)    Repetition  Increases Symptoms    Special Tests  strong + Stemmer sign at base of toes bilaterally    Pain Onset  Other (comment)   onset "years ago"                  OT Treatments/Exercises (OP) - 03/16/19 0001      ADLs   ADL Education Given  Yes      Manual Therapy   Manual Therapy   Edema management    Soft tissue mobilization  fibrosis techniques at R medial and lateral ankle    Manual Lymphatic Drainage (MLD)  MLD to RLE using short neck sequence , deep diaphragmatic breathing and functional inguinal pathways.    Compression Bandaging  RLE cmpression wraps as established             OT Education - 03/16/19 1634    Education Details  Continued skilled Pt/caregiver education  And LE ADL training throughout visit for lymphedema self care/ home program, including compression wrapping, compression garment and device wear/care, lymphatic pumping ther ex, simple self-MLD, and skin care. Discussed progress towards goals.    Person(s) Educated  Patient    Methods  Explanation;Demonstration;Tactile cues;Verbal cues;Handout    Comprehension  Verbalized understanding;Returned demonstration;Need further instruction;Verbal cues required;Tactile cues required          OT Long Term Goals - 02/23/19 1200      OT LONG TERM GOAL #1   Title  Pt will demonstrate understanding of lymphedema precautions and signs/ symptoms of cellulitisby identifying 6 items for each using printed Lymphedema Workbook for reference PRN (modified independence) to limit LE progression and infection risk.    Baseline  Max A    Time  4  Period  Days    Status  Achieved      OT LONG TERM GOAL #2   Title  Pt will require Max assist from caregiver who will be able to apply multilayer, gradient compression wraps using correct techniques after skilled training to reduce limb volume and limity LE progression.    Baseline  dependent    Time  4    Period  Days    Status  Achieved      OT LONG TERM GOAL #3   Title  Pt will achieve 10% limb volume reductions  bilaterally from ankle to tibial tuberosity during Intensive Phase CDT to return limbs to typical size and shape ,. reduce infection risk and improve basic andf instrumental ALDs performance.    Baseline  dependent  01/04/19: achieved 12/31/18 for RLE  with 26.73% limb volume reduction below the knee.    Time  12    Period  Weeks    Status  Partially Met      OT LONG TERM GOAL #4   Title  Pt will consistently perform all LE self -care home protocols ( simple self MLD, skin care, therapeutic exercise, compression therapies) daily  with max CG assistance to to achieve max edema reduction, to enanble functional improvements,  including fitting preferred street shoes and standard sized clothing, and to achieve increased AROM and reduced falls risk.    Baseline  dependent 01/04/19: Pt unable to reach feet and distal legs to perform self MLD due to AROM limited by body habitus. Pt will benefit from Flexitouch sequential 01/11/19 : Pt reports ~85% compliance. pneumatic compression device ("pump") for long term LE self management.    Period  Weeks    Status  Partially Met      OT LONG TERM GOAL #5   Title  Patient will understand the importance of weight management, increased activity level, and leg elevation for optimal control of limb swelling over time.    Baseline  dependent    Time  12    Period  Weeks    Status  Achieved      OT LONG TERM GOAL #6   Title  During self-management phase of CDT Pt will retain volumetric reduction bilaterally over 6 month visit interval with no greater than 3% upward fluctuation to limit progression of chronic leg swelling and related skin changes, and to reduce infection risk.    Baseline  Max A    Time  6    Period  Months    Status  New            Plan - 03/16/19 1637    Clinical Impression Statement  Pt demonstrated good response to treatment today with decreased volume and tissue density in distal leg and R foot since last visit. Pt has decreased back pain today, but knee pain remains high at 7/10, whic in turn limits mobility and contributes to leg swelling. Custom garments not get delivered for fitting. Cont as per POc.    OT Occupational Profile and History  Comprehensive Assessment- Review of  records and extensive additional review of physical, cognitive, psychosocial history related to current functional performance    Occupational performance deficits (Please refer to evaluation for details):  ADL's;IADL's;Leisure;Social Participation;Rest and Sleep;Other;Work   Engineer, building services, productive activities, role performance   Body Structure / Function / Physical Skills  ADL;Obesity;Decreased knowledge of precautions;Balance;Body mechanics;Decreased knowledge of use of DME;Flexibility;IADL;Pain;Skin integrity;Strength;Edema;Gait;Mobility;ROM    Rehab Potential  Good  Clinical Decision Making  Multiple treatment options, significant modification of task necessary    Comorbidities Affecting Occupational Performance:  Presence of comorbidities impacting occupational performance    Comorbidities impacting occupational performance description:  see SUBJECTIVE    Modification or Assistance to Complete Evaluation   Max significant modification of tasks or assist is necessary to complete    OT Frequency  2x / week    OT Duration  12 weeks   consider reducing to 2 x weekly depending on level of and cinsistency of CG support   OT Treatment/Interventions  Self-care/ADL training;Therapeutic exercise;DME and/or AE instruction;Therapist, nutritional;Compression bandaging;Other (comment);Manual Therapy;Patient/family education;Therapeutic activities;Manual lymph drainage;Energy conservation    Plan  Intensive Phase CDT= Manual lymphatic drainage, (MLD) skin care to improve tissue health, therapeutic exercise (lymphatic pumping ther ex) and gradient compression wrapping one leg at a time with short stretch compression bandages (knee length in Ms. Grave's case) . Pt edu for LE self care  is ongoing and intensive . Management Pgase CDT is on follow-up PRN basis to support management over time, replacing compression garments, etc.    Recommended Other Services  fit with BLE custom knee high compression  stockings, toe caps and convoluted foam HOS devices to limit fibrosis formation ( progression) during HOS    Consulted and Agree with Plan of Care  Patient       Patient will benefit from skilled therapeutic intervention in order to improve the following deficits and impairments:   Body Structure / Function / Physical Skills: ADL, Obesity, Decreased knowledge of precautions, Balance, Body mechanics, Decreased knowledge of use of DME, Flexibility, IADL, Pain, Skin integrity, Strength, Edema, Gait, Mobility, ROM       Visit Diagnosis: Lymphedema, not elsewhere classified    Problem List There are no active problems to display for this patient.   Andrey Spearman, MS, OTR/L, [facility] 03/16/19 4:40 PM  [place] MAIN [facility] SERVICES [address] [place], [place], [address] Phone: [phone]   Fax:  [phone]  Name: Nancy Riley MRN: [id number] Date of Birth: [birth year]

## 2019-03-18 ENCOUNTER — Other Ambulatory Visit: Payer: Self-pay

## 2019-03-18 ENCOUNTER — Ambulatory Visit: Payer: Medicare Other | Admitting: Occupational Therapy

## 2019-03-18 DIAGNOSIS — I890 Lymphedema, not elsewhere classified: Secondary | ICD-10-CM

## 2019-03-22 ENCOUNTER — Ambulatory Visit: Payer: Medicare Other | Admitting: Occupational Therapy

## 2019-03-22 ENCOUNTER — Other Ambulatory Visit: Payer: Self-pay

## 2019-03-22 DIAGNOSIS — I890 Lymphedema, not elsewhere classified: Secondary | ICD-10-CM | POA: Diagnosis not present

## 2019-03-22 NOTE — Therapy (Signed)
[place] MAIN [facility] SERVICES [address] [place], [place], [address] Phone: [phone]   Fax:  [phone]  Occupational Therapy Treatment  Patient Details  Name: LEMMIE STEINHAUS MRN: [id number] Date of Birth: 1955-11-12 Referring Provider (OT): Delight Stare, MD   Encounter Date: 03/18/2019    Past Medical History:  Diagnosis Date  . Diabetes mellitus without complication ([place])   . Hypercholesteremia   . Hypertension     Past Surgical History:  Procedure Laterality Date  . BREAST EXCISIONAL BIOPSY Left 1970   negative  . COLONOSCOPY      There were no vitals filed for this visit.                             OT Long Term Goals - 02/23/19 1200      OT LONG TERM GOAL #1   Title  Pt will demonstrate understanding of lymphedema precautions and signs/ symptoms of cellulitisby identifying 6 items for each using printed Lymphedema Workbook for reference PRN (modified independence) to limit LE progression and infection risk.    Baseline  Max A    Time  4    Period  Days    Status  Achieved      OT LONG TERM GOAL #2   Title  Pt will require Max assist from caregiver who will be able to apply multilayer, gradient compression wraps using correct techniques after skilled training to reduce limb volume and limity LE progression.    Baseline  dependent    Time  4    Period  Days    Status  Achieved      OT LONG TERM GOAL #3   Title  Pt will achieve 10% limb volume reductions  bilaterally from ankle to tibial tuberosity during Intensive Phase CDT to return limbs to typical size and shape ,. reduce infection risk and improve basic andf instrumental ALDs performance.    Baseline  dependent  01/04/19: achieved 12/31/18 for RLE with 26.73% limb volume reduction below the knee.    Time  12    Period  Weeks    Status  Partially Met      OT LONG TERM GOAL #4   Title  Pt will consistently perform all LE self -care  home protocols ( simple self MLD, skin care, therapeutic exercise, compression therapies) daily  with max CG assistance to to achieve max edema reduction, to enanble functional improvements,  including fitting preferred street shoes and standard sized clothing, and to achieve increased AROM and reduced falls risk.    Baseline  dependent 01/04/19: Pt unable to reach feet and distal legs to perform self MLD due to AROM limited by body habitus. Pt will benefit from Flexitouch sequential 01/11/19 : Pt reports ~85% compliance. pneumatic compression device ("pump") for long term LE self management.    Period  Weeks    Status  Partially Met      OT LONG TERM GOAL #5   Title  Patient will understand the importance of weight management, increased activity level, and leg elevation for optimal control of limb swelling over time.    Baseline  dependent    Time  12    Period  Weeks    Status  Achieved      OT LONG TERM GOAL #6   Title  During self-management phase of CDT Pt will retain volumetric reduction bilaterally over 6 month visit interval  with no greater than 3% upward fluctuation to limit progression of chronic leg swelling and related skin changes, and to reduce infection risk.    Baseline  Max A    Time  6    Period  Months    Status  New              Patient will benefit from skilled therapeutic intervention in order to improve the following deficits and impairments:   Body Structure / Function / Physical Skills: ADL, Obesity, Decreased knowledge of precautions, Balance, Body mechanics, Decreased knowledge of use of DME, Flexibility, IADL, Pain, Skin integrity, Strength, Edema, Gait, Mobility, ROM       Visit Diagnosis: Lymphedema, not elsewhere classified    Problem List There are no active problems to display for this patient.   Andrey Spearman, MS, OTR/L, [facility] 03/22/19 1:06 PM  [place] MAIN [facility] SERVICES [address]  [place], [place], [address] Phone: [phone]   Fax:  [phone]  Name: DALEYZA GADOMSKI MRN: [id number] Date of Birth: 11-Nov-1955

## 2019-03-22 NOTE — Therapy (Signed)
[place] MAIN [facility] SERVICES [address] [place], [place], [address] Phone: [phone]   Fax:  [phone]  Occupational Therapy Treatment  Patient Details  Name: Nancy Riley MRN: [id number] Date of Birth: 12-20-1955 Referring Provider (OT): Delight Stare, MD   Encounter Date: 03/22/2019  OT End of Session - 03/22/19 1313    Visit Number  38    Number of Visits  72    Date for OT Re-Evaluation  06/10/19    OT Start Time  1105    OT Stop Time  1638    OT Time Calculation (min)  60 min    Activity Tolerance  Patient tolerated treatment well;No increased pain   limited by body habitus. Unable to reach feet and lower legs 2/2 girth   Behavior During Therapy  [facility] for tasks assessed/performed       Past Medical History:  Diagnosis Date  . Diabetes mellitus without complication ([place])   . Hypercholesteremia   . Hypertension     Past Surgical History:  Procedure Laterality Date  . BREAST EXCISIONAL BIOPSY Left 1970   negative  . COLONOSCOPY      There were no vitals filed for this visit.  Subjective Assessment - 03/22/19 1307    Subjective   Pt present for OT visit 38/ 72 to address BLE lymphedema. Pt reports back and knee pain are unchanged. She reports DME fitter contacted her to arrange time for in home traing and fitting for "pump" device. Pt states she is hopeful device wi;ll decrease thigh swelling and keep her legs frpom getting so bad again.    Pertinent History  Dx relevant to lymphedema (LE): HTN, DM, neuropathy, DJD, arthritis, sciatica,  chronic back pain, morbid obesity, (BMI 50-59.9)    Repetition  Increases Symptoms    Special Tests  strong + Stemmer sign at base of toes bilaterally    Pain Onset  Other (comment)   onset "years ago"                  OT Treatments/Exercises (OP) - 03/22/19 0001      ADLs   ADL Education Given  Yes      Manual Therapy   Manual Therapy  Edema management    Edema  Management  skin care during MLD  w/ low ph castor oil to improve hydration and tissue mobility    Soft tissue mobilization  fibrosis techniques at R medial and lateral ankle    Manual Lymphatic Drainage (MLD)  MLD to RLE using short neck sequence , deep diaphragmatic breathing and functional inguinal pathways.    Compression Bandaging  RLE cmpression wraps as established             OT Education - 03/22/19 1312    Education Details  Reviewed how and when to use lymphapress ( daily on alternating legs, NOT on both legs obn the same day, even at different times). Reviewd device precautions and contraindications.good return    Person(s) Educated  Patient    Methods  Explanation;Demonstration;Tactile cues;Verbal cues;Handout    Comprehension  Verbalized understanding;Returned demonstration;Need further instruction;Verbal cues required;Tactile cues required          OT Long Term Goals - 02/23/19 1200      OT LONG TERM GOAL #1   Title  Pt will demonstrate understanding of lymphedema precautions and signs/ symptoms of cellulitisby identifying 6 items for each using printed Lymphedema Workbook for reference PRN (modified independence) to  limit LE progression and infection risk.    Baseline  Max A    Time  4    Period  Days    Status  Achieved      OT LONG TERM GOAL #2   Title  Pt will require Max assist from caregiver who will be able to apply multilayer, gradient compression wraps using correct techniques after skilled training to reduce limb volume and limity LE progression.    Baseline  dependent    Time  4    Period  Days    Status  Achieved      OT LONG TERM GOAL #3   Title  Pt will achieve 10% limb volume reductions  bilaterally from ankle to tibial tuberosity during Intensive Phase CDT to return limbs to typical size and shape ,. reduce infection risk and improve basic andf instrumental ALDs performance.    Baseline  dependent  01/04/19: achieved 12/31/18 for RLE with 26.73%  limb volume reduction below the knee.    Time  12    Period  Weeks    Status  Partially Met      OT LONG TERM GOAL #4   Title  Pt will consistently perform all LE self -care home protocols ( simple self MLD, skin care, therapeutic exercise, compression therapies) daily  with max CG assistance to to achieve max edema reduction, to enanble functional improvements,  including fitting preferred street shoes and standard sized clothing, and to achieve increased AROM and reduced falls risk.    Baseline  dependent 01/04/19: Pt unable to reach feet and distal legs to perform self MLD due to AROM limited by body habitus. Pt will benefit from Flexitouch sequential 01/11/19 : Pt reports ~85% compliance. pneumatic compression device ("pump") for long term LE self management.    Period  Weeks    Status  Partially Met      OT LONG TERM GOAL #5   Title  Patient will understand the importance of weight management, increased activity level, and leg elevation for optimal control of limb swelling over time.    Baseline  dependent    Time  12    Period  Weeks    Status  Achieved      OT LONG TERM GOAL #6   Title  During self-management phase of CDT Pt will retain volumetric reduction bilaterally over 6 month visit interval with no greater than 3% upward fluctuation to limit progression of chronic leg swelling and related skin changes, and to reduce infection risk.    Baseline  Max A    Time  6    Period  Months    Status  New            Plan - 03/22/19 1315    Clinical Impression Statement  Pt demonstrated good understanding of care , use, precautions and contraindications for using SPCD at home. Pt agrees to call therapist in the future should her medical status change, especially if dx with CHF, cellulitis, non healing wounds  or CKD. Pt tolerated RLE MLD, skin care and compression wraps to knee as established. We are looking forward to fitting custom compression garments  this week, hopefully, as Pt has  tendured       compression wrapping for longer than most Pt's  due to covid-related dealys on DME end. Cont as per POC.    OT Occupational Profile and History  Comprehensive Assessment- Review of records and extensive additional review of physical, cognitive, psychosocial  history related to current functional performance    Occupational performance deficits (Please refer to evaluation for details):  ADL's;IADL's;Leisure;Social Participation;Rest and Sleep;Other;Work   Engineer, building services, productive activities, role performance   Body Structure / Function / Physical Skills  ADL;Obesity;Decreased knowledge of precautions;Balance;Body mechanics;Decreased knowledge of use of DME;Flexibility;IADL;Pain;Skin integrity;Strength;Edema;Gait;Mobility;ROM    Rehab Potential  Good    Clinical Decision Making  Multiple treatment options, significant modification of task necessary    Comorbidities Affecting Occupational Performance:  Presence of comorbidities impacting occupational performance    Comorbidities impacting occupational performance description:  see SUBJECTIVE    Modification or Assistance to Complete Evaluation   Max significant modification of tasks or assist is necessary to complete    OT Frequency  3x / week    OT Duration  12 weeks   consider reducing to 2 x weekly depending on level of and cinsistency of CG support   OT Treatment/Interventions  Self-care/ADL training;Therapeutic exercise;DME and/or AE instruction;Therapist, nutritional;Compression bandaging;Other (comment);Manual Therapy;Patient/family education;Therapeutic activities;Manual lymph drainage;Energy conservation    Plan  Intensive Phase CDT= Manual lymphatic drainage, (MLD) skin care to improve tissue health, therapeutic exercise (lymphatic pumping ther ex) and gradient compression wrapping one leg at a time with short stretch compression bandages (knee length in Ms. Grave's case) . Pt edu for LE self care  is ongoing and intensive .  Management Pgase CDT is on follow-up PRN basis to support management over time, replacing compression garments, etc.    Recommended Other Services  fit with BLE custom knee high compression stockings, toe caps and convoluted foam HOS devices to limit fibrosis formation ( progression) during HOS    Consulted and Agree with Plan of Care  Patient       Patient will benefit from skilled therapeutic intervention in order to improve the following deficits and impairments:   Body Structure / Function / Physical Skills: ADL, Obesity, Decreased knowledge of precautions, Balance, Body mechanics, Decreased knowledge of use of DME, Flexibility, IADL, Pain, Skin integrity, Strength, Edema, Gait, Mobility, ROM       Visit Diagnosis: Lymphedema, not elsewhere classified    Problem List There are no active problems to display for this patient.   Andrey Spearman, MS, OTR/L, [facility] 03/22/19 1:19 PM  [place] MAIN [facility] SERVICES [address] [place], [place], [address] Phone: [phone]   Fax:  [phone]  Name: LOLITHA TORTORA MRN: [id number] Date of Birth: 09-May-1956

## 2019-03-24 ENCOUNTER — Other Ambulatory Visit: Payer: Self-pay

## 2019-03-24 ENCOUNTER — Ambulatory Visit: Payer: Medicare Other | Admitting: Occupational Therapy

## 2019-03-24 DIAGNOSIS — I890 Lymphedema, not elsewhere classified: Secondary | ICD-10-CM | POA: Diagnosis not present

## 2019-03-24 NOTE — Therapy (Signed)
[place] MAIN [facility] SERVICES [address] [place], [place], [address] Phone: [phone]   Fax:  [phone]  Occupational Therapy Treatment  Patient Details  Name: Nancy Riley MRN: [id number] Date of Birth: July 04, 1955 Referring Provider (OT): Delight Stare, MD   Encounter Date: 03/24/2019    Past Medical History:  Diagnosis Date  . Diabetes mellitus without complication ([place])   . Hypercholesteremia   . Hypertension     Past Surgical History:  Procedure Laterality Date  . BREAST EXCISIONAL BIOPSY Left 1970   negative  . COLONOSCOPY      There were no vitals filed for this visit.  Subjective Assessment - 03/24/19 1308    Subjective   Pt present for OT visit 39/ 72 to address BLE lymphedema. Pt brings   new cusstom RLE knee high compression garment to clinic for fitting. Pt reports sequential pneumatic compression device arrived yesterday. OT emailed DME vendor with status update and requested trainer contact Pt to schedule.    Pertinent History  Dx relevant to lymphedema (LE): HTN, DM, neuropathy, DJD, arthritis, sciatica,  chronic back pain, morbid obesity, (BMI 50-59.9)    Repetition  Increases Symptoms    Special Tests  strong + Stemmer sign at base of toes bilaterally    Pain Onset  Other (comment)   onset "years ago"                  OT Treatments/Exercises (OP) - 03/24/19 0001      ADLs   ADL Education Given  Yes      Manual Therapy   Manual Therapy  Edema management;Compression Bandaging    Manual therapy comments  BLE comparative limb volumetrics-    Edema Management  custom,, RLE, Jobst,  Elvarex Classic, knee length compression garment, ccl 3, (25-35 mmHg) fappears to be sliightly too large circumferentiall from mid calf extending proximally to top edge at tibial tuberosity.     Compression Bandaging  LLE cmpression wraps as established                  OT Long Term Goals - 02/23/19  1200      OT LONG TERM GOAL #1   Title  Pt will demonstrate understanding of lymphedema precautions and signs/ symptoms of cellulitisby identifying 6 items for each using printed Lymphedema Workbook for reference PRN (modified independence) to limit LE progression and infection risk.    Baseline  Max A    Time  4    Period  Days    Status  Achieved      OT LONG TERM GOAL #2   Title  Pt will require Max assist from caregiver who will be able to apply multilayer, gradient compression wraps using correct techniques after skilled training to reduce limb volume and limity LE progression.    Baseline  dependent    Time  4    Period  Days    Status  Achieved      OT LONG TERM GOAL #3   Title  Pt will achieve 10% limb volume reductions  bilaterally from ankle to tibial tuberosity during Intensive Phase CDT to return limbs to typical size and shape ,. reduce infection risk and improve basic andf instrumental ALDs performance.    Baseline  dependent  01/04/19: achieved 12/31/18 for RLE with 26.73% limb volume reduction below the knee.    Time  12    Period  Weeks    Status  Partially  Met      OT LONG TERM GOAL #4   Title  Pt will consistently perform all LE self -care home protocols ( simple self MLD, skin care, therapeutic exercise, compression therapies) daily  with max CG assistance to to achieve max edema reduction, to enanble functional improvements,  including fitting preferred street shoes and standard sized clothing, and to achieve increased AROM and reduced falls risk.    Baseline  dependent 01/04/19: Pt unable to reach feet and distal legs to perform self MLD due to AROM limited by body habitus. Pt will benefit from Flexitouch sequential 01/11/19 : Pt reports ~85% compliance. pneumatic compression device ("pump") for long term LE self management.    Period  Weeks    Status  Partially Met      OT LONG TERM GOAL #5   Title  Patient will understand the importance of weight management, increased  activity level, and leg elevation for optimal control of limb swelling over time.    Baseline  dependent    Time  12    Period  Weeks    Status  Achieved      OT LONG TERM GOAL #6   Title  During self-management phase of CDT Pt will retain volumetric reduction bilaterally over 6 month visit interval with no greater than 3% upward fluctuation to limit progression of chronic leg swelling and related skin changes, and to reduce infection risk.    Baseline  Max A    Time  6    Period  Months    Status  New            Plan - 03/24/19 1312    Clinical Impression Statement  Completed fitting of RLE custom knee length compression stocking. custom,, RLE, Jobst,  Elvarex Classic, knee length compression garment, ccl 3, (25-35 mmHg) appears to be slightly too large circumferentially from mid calf extending proximally to top edge at tibial tuberosity. Pt agrees to wear garment and wash it a couple of times during visit interval to assess funtion and fit thoroughly. Pt reports comfort in garment. Provided skin care and commenced compression wraps to LLE as established.    OT Occupational Profile and History  Comprehensive Assessment- Review of records and extensive additional review of physical, cognitive, psychosocial history related to current functional performance    Occupational performance deficits (Please refer to evaluation for details):  ADL's;IADL's;Leisure;Social Participation;Rest and Sleep;Other;Work   Engineer, building services, productive activities, role performance   Body Structure / Function / Physical Skills  ADL;Obesity;Decreased knowledge of precautions;Balance;Body mechanics;Decreased knowledge of use of DME;Flexibility;IADL;Pain;Skin integrity;Strength;Edema;Gait;Mobility;ROM    Rehab Potential  Good    Clinical Decision Making  Multiple treatment options, significant modification of task necessary    Comorbidities Affecting Occupational Performance:  Presence of comorbidities impacting  occupational performance    Comorbidities impacting occupational performance description:  see SUBJECTIVE    Modification or Assistance to Complete Evaluation   Max significant modification of tasks or assist is necessary to complete    OT Frequency  3x / week    OT Duration  12 weeks   consider reducing to 2 x weekly depending on level of and cinsistency of CG support   OT Treatment/Interventions  Self-care/ADL training;Therapeutic exercise;DME and/or AE instruction;Therapist, nutritional;Compression bandaging;Other (comment);Manual Therapy;Patient/family education;Therapeutic activities;Manual lymph drainage;Energy conservation    Plan  Intensive Phase CDT= Manual lymphatic drainage, (MLD) skin care to improve tissue health, therapeutic exercise (lymphatic pumping ther ex) and gradient compression wrapping one leg at  a time with short stretch compression bandages (knee length in Ms. Grave's case) . Pt edu for LE self care  is ongoing and intensive . Management Pgase CDT is on follow-up PRN basis to support management over time, replacing compression garments, etc.    Recommended Other Services  fit with BLE custom knee high compression stockings, toe caps and convoluted foam HOS devices to limit fibrosis formation ( progression) during HOS    Consulted and Agree with Plan of Care  Patient       Patient will benefit from skilled therapeutic intervention in order to improve the following deficits and impairments:   Body Structure / Function / Physical Skills: ADL, Obesity, Decreased knowledge of precautions, Balance, Body mechanics, Decreased knowledge of use of DME, Flexibility, IADL, Pain, Skin integrity, Strength, Edema, Gait, Mobility, ROM       Visit Diagnosis: Lymphedema, not elsewhere classified    Problem List There are no active problems to display for this patient.   Andrey Spearman, MS, OTR/L, [facility] 03/24/19 1:17 PM  [place]  MAIN [facility] SERVICES [address] [place], [place], [address] Phone: [phone]   Fax:  [phone]  Name: Nancy Riley MRN: [id number] Date of Birth: 05-Jun-1955

## 2019-03-29 ENCOUNTER — Ambulatory Visit: Payer: Medicare Other | Admitting: Occupational Therapy

## 2019-03-29 ENCOUNTER — Other Ambulatory Visit: Payer: Self-pay

## 2019-03-29 DIAGNOSIS — I890 Lymphedema, not elsewhere classified: Secondary | ICD-10-CM | POA: Diagnosis not present

## 2019-03-29 NOTE — Therapy (Signed)
[place] MAIN [facility] SERVICES [address] [place], [place], [address] Phone: [phone]   Fax:  [phone]  Occupational Therapy Treatment  Patient Details  Name: LYRIC ROSSANO MRN: [id number] Date of Birth: 07/02/1955 Referring Provider (OT): Delight Stare, MD   Encounter Date: 03/29/2019  OT End of Session - 03/29/19 1305    Visit Number  39    Number of Visits  72    Date for OT Re-Evaluation  06/10/19    OT Start Time  1105    OT Stop Time  4827    OT Time Calculation (min)  60 min    Activity Tolerance  Patient tolerated treatment well;No increased pain   limited by body habitus. Unable to reach feet and lower legs 2/2 girth   Behavior During Therapy  [facility] for tasks assessed/performed       Past Medical History:  Diagnosis Date  . Diabetes mellitus without complication ([place])   . Hypercholesteremia   . Hypertension     Past Surgical History:  Procedure Laterality Date  . BREAST EXCISIONAL BIOPSY Left 1970   negative  . COLONOSCOPY      There were no vitals filed for this visit.  Subjective Assessment - 03/29/19 1300    Subjective   Pt present for OT visit 40/ 72 to address BLE lymphedema. Pt wears new RLE custom garment to clinic. LLE is wrapped as directed using correct techniques.Pt reports RLE garment tends to migrate  up from foot into anterior ankle crease where it causes discomfort.    Pertinent History  Dx relevant to lymphedema (LE): HTN, DM, neuropathy, DJD, arthritis, sciatica,  chronic back pain, morbid obesity, (BMI 50-59.9)    Repetition  Increases Symptoms    Special Tests  strong + Stemmer sign at base of toes bilaterally    Pain Onset  Other (comment)   onset "years ago"                  OT Treatments/Exercises (OP) - 03/29/19 0001      ADLs   ADL Education Given  Yes      Manual Therapy   Manual Therapy  Edema management    Edema Management  repeated anatomical measurements for  sustom RLE compression garm,ent to improve fit and comfort    Soft tissue mobilization  fibrosis techniques at R medial and lateral ankle    Manual Lymphatic Drainage (MLD)  MLD to LLE using short neck sequence , deep diaphragmatic breathing and functional inguinal pathways.    Compression Bandaging  LLE cmpression wraps as established             OT Education - 03/29/19 1304    Education Details  Pt edu for [employer] "It [employer]ays" garment adhesive. Edu for garment remeasurement process and repeat fitting for RLE garments.    Person(s) Educated  Patient    Methods  Explanation;Demonstration;Tactile cues;Verbal cues;Handout    Comprehension  Verbalized understanding;Returned demonstration;Need further instruction;Verbal cues required;Tactile cues required          OT Long Term Goals - 02/23/19 1200      OT LONG TERM GOAL #1   Title  Pt will demonstrate understanding of lymphedema precautions and signs/ symptoms of cellulitisby identifying 6 items for each using printed Lymphedema Workbook for reference PRN (modified independence) to limit LE progression and infection risk.    Baseline  Max A    Time  4    Period  Days  Status  Achieved      OT LONG TERM GOAL #2   Title  Pt will require Max assist from caregiver who will be able to apply multilayer, gradient compression wraps using correct techniques after skilled training to reduce limb volume and limity LE progression.    Baseline  dependent    Time  4    Period  Days    Status  Achieved      OT LONG TERM GOAL #3   Title  Pt will achieve 10% limb volume reductions  bilaterally from ankle to tibial tuberosity during Intensive Phase CDT to return limbs to typical size and shape ,. reduce infection risk and improve basic andf instrumental ALDs performance.    Baseline  dependent  01/04/19: achieved 12/31/18 for RLE with 26.73% limb volume reduction below the knee.    Time  12    Period  Weeks    Status  Partially Met      OT LONG  TERM GOAL #4   Title  Pt will consistently perform all LE self -care home protocols ( simple self MLD, skin care, therapeutic exercise, compression therapies) daily  with max CG assistance to to achieve max edema reduction, to enanble functional improvements,  including fitting preferred street shoes and standard sized clothing, and to achieve increased AROM and reduced falls risk.    Baseline  dependent 01/04/19: Pt unable to reach feet and distal legs to perform self MLD due to AROM limited by body habitus. Pt will benefit from Flexitouch sequential 01/11/19 : Pt reports ~85% compliance. pneumatic compression device ("pump") for long term LE self management.    Period  Weeks    Status  Partially Met      OT LONG TERM GOAL #5   Title  Patient will understand the importance of weight management, increased activity level, and leg elevation for optimal control of limb swelling over time.    Baseline  dependent    Time  12    Period  Weeks    Status  Achieved      OT LONG TERM GOAL #6   Title  During self-management phase of CDT Pt will retain volumetric reduction bilaterally over 6 month visit interval with no greater than 3% upward fluctuation to limit progression of chronic leg swelling and related skin changes, and to reduce infection risk.    Baseline  Max A    Time  6    Period  Months    Status  New            Plan - 03/29/19 1306    Clinical Impression Statement  Custom, RLE compression kee high remeasurements reveal OT's original medial and lateral foot lengths were correct as submitted to DME vendor. We decreased circumference at D in effort to snug fit around calf to limit downqward sliding. Otherwise no additional changes requested for "remake". RLE limb volume is dramatically decreased over Rx interval in clinic over the weekend. Pt demonstrates excellent progress towards RLE goals. Emphasis of MLD today on fibrosis techniques to distal leg and ankle. Cont as per POC.    OT  Occupational Profile and History  Comprehensive Assessment- Review of records and extensive additional review of physical, cognitive, psychosocial history related to current functional performance    Occupational performance deficits (Please refer to evaluation for details):  ADL's;IADL's;Leisure;Social Participation;Rest and Sleep;Other;Work   Engineer, building services, productive activities, role performance   Body Structure / Function / Physical Skills  ADL;Obesity;Decreased knowledge  of precautions;Balance;Body mechanics;Decreased knowledge of use of DME;Flexibility;IADL;Pain;Skin integrity;Strength;Edema;Gait;Mobility;ROM    Rehab Potential  Good    Clinical Decision Making  Multiple treatment options, significant modification of task necessary    Comorbidities Affecting Occupational Performance:  Presence of comorbidities impacting occupational performance    Comorbidities impacting occupational performance description:  see SUBJECTIVE    Modification or Assistance to Complete Evaluation   Max significant modification of tasks or assist is necessary to complete    OT Frequency  3x / week    OT Duration  12 weeks   consider reducing to 2 x weekly depending on level of and cinsistency of CG support   OT Treatment/Interventions  Self-care/ADL training;Therapeutic exercise;DME and/or AE instruction;Therapist, nutritional;Compression bandaging;Other (comment);Manual Therapy;Patient/family education;Therapeutic activities;Manual lymph drainage;Energy conservation    Plan  Intensive Phase CDT= Manual lymphatic drainage, (MLD) skin care to improve tissue health, therapeutic exercise (lymphatic pumping ther ex) and gradient compression wrapping one leg at a time with short stretch compression bandages (knee length in Ms. Grave's case) . Pt edu for LE self care  is ongoing and intensive . Management Pgase CDT is on follow-up PRN basis to support management over time, replacing compression garments, etc.     Recommended Other Services  fit with BLE custom knee high compression stockings, toe caps and convoluted foam HOS devices to limit fibrosis formation ( progression) during HOS    Consulted and Agree with Plan of Care  Patient       Patient will benefit from skilled therapeutic intervention in order to improve the following deficits and impairments:   Body Structure / Function / Physical Skills: ADL, Obesity, Decreased knowledge of precautions, Balance, Body mechanics, Decreased knowledge of use of DME, Flexibility, IADL, Pain, Skin integrity, Strength, Edema, Gait, Mobility, ROM       Visit Diagnosis: Lymphedema, not elsewhere classified    Problem List There are no active problems to display for this patient.  Andrey Spearman, MS, OTR/L, CLT-LANA 03/29/19 1:10 PM    [place] MAIN [facility] SERVICES [address] [place], [place], [address] Phone: [phone]   Fax:  [phone]  Name: ELLINORE MERCED MRN: [id number] Date of Birth: [birth year]

## 2019-03-31 ENCOUNTER — Ambulatory Visit: Payer: Medicare Other | Admitting: Occupational Therapy

## 2019-03-31 ENCOUNTER — Other Ambulatory Visit: Payer: Self-pay

## 2019-03-31 DIAGNOSIS — I890 Lymphedema, not elsewhere classified: Secondary | ICD-10-CM | POA: Diagnosis not present

## 2019-03-31 NOTE — Therapy (Signed)
[place] MAIN [facility] SERVICES [address] [place], [place], [address] Phone: [phone]   Fax:  [phone]  Occupational Therapy Treatment  Patient Details  Name: Nancy Riley MRN: [id number] Date of Birth: May 14, 1956 Referring Provider (OT): Delight Stare, MD   Encounter Date: 03/31/2019  OT End of Session - 03/31/19 1013    Visit Number  40    Number of Visits  72    Date for OT Re-Evaluation  06/10/19    OT Start Time  1000    OT Stop Time  1105    OT Time Calculation (min)  65 min    Activity Tolerance  Patient tolerated treatment well;No increased pain   limited by body habitus. Unable to reach feet and lower legs 2/2 girth   Behavior During Therapy  [facility] for tasks assessed/performed       Past Medical History:  Diagnosis Date  . Diabetes mellitus without complication ([place])   . Hypercholesteremia   . Hypertension     Past Surgical History:  Procedure Laterality Date  . BREAST EXCISIONAL BIOPSY Left 1970   negative  . COLONOSCOPY      There were no vitals filed for this visit.  Subjective Assessment - 03/31/19 1009    Subjective   Pt present for OT visit 41/ 72 to address BLE lymphedema. Pt reports new SPCD trainer will arrive at her home today to assist with set and education. Pt reports back pain is 5/10, and knee pain is 8/10.    Pertinent History  Dx relevant to lymphedema (LE): HTN, DM, neuropathy, DJD, arthritis, sciatica,  chronic back pain, morbid obesity, (BMI 50-59.9)    Repetition  Increases Symptoms    Special Tests  strong + Stemmer sign at base of toes bilaterally    Currently in Pain?  Yes    Pain Onset  Other (comment)   onset "years ago"                  OT Treatments/Exercises (OP) - 03/31/19 0001      ADLs   ADL Education Given  Yes      Manual Therapy   Manual Therapy  Edema management;Manual Lymphatic Drainage (MLD);Compression Bandaging    Soft tissue mobilization  fibrosis  techniques at R medial and lateral ankle    Manual Lymphatic Drainage (MLD)  MLD to LLE using short neck sequence , deep diaphragmatic breathing and functional inguinal pathways.    Compression Bandaging  LLE cmpression wraps as established             OT Education - 03/31/19 1012    Education Details  Pt edu re Covi precautions in home when device trainer visit, Set up times for SPND. LR self care- reviewed MLD.    Person(s) Educated  Patient    Methods  Explanation;Demonstration;Tactile cues;Verbal cues;Handout    Comprehension  Verbalized understanding;Returned demonstration;Need further instruction;Verbal cues required;Tactile cues required          OT Long Term Goals - 02/23/19 1200      OT LONG TERM GOAL #1   Title  Pt will demonstrate understanding of lymphedema precautions and signs/ symptoms of cellulitisby identifying 6 items for each using printed Lymphedema Workbook for reference PRN (modified independence) to limit LE progression and infection risk.    Baseline  Max A    Time  4    Period  Days    Status  Achieved  OT LONG TERM GOAL #2   Title  Pt will require Max assist from caregiver who will be able to apply multilayer, gradient compression wraps using correct techniques after skilled training to reduce limb volume and limity LE progression.    Baseline  dependent    Time  4    Period  Days    Status  Achieved      OT LONG TERM GOAL #3   Title  Pt will achieve 10% limb volume reductions  bilaterally from ankle to tibial tuberosity during Intensive Phase CDT to return limbs to typical size and shape ,. reduce infection risk and improve basic andf instrumental ALDs performance.    Baseline  dependent  01/04/19: achieved 12/31/18 for RLE with 26.73% limb volume reduction below the knee.    Time  12    Period  Weeks    Status  Partially Met      OT LONG TERM GOAL #4   Title  Pt will consistently perform all LE self -care home protocols ( simple self MLD,  skin care, therapeutic exercise, compression therapies) daily  with max CG assistance to to achieve max edema reduction, to enanble functional improvements,  including fitting preferred street shoes and standard sized clothing, and to achieve increased AROM and reduced falls risk.    Baseline  dependent 01/04/19: Pt unable to reach feet and distal legs to perform self MLD due to AROM limited by body habitus. Pt will benefit from Flexitouch sequential 01/11/19 : Pt reports ~85% compliance. pneumatic compression device ("pump") for long term LE self management.    Period  Weeks    Status  Partially Met      OT LONG TERM GOAL #5   Title  Patient will understand the importance of weight management, increased activity level, and leg elevation for optimal control of limb swelling over time.    Baseline  dependent    Time  12    Period  Weeks    Status  Achieved      OT LONG TERM GOAL #6   Title  During self-management phase of CDT Pt will retain volumetric reduction bilaterally over 6 month visit interval with no greater than 3% upward fluctuation to limit progression of chronic leg swelling and related skin changes, and to reduce infection risk.    Baseline  Max A    Time  6    Period  Months    Status  New            Plan - 03/31/19 1218    Clinical Impression Statement  LLE limb volume decreased since last visit by visual assessment evidences by increased skin wrinkles and palpable decongestion of lymphatic fluid. Dense fibrosis at ankle and distal leg is more apparent with increased congestion. Pt reports mild soreness on anterior leg from    tibial tuberosity to anterior ankle. Pt tolerated MLD and compression with no difficulty. Pt tolerating RLE compression garment well. Cnot as per POC.    OT Occupational Profile and History  Comprehensive Assessment- Review of records and extensive additional review of physical, cognitive, psychosocial history related to current functional performance     Occupational performance deficits (Please refer to evaluation for details):  ADL's;IADL's;Leisure;Social Participation;Rest and Sleep;Other;Work   Engineer, building services, productive activities, role performance   Body Structure / Function / Physical Skills  ADL;Obesity;Decreased knowledge of precautions;Balance;Body mechanics;Decreased knowledge of use of DME;Flexibility;IADL;Pain;Skin integrity;Strength;Edema;Gait;Mobility;ROM    Rehab Potential  Good    Clinical Decision  Making  Multiple treatment options, significant modification of task necessary    Comorbidities Affecting Occupational Performance:  Presence of comorbidities impacting occupational performance    Comorbidities impacting occupational performance description:  see SUBJECTIVE    Modification or Assistance to Complete Evaluation   Max significant modification of tasks or assist is necessary to complete    OT Frequency  3x / week    OT Duration  12 weeks   consider reducing to 2 x weekly depending on level of and cinsistency of CG support   OT Treatment/Interventions  Self-care/ADL training;Therapeutic exercise;DME and/or AE instruction;Therapist, nutritional;Compression bandaging;Other (comment);Manual Therapy;Patient/family education;Therapeutic activities;Manual lymph drainage;Energy conservation    Plan  Intensive Phase CDT= Manual lymphatic drainage, (MLD) skin care to improve tissue health, therapeutic exercise (lymphatic pumping ther ex) and gradient compression wrapping one leg at a time with short stretch compression bandages (knee length in Ms. Grave's case) . Pt edu for LE self care  is ongoing and intensive . Management Pgase CDT is on follow-up PRN basis to support management over time, replacing compression garments, etc.    Recommended Other Services  fit with BLE custom knee high compression stockings, toe caps and convoluted foam HOS devices to limit fibrosis formation ( progression) during HOS    Consulted and Agree with Plan  of Care  Patient       Patient will benefit from skilled therapeutic intervention in order to improve the following deficits and impairments:   Body Structure / Function / Physical Skills: ADL, Obesity, Decreased knowledge of precautions, Balance, Body mechanics, Decreased knowledge of use of DME, Flexibility, IADL, Pain, Skin integrity, Strength, Edema, Gait, Mobility, ROM       Visit Diagnosis: Lymphedema, not elsewhere classified    Problem List There are no active problems to display for this patient.   Andrey Spearman, MS, OTR/L, [facility] 03/31/19 12:21 PM   [place] MAIN [facility] SERVICES [address] [place], [place], [address] Phone: [phone]   Fax:  [phone]  Name: Nancy Riley MRN: [id number] Date of Birth: 22-Jul-1955

## 2019-04-05 ENCOUNTER — Other Ambulatory Visit: Payer: Self-pay

## 2019-04-05 ENCOUNTER — Ambulatory Visit: Payer: Medicare Other | Attending: Family Medicine | Admitting: Occupational Therapy

## 2019-04-05 DIAGNOSIS — I890 Lymphedema, not elsewhere classified: Secondary | ICD-10-CM | POA: Diagnosis not present

## 2019-04-05 NOTE — Therapy (Signed)
[place] MAIN [facility] SERVICES [address] [place], [place], [address] Phone: [phone]   Fax:  [phone]  Occupational Therapy Treatment  Patient Details  Name: Nancy Riley MRN: [id number] Date of Birth: [birth year] Referring Provider (OT): Delight Stare, MD   Encounter Date: 04/05/2019  OT End of Session - 04/05/19 1253    Visit Number  41    Number of Visits  72    Date for OT Re-Evaluation  06/10/19    OT Start Time  1105    OT Stop Time  9417    OT Time Calculation (min)  60 min    Activity Tolerance  Patient tolerated treatment well;No increased pain   limited by body habitus. Unable to reach feet and lower legs 2/2 girth   Behavior During Therapy  [facility] for tasks assessed/performed       Past Medical History:  Diagnosis Date  . Diabetes mellitus without complication ([place])   . Hypercholesteremia   . Hypertension     Past Surgical History:  Procedure Laterality Date  . BREAST EXCISIONAL BIOPSY Left 1970   negative  . COLONOSCOPY      There were no vitals filed for this visit.  Subjective Assessment - 04/05/19 1132    Subjective   Pt present for OT visit 42/ 72 to address BLE lymphedema. Pt reports back pain 3/10 and knees are 10/10 this morning. Pt reports she saw spine doctor on Friday and he recommends a MRI. Pt sees her primary MD tomorrow. Pt completed sequential "pump" training last week and has used device daily since. Pt reports she is able to don and doff device leg garment with minimal assistance . Lastly, Pt reports she is very pleased with her new car. Pt drove herself to clinic this morning for the first time since commencing CDT.    Pertinent History  Dx relevant to lymphedema (LE): HTN, DM, neuropathy, DJD, arthritis, sciatica,  chronic back pain, morbid obesity, (BMI 50-59.9)    Repetition  Increases Symptoms    Special Tests  strong + Stemmer sign at base of toes bilaterally    Pain Onset  Other  (comment)   onset "years ago"                  OT Treatments/Exercises (OP) - 04/05/19 0001      ADLs   ADL Education Given  Yes      Manual Therapy   Manual Therapy  Edema management;Manual Lymphatic Drainage (MLD);Compression Bandaging;Soft tissue mobilization    Soft tissue mobilization  fibrosis techniques at R medial and lateral ankle    Manual Lymphatic Drainage (MLD)  MLD to LLE using short neck sequence , deep diaphragmatic breathing and functional inguinal pathways.    Compression Bandaging  LLE cmpression wraps as established             OT Education - 04/05/19 1252    Education Details  Continued skilled Pt/caregiver education  And LE ADL training throughout visit for lymphedema self care/ home program, including compression wrapping, compression garment and device wear/care, lymphatic pumping ther ex, simple self-MLD, and skin care. Discussed progress towards goals.    Person(s) Educated  Patient    Methods  Explanation;Demonstration;Tactile cues;Verbal cues;Handout    Comprehension  Verbalized understanding;Returned demonstration;Need further instruction;Verbal cues required;Tactile cues required          OT Long Term Goals - 02/23/19 1200      OT LONG TERM GOAL #1  Title  Pt will demonstrate understanding of lymphedema precautions and signs/ symptoms of cellulitisby identifying 6 items for each using printed Lymphedema Workbook for reference PRN (modified independence) to limit LE progression and infection risk.    Baseline  Max A    Time  4    Period  Days    Status  Achieved      OT LONG TERM GOAL #2   Title  Pt will require Max assist from caregiver who will be able to apply multilayer, gradient compression wraps using correct techniques after skilled training to reduce limb volume and limity LE progression.    Baseline  dependent    Time  4    Period  Days    Status  Achieved      OT LONG TERM GOAL #3   Title  Pt will achieve 10% limb  volume reductions  bilaterally from ankle to tibial tuberosity during Intensive Phase CDT to return limbs to typical size and shape ,. reduce infection risk and improve basic andf instrumental ALDs performance.    Baseline  dependent  01/04/19: achieved 12/31/18 for RLE with 26.73% limb volume reduction below the knee.    Time  12    Period  Weeks    Status  Partially Met      OT LONG TERM GOAL #4   Title  Pt will consistently perform all LE self -care home protocols ( simple self MLD, skin care, therapeutic exercise, compression therapies) daily  with max CG assistance to to achieve max edema reduction, to enanble functional improvements,  including fitting preferred street shoes and standard sized clothing, and to achieve increased AROM and reduced falls risk.    Baseline  dependent 01/04/19: Pt unable to reach feet and distal legs to perform self MLD due to AROM limited by body habitus. Pt will benefit from Flexitouch sequential 01/11/19 : Pt reports ~85% compliance. pneumatic compression device ("pump") for long term LE self management.    Period  Weeks    Status  Partially Met      OT LONG TERM GOAL #5   Title  Patient will understand the importance of weight management, increased activity level, and leg elevation for optimal control of limb swelling over time.    Baseline  dependent    Time  12    Period  Weeks    Status  Achieved      OT LONG TERM GOAL #6   Title  During self-management phase of CDT Pt will retain volumetric reduction bilaterally over 6 month visit interval with no greater than 3% upward fluctuation to limit progression of chronic leg swelling and related skin changes, and to reduce infection risk.    Baseline  Max A    Time  6    Period  Months    Status  New            Plan - 04/05/19 1255    Clinical Impression Statement  Pt able to don and doff RLE custom compression garment with modified independence in cclinic today. Although she was successful using  assistive devices, she is satisfied with using her own techniques at h9ome without assistive devices. Pt given internet resources for reference. .Pt tolerated LLE MLD and compression wrapping without difficulty. Cont as per POC.    OT Occupational Profile and History  Comprehensive Assessment- Review of records and extensive additional review of physical, cognitive, psychosocial history related to current functional performance    Occupational performance deficits (  Please refer to evaluation for details):  ADL's;IADL's;Leisure;Social Participation;Rest and Sleep;Other;Work   Engineer, building services, productive activities, role performance   Body Structure / Function / Physical Skills  ADL;Obesity;Decreased knowledge of precautions;Balance;Body mechanics;Decreased knowledge of use of DME;Flexibility;IADL;Pain;Skin integrity;Strength;Edema;Gait;Mobility;ROM    Rehab Potential  Good    Clinical Decision Making  Multiple treatment options, significant modification of task necessary    Comorbidities Affecting Occupational Performance:  Presence of comorbidities impacting occupational performance    Comorbidities impacting occupational performance description:  see SUBJECTIVE    Modification or Assistance to Complete Evaluation   Max significant modification of tasks or assist is necessary to complete    OT Frequency  3x / week    OT Duration  12 weeks   consider reducing to 2 x weekly depending on level of and cinsistency of CG support   OT Treatment/Interventions  Self-care/ADL training;Therapeutic exercise;DME and/or AE instruction;Therapist, nutritional;Compression bandaging;Other (comment);Manual Therapy;Patient/family education;Therapeutic activities;Manual lymph drainage;Energy conservation    Plan  Intensive Phase CDT= Manual lymphatic drainage, (MLD) skin care to improve tissue health, therapeutic exercise (lymphatic pumping ther ex) and gradient compression wrapping one leg at a time with short stretch  compression bandages (knee length in Ms. Grave's case) . Pt edu for LE self care  is ongoing and intensive . Management Pgase CDT is on follow-up PRN basis to support management over time, replacing compression garments, etc.    Recommended Other Services  fit with BLE custom knee high compression stockings, toe caps and convoluted foam HOS devices to limit fibrosis formation ( progression) during HOS    Consulted and Agree with Plan of Care  Patient       Patient will benefit from skilled therapeutic intervention in order to improve the following deficits and impairments:   Body Structure / Function / Physical Skills: ADL, Obesity, Decreased knowledge of precautions, Balance, Body mechanics, Decreased knowledge of use of DME, Flexibility, IADL, Pain, Skin integrity, Strength, Edema, Gait, Mobility, ROM       Visit Diagnosis: Lymphedema, not elsewhere classified    Problem List There are no active problems to display for this patient.  Andrey Spearman, MS, OTR/L, [facility] 04/05/19 1:02 PM    [place] MAIN [facility] SERVICES [address] [place], [place], [address] Phone: [phone]   Fax:  [phone]  Name: LUWANNA BROSSMAN MRN: [id number] Date of Birth: 04/17/1956

## 2019-04-07 ENCOUNTER — Ambulatory Visit: Payer: Medicare Other | Admitting: Occupational Therapy

## 2019-04-07 ENCOUNTER — Other Ambulatory Visit: Payer: Self-pay

## 2019-04-07 DIAGNOSIS — I890 Lymphedema, not elsewhere classified: Secondary | ICD-10-CM | POA: Diagnosis not present

## 2019-04-07 NOTE — Therapy (Signed)
[place] MAIN [facility] SERVICES [address] [place], [place], [address] Phone: [phone]   Fax:  [phone]  Occupational Therapy Treatment  Patient Details  Name: Nancy Riley MRN: [id number] Date of Birth: [birth year] Referring Provider (OT): Delight Stare, MD   Encounter Date: 04/07/2019  OT End of Session - 04/07/19 1400    Visit Number  42    Number of Visits  72    Date for OT Re-Evaluation  06/10/19    OT Start Time  1110    OT Stop Time  1215    OT Time Calculation (min)  65 min    Activity Tolerance  Patient tolerated treatment well;No increased pain   limited by body habitus. Unable to reach feet and lower legs 2/2 girth   Behavior During Therapy  [facility] for tasks assessed/performed       Past Medical History:  Diagnosis Date  . Diabetes mellitus without complication ([place])   . Hypercholesteremia   . Hypertension     Past Surgical History:  Procedure Laterality Date  . BREAST EXCISIONAL BIOPSY Left 1970   negative  . COLONOSCOPY      There were no vitals filed for this visit.  Subjective Assessment - 04/07/19 1356    Subjective   Pt present for OT visit 43/ 72 to address BLE lymphedema. Pt reports knee and back pain is at typical level today. Pt denies leg pain associated with swelling today. Pt reports reports back pain same as last visit, 3/10. Pt reports, per spine Dr   visit, she has discharged celebrex for pain and increased gabapentin and is alternating that with ibupropen.    Pertinent History  Dx relevant to lymphedema (LE): HTN, DM, neuropathy, DJD, arthritis, sciatica,  chronic back pain, morbid obesity, (BMI 50-59.9)    Repetition  Increases Symptoms    Special Tests  strong + Stemmer sign at base of toes bilaterally    Pain Onset  Other (comment)   onset "years ago"                  OT Treatments/Exercises (OP) - 04/07/19 0001      ADLs   ADL Education Given  Yes      Manual Therapy   Manual Therapy  Edema management    Soft tissue mobilization  fibrosis techniques at R medial and lateral ankle    Manual Lymphatic Drainage (MLD)  MLD to LLE using short neck sequence , deep diaphragmatic breathing and functional inguinal pathways.    Compression Bandaging  LLE cmpression wraps as established             OT Education - 04/07/19 1400    Education Details  Continued skilled Pt/caregiver education  And LE ADL training throughout visit for lymphedema self care/ home program, including compression wrapping, compression garment and device wear/care, lymphatic pumping ther ex, simple self-MLD, and skin care. Discussed progress towards goals.    Person(s) Educated  Patient    Methods  Explanation;Demonstration;Tactile cues;Verbal cues;Handout    Comprehension  Verbalized understanding;Returned demonstration;Need further instruction;Verbal cues required;Tactile cues required          OT Long Term Goals - 02/23/19 1200      OT LONG TERM GOAL #1   Title  Pt will demonstrate understanding of lymphedema precautions and signs/ symptoms of cellulitisby identifying 6 items for each using printed Lymphedema Workbook for reference PRN (modified independence) to limit LE progression and infection risk.  Baseline  Max A    Time  4    Period  Days    Status  Achieved      OT LONG TERM GOAL #2   Title  Pt will require Max assist from caregiver who will be able to apply multilayer, gradient compression wraps using correct techniques after skilled training to reduce limb volume and limity LE progression.    Baseline  dependent    Time  4    Period  Days    Status  Achieved      OT LONG TERM GOAL #3   Title  Pt will achieve 10% limb volume reductions  bilaterally from ankle to tibial tuberosity during Intensive Phase CDT to return limbs to typical size and shape ,. reduce infection risk and improve basic andf instrumental ALDs performance.    Baseline  dependent  01/04/19: achieved  12/31/18 for RLE with 26.73% limb volume reduction below the knee.    Time  12    Period  Weeks    Status  Partially Met      OT LONG TERM GOAL #4   Title  Pt will consistently perform all LE self -care home protocols ( simple self MLD, skin care, therapeutic exercise, compression therapies) daily  with max CG assistance to to achieve max edema reduction, to enanble functional improvements,  including fitting preferred street shoes and standard sized clothing, and to achieve increased AROM and reduced falls risk.    Baseline  dependent 01/04/19: Pt unable to reach feet and distal legs to perform self MLD due to AROM limited by body habitus. Pt will benefit from Flexitouch sequential 01/11/19 : Pt reports ~85% compliance. pneumatic compression device ("pump") for long term LE self management.    Period  Weeks    Status  Partially Met      OT LONG TERM GOAL #5   Title  Patient will understand the importance of weight management, increased activity level, and leg elevation for optimal control of limb swelling over time.    Baseline  dependent    Time  12    Period  Weeks    Status  Achieved      OT LONG TERM GOAL #6   Title  During self-management phase of CDT Pt will retain volumetric reduction bilaterally over 6 month visit interval with no greater than 3% upward fluctuation to limit progression of chronic leg swelling and related skin changes, and to reduce infection risk.    Baseline  Max A    Time  6    Period  Months    Status  New            Plan - 04/07/19 1401    Clinical Impression Statement  Pt tolerated MLD, skin care, self care training and compression wraps to LLE today without difficulty. Leg pain associated with swelling has resolved bilaterally by report. Pt is using sequential device between sessions and feels it is beneficial. Cont as per POC. Check LLE limb volumes later this week.    OT Occupational Profile and History  Comprehensive Assessment- Review of records and  extensive additional review of physical, cognitive, psychosocial history related to current functional performance    Occupational performance deficits (Please refer to evaluation for details):  ADL's;IADL's;Leisure;Social Participation;Rest and Sleep;Other;Work   Engineer, building services, productive activities, role performance   Body Structure / Function / Physical Skills  ADL;Obesity;Decreased knowledge of precautions;Balance;Body mechanics;Decreased knowledge of use of DME;Flexibility;IADL;Pain;Skin integrity;Strength;Edema;Gait;Mobility;ROM    Rehab  Potential  Good    Clinical Decision Making  Multiple treatment options, significant modification of task necessary    Comorbidities Affecting Occupational Performance:  Presence of comorbidities impacting occupational performance    Comorbidities impacting occupational performance description:  see SUBJECTIVE    Modification or Assistance to Complete Evaluation   Max significant modification of tasks or assist is necessary to complete    OT Frequency  3x / week    OT Duration  12 weeks   consider reducing to 2 x weekly depending on level of and cinsistency of CG support   OT Treatment/Interventions  Self-care/ADL training;Therapeutic exercise;DME and/or AE instruction;Therapist, nutritional;Compression bandaging;Other (comment);Manual Therapy;Patient/family education;Therapeutic activities;Manual lymph drainage;Energy conservation    Plan  Intensive Phase CDT= Manual lymphatic drainage, (MLD) skin care to improve tissue health, therapeutic exercise (lymphatic pumping ther ex) and gradient compression wrapping one leg at a time with short stretch compression bandages (knee length in Ms. Grave's case) . Pt edu for LE self care  is ongoing and intensive . Management Pgase CDT is on follow-up PRN basis to support management over time, replacing compression garments, etc.    Recommended Other Services  fit with BLE custom knee high compression stockings, toe caps  and convoluted foam HOS devices to limit fibrosis formation ( progression) during HOS    Consulted and Agree with Plan of Care  Patient       Patient will benefit from skilled therapeutic intervention in order to improve the following deficits and impairments:   Body Structure / Function / Physical Skills: ADL, Obesity, Decreased knowledge of precautions, Balance, Body mechanics, Decreased knowledge of use of DME, Flexibility, IADL, Pain, Skin integrity, Strength, Edema, Gait, Mobility, ROM       Visit Diagnosis: Lymphedema, not elsewhere classified    Problem List There are no active problems to display for this patient.   Andrey Spearman, MS, OTR/L, [facility] 04/07/19 4:11 PM   [place] MAIN [facility] SERVICES [address] [place], [place], [address] Phone: [phone]   Fax:  [phone]  Name: MERTIE HASLEM MRN: [id number] Date of Birth: Jul 24, 1955

## 2019-04-12 ENCOUNTER — Other Ambulatory Visit: Payer: Self-pay

## 2019-04-12 ENCOUNTER — Ambulatory Visit: Payer: Medicare Other | Admitting: Occupational Therapy

## 2019-04-12 DIAGNOSIS — I890 Lymphedema, not elsewhere classified: Secondary | ICD-10-CM

## 2019-04-12 NOTE — Therapy (Signed)
[place] MAIN [facility] SERVICES [address] [place], [place], [address] Phone: [phone]   Fax:  [phone]  Occupational Therapy Treatment  Patient Details  Name: CATELYN FRIEL MRN: [id number] Date of Birth: 07-20-1955 Referring Provider (OT): Delight Stare, MD   Encounter Date: 04/12/2019  OT End of Session - 04/12/19 1318    Visit Number  43    Number of Visits  72    Date for OT Re-Evaluation  06/10/19    OT Start Time  1005    OT Stop Time  1105    OT Time Calculation (min)  60 min    Activity Tolerance  Patient tolerated treatment well;No increased pain   limited by body habitus. Unable to reach feet and lower legs 2/2 girth   Behavior During Therapy  [employer] for tasks assessed/performed       Past Medical History:  Diagnosis Date  . Diabetes mellitus without complication ([place])   . Hypercholesteremia   . Hypertension     Past Surgical History:  Procedure Laterality Date  . BREAST EXCISIONAL BIOPSY Left 1970   negative  . COLONOSCOPY      There were no vitals filed for this visit.  Subjective Assessment - 04/12/19 1019    Subjective   Pt present for OT visit 44/ 72 to address BLE lymphedema. Pt reports 5/10 back and knee pain 6/10. Pt also reporting arthritis pain in R foot of 3/10. No other new concerns or complaints today re lymphedema.    Pertinent History  Dx relevant to lymphedema (LE): HTN, DM, neuropathy, DJD, arthritis, sciatica,  chronic back pain, morbid obesity, (BMI 50-59.9)    Repetition  Increases Symptoms    Special Tests  strong + Stemmer sign at base of toes bilaterally    Pain Onset  Other (comment)   onset "years ago"                  OT Treatments/Exercises (OP) - 04/12/19 0001      ADLs   ADL Education Given  Yes      Manual Therapy   Manual Therapy  Edema management    Soft tissue mobilization  fibrosis techniques at R medial and lateral ankle    Manual Lymphatic Drainage (MLD)   MLD to LLE using short neck sequence , deep diaphragmatic breathing and functional inguinal pathways.    Compression Bandaging  LLE cmpression wraps as established             OT Education - 04/12/19 1021    Education Details  Continued skilled Pt/caregiver education  And LE ADL training throughout visit for lymphedema self care/ home program, including compression wrapping, compression garment and device wear/care, lymphatic pumping ther ex, simple self-MLD, and skin care. Discussed progress towards goals.    Person(s) Educated  Patient    Methods  Explanation;Demonstration;Tactile cues;Verbal cues;Handout    Comprehension  Verbalized understanding;Returned demonstration;Need further instruction;Verbal cues required;Tactile cues required          OT Long Term Goals - 02/23/19 1200      OT LONG TERM GOAL #1   Title  Pt will demonstrate understanding of lymphedema precautions and signs/ symptoms of cellulitisby identifying 6 items for each using printed Lymphedema Workbook for reference PRN (modified independence) to limit LE progression and infection risk.    Baseline  Max A    Time  4    Period  Days    Status  Achieved  OT LONG TERM GOAL #2   Title  Pt will require Max assist from caregiver who will be able to apply multilayer, gradient compression wraps using correct techniques after skilled training to reduce limb volume and limity LE progression.    Baseline  dependent    Time  4    Period  Days    Status  Achieved      OT LONG TERM GOAL #3   Title  Pt will achieve 10% limb volume reductions  bilaterally from ankle to tibial tuberosity during Intensive Phase CDT to return limbs to typical size and shape ,. reduce infection risk and improve basic andf instrumental ALDs performance.    Baseline  dependent  01/04/19: achieved 12/31/18 for RLE with 26.73% limb volume reduction below the knee.    Time  12    Period  Weeks    Status  Partially Met      OT LONG TERM GOAL  #4   Title  Pt will consistently perform all LE self -care home protocols ( simple self MLD, skin care, therapeutic exercise, compression therapies) daily  with max CG assistance to to achieve max edema reduction, to enanble functional improvements,  including fitting preferred street shoes and standard sized clothing, and to achieve increased AROM and reduced falls risk.    Baseline  dependent 01/04/19: Pt unable to reach feet and distal legs to perform self MLD due to AROM limited by body habitus. Pt will benefit from Flexitouch sequential 01/11/19 : Pt reports ~85% compliance. pneumatic compression device ("pump") for long term LE self management.    Period  Weeks    Status  Partially Met      OT LONG TERM GOAL #5   Title  Patient will understand the importance of weight management, increased activity level, and leg elevation for optimal control of limb swelling over time.    Baseline  dependent    Time  12    Period  Weeks    Status  Achieved      OT LONG TERM GOAL #6   Title  During self-management phase of CDT Pt will retain volumetric reduction bilaterally over 6 month visit interval with no greater than 3% upward fluctuation to limit progression of chronic leg swelling and related skin changes, and to reduce infection risk.    Baseline  Max A    Time  6    Period  Months    Status  New            Plan - 04/12/19 1319    Clinical Impression Statement  LLE limb volume continues to reduce somewht more slowly than RLE. Pt reports mild soreness in L leg as fluid congestion continues to decrease at distal leg .Pt tolerated MLD and compression wrapping to LLE today without difficulty. Cont as per POC. Consider performing LLE limb   volumetrics at next visit  as we move closer to completing custom LLE compression garment measurements.    OT Occupational Profile and History  Comprehensive Assessment- Review of records and extensive additional review of physical, cognitive, psychosocial  history related to current functional performance    Occupational performance deficits (Please refer to evaluation for details):  ADL's;IADL's;Leisure;Social Participation;Rest and Sleep;Other;Work   body image, productive activities, role performance   Body Structure / Function / Physical Skills  ADL;Obesity;Decreased knowledge of precautions;Balance;Body mechanics;Decreased knowledge of use of DME;Flexibility;IADL;Pain;Skin integrity;Strength;Edema;Gait;Mobility;ROM    Rehab Potential  Good    Clinical Decision Making  Multiple treatment   options, significant modification of task necessary    Comorbidities Affecting Occupational Performance:  Presence of comorbidities impacting occupational performance    Comorbidities impacting occupational performance description:  see SUBJECTIVE    Modification or Assistance to Complete Evaluation   Max significant modification of tasks or assist is necessary to complete    OT Frequency  3x / week    OT Duration  12 weeks   consider reducing to 2 x weekly depending on level of and cinsistency of CG support   OT Treatment/Interventions  Self-care/ADL training;Therapeutic exercise;DME and/or AE instruction;Functional Mobility Training;Compression bandaging;Other (comment);Manual Therapy;Patient/family education;Therapeutic activities;Manual lymph drainage;Energy conservation    Plan  Intensive Phase CDT= Manual lymphatic drainage, (MLD) skin care to improve tissue health, therapeutic exercise (lymphatic pumping ther ex) and gradient compression wrapping one leg at a time with short stretch compression bandages (knee length in Ms. Grave's case) . Pt edu for LE self care  is ongoing and intensive . Management Pgase CDT is on follow-up PRN basis to support management over time, replacing compression garments, etc.    Recommended Other Services  fit with BLE custom knee high compression stockings, toe caps and convoluted foam HOS devices to limit fibrosis formation (  progression) during HOS    Consulted and Agree with Plan of Care  Patient       Patient will benefit from skilled therapeutic intervention in order to improve the following deficits and impairments:   Body Structure / Function / Physical Skills: ADL, Obesity, Decreased knowledge of precautions, Balance, Body mechanics, Decreased knowledge of use of DME, Flexibility, IADL, Pain, Skin integrity, Strength, Edema, Gait, Mobility, ROM       Visit Diagnosis: Lymphedema, not elsewhere classified    Problem List There are no active problems to display for this patient.  Theresa Gilliam, MS, OTR/L, CLT-LANA 04/12/19 3:24 PM    [place] [facility] MAIN REHAB SERVICES [address] [place], [place], [address] Phone: [phone]   Fax:  [phone]  Name: Rilda B Finnicum MRN: [id number] Date of Birth: 06/25/1955 

## 2019-04-14 ENCOUNTER — Other Ambulatory Visit: Payer: Self-pay

## 2019-04-14 ENCOUNTER — Ambulatory Visit: Payer: Medicare Other | Admitting: Occupational Therapy

## 2019-04-14 DIAGNOSIS — I890 Lymphedema, not elsewhere classified: Secondary | ICD-10-CM

## 2019-04-14 NOTE — Therapy (Signed)
[place] MAIN [facility] SERVICES [address] [place], [place], [address] Phone: [phone]   Fax:  [phone]  Occupational Therapy Treatment  Patient Details  Name: Nancy Riley MRN: [id number] Date of Birth: 1956-01-05 Referring Provider (OT): Delight Stare, MD   Encounter Date: 04/14/2019  OT End of Session - 04/14/19 1314    Visit Number  44    Number of Visits  72    Date for OT Re-Evaluation  06/10/19    OT Start Time  1006    OT Stop Time  1115    OT Time Calculation (min)  69 min    Activity Tolerance  Patient tolerated treatment well;No increased pain   limited by body habitus. Unable to reach feet and lower legs 2/2 girth   Behavior During Therapy  [facility] for tasks assessed/performed       Past Medical History:  Diagnosis Date  . Diabetes mellitus without complication ([place])   . Hypercholesteremia   . Hypertension     Past Surgical History:  Procedure Laterality Date  . BREAST EXCISIONAL BIOPSY Left 1970   negative  . COLONOSCOPY      There were no vitals filed for this visit.  Subjective Assessment - 04/14/19 1014    Subjective   Pt present for OT visit 45/ 72 to address BLE lymphedema. Pt reports 5/10 back and knee pain 6/10 again today. Pt has no other complaints. She presents with LLE knee length compression wraps in place and brings clean set rolled and ready to wrap.    Pertinent History  Dx relevant to lymphedema (LE): HTN, DM, neuropathy, DJD, arthritis, sciatica,  chronic back pain, morbid obesity, (BMI 50-59.9)    Repetition  Increases Symptoms    Special Tests  strong + Stemmer sign at base of toes bilaterally    Pain Onset  Other (comment)   onset "years ago"         LYMPHEDEMA/ONCOLOGY QUESTIONNAIRE - 04/14/19 1309      Left Lower Extremity Lymphedema   Other  LLE limb volume from ankle to tibial tuberosity (A-D) measures 6618.69 ml.    Other  LLE A-D limb volume is decreased by 22.30% since  initially measured on 12/08/18    Other  LVD measures 30,62%, L>R,  which is increased from 14.34%, R>L since commencing CDT.               OT Treatments/Exercises (OP) - 04/14/19 0001      ADLs   ADL Education Given  Yes      Manual Therapy   Manual Therapy  Edema management    Manual therapy comments  LLE comparative limb volumetrics    Soft tissue mobilization  fibrosis techniques at R medial and lateral ankle    Manual Lymphatic Drainage (MLD)  MLD to LLE using short neck sequence , deep diaphragmatic breathing and functional inguinal pathways.    Compression Bandaging  LLE cmpression wraps as established             OT Education - 04/14/19 1313    Education Details  Reviewed results for LLE limb volumetrics, including LLE and RLE reductions to date, and dramatic change in Limb volume differential at present from inital measurement. Good return    Person(s) Educated  Patient    Methods  Explanation;Demonstration;Tactile cues;Verbal cues;Handout    Comprehension  Verbalized understanding;Returned demonstration;Need further instruction;Verbal cues required;Tactile cues required          OT  Long Term Goals - 02/23/19 1200      OT LONG TERM GOAL #1   Title  Pt will demonstrate understanding of lymphedema precautions and signs/ symptoms of cellulitisby identifying 6 items for each using printed Lymphedema Workbook for reference PRN (modified independence) to limit LE progression and infection risk.    Baseline  Max A    Time  4    Period  Days    Status  Achieved      OT LONG TERM GOAL #2   Title  Pt will require Max assist from caregiver who will be able to apply multilayer, gradient compression wraps using correct techniques after skilled training to reduce limb volume and limity LE progression.    Baseline  dependent    Time  4    Period  Days    Status  Achieved      OT LONG TERM GOAL #3   Title  Pt will achieve 10% limb volume reductions  bilaterally from  ankle to tibial tuberosity during Intensive Phase CDT to return limbs to typical size and shape ,. reduce infection risk and improve basic andf instrumental ALDs performance.    Baseline  dependent  01/04/19: achieved 12/31/18 for RLE with 26.73% limb volume reduction below the knee.    Time  12    Period  Weeks    Status  Partially Met      OT LONG TERM GOAL #4   Title  Pt will consistently perform all LE self -care home protocols ( simple self MLD, skin care, therapeutic exercise, compression therapies) daily  with max CG assistance to to achieve max edema reduction, to enanble functional improvements,  including fitting preferred street shoes and standard sized clothing, and to achieve increased AROM and reduced falls risk.    Baseline  dependent 01/04/19: Pt unable to reach feet and distal legs to perform self MLD due to AROM limited by body habitus. Pt will benefit from Flexitouch sequential 01/11/19 : Pt reports ~85% compliance. pneumatic compression device ("pump") for long term LE self management.    Period  Weeks    Status  Partially Met      OT LONG TERM GOAL #5   Title  Patient will understand the importance of weight management, increased activity level, and leg elevation for optimal control of limb swelling over time.    Baseline  dependent    Time  12    Period  Weeks    Status  Achieved      OT LONG TERM GOAL #6   Title  During self-management phase of CDT Pt will retain volumetric reduction bilaterally over 6 month visit interval with no greater than 3% upward fluctuation to limit progression of chronic leg swelling and related skin changes, and to reduce infection risk.    Baseline  Max A    Time  6    Period  Months    Status  New            Plan - 04/14/19 1315    Clinical Impression Statement  Completed LLE comparative limb volumetrics today. LLE A-D limb volume is decreased by 22.30% since initially measured on 12/08/18. This value meets and exceeds the 10% goal by 2 x.  LVD measures 30,62%, L>R,  which is increased from 14.34%, R>L since commencing CDT. Pt is diligent w/ LE self care between OT sessions. She continues to use sequential device daily on alternating legs. We'll cont w POC 2 x week  frequency in effort to further reduce LLE to close symmetry with RLE.    OT Occupational Profile and History  Comprehensive Assessment- Review of records and extensive additional review of physical, cognitive, psychosocial history related to current functional performance    Occupational performance deficits (Please refer to evaluation for details):  ADL's;IADL's;Leisure;Social Participation;Rest and Sleep;Other;Work   Engineer, building services, productive activities, role performance   Body Structure / Function / Physical Skills  ADL;Obesity;Decreased knowledge of precautions;Balance;Body mechanics;Decreased knowledge of use of DME;Flexibility;IADL;Pain;Skin integrity;Strength;Edema;Gait;Mobility;ROM    Rehab Potential  Good    Clinical Decision Making  Multiple treatment options, significant modification of task necessary    Comorbidities Affecting Occupational Performance:  Presence of comorbidities impacting occupational performance    Comorbidities impacting occupational performance description:  see SUBJECTIVE    Modification or Assistance to Complete Evaluation   Max significant modification of tasks or assist is necessary to complete    OT Frequency  3x / week    OT Duration  12 weeks   consider reducing to 2 x weekly depending on level of and cinsistency of CG support   OT Treatment/Interventions  Self-care/ADL training;Therapeutic exercise;DME and/or AE instruction;Therapist, nutritional;Compression bandaging;Other (comment);Manual Therapy;Patient/family education;Therapeutic activities;Manual lymph drainage;Energy conservation    Plan  Intensive Phase CDT= Manual lymphatic drainage, (MLD) skin care to improve tissue health, therapeutic exercise (lymphatic pumping ther ex) and  gradient compression wrapping one leg at a time with short stretch compression bandages (knee length in Ms. Grave's case) . Pt edu for LE self care  is ongoing and intensive . Management Pgase CDT is on follow-up PRN basis to support management over time, replacing compression garments, etc.    Recommended Other Services  fit with BLE custom knee high compression stockings, toe caps and convoluted foam HOS devices to limit fibrosis formation ( progression) during HOS    Consulted and Agree with Plan of Care  Patient       Patient will benefit from skilled therapeutic intervention in order to improve the following deficits and impairments:   Body Structure / Function / Physical Skills: ADL, Obesity, Decreased knowledge of precautions, Balance, Body mechanics, Decreased knowledge of use of DME, Flexibility, IADL, Pain, Skin integrity, Strength, Edema, Gait, Mobility, ROM       Visit Diagnosis: Lymphedema, not elsewhere classified    Problem List There are no active problems to display for this patient.   Andrey Spearman, MS, OTR/L, [facility] 04/14/19 1:23 PM  [employer] MAIN [facility] SERVICES [address] [place], [place], [address] Phone: [phone]   Fax:  [phone]  Name: Nancy Riley MRN: [id number] Date of Birth: 02/13/1956

## 2019-04-22 ENCOUNTER — Ambulatory Visit: Payer: Medicare Other | Admitting: Occupational Therapy

## 2019-04-22 ENCOUNTER — Other Ambulatory Visit: Payer: Self-pay

## 2019-04-22 DIAGNOSIS — I890 Lymphedema, not elsewhere classified: Secondary | ICD-10-CM

## 2019-04-22 NOTE — Therapy (Signed)
[place] MAIN [facility] SERVICES [address] [place], [place], [address] Phone: [phone]   Fax:  [phone]  Occupational Therapy Treatment  Patient Details  Name: Nancy Riley MRN: [id number] Date of Birth: 25-Feb-1956 Referring Provider (OT): Delight Stare, MD   Encounter Date: 04/22/2019  OT End of Session - 04/22/19 1456    Visit Number  45    Number of Visits  72    Date for OT Re-Evaluation  06/10/19    OT Start Time  1105    OT Stop Time  5643    OT Time Calculation (min)  60 min    Activity Tolerance  Patient tolerated treatment well;No increased pain   limited by body habitus. Unable to reach feet and lower legs 2/2 girth   Behavior During Therapy  [facility] for tasks assessed/performed       Past Medical History:  Diagnosis Date  . Diabetes mellitus without complication ([place])   . Hypercholesteremia   . Hypertension     Past Surgical History:  Procedure Laterality Date  . BREAST EXCISIONAL BIOPSY Left 1970   negative  . COLONOSCOPY      There were no vitals filed for this visit.  Subjective Assessment - 04/22/19 1447    Subjective   Pt present for OT visit 46/ 72 to address BLE lymphedema. Pt reports 5/10 back and knee pain 6/10 again today. Pt brings remade RLE knee length compression garment to clinic for fitting. Pt reports she had MRI for her back during visit interval and he is concerned about a possible infection in the lumbar spine. Pt reports she will undergo a 2nd MRI with contrast at an upcoming appointment.    Pertinent History  Dx relevant to lymphedema (LE): HTN, DM, neuropathy, DJD, arthritis, sciatica,  chronic back pain, morbid obesity, (BMI 50-59.9)    Repetition  Increases Symptoms    Special Tests  strong + Stemmer sign at base of toes bilaterally    Pain Onset  Other (comment)   onset "years ago"                  OT Treatments/Exercises (OP) - 04/22/19 0001      ADLs   ADL Education  Given  Yes      Manual Therapy   Manual Therapy  Edema management    Edema Management  anatomical measurements for custom  RLE compression garments    Compression Bandaging  LLE cmpression wraps as established             OT Education - 04/22/19 1453    Education Details  Pt edu for custom compression garment recommendations , measurements and fitting process.    Person(s) Educated  Patient    Methods  Explanation;Demonstration;Tactile cues;Verbal cues;Handout    Comprehension  Verbalized understanding;Returned demonstration;Need further instruction;Verbal cues required;Tactile cues required          OT Long Term Goals - 02/23/19 1200      OT LONG TERM GOAL #1   Title  Pt will demonstrate understanding of lymphedema precautions and signs/ symptoms of cellulitisby identifying 6 items for each using printed Lymphedema Workbook for reference PRN (modified independence) to limit LE progression and infection risk.    Baseline  Max A    Time  4    Period  Days    Status  Achieved      OT LONG TERM GOAL #2   Title  Pt will require Max assist  from caregiver who will be able to apply multilayer, gradient compression wraps using correct techniques after skilled training to reduce limb volume and limity LE progression.    Baseline  dependent    Time  4    Period  Days    Status  Achieved      OT LONG TERM GOAL #3   Title  Pt will achieve 10% limb volume reductions  bilaterally from ankle to tibial tuberosity during Intensive Phase CDT to return limbs to typical size and shape ,. reduce infection risk and improve basic andf instrumental ALDs performance.    Baseline  dependent  01/04/19: achieved 12/31/18 for RLE with 26.73% limb volume reduction below the knee.    Time  12    Period  Weeks    Status  Partially Met      OT LONG TERM GOAL #4   Title  Pt will consistently perform all LE self -care home protocols ( simple self MLD, skin care, therapeutic exercise, compression  therapies) daily  with max CG assistance to to achieve max edema reduction, to enanble functional improvements,  including fitting preferred street shoes and standard sized clothing, and to achieve increased AROM and reduced falls risk.    Baseline  dependent 01/04/19: Pt unable to reach feet and distal legs to perform self MLD due to AROM limited by body habitus. Pt will benefit from Flexitouch sequential 01/11/19 : Pt reports ~85% compliance. pneumatic compression device ("pump") for long term LE self management.    Period  Weeks    Status  Partially Met      OT LONG TERM GOAL #5   Title  Patient will understand the importance of weight management, increased activity level, and leg elevation for optimal control of limb swelling over time.    Baseline  dependent    Time  12    Period  Weeks    Status  Achieved      OT LONG TERM GOAL #6   Title  During self-management phase of CDT Pt will retain volumetric reduction bilaterally over 6 month visit interval with no greater than 3% upward fluctuation to limit progression of chronic leg swelling and related skin changes, and to reduce infection risk.    Baseline  Max A    Time  6    Period  Months    Status  New            Plan - 04/22/19 1457    Clinical Impression Statement  Remade RLE knee high compression garment fits perfectly. Pt able to tolerate without discomfort. Completed anatomical measurements for custom LLE garment. Applied compression wraps to RLE as established after skin care with low ph Eucerine lotion. Cont as per POC.    OT Occupational Profile and History  Comprehensive Assessment- Review of records and extensive additional review of physical, cognitive, psychosocial history related to current functional performance    Occupational performance deficits (Please refer to evaluation for details):  ADL's;IADL's;Leisure;Social Participation;Rest and Sleep;Other;Work   Engineer, building services, productive activities, role performance   Body  Structure / Function / Physical Skills  ADL;Obesity;Decreased knowledge of precautions;Balance;Body mechanics;Decreased knowledge of use of DME;Flexibility;IADL;Pain;Skin integrity;Strength;Edema;Gait;Mobility;ROM    Rehab Potential  Good    Clinical Decision Making  Multiple treatment options, significant modification of task necessary    Comorbidities Affecting Occupational Performance:  Presence of comorbidities impacting occupational performance    Comorbidities impacting occupational performance description:  see SUBJECTIVE    Modification or Assistance to  Complete Evaluation   Max significant modification of tasks or assist is necessary to complete    OT Frequency  3x / week    OT Duration  12 weeks   consider reducing to 2 x weekly depending on level of and cinsistency of CG support   OT Treatment/Interventions  Self-care/ADL training;Therapeutic exercise;DME and/or AE instruction;Therapist, nutritional;Compression bandaging;Other (comment);Manual Therapy;Patient/family education;Therapeutic activities;Manual lymph drainage;Energy conservation    Plan  Intensive Phase CDT= Manual lymphatic drainage, (MLD) skin care to improve tissue health, therapeutic exercise (lymphatic pumping ther ex) and gradient compression wrapping one leg at a time with short stretch compression bandages (knee length in Ms. Grave's case) . Pt edu for LE self care  is ongoing and intensive . Management Pgase CDT is on follow-up PRN basis to support management over time, replacing compression garments, etc.    Recommended Other Services  fit with BLE custom knee high compression stockings, toe caps and convoluted foam HOS devices to limit fibrosis formation ( progression) during HOS    Consulted and Agree with Plan of Care  Patient       Patient will benefit from skilled therapeutic intervention in order to improve the following deficits and impairments:   Body Structure / Function / Physical Skills: ADL, Obesity,  Decreased knowledge of precautions, Balance, Body mechanics, Decreased knowledge of use of DME, Flexibility, IADL, Pain, Skin integrity, Strength, Edema, Gait, Mobility, ROM       Visit Diagnosis: Lymphedema, not elsewhere classified    Problem List There are no active problems to display for this patient.   Andrey Spearman, MS, OTR/L, [facility] 04/22/19 3:03 PM   [place] MAIN [facility] SERVICES [address] [place], [place], [address] Phone: [phone]   Fax:  [phone]  Name: KARILYNN CARRANZA MRN: [id number] Date of Birth: 23-Sep-1955

## 2019-04-26 ENCOUNTER — Ambulatory Visit: Payer: Medicare Other | Admitting: Occupational Therapy

## 2019-04-26 ENCOUNTER — Other Ambulatory Visit: Payer: Self-pay

## 2019-04-26 DIAGNOSIS — I890 Lymphedema, not elsewhere classified: Secondary | ICD-10-CM

## 2019-04-26 DIAGNOSIS — M4640 Discitis, unspecified, site unspecified: Secondary | ICD-10-CM | POA: Insufficient documentation

## 2019-04-26 NOTE — Therapy (Signed)
[place] MAIN [facility] SERVICES [address] [place], [place], [address] Phone: [phone]   Fax:  [phone]  Occupational Therapy Treatment  Patient Details  Name: Nancy Riley MRN: [id number] Date of Birth: [birth year] Referring Provider (OT): Delight Stare, MD   Encounter Date: 04/26/2019  OT End of Session - 04/26/19 1519    Visit Number  46    Number of Visits  72    Date for OT Re-Evaluation  06/10/19    OT Start Time  1005    OT Stop Time  1105    OT Time Calculation (min)  60 min    Activity Tolerance  Patient tolerated treatment well;No increased pain   limited by body habitus. Unable to reach feet and lower legs 2/2 girth   Behavior During Therapy  [facility] for tasks assessed/performed       Past Medical History:  Diagnosis Date  . Diabetes mellitus without complication ([place])   . Hypercholesteremia   . Hypertension     Past Surgical History:  Procedure Laterality Date  . BREAST EXCISIONAL BIOPSY Left 1970   negative  . COLONOSCOPY      There were no vitals filed for this visit.  Subjective Assessment - 04/26/19 1016    Subjective   Pt present for OT visit 47/ 72 to address BLE lymphedema. Pt reports 5/10 back and knee pain 10/10 again today. Pt presents with LLE knee length compression wraps in place.    Pertinent History  Dx relevant to lymphedema (Nancy): HTN, DM, neuropathy, DJD, arthritis, sciatica,  chronic back pain, morbid obesity, (BMI 50-59.9)    Repetition  Increases Symptoms    Special Tests  strong + Stemmer sign at base of toes bilaterally    Pain Onset  Other (comment)   onset "years ago"                          OT Education - 04/26/19 1519    Education Details  Continued skilled Pt/caregiver education  And Nancy ADL training throughout visit for lymphedema self care/ home program, including compression wrapping, compression garment and device wear/care, lymphatic pumping ther ex,  simple self-MLD, and skin care. Discussed progress towards goals.    Person(s) Educated  Patient    Methods  Explanation;Demonstration;Tactile cues;Verbal cues;Handout    Comprehension  Verbalized understanding;Returned demonstration;Need further instruction;Verbal cues required;Tactile cues required          OT Long Term Goals - 02/23/19 1200      OT LONG TERM GOAL #1   Title  Pt will demonstrate understanding of lymphedema precautions and signs/ symptoms of cellulitisby identifying 6 items for each using printed Lymphedema Workbook for reference PRN (modified independence) to limit Nancy progression and infection risk.    Baseline  Max A    Time  4    Period  Days    Status  Achieved      OT LONG TERM GOAL #2   Title  Pt will require Max assist from caregiver who will be able to apply multilayer, gradient compression wraps using correct techniques after skilled training to reduce limb volume and limity Nancy progression.    Baseline  dependent    Time  4    Period  Days    Status  Achieved      OT LONG TERM GOAL #3   Title  Pt will achieve 10% limb volume reductions  bilaterally from ankle  to tibial tuberosity during Intensive Phase CDT to return limbs to typical size and shape ,. reduce infection risk and improve basic andf instrumental ALDs performance.    Baseline  dependent  01/04/19: achieved 12/31/18 for RLE with 26.73% limb volume reduction below the knee.    Time  12    Period  Weeks    Status  Partially Met      OT LONG TERM GOAL #4   Title  Pt will consistently perform all Nancy self -care home protocols ( simple self MLD, skin care, therapeutic exercise, compression therapies) daily  with max CG assistance to to achieve max edema reduction, to enanble functional improvements,  including fitting preferred street shoes and standard sized clothing, and to achieve increased AROM and reduced falls risk.    Baseline  dependent 01/04/19: Pt unable to reach feet and distal legs to perform  self MLD due to AROM limited by body habitus. Pt will benefit from Flexitouch sequential 01/11/19 : Pt reports ~85% compliance. pneumatic compression device ("pump") for long term Nancy self management.    Period  Weeks    Status  Partially Met      OT LONG TERM GOAL #5   Title  Patient will understand the importance of weight management, increased activity level, and leg elevation for optimal control of limb swelling over time.    Baseline  dependent    Time  12    Period  Weeks    Status  Achieved      OT LONG TERM GOAL #6   Title  During self-management phase of CDT Pt will retain volumetric reduction bilaterally over 6 month visit interval with no greater than 3% upward fluctuation to limit progression of chronic leg swelling and related skin changes, and to reduce infection risk.    Baseline  Max A    Time  6    Period  Months    Status  New            Plan - 04/26/19 1520    Clinical Impression Statement  Pt c/o increasing weakness in her legs recently. Pt tolerated MLD, skin care, self care training and compression wraps to LLE today without difficulty. At end of session Pt called by spine MD's office and sent to ER     due to suspected spine infection. Cont as per POC.    OT Occupational Profile and History  Comprehensive Assessment- Review of records and extensive additional review of physical, cognitive, psychosocial history related to current functional performance    Occupational performance deficits (Please refer to evaluation for details):  ADL's;IADL's;Leisure;Social Participation;Rest and Sleep;Other;Work   Engineer, building services, productive activities, role performance   Body Structure / Function / Physical Skills  ADL;Obesity;Decreased knowledge of precautions;Balance;Body mechanics;Decreased knowledge of use of DME;Flexibility;IADL;Pain;Skin integrity;Strength;Edema;Gait;Mobility;ROM    Rehab Potential  Good    Clinical Decision Making  Multiple treatment options, significant  modification of task necessary    Comorbidities Affecting Occupational Performance:  Presence of comorbidities impacting occupational performance    Comorbidities impacting occupational performance description:  see SUBJECTIVE    Modification or Assistance to Complete Evaluation   Max significant modification of tasks or assist is necessary to complete    OT Frequency  3x / week    OT Duration  12 weeks   consider reducing to 2 x weekly depending on level of and cinsistency of CG support   OT Treatment/Interventions  Self-care/ADL training;Therapeutic exercise;DME and/or AE instruction;Therapist, nutritional;Compression bandaging;Other (comment);Manual  Therapy;Patient/family education;Therapeutic activities;Manual lymph drainage;Energy conservation    Plan  Intensive Phase CDT= Manual lymphatic drainage, (MLD) skin care to improve tissue health, therapeutic exercise (lymphatic pumping ther ex) and gradient compression wrapping one leg at a time with short stretch compression bandages (knee length in Ms. Grave's case) . Pt edu for Nancy self care  is ongoing and intensive . Management Pgase CDT is on follow-up PRN basis to support management over time, replacing compression garments, etc.    Recommended Other Services  fit with BLE custom knee high compression stockings, toe caps and convoluted foam HOS devices to limit fibrosis formation ( progression) during HOS    Consulted and Agree with Plan of Care  Patient       Patient will benefit from skilled therapeutic intervention in order to improve the following deficits and impairments:   Body Structure / Function / Physical Skills: ADL, Obesity, Decreased knowledge of precautions, Balance, Body mechanics, Decreased knowledge of use of DME, Flexibility, IADL, Pain, Skin integrity, Strength, Edema, Gait, Mobility, ROM       Visit Diagnosis: Lymphedema, not elsewhere classified    Problem List There are no active problems to display for this  patient.   Andrey Spearman, MS, OTR/L, [facility] 04/26/19 3:23 PM  [place] MAIN [facility] SERVICES [address] [place], [place], [address] Phone: [phone]   Fax:  [phone]  Name: Nancy Riley MRN: [id number] Date of Birth: [birth year]

## 2019-04-28 ENCOUNTER — Ambulatory Visit: Payer: Medicare Other | Admitting: Occupational Therapy

## 2019-05-04 ENCOUNTER — Ambulatory Visit: Payer: Medicare Other | Admitting: Occupational Therapy

## 2019-05-06 ENCOUNTER — Encounter: Payer: Medicare Other | Admitting: Occupational Therapy

## 2019-05-11 ENCOUNTER — Encounter: Payer: Medicare Other | Admitting: Occupational Therapy

## 2019-05-14 ENCOUNTER — Encounter: Payer: Medicare Other | Admitting: Occupational Therapy

## 2019-05-17 ENCOUNTER — Encounter: Payer: Medicare Other | Admitting: Occupational Therapy

## 2019-05-19 ENCOUNTER — Encounter: Payer: Medicare Other | Admitting: Occupational Therapy

## 2019-05-24 ENCOUNTER — Encounter: Payer: Medicare Other | Admitting: Occupational Therapy

## 2019-06-07 ENCOUNTER — Other Ambulatory Visit
Admission: RE | Admit: 2019-06-07 | Discharge: 2019-06-07 | Disposition: A | Discharge status: Home / Self Care | Payer: Medicare Other | Source: Ambulatory Visit | Attending: Internal Medicine | Admitting: Internal Medicine

## 2019-06-07 DIAGNOSIS — M4646 Discitis, unspecified, lumbar region: Secondary | ICD-10-CM | POA: Diagnosis present

## 2019-06-07 DIAGNOSIS — G8929 Other chronic pain: Secondary | ICD-10-CM | POA: Insufficient documentation

## 2019-06-07 LAB — CBC WITH DIFFERENTIAL/PLATELET
Abs Immature Granulocytes: 0.02 10*3/uL (ref 0.00–0.07)
Basophils Absolute: 0 10*3/uL (ref 0.0–0.1)
Basophils Relative: 1 %
Eosinophils Absolute: 0.2 10*3/uL (ref 0.0–0.5)
Eosinophils Relative: 5 %
HCT: 34.9 % — ABNORMAL LOW (ref 36.0–46.0)
Hemoglobin: 10.6 g/dL — ABNORMAL LOW (ref 12.0–15.0)
Immature Granulocytes: 1 %
Lymphocytes Relative: 29 %
Lymphs Abs: 1.3 10*3/uL (ref 0.7–4.0)
MCH: 25.9 pg — ABNORMAL LOW (ref 26.0–34.0)
MCHC: 30.4 g/dL (ref 30.0–36.0)
MCV: 85.3 fL (ref 80.0–100.0)
Monocytes Absolute: 0.4 10*3/uL (ref 0.1–1.0)
Monocytes Relative: 10 %
Neutro Abs: 2.3 10*3/uL (ref 1.7–7.7)
Neutrophils Relative %: 54 %
Platelets: 347 10*3/uL (ref 150–400)
RBC: 4.09 MIL/uL (ref 3.87–5.11)
RDW: 13.6 % (ref 11.5–15.5)
WBC: 4.3 10*3/uL (ref 4.0–10.5)
nRBC: 0 % (ref 0.0–0.2)

## 2019-06-07 LAB — COMPREHENSIVE METABOLIC PANEL
ALT: 28 U/L (ref 0–44)
AST: 30 U/L (ref 15–41)
Albumin: 3.9 g/dL (ref 3.5–5.0)
Alkaline Phosphatase: 124 U/L (ref 38–126)
Anion gap: 11 (ref 5–15)
BUN: 14 mg/dL (ref 8–23)
CO2: 25 mmol/L (ref 22–32)
Calcium: 9.2 mg/dL (ref 8.9–10.3)
Chloride: 102 mmol/L (ref 98–111)
Creatinine, Ser: 1.03 mg/dL — ABNORMAL HIGH (ref 0.44–1.00)
GFR calc Af Amer: >60 mL/min (ref >60)
GFR calc non Af Amer: 58 mL/min — ABNORMAL LOW (ref >60)
Glucose, Bld: 115 mg/dL — ABNORMAL HIGH (ref 70–99)
Potassium: 4 mmol/L (ref 3.5–5.1)
Sodium: 138 mmol/L (ref 135–145)
Total Bilirubin: 0.5 mg/dL (ref 0.3–1.2)
Total Protein: 7 g/dL (ref 6.5–8.1)

## 2019-06-07 LAB — VANCOMYCIN, TROUGH: Vancomycin Tr: 23 ug/mL (ref 15–20)

## 2019-06-15 ENCOUNTER — Other Ambulatory Visit (HOSPITAL_COMMUNITY)
Admission: RE | Admit: 2019-06-15 | Discharge: 2019-06-15 | Disposition: A | Discharge status: Home / Self Care | Payer: Medicare Other | Source: Other Acute Inpatient Hospital | Attending: Internal Medicine | Admitting: Internal Medicine

## 2019-06-15 DIAGNOSIS — M4646 Discitis, unspecified, lumbar region: Secondary | ICD-10-CM | POA: Diagnosis present

## 2019-06-15 DIAGNOSIS — G8929 Other chronic pain: Secondary | ICD-10-CM | POA: Diagnosis present

## 2019-06-15 DIAGNOSIS — M545 Low back pain: Secondary | ICD-10-CM | POA: Diagnosis present

## 2019-06-15 LAB — COMPREHENSIVE METABOLIC PANEL
ALT: 26 U/L (ref 0–44)
AST: 22 U/L (ref 15–41)
Albumin: 3.9 g/dL (ref 3.5–5.0)
Alkaline Phosphatase: 113 U/L (ref 38–126)
Anion gap: 13 (ref 5–15)
BUN: 13 mg/dL (ref 8–23)
CO2: 26 mmol/L (ref 22–32)
Calcium: 8.9 mg/dL (ref 8.9–10.3)
Chloride: 95 mmol/L — ABNORMAL LOW (ref 98–111)
Creatinine, Ser: 0.96 mg/dL (ref 0.44–1.00)
GFR calc Af Amer: >60 mL/min (ref >60)
GFR calc non Af Amer: >60 mL/min (ref >60)
Glucose, Bld: 94 mg/dL (ref 70–99)
Potassium: 4.1 mmol/L (ref 3.5–5.1)
Sodium: 134 mmol/L — ABNORMAL LOW (ref 135–145)
Total Bilirubin: 0.6 mg/dL (ref 0.3–1.2)
Total Protein: 7.3 g/dL (ref 6.5–8.1)

## 2019-06-15 LAB — CBC WITH DIFFERENTIAL/PLATELET
Abs Immature Granulocytes: 0.01 10*3/uL (ref 0.00–0.07)
Basophils Absolute: 0 10*3/uL (ref 0.0–0.1)
Basophils Relative: 1 %
Eosinophils Absolute: 0.2 10*3/uL (ref 0.0–0.5)
Eosinophils Relative: 5 %
HCT: 35.9 % — ABNORMAL LOW (ref 36.0–46.0)
Hemoglobin: 11 g/dL — ABNORMAL LOW (ref 12.0–15.0)
Immature Granulocytes: 0 %
Lymphocytes Relative: 34 %
Lymphs Abs: 1.2 10*3/uL (ref 0.7–4.0)
MCH: 26.9 pg (ref 26.0–34.0)
MCHC: 30.6 g/dL (ref 30.0–36.0)
MCV: 87.8 fL (ref 80.0–100.0)
Monocytes Absolute: 0.4 10*3/uL (ref 0.1–1.0)
Monocytes Relative: 11 %
Neutro Abs: 1.7 10*3/uL (ref 1.7–7.7)
Neutrophils Relative %: 49 %
Platelets: 339 10*3/uL (ref 150–400)
RBC: 4.09 MIL/uL (ref 3.87–5.11)
RDW: 13.6 % (ref 11.5–15.5)
WBC: 3.4 10*3/uL — ABNORMAL LOW (ref 4.0–10.5)
nRBC: 0 % (ref 0.0–0.2)

## 2019-06-15 LAB — VANCOMYCIN, TROUGH: Vancomycin Tr: 18 ug/mL (ref 15–20)

## 2019-08-23 ENCOUNTER — Encounter: Payer: Self-pay | Admitting: Occupational Therapy

## 2019-08-23 ENCOUNTER — Other Ambulatory Visit: Payer: Self-pay

## 2019-08-23 ENCOUNTER — Ambulatory Visit: Payer: Medicare Other | Attending: Family Medicine | Admitting: Occupational Therapy

## 2019-08-23 DIAGNOSIS — I890 Lymphedema, not elsewhere classified: Secondary | ICD-10-CM | POA: Insufficient documentation

## 2019-08-24 NOTE — Patient Instructions (Signed)

## 2019-08-24 NOTE — Therapy (Signed)
[facility] MAIN [facility] SERVICES [address] [place], [place], [address] Phone: [phone]   Fax:  [phone]  Occupational Therapy Evaluation  Patient Details  Name: Nancy Riley MRN: [id number] Date of Birth: January 17, 1956 Referring Provider (OT): Darreld Mclean, MD   Encounter Date: 08/23/2019  OT End of Session - 08/24/19 1123    Visit Number  1    Number of Visits  36    Date for OT Re-Evaluation  11/21/19    OT Start Time  1100    OT Stop Time  1220    OT Time Calculation (min)  80 min    Activity Tolerance  Patient tolerated treatment well;No increased pain    Behavior During Therapy  WFL for tasks assessed/performed       Past Medical History:  Diagnosis Date  . Diabetes mellitus without complication (HCC)   . Hypercholesteremia   . Hypertension     Past Surgical History:  Procedure Laterality Date  . BREAST EXCISIONAL BIOPSY Left 1970   negative  . COLONOSCOPY      There were no vitals filed for this visit.  Subjective Assessment - 08/24/19 1045    Subjective   [name] is referred to Occupational Therapy by Darreld Mclean, M0D for resumption of BLE lymphedema care, including evaluation and tratmentnt. Pt was last seen on 04/26/19 when she was admitted to [facility] after our session with spinal myelitis (spondylodiscitis). She was hospitalized from 11/23-11/27, then treated at home for 6 weeks with IV antibiotics. Spinal MRI in early Jan, 2021 revealed infection was nearly resolved. Pt returns today with new complaints , including increased bilateral leg swelling, weight gain, worsened pain in knees and back. Pt reports she wears existing custom compression knee highs daily as recommended, donningherself!, and she uses her sequential pneumatic compression device, otherwise known as a "pump" daily on alternating legs. Pt's goal for returning to OT for CDT is to evaluate condition    Pertinent History  Spondylodiscitis,  BLE lymphedema, HTN, DM, neuropathy, DJD, arthritis, sciatica,  chronic back pain, morbid obesity, (BMI 50-59.9)    Limitations  Difficulty walking, impaired transfers/functional mobility, chronic back DJD and knee  OA pain , chronic leg swelling decreased strength, impaired balance, impaired sensation (neuropathy in feet), increased infection risk, increased fall risk,    Repetition  Increases Symptoms    Special Tests  + Stemmer bilaterally    Patient Stated Goals  get swelling back down and replace worn compression stockings to limit LE progression    Currently in Pain?  Yes    Pain Score  5     Pain Location  Leg    Pain Orientation  Right;Left    Pain Descriptors / Indicators  Aching;Nagging;Tender;Throbbing;Sore;Numbness;Discomfort;Constant;Heaviness;Tightness;Tingling;Squeezing;Penetrating;Cramping;Tiring    Pain Type  Chronic pain    Pain Onset  Other (comment)   chronic   Pain Frequency  Constant    Aggravating Factors   standing, walking, transfersextended dependent sitting, stair climbing/descending    Pain Relieving Factors  compression garments/ wraps, basic sequential pneumatic compression device ("pump" used daily on alternating legs), rubbing, MLD, elevation w/ knees bent    Effect of Pain on Daily Activities  BLE swelling and associated pain limit  functional ambulation and transfers, bed mobility, community mobility, basic and instrumental ADLs (sleeping, LB bathing, LB dressing, nail care, diabetic skin inspection and skin care, home management, laundry, cooking, meal prep, dishes, house cleaning, making beds) productive activities and work (disabled-unable to work  or volunteer), leisure pursuits ( unable to participate in any leisure activity requiring standing, walking and/ or extended sitting), social participation, body image    Multiple Pain Sites  Yes    Pain Score  5    Pain Location  Back    Pain Orientation  Lower    Pain Descriptors / Indicators  Aching;Sore    Pain  Type  Chronic pain    Pain Frequency  Constant    Aggravating Factors   standing, walking, transfers, dependent sitting, bending, reaching, lifting, carrying    Pain Relieving Factors  medication        [facility] OT Assessment - 08/24/19 0001      Assessment   Medical Diagnosis  severe , stage II, BLE lymphedema 2/2 morbid obesity. Contributng factors include inflammation 2/2 OA, chronmic leg and back pain, depenxdent sitting    Referring Provider (OT)  Darreld Mclean, MD    Prior Therapy  CDT with this OT 12/02/18 - 11/20. Pt  achieved 41.13% RLE volume reduction below the knee, and 22.30% limb volume reduction on LLE  on 04/14/19. 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   Yes      Home  Environment   Family/patient expects to be discharged to:  Private residence    Living Arrangements  Children   adult daughter and grandchild   Available Help at Discharge  Family    Type of Home  Mobile home    Home Access  Stairs    Entrance Stairs-Rails  Right;Left    Entrance Stairs-Number of Steps  5?    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'   Vocation  Requirements  climbing stairs to mother's home, driving    Leisure  family    Comments  would like to be able to go out shopping      Cognition   Overall Cognitive Status  Within Functional Limits for tasks assessed      Moderate, Stage II, Bilateral lower extremity/ lymphedema (LE) 2/2 morbid obesity Skin  Description Hyper-Keratosis Peau' de Orange Shiny Tight Fibrotic Fatty Doughy Indurated    X Medial thighs  x x x    x   Skin dry Flaky Erythema Macerated   x x     Color Redness Present Pallor Blanching Hemosiderin Staining Other   x        Odor Malodorous Yeast Fungal infection  Absent      x   Temperature Warm Cool wnl    x    Pitting Edema   1+  2+ 3+ 4+ Non-pitting/ indurated        x   Girth Symmetrical Asymmetrical Other Distribution     Knees to groin bilaterally   Stemmer Sign Positive Negative     + BLE    Lymphorrea History Of:  Present Absent      x   Wounds History Of Present Absent Venous Arterial Pressure Size      X          Signs of Infection Redness Warmth Erythema Acute Swelling Drainage Borders                   Scars  Adhesions Hypersensitivity          Sensation Light Touch Deep pressure Hypersensitivty   Present Impaired Present Impaired Absent Impaired    x x  x   x  Nails WNL Fungus Other    x   x Hair Growth Symmetrical Asymmetrical       Skin Creases Base of toes  Ankles   Base of Fingers Medial Thighs         Abdominal pannus Medial legs     x x   X lobules  x x     LYMPHEDEMA/ONCOLOGY QUESTIONNAIRE - 08/24/19 1116      What other symptoms do you have   Are you Having Heaviness or Tightness  Yes    Are you having Pain  Yes    Are you having pitting edema  No    Is it Hard or Difficult finding clothes that fit  Yes    Do you have infections  Yes    Stemmer Sign  Yes      Lymphedema Stage   Stage  STAGE 2 SPONTANEOUSLY IRREVERSIBLE      Lymphedema Assessments   Lymphedema Assessments  Lower  extremities      Right Lower Extremity Lymphedema   Other  RLE limb volume from ankle to popliteal fossa (A-D) =8308.07 ml.    Other  RLE A-D volume is increased by 25.01% since last measured on 04/14/19, and decreased overall by 8.40% since   initially measuring on 12/08/18.      Left Lower Extremity Lymphedema   Other  LLE increased by 24.36% since 04/14/19. LLE decreased by 3.4% snce initial evaluation.             OT Treatments/Exercises (OP) - 08/24/19 0001      Bed Mobility   Bed Mobility  Sit to Supine;Sitting - Scoot to Edge of Bed;Supine to Sit;Left Sidelying to Sit;Right Sidelying to Sit;Rolling Left;Rolling Right;Scooting to [facility]    Rolling Right  Moderate Assistance - Patient 50-74%    Rolling Left  Moderate Assistance - Patient 50-74%    Right Sidelying to Sit  Maximal Assistance - Patient 25-49%    Left Sidelying to Sit  Maximal Assistance - Patient 25-49%    Supine to Sit  Maximal Assistance - Patient - Patient 25-49%    Sitting - Scoot to Edge of Bed  Maximal Assistance - Patient 25-49%    Sit to Supine  Moderate Assistance - Patient 50-74%    Scooting to [facility]  Maximal Assistance - Patient 25-49%      Transfers   Transfers  Sit to Stand;Stand to Sit    Sit to Stand  2: Max assist;From elevated surface;With upper extremity assist    Stand to Sit  With upper extremity assist;4: Min assist      ADLs   Overall  ADLs  limited by BLE pain and swelling, and by back and knee pain    Grooming  unable to reach feet and distal legs to groom nails, perform skin care, inspect skin,  wash feet and legs    UB Dressing  extra time    LB Dressing  difficulty fitting LB clothing and footwear due to limb swelling . Unable to tie shoes. needs shoehorn to don slippers.     Toileting  modified independence (assitive device    Bathing  modified independence w/ assistive devices t to rech distall legs and feet to bathe    Functional Mobility  all transfers impaired. difficulty lifting  legs    Cooking  unable to stand to cook, prep meals or clean kitchen due to LE pain and swelling and chronic back pain    Home Maintenance  unable to perform    Driving  short distances in SUV with elevated seat to floor height    Work  disabled    Leisure  limited by leg pain and swelling, difficulty walkiing    ADL Education Given  Yes      Manual Therapy   Manual Therapy  Edema management            OT Education - 08/24/19 1121    Education Details  Provided Pt and family education regarding lymphatic structure and function, etiologies, onset patterns and stages of progression. Discussed  impact of obesity on lymphatic function. Outlined Complete Decongestive Therapy (CDT)  as standard of care and provided in depth information regarding 4 primary components of both Intensive and Self Management Phases, including Manual Lymph Drainage (MLD), compression wrapping and garments, skin care, and therapeutic exercise.   Homero FellersFrank discussion of high burden of care,  Discussed  Importance of daily, ongoing LE self-care essential to retaining clinical gains and limiting progression.  Lastly, reviewed lymphedema precautions, including cellulitis risk and difficulty with wound healing. Provided printed Lymphedema Workbook for reference.    Person(s) Educated  Patient    Methods  Explanation;Demonstration;Tactile cues;Verbal cues    Comprehension  Verbalized understanding;Need further instruction;Verbal cues required;Returned demonstration          OT Long Term Goals - 08/24/19 1124      OT LONG TERM GOAL #1   Title  Pt will demonstrate understanding of lymphedema precautions and signs/ symptoms of cellulitisby identifying 6 items for each using printed Lymphedema Workbook for reference PRN (modified independence) to limit LE progression and infection risk.    Baseline  minA    Time  4    Period  Days    Status  New      OT LONG TERM GOAL #2   Title  Pt will require Max assist from  caregiver who will be able to apply multilayer, gradient compression wraps using correct techniques after skilled training to reduce limb volume and limity LE progression.    Baseline  dependent    Time  4    Period  Days    Status  New      OT LONG TERM GOAL #3   Title  Pt will achieve 10% limb volume reductions  bilaterally from ankle to tibial tuberosity during Intensive Phase CDT to return limbs to typical size and shape ,. reduce infection risk and improve basic andf instrumental ALDs performance.    Baseline  dependent  01/04/19: achieved 12/31/18 for RLE with 26.73% limb volume reduction below the knee.    Time  12  Period  Days    Status  New      OT LONG TERM GOAL #4   Title  Pt will consistently perform all LE self -care home protocols ( simple self MLD, skin care, therapeutic exercise, compression therapies) daily  with max CG assistance to to achieve max edema reduction, to enanble functional improvements,  including fitting preferred street shoes and standard sized clothing, and to achieve increased AROM and reduced falls risk.    Baseline  Max A    Time  12    Period  Weeks    Status  New    Target Date  11/22/19      OT LONG TERM GOAL #5   Title  Patient will schedule a consult this bariatric medicine specialist Enrigue Catena, MD- [facility]) to demonstrate understanding of  the importance of weight management,to achieve and sustain optimal control of limb swelling over time.    Baseline  Max A    Time  12    Period  Weeks    Status  New              Patient will benefit from skilled therapeutic intervention in order to improve the following deficits and impairments:           Visit Diagnosis: Lymphedema, not elsewhere classified - Plan: Ot plan of care cert/re-cert    Problem List There are no problems to display for this patient.   Andrey Spearman, MS, OTR/L, [facility] 08/24/19 11:36 AM  [place] MAIN [facility]  SERVICES [address] [place], [place], [address] Phone: [phone]   Fax:  [phone]  Name: BLINDA TUREK MRN: [id number] Date of Birth: [birth year]

## 2019-08-31 ENCOUNTER — Ambulatory Visit: Payer: Medicare Other | Admitting: Occupational Therapy

## 2019-08-31 ENCOUNTER — Other Ambulatory Visit: Payer: Self-pay

## 2019-08-31 DIAGNOSIS — I890 Lymphedema, not elsewhere classified: Secondary | ICD-10-CM

## 2019-08-31 NOTE — Therapy (Signed)
[place] [facility] MAIN [facility] SERVICES [address] [place], [place], [address] Phone: [phone]   Fax:  [phone]  Occupational Therapy Treatment  Patient Details  Name: Nancy Riley MRN: [id number] Date of Birth: 11/27/1955 Referring Provider (OT): Darreld Mclean, MD   Encounter Date: 08/31/2019  OT End of Session - 08/31/19 1105    Visit Number  2    Number of Visits  36    Date for OT Re-Evaluation  11/21/19    OT Start Time  1000    OT Stop Time  1058    OT Time Calculation (min)  58 min    Activity Tolerance  Patient tolerated treatment well;No increased pain    Behavior During Therapy  WFL for tasks assessed/performed       Past Medical History:  Diagnosis Date  . Diabetes mellitus without complication (HCC)   . Hypercholesteremia   . Hypertension     Past Surgical History:  Procedure Laterality Date  . BREAST EXCISIONAL BIOPSY Left 1970   negative  . COLONOSCOPY      There were no vitals filed for this visit.  Subjective Assessment - 08/31/19 1006    Subjective   Nancy Riley returns to OT to recommence Intensive Phase CDT for BLE lymphedema. Pt arrives seated in transport wc. Pt treated in wc today with legs elevated on footstool as  positioning on Treatment bed is too uncomfortable. Pt denies back pain unless standing. Pt reports B knee pain 7/10. "Sunday my knees hurt so bad I sat there and cried."    Pertinent History  Spondylodiscitis, BLE lymphedema, HTN, DM, neuropathy, DJD, arthritis, sciatica,  chronic back pain, morbid obesity, (BMI 50-59.9)    Limitations  Difficulty walking, impaired transfers/functional mobility, chronic back DJD and knee  OA pain , chronic leg swelling decreased strength, impaired balance, impaired sensation (neuropathy in feet), increased infection risk, increased fall risk,    Repetition  Increases Symptoms    Special Tests  + Stemmer bilaterally    Patient Stated Goals  get swelling back down  and replace worn compression stockings to limit LE progression    Pain Onset  Other (comment)   chronic                  OT Treatments/Exercises (OP) - 08/31/19 0001      ADLs   ADL Education Given  Yes      Manual Therapy   Manual Therapy  Edema management;Manual Lymphatic Drainage (MLD);Compression Bandaging    Edema Management  skin care with low ph castor oil throughout LLE MLD to improve hydration and flexibility    Manual Lymphatic Drainage (MLD)  LLE MLD as established , including short neck sequence, deep diaphragmatic breathing, functional L inguinal LN and lLE segments from proximal to distal. Good tolerance.    Compression Bandaging  Multilayer compression wraps to LLE using gradient techniques. Excluded foot today on trial basis in effort to reduce falls risk while shoes employed for   compression. Used  one each 8.10  and 12 cm wide short stretch wraps staring with 8 cm herringbone pattern to eastablish gradient, 10, 12 1ne 15 cm wraps applied circumferentially -all over single layer Rosidal foam and stockinett from ankle to tibial tuberosity.             OT Education - 08/31/19 1106    Education Details  Continued skilled Pt/caregiver education  And LE ADL training throughout visit for lymphedema self care/  home program, including compression wrapping, compression garment and device wear/care, lymphatic pumping ther ex, simple self-MLD, and skin care. Discussed progress towards goals.    Person(s) Educated  Patient    Methods  Explanation;Demonstration    Comprehension  Verbalized understanding;Need further instruction;Returned demonstration          OT Long Term Goals - 08/24/19 1124      OT LONG TERM GOAL #1   Title  Pt will demonstrate understanding of lymphedema precautions and signs/ symptoms of cellulitisby identifying 6 items for each using printed Lymphedema Workbook for reference PRN (modified independence) to limit LE progression and  infection risk.    Baseline  minA    Time  4    Period  Days    Status  New      OT LONG TERM GOAL #2   Title  Pt will require Max assist from caregiver who will be able to apply multilayer, gradient compression wraps using correct techniques after skilled training to reduce limb volume and limity LE progression.    Baseline  dependent    Time  4    Period  Days    Status  New      OT LONG TERM GOAL #3   Title  Pt will achieve 10% limb volume reductions  bilaterally from ankle to tibial tuberosity during Intensive Phase CDT to return limbs to typical size and shape ,. reduce infection risk and improve basic andf instrumental ALDs performance.    Baseline  dependent  01/04/19: achieved 12/31/18 for RLE with 26.73% limb volume reduction below the knee.    Time  12    Period  Days    Status  New      OT LONG TERM GOAL #4   Title  Pt will consistently perform all LE self -care home protocols ( simple self MLD, skin care, therapeutic exercise, compression therapies) daily  with max CG assistance to to achieve max edema reduction, to enanble functional improvements,  including fitting preferred street shoes and standard sized clothing, and to achieve increased AROM and reduced falls risk.    Baseline  Max A    Time  12    Period  Weeks    Status  New    Target Date  11/22/19      OT LONG TERM GOAL #5   Title  Patient will schedule a consult this bariatric medicine specialist Enrigue Catena, MD- [facility]) to demonstrate understanding of  the importance of weight management,to achieve and sustain optimal control of limb swelling over time.    Baseline  Max A    Time  12    Period  Weeks    Status  New            Plan - 08/31/19 1107    Clinical Impression Statement  Recommenced CDT starting with LLE   initally this morning. Provided manual lymphatic drainage to LLE/LLQ, including light fibrosis techniques to ankle laterally, without increasing pain.Applied multi-layer , short stretch, knee  length compression wraps starting at ankle and extending to tibial tuberosity. Foot excluded on trial basis today in consideration of falls risk. Skin in excellent condition. Pt able to use leg lifter afer brief teaching to assist with positioning legs on foot stoor forelevation during MLD. Cont as per POC.    Occupational performance deficits (Please refer to evaluation for details):  ADL's;Work;IADL's;Rest and Sleep;Leisure;Social Participation;Other   role performance      Patient will benefit from skilled  therapeutic intervention in order to improve the following deficits and impairments:           Visit Diagnosis: Lymphedema, not elsewhere classified    Problem List There are no problems to display for this patient.   Loel Dubonnet, MS, OTR/L, [facility] 08/31/19 12:54 PM    [facility] MAIN [facility] SERVICES [address] [place], [place], [address] Phone: [phone]   Fax:  [phone]  Name: Nancy Riley MRN: [id number] Date of Birth: [birth year]

## 2019-09-03 ENCOUNTER — Other Ambulatory Visit: Payer: Self-pay

## 2019-09-03 ENCOUNTER — Ambulatory Visit: Payer: Medicare PPO | Attending: Family Medicine | Admitting: Occupational Therapy

## 2019-09-03 DIAGNOSIS — I890 Lymphedema, not elsewhere classified: Secondary | ICD-10-CM | POA: Insufficient documentation

## 2019-09-03 NOTE — Therapy (Signed)
[place] [facility] MAIN [facility] SERVICES [address] [place], [place], [address] Phone: [phone]   Fax:  [phone]  Occupational Therapy Treatment  Patient Details  Name: Nancy Riley MRN: [id number] Date of Birth: [birth year] Referring Provider (OT): Darreld Mclean, MD   Encounter Date: 09/03/2019  OT End of Session - 09/03/19 0913    Visit Number  3    Number of Visits  36    Date for OT Re-Evaluation  11/21/19    OT Start Time  0906    OT Stop Time  1006    OT Time Calculation (min)  60 min    Activity Tolerance  Patient tolerated treatment well;No increased pain    Behavior During Therapy  WFL for tasks assessed/performed       Past Medical History:  Diagnosis Date  . Diabetes mellitus without complication (HCC)   . Hypercholesteremia   . Hypertension     Past Surgical History:  Procedure Laterality Date  . BREAST EXCISIONAL BIOPSY Left 1970   negative  . COLONOSCOPY      There were no vitals filed for this visit.  Subjective Assessment - 09/03/19 0910    Subjective   Nancy Riley presents for OT visit 3/36 to treat BLE lymphedema. Pt presents with LLE gradient compression wraps in place.  Pt states, "my pain level in my knees is not too bad this morning. It's abouty a 3. My back's doing pretty good. I give it about a 3 too."    Pertinent History  Spondylodiscitis, BLE lymphedema, HTN, DM, neuropathy, DJD, arthritis, sciatica,  chronic back pain, morbid obesity, (BMI 50-59.9)    Limitations  Difficulty walking, impaired transfers/functional mobility, chronic back DJD and knee  OA pain , chronic leg swelling decreased strength, impaired balance, impaired sensation (neuropathy in feet), increased infection risk, increased fall risk,    Repetition  Increases Symptoms    Special Tests  + Stemmer bilaterally    Patient Stated Goals  get swelling back down and replace worn compression stockings to limit LE progression    Pain Onset   Other (comment)   chronic                  OT Treatments/Exercises (OP) - 09/03/19 0001      ADLs   ADL Education Given  Yes      Manual Therapy   Manual Therapy  Edema management;Manual Lymphatic Drainage (MLD);Compression Bandaging    Edema Management  skin care with low ph castor oil throughout LLE MLD to improve hydration and flexibility    Manual Lymphatic Drainage (MLD)  LLE MLD as established , including short neck sequence, deep diaphragmatic breathing, functional L inguinal LN and lLE segments from proximal to distal. Good tolerance.    Compression Bandaging  Multilayer compression wraps to LLE using gradient techniques. Excluded foot today on trial basis in effort to reduce falls risk while shoes employed for   compression. Used  one each 8.10  and 12 cm wide short stretch wraps staring with 8 cm herringbone pattern to eastablish gradient, 10, 12 1ne 15 cm wraps applied circumferentially -all over single layer Rosidal foam and stockinett from ankle to tibial tuberosity.             OT Education - 09/03/19 0913    Education Details  Continued skilled Pt/caregiver education  And LE ADL training throughout visit for lymphedema self care/ home program, including compression wrapping, compression garment and device wear/care,  lymphatic pumping ther ex, simple self-MLD, and skin care. Discussed progress towards goals.    Person(s) Educated  Patient    Methods  Explanation;Demonstration    Comprehension  Verbalized understanding;Need further instruction;Returned demonstration          OT Long Term Goals - 08/24/19 1124      OT LONG TERM GOAL #1   Title  Pt will demonstrate understanding of lymphedema precautions and signs/ symptoms of cellulitisby identifying 6 items for each using printed Lymphedema Workbook for reference PRN (modified independence) to limit LE progression and infection risk.    Baseline  minA    Time  4    Period  Days    Status  New       OT LONG TERM GOAL #2   Title  Pt will require Max assist from caregiver who will be able to apply multilayer, gradient compression wraps using correct techniques after skilled training to reduce limb volume and limity LE progression.    Baseline  dependent    Time  4    Period  Days    Status  New      OT LONG TERM GOAL #3   Title  Pt will achieve 10% limb volume reductions  bilaterally from ankle to tibial tuberosity during Intensive Phase CDT to return limbs to typical size and shape ,. reduce infection risk and improve basic andf instrumental ALDs performance.    Baseline  dependent  01/04/19: achieved 12/31/18 for RLE with 26.73% limb volume reduction below the knee.    Time  12    Period  Days    Status  New      OT LONG TERM GOAL #4   Title  Pt will consistently perform all LE self -care home protocols ( simple self MLD, skin care, therapeutic exercise, compression therapies) daily  with max CG assistance to to achieve max edema reduction, to enanble functional improvements,  including fitting preferred street shoes and standard sized clothing, and to achieve increased AROM and reduced falls risk.    Baseline  Max A    Time  12    Period  Weeks    Status  New    Target Date  11/22/19      OT LONG TERM GOAL #5   Title  Patient will schedule a consult this bariatric medicine specialist Enrigue Catena, MD- [facility]) to demonstrate understanding of  the importance of weight management,to achieve and sustain optimal control of limb swelling over time.    Baseline  Max A    Time  12    Period  Weeks    Status  New            Plan - 09/03/19 1027    Clinical Impression Statement  LLE limb volume and tissue density noticably decreased today when compression wraps removed. Pt demonstrating qick response to CDT. She continues with diligent famioly support for compression wrapping between visits at home. Pt tolerated MLD, including deep fibrosis work at ankle,  and compression wrapping today  with no increased pain. Provided skin care before re-wrapping to increase skin hydration and flexibility. Cont as per POC.    Occupational performance deficits (Please refer to evaluation for details):  ADL's;Work;IADL's;Rest and Sleep;Leisure;Social Participation;Other   role performance      Patient will benefit from skilled therapeutic intervention in order to improve the following deficits and impairments:           Visit Diagnosis: Lymphedema, not elsewhere  classified    Problem List There are no problems to display for this patient.   Loel Dubonnet, MS, OTR/L, [facility] 09/03/19 10:31 AM  [place] [facility] MAIN [facility] SERVICES [address] [place], [place], [address] Phone: [phone]   Fax:  [phone]  Name: Nancy Riley MRN: [id number] Date of Birth: [birth year]

## 2019-09-07 ENCOUNTER — Ambulatory Visit: Payer: Medicare PPO | Admitting: Occupational Therapy

## 2019-09-07 ENCOUNTER — Other Ambulatory Visit: Payer: Self-pay

## 2019-09-07 DIAGNOSIS — I890 Lymphedema, not elsewhere classified: Secondary | ICD-10-CM | POA: Diagnosis not present

## 2019-09-07 NOTE — Therapy (Signed)
[facility] MAIN [facility] SERVICES [address] [place], [place], [address] Phone: [phone]   Fax:  [phone]  Occupational Therapy Treatment  Patient Details  Name: Nancy Riley MRN: [id number] Date of Birth: December 26, 1955 Referring Provider (OT): Darreld Mclean, MD   Encounter Date: 09/07/2019  OT End of Session - 09/07/19 0913    Visit Number  4    Number of Visits  36    Date for OT Re-Evaluation  11/21/19    OT Start Time  0903    OT Stop Time  0952    OT Time Calculation (min)  49 min    Activity Tolerance  Patient tolerated treatment well;No increased pain    Behavior During Therapy  WFL for tasks assessed/performed       Past Medical History:  Diagnosis Date  . Diabetes mellitus without complication (HCC)   . Hypercholesteremia   . Hypertension     Past Surgical History:  Procedure Laterality Date  . BREAST EXCISIONAL BIOPSY Left 1970   negative  . COLONOSCOPY      There were no vitals filed for this visit.  Subjective Assessment - 09/07/19 0910    Subjective   Nancy Riley presents for OT visit 4/36 to treat BLE lymphedema. Pt presents with LLE gradient compression wraps in place.  Pt reports 5/10 pain in knees, and back rated 3/10. Pt has no questions or new concerns re LE.    Pertinent History  Spondylodiscitis, BLE lymphedema, HTN, DM, neuropathy, DJD, arthritis, sciatica,  chronic back pain, morbid obesity, (BMI 50-59.9)    Limitations  Difficulty walking, impaired transfers/functional mobility, chronic back DJD and knee  OA pain , chronic leg swelling decreased strength, impaired balance, impaired sensation (neuropathy in feet), increased infection risk, increased fall risk,    Repetition  Increases Symptoms    Special Tests  + Stemmer bilaterally    Patient Stated Goals  get swelling back down and replace worn compression stockings to limit LE progression    Pain Onset  Other (comment)   chronic                   OT Treatments/Exercises (OP) - 09/07/19 0001      ADLs   ADL Education Given  Yes      Manual Therapy   Manual Therapy  Edema management;Manual Lymphatic Drainage (MLD);Compression Bandaging    Edema Management  skin care with low ph castor oil throughout LLE MLD to improve hydration and flexibility    Manual Lymphatic Drainage (MLD)  LLE MLD as established , including short neck sequence, deep diaphragmatic breathing, functional L inguinal LN and lLE segments from proximal to distal. Good tolerance.    Compression Bandaging  Multilayer compression wraps to LLE using gradient techniques. Excluded foot today on trial basis in effort to reduce falls risk while shoes employed for   compression. Used  one each 8.10  and 12 cm wide short stretch wraps staring with 8 cm herringbone pattern to eastablish gradient, 10, 12 1ne 15 cm wraps applied circumferentially -all over single layer Rosidal foam and stockinett from ankle to tibial tuberosity.             OT Education - 09/07/19 0912    Education Details  Continued skilled Pt/caregiver education  And LE ADL training throughout visit for lymphedema self care/ home program, including compression wrapping, compression garment and device wear/care, lymphatic pumping ther ex, simple self-MLD, and skin care. Discussed progress  towards goals.    Person(s) Educated  Patient    Methods  Explanation;Demonstration    Comprehension  Verbalized understanding;Need further instruction;Returned demonstration          OT Long Term Goals - 08/24/19 1124      OT LONG TERM GOAL #1   Title  Pt will demonstrate understanding of lymphedema precautions and signs/ symptoms of cellulitisby identifying 6 items for each using printed Lymphedema Workbook for reference PRN (modified independence) to limit LE progression and infection risk.    Baseline  minA    Time  4    Period  Days    Status  New      OT LONG TERM GOAL #2   Title   Pt will require Max assist from caregiver who will be able to apply multilayer, gradient compression wraps using correct techniques after skilled training to reduce limb volume and limity LE progression.    Baseline  dependent    Time  4    Period  Days    Status  New      OT LONG TERM GOAL #3   Title  Pt will achieve 10% limb volume reductions  bilaterally from ankle to tibial tuberosity during Intensive Phase CDT to return limbs to typical size and shape ,. reduce infection risk and improve basic andf instrumental ALDs performance.    Baseline  dependent  01/04/19: achieved 12/31/18 for RLE with 26.73% limb volume reduction below the knee.    Time  12    Period  Days    Status  New      OT LONG TERM GOAL #4   Title  Pt will consistently perform all LE self -care home protocols ( simple self MLD, skin care, therapeutic exercise, compression therapies) daily  with max CG assistance to to achieve max edema reduction, to enanble functional improvements,  including fitting preferred street shoes and standard sized clothing, and to achieve increased AROM and reduced falls risk.    Baseline  Max A    Time  12    Period  Weeks    Status  New    Target Date  11/22/19      OT LONG TERM GOAL #5   Title  Patient will schedule a consult this bariatric medicine specialist Enrigue Catena, MD- [facility]) to demonstrate understanding of  the importance of weight management,to achieve and sustain optimal control of limb swelling over time.    Baseline  Max A    Time  12    Period  Weeks    Status  New            Plan - 09/07/19 0959    Clinical Impression Statement  Soe fit better without foot wrap applied last session, but foot and distal ankle appears with increased swelling  today. Reapplied wraps at end of sessiohn adding foot wrap technique back to capture better contol of these areas. Pt tolerating manual therapy and comnpression well without extra pain. She continues to use pneumatic compression  device on RLE between visits since LLE is wrapped. Cont as per POC. Excellent progress towards all goals.    Occupational performance deficits (Please refer to evaluation for details):  ADL's;Work;IADL's;Rest and Sleep;Leisure;Social Participation;Other   role performance      Patient will benefit from skilled therapeutic intervention in order to improve the following deficits and impairments:           Visit Diagnosis: Lymphedema, not elsewhere classified  Problem List There are no problems to display for this patient.   Loel Dubonnet, MS, OTR/L, [facility] 09/07/19 10:03 AM  [place] [facility] MAIN [facility] SERVICES [address] [place], [place], [address] Phone: [phone]   Fax:  [phone]  Name: VERNITA TAGUE MRN: [id number] Date of Birth: March 10, 1956

## 2019-09-10 ENCOUNTER — Other Ambulatory Visit: Payer: Self-pay

## 2019-09-10 ENCOUNTER — Ambulatory Visit: Payer: Medicare PPO | Admitting: Occupational Therapy

## 2019-09-10 DIAGNOSIS — I890 Lymphedema, not elsewhere classified: Secondary | ICD-10-CM

## 2019-09-10 NOTE — Therapy (Signed)
[place] MAIN [facility] SERVICES [address] [place], [place], [address] Phone: [phone]   Fax:  [phone]  Occupational Therapy Treatment  Patient Details  Name: Nancy Riley MRN: [id number] Date of Birth: August 29, 1955 Referring Provider (OT): Delight Stare, MD   Encounter Date: 09/10/2019  OT End of Session - 09/10/19 1028    Visit Number  5    Number of Visits  36    Date for OT Re-Evaluation  11/21/19    OT Start Time  0909    OT Stop Time  1010    OT Time Calculation (min)  61 min    Activity Tolerance  Patient tolerated treatment well;No increased pain    Behavior During Therapy  WFL for tasks assessed/performed       Past Medical History:  Diagnosis Date  . Diabetes mellitus without complication ([place])   . Hypercholesteremia   . Hypertension     Past Surgical History:  Procedure Laterality Date  . BREAST EXCISIONAL BIOPSY Left 1970   negative  . COLONOSCOPY      There were no vitals filed for this visit.  Subjective Assessment - 09/10/19 0915    Subjective   [name] presents for OT visit 5/36 to treat BLE lymphedema. Pt presents with LLE gradient compression wraps in place.  Pt reports /10 pain in knees, and back rated 3/10. Pt has no questions or new concerns re LE.    Pertinent History  Spondylodiscitis, BLE lymphedema, HTN, DM, neuropathy, DJD, arthritis, sciatica,  chronic back pain, morbid obesity, (BMI 50-59.9)    Limitations  Difficulty walking, impaired transfers/functional mobility, chronic back DJD and knee  OA pain , chronic leg swelling decreased strength, impaired balance, impaired sensation (neuropathy in feet), increased infection risk, increased fall risk,    Repetition  Increases Symptoms    Special Tests  + Stemmer bilaterally    Patient Stated Goals  get swelling back down and replace worn compression stockings to limit LE progression    Pain Onset  Other (comment)   chronic                   OT Treatments/Exercises (OP) - 09/10/19 0001      ADLs   ADL Education Given  Yes      Manual Therapy   Manual Therapy  Edema management;Manual Lymphatic Drainage (MLD);Compression Bandaging    Edema Management  skin care with low ph castor oil throughout LLE MLD to improve hydration and flexibility    Manual Lymphatic Drainage (MLD)  RLE MLD as established , including short neck sequence, deep diaphragmatic breathing, functional L inguinal LN and lLE segments from proximal to distal. Good tolerance.    Compression Bandaging  Max A to don existing compression garment on LLE today .Multilayer compression wraps to RLE using gradient techniques. Excluded foot today on trial basis in effort to reduce falls risk while shoes employed for   compression. Used  one each 8.10  and 12 cm wide short stretch wraps staring with 8 cm herringbone pattern to eastablish gradient, 10, 12 1ne 15 cm wraps applied circumferentially -all over single layer Rosidal foam and stockinett from ankle to tibial tuberosity.             OT Education - 09/10/19 1026    Education Details  Encouraged Pt to check in with spine doctor at [facility] to follow up with plan of care they initiated before spinal infection interupted Rx. Provided hand  out for bariatric specialist at [facility] who understands lymphedema and obesity. Encouraged Pt to consider consult. Continued skilled Pt/caregiver education  And LE ADL training throughout visit for lymphedema self care/ home program, including compression wrapping, compression garment and device wear/care, lymphatic pumping ther ex, simple self-MLD, and skin care. Discussed progress towards goals.    Person(s) Educated  Patient    Methods  Explanation;Demonstration;Handout    Comprehension  Verbalized understanding;Need further instruction;Returned demonstration          OT Long Term Goals - 08/24/19 1124      OT LONG TERM GOAL #1   Title  Pt will demonstrate  understanding of lymphedema precautions and signs/ symptoms of cellulitisby identifying 6 items for each using printed Lymphedema Workbook for reference PRN (modified independence) to limit LE progression and infection risk.    Baseline  minA    Time  4    Period  Days    Status  New      OT LONG TERM GOAL #2   Title  Pt will require Max assist from caregiver who will be able to apply multilayer, gradient compression wraps using correct techniques after skilled training to reduce limb volume and limity LE progression.    Baseline  dependent    Time  4    Period  Days    Status  New      OT LONG TERM GOAL #3   Title  Pt will achieve 10% limb volume reductions  bilaterally from ankle to tibial tuberosity during Intensive Phase CDT to return limbs to typical size and shape ,. reduce infection risk and improve basic andf instrumental ALDs performance.    Baseline  dependent  01/04/19: achieved 12/31/18 for RLE with 26.73% limb volume reduction below the knee.    Time  12    Period  Days    Status  New      OT LONG TERM GOAL #4   Title  Pt will consistently perform all LE self -care home protocols ( simple self MLD, skin care, therapeutic exercise, compression therapies) daily  with max CG assistance to to achieve max edema reduction, to enanble functional improvements,  including fitting preferred street shoes and standard sized clothing, and to achieve increased AROM and reduced falls risk.    Baseline  Max A    Time  12    Period  Weeks    Status  New    Target Date  11/22/19      OT LONG TERM GOAL #5   Title  Patient will schedule a consult this bariatric medicine specialist Shanda Howells, MD- [facility]) to demonstrate understanding of  the importance of weight management,to achieve and sustain optimal control of limb swelling over time.    Baseline  Max A    Time  12    Period  Weeks    Status  New            Plan - 09/10/19 1029    Clinical Impression Statement  LLE swelling is  decreased such that Pt is able to fit existing custom compression knee high on that leg after Intensive Phase CDT. LLE garment appears mildly worn , but still in useable condition. It also appears a bit large on Pt today. We'll consider replacing these garments if volumetric  measurements reveal need a little farther along in Rx. Skin of LLE remains in excellent condition. Pt pleased with outcome thus far. Shifted CDT to RLE today      .  Pt tolerated RLE MLD and compression wrapping without increased pain. Good session today. Pt making rapid progress. Cont as per POC.    Occupational performance deficits (Please refer to evaluation for details):  ADL's;Work;IADL's;Rest and Sleep;Leisure;Social Participation;Other   role performance      Patient will benefit from skilled therapeutic intervention in order to improve the following deficits and impairments:           Visit Diagnosis: Lymphedema, not elsewhere classified    Problem List There are no problems to display for this patient.  Loel Dubonnet, MS, OTR/L, [facility] 09/10/19 10:34 AM   [place] [facility] MAIN [facility] SERVICES [address] [place], [place], [address] Phone: [phone]   Fax:  [phone]  Name: OTTIE NEGLIA MRN: [id number] Date of Birth: Feb 05, 1956

## 2019-09-14 ENCOUNTER — Other Ambulatory Visit: Payer: Self-pay

## 2019-09-14 ENCOUNTER — Ambulatory Visit: Payer: Medicare PPO | Admitting: Occupational Therapy

## 2019-09-14 DIAGNOSIS — I890 Lymphedema, not elsewhere classified: Secondary | ICD-10-CM

## 2019-09-14 NOTE — Therapy (Signed)
[place] [facility] MAIN [facility] SERVICES [address] [place], [place], [address] Phone: [phone]   Fax:  [phone]  Occupational Therapy Treatment  Patient Details  Name: Nancy Riley MRN: [id number] Date of Birth: [birth year] Referring Provider (OT): Darreld Mclean, MD   Encounter Date: 09/14/2019  OT End of Session - 09/14/19 1109    Visit Number  6    Number of Visits  36    Date for OT Re-Evaluation  11/21/19    OT Start Time  0905    OT Stop Time  1003    OT Time Calculation (min)  58 min    Activity Tolerance  Patient tolerated treatment well;No increased pain    Behavior During Therapy  WFL for tasks assessed/performed       Past Medical History:  Diagnosis Date  . Diabetes mellitus without complication (HCC)   . Hypercholesteremia   . Hypertension     Past Surgical History:  Procedure Laterality Date  . BREAST EXCISIONAL BIOPSY Left 1970   negative  . COLONOSCOPY      There were no vitals filed for this visit.  Subjective Assessment - 09/14/19 0908    Subjective   Nancy Riley presents for OT visit 6/36 to treat BLE lymphedema. Pt presents with RLE gradient compression wraps and L custom knee high in place.  Pt reports 5/10 back pain this morning. Ptreports she's been having more difficulty recently lifting legs to climb steps into her home. "The pain in my thighs is about 50/10 when I try to go up steps."    Pertinent History  Spondylodiscitis, BLE lymphedema, HTN, DM, neuropathy, DJD, arthritis, sciatica,  chronic back pain, morbid obesity, (BMI 50-59.9)    Limitations  Difficulty walking, impaired transfers/functional mobility, chronic back DJD and knee  OA pain , chronic leg swelling decreased strength, impaired balance, impaired sensation (neuropathy in feet), increased infection risk, increased fall risk,    Repetition  Increases Symptoms    Special Tests  + Stemmer bilaterally    Patient Stated Goals  get swelling back  down and replace worn compression stockings to limit LE progression    Pain Onset  Other (comment)   chronic                  OT Treatments/Exercises (OP) - 09/14/19 0001      ADLs   ADL Education Given  Yes      Manual Therapy   Manual Therapy  Edema management;Compression Bandaging;Manual Lymphatic Drainage (MLD)    Edema Management  skin care with low ph castor oil throughout LLE MLD to improve hydration and flexibility    Manual Lymphatic Drainage (MLD)  RLE MLD as established , including short neck sequence, deep diaphragmatic breathing, functional L inguinal LN and lLE segments from proximal to distal. Good tolerance.    Compression Bandaging  Max A to don shoes after manual therapy.             OT Education - 09/14/19 1108    Education Details  Continued skilled Pt/caregiver education  And LE ADL training throughout visit for lymphedema self care/ home program, including compression wrapping, compression garment and device wear/care, lymphatic pumping ther ex, simple self-MLD, and skin care. Discussed progress towards goals.    Person(s) Educated  Patient    Methods  Explanation;Demonstration    Comprehension  Verbalized understanding;Need further instruction;Returned demonstration          OT Long Term Goals -  08/24/19 1124      OT LONG TERM GOAL #1   Title  Pt will demonstrate understanding of lymphedema precautions and signs/ symptoms of cellulitisby identifying 6 items for each using printed Lymphedema Workbook for reference PRN (modified independence) to limit LE progression and infection risk.    Baseline  minA    Time  4    Period  Days    Status  New      OT LONG TERM GOAL #2   Title  Pt will require Max assist from caregiver who will be able to apply multilayer, gradient compression wraps using correct techniques after skilled training to reduce limb volume and limity LE progression.    Baseline  dependent    Time  4    Period  Days     Status  New      OT LONG TERM GOAL #3   Title  Pt will achieve 10% limb volume reductions  bilaterally from ankle to tibial tuberosity during Intensive Phase CDT to return limbs to typical size and shape ,. reduce infection risk and improve basic andf instrumental ALDs performance.    Baseline  dependent  01/04/19: achieved 12/31/18 for RLE with 26.73% limb volume reduction below the knee.    Time  12    Period  Days    Status  New      OT LONG TERM GOAL #4   Title  Pt will consistently perform all LE self -care home protocols ( simple self MLD, skin care, therapeutic exercise, compression therapies) daily  with max CG assistance to to achieve max edema reduction, to enanble functional improvements,  including fitting preferred street shoes and standard sized clothing, and to achieve increased AROM and reduced falls risk.    Baseline  Max A    Time  12    Period  Weeks    Status  New    Target Date  11/22/19      OT LONG TERM GOAL #5   Title  Patient will schedule a consult this bariatric medicine specialist Shanda Howells, MD- [facility]) to demonstrate understanding of  the importance of weight management,to achieve and sustain optimal control of limb swelling over time.    Baseline  Max A    Time  12    Period  Weeks    Status  New            Plan - 09/14/19 1111    Clinical Impression Statement  RLE Limb volume below the knee is reduced slightly since last visit. Pt tolerated MLD w/ fibrosis techniques at R ankle, skin care and gradient compression wraps to RLE without increased pain. Ongoing arthritis pain in back, hips and knees, coupled with obesity and lymphedema continue to present significant obstacles to Pt's ability to ambulate functionally and to perform transfers. Decreasing mobility and activity level contributing to weight gain is also significantly impacting lower extremity lymphedema. Pt and I discussed need for medical help with weight loss to to reduce these obstacles and  worsening functional performance. Cont as per POC.    Occupational performance deficits (Please refer to evaluation for details):  ADL's;Work;IADL's;Rest and Sleep;Leisure;Social Participation;Other   role performance      Patient will benefit from skilled therapeutic intervention in order to improve the following deficits and impairments:           Visit Diagnosis: Lymphedema, not elsewhere classified    Problem List There are no problems to display for this patient.  Andrey Spearman, MS, OTR/L, [facility] 09/14/19 11:18 AM  [place] MAIN [facility] SERVICES [address] [place], [place], [address] Phone: [phone]   Fax:  [phone]  Name: Nancy Riley MRN: [id number] Date of Birth: June 20, 1955

## 2019-09-16 ENCOUNTER — Ambulatory Visit: Payer: Medicare PPO | Admitting: Occupational Therapy

## 2019-09-16 ENCOUNTER — Other Ambulatory Visit: Payer: Self-pay

## 2019-09-16 DIAGNOSIS — I890 Lymphedema, not elsewhere classified: Secondary | ICD-10-CM | POA: Diagnosis not present

## 2019-09-16 NOTE — Therapy (Signed)
[place] MAIN [facility] SERVICES [address] [place], [place], [address] Phone: [phone]   Fax:  [phone]  Occupational Therapy Treatment  Patient Details  Name: Nancy Riley MRN: [id number] Date of Birth: 11/04/1955 Referring Provider (OT): Delight Stare, MD   Encounter Date: 09/16/2019  OT End of Session - 09/16/19 1311    Visit Number  7    Number of Visits  36    Date for OT Re-Evaluation  11/21/19    OT Start Time  0904    OT Stop Time  1000    OT Time Calculation (min)  56 min    Activity Tolerance  Patient tolerated treatment well;No increased pain    Behavior During Therapy  WFL for tasks assessed/performed       Past Medical History:  Diagnosis Date  . Diabetes mellitus without complication ([place])   . Hypercholesteremia   . Hypertension     Past Surgical History:  Procedure Laterality Date  . BREAST EXCISIONAL BIOPSY Left 1970   negative  . COLONOSCOPY      There were no vitals filed for this visit.  Subjective Assessment - 09/16/19 0909    Subjective   Nancy Riley presents for OT visit 7/36 to treat BLE lymphedema. Pt presents with RLE gradient compression wraps and L custom knee high in place.  Pt reports 3/10 B knee  pain this morning. and same for back pain. Pt reporting difficulty flexing hip to ascend steps into her home due to R groin pain.    Pertinent History  Spondylodiscitis, BLE lymphedema, HTN, DM, neuropathy, DJD, arthritis, sciatica,  chronic back pain, morbid obesity, (BMI 50-59.9)    Limitations  Difficulty walking, impaired transfers/functional mobility, chronic back DJD and knee  OA pain , chronic leg swelling decreased strength, impaired balance, impaired sensation (neuropathy in feet), increased infection risk, increased fall risk,    Repetition  Increases Symptoms    Special Tests  + Stemmer bilaterally    Patient Stated Goals  get swelling back down and replace worn compression stockings to  limit LE progression    Pain Onset  Other (comment)   chronic                  OT Treatments/Exercises (OP) - 09/16/19 0001      ADLs   ADL Education Given  Yes      Manual Therapy   Manual Therapy  Edema management;Compression Bandaging;Manual Lymphatic Drainage (MLD)    Edema Management  skin care with low ph castor oil throughout LLE MLD to improve hydration and flexibility    Manual Lymphatic Drainage (MLD)  RLE MLD as established , including short neck sequence, deep diaphragmatic breathing, functional L inguinal LN and lLE segments from proximal to distal. Good tolerance.    Compression Bandaging  Max A to don shoes after manual therapy.             OT Education - 09/16/19 1311    Education Details  Continued skilled Pt/caregiver education  And LE ADL training throughout visit for lymphedema self care/ home program, including compression wrapping, compression garment and device wear/care, lymphatic pumping ther ex, simple self-MLD, and skin care. Discussed progress towards goals.    Person(s) Educated  Patient    Methods  Explanation;Demonstration    Comprehension  Verbalized understanding;Need further instruction;Returned demonstration          OT Long Term Goals - 08/24/19 1124  OT LONG TERM GOAL #1   Title  Pt will demonstrate understanding of lymphedema precautions and signs/ symptoms of cellulitisby identifying 6 items for each using printed Lymphedema Workbook for reference PRN (modified independence) to limit LE progression and infection risk.    Baseline  minA    Time  4    Period  Days    Status  New      OT LONG TERM GOAL #2   Title  Pt will require Max assist from caregiver who will be able to apply multilayer, gradient compression wraps using correct techniques after skilled training to reduce limb volume and limity LE progression.    Baseline  dependent    Time  4    Period  Days    Status  New      OT LONG TERM GOAL #3   Title   Pt will achieve 10% limb volume reductions  bilaterally from ankle to tibial tuberosity during Intensive Phase CDT to return limbs to typical size and shape ,. reduce infection risk and improve basic andf instrumental ALDs performance.    Baseline  dependent  01/04/19: achieved 12/31/18 for RLE with 26.73% limb volume reduction below the knee.    Time  12    Period  Days    Status  New      OT LONG TERM GOAL #4   Title  Pt will consistently perform all LE self -care home protocols ( simple self MLD, skin care, therapeutic exercise, compression therapies) daily  with max CG assistance to to achieve max edema reduction, to enanble functional improvements,  including fitting preferred street shoes and standard sized clothing, and to achieve increased AROM and reduced falls risk.    Baseline  Max A    Time  12    Period  Weeks    Status  New    Target Date  11/22/19      OT LONG TERM GOAL #5   Title  Patient will schedule a consult this bariatric medicine specialist Shanda Howells, MD- [facility]) to demonstrate understanding of  the importance of weight management,to achieve and sustain optimal control of limb swelling over time.    Baseline  Max A    Time  12    Period  Weeks    Status  New            Plan - 09/16/19 1311    Clinical Impression Statement  LLE is obviously more swollen today   in existing, Elvarex classic ccl 2 knee high. This garment is not providing adequate containment or compression. Pt agrees with plan to increase compression to ccl 3 , or 3 F with next pr of custom stockings. She feels confident she can don/ doff them with assistive devices already in use and extra time (modified independence). Taught patient supine ther ex for groin pain, including butterfly stretch for adductors, straight leg raises and hamstring stretch. Good return. Pt tolerated MLD and compression wrap to RLE without increased pain. Cont as per POC.    Occupational performance deficits (Please refer to  evaluation for details):  ADL's;Work;IADL's;Rest and Sleep;Leisure;Social Participation;Other   role performance      Patient will benefit from skilled therapeutic intervention in order to improve the following deficits and impairments:           Visit Diagnosis: Lymphedema, not elsewhere classified    Problem List There are no problems to display for this patient.   Loel Dubonnet, MS, OTR/L, CLT-LANA  09/16/19 1:23 PM  [place] [facility] MAIN [facility] SERVICES [address] [place], [place], [address] Phone: [phone]   Fax:  [phone]  Name: Nancy Riley MRN: [id number] Date of Birth: 1955-10-07

## 2019-09-20 ENCOUNTER — Ambulatory Visit: Payer: Medicare PPO | Admitting: Occupational Therapy

## 2019-09-20 ENCOUNTER — Other Ambulatory Visit: Payer: Self-pay

## 2019-09-20 DIAGNOSIS — I890 Lymphedema, not elsewhere classified: Secondary | ICD-10-CM

## 2019-09-20 NOTE — Patient Instructions (Signed)
Pt instructed to remove BLE compression wraps and call her PCP if she experiences onset of acute pain and / or swelling, and/ or atypical SOB, especially at rest.

## 2019-09-21 NOTE — Therapy (Signed)
[place] MAIN [facility] SERVICES [address] [place], [place], [address] Phone: [phone]   Fax:  [phone]  Occupational Therapy Treatment  Patient Details  Name: Nancy Riley MRN: [id number] Date of Birth: 1955-10-27 Referring Provider (OT): Delight Stare, MD   Encounter Date: 09/20/2019  OT End of Session - 09/20/19 1253    Visit Number  8    Number of Visits  36    Date for OT Re-Evaluation  11/21/19    OT Start Time  1000    OT Stop Time  1100    OT Time Calculation (min)  60 min    Activity Tolerance  Patient tolerated treatment well;No increased pain    Behavior During Therapy  WFL for tasks assessed/performed       Past Medical History:  Diagnosis Date  . Diabetes mellitus without complication ([place])   . Hypercholesteremia   . Hypertension     Past Surgical History:  Procedure Laterality Date  . BREAST EXCISIONAL BIOPSY Left 1970   negative  . COLONOSCOPY      There were no vitals filed for this visit.  Subjective Assessment - 09/20/19 1014    Subjective   Nancy Riley presents for OT visit 9/36 to treat BLE lymphedema. Pt presents without gradient compression wraps in place. Pt reports 10/10 knee pain and 5/10 back pain this morning. Pt presents with gradient compression wraps on RLE and existing compression    stocking on LLE.    Pertinent History  Spondylodiscitis, BLE lymphedema, HTN, DM, neuropathy, DJD, arthritis, sciatica,  chronic back pain, morbid obesity, (BMI 50-59.9)    Limitations  Difficulty walking, impaired transfers/functional mobility, chronic back DJD and knee  OA pain , chronic leg swelling decreased strength, impaired balance, impaired sensation (neuropathy in feet), increased infection risk, increased fall risk,    Repetition  Increases Symptoms    Special Tests  + Stemmer bilaterally    Patient Stated Goals  get swelling back down and replace worn compression stockings to limit LE progression     Pain Onset  Other (comment)   chronic         LYMPHEDEMA/ONCOLOGY QUESTIONNAIRE - 09/20/19 1254      Right Lower Extremity Lymphedema   Other  RLE A-D limb volume measures 7156.77 ml.    Other  RLE A-D limb volume is decreased by 13.86% since commencing CDT..                      OT Education - 09/20/19 1246    Education Details  Reviewed negative impact of OA pain on functional ambulation and mobility, obesity and lymphedema progression. Discussed feedback loops Encouraged Pt again to seek medical assistance with weight lossv referring again to Enrigue Catena, MD at [facility] who may be abe to help her break the cycles..Contact information provided previously.    Person(s) Educated  Patient    Methods  Explanation;Demonstration    Comprehension  Verbalized understanding;Need further instruction;Returned demonstration          OT Long Term Goals - 08/24/19 1124      OT LONG TERM GOAL #1   Title  Pt will demonstrate understanding of lymphedema precautions and signs/ symptoms of cellulitisby identifying 6 items for each using printed Lymphedema Workbook for reference PRN (modified independence) to limit LE progression and infection risk.    Baseline  minA    Time  4    Period  Days  Status  New      OT LONG TERM GOAL #2   Title  Pt will require Max assist from caregiver who will be able to apply multilayer, gradient compression wraps using correct techniques after skilled training to reduce limb volume and limity LE progression.    Baseline  dependent    Time  4    Period  Days    Status  New      OT LONG TERM GOAL #3   Title  Pt will achieve 10% limb volume reductions  bilaterally from ankle to tibial tuberosity during Intensive Phase CDT to return limbs to typical size and shape ,. reduce infection risk and improve basic andf instrumental ALDs performance.    Baseline  dependent  01/04/19: achieved 12/31/18 for RLE with 26.73% limb volume reduction below the knee.     Time  12    Period  Days    Status  New      OT LONG TERM GOAL #4   Title  Pt will consistently perform all LE self -care home protocols ( simple self MLD, skin care, therapeutic exercise, compression therapies) daily  with max CG assistance to to achieve max edema reduction, to enanble functional improvements,  including fitting preferred street shoes and standard sized clothing, and to achieve increased AROM and reduced falls risk.    Baseline  Max A    Time  12    Period  Weeks    Status  New    Target Date  11/22/19      OT LONG TERM GOAL #5   Title  Patient will schedule a consult this bariatric medicine specialist Enrigue Catena, MD- [facility]) to demonstrate understanding of  the importance of weight management,to achieve and sustain optimal control of limb swelling over time.    Baseline  Max A    Time  12    Period  Weeks    Status  New            Plan - 09/20/19 1256    Clinical Impression Statement  Completed comparative limb volumetrics on RLE this morning. RLE A-D limb volume is decreased by 13.86% since commencing CDT. 10%   reduction goal met for RLE. We did not measure LLE for compression garments this morning as existing ccl 3, flatknit, custom   stocking no longer contains swelling in that limb. We did measure for custom stocking on R and increased ccl to Elvarex 71F. This is a denser knit   offering increased containment. We'll try this option first before increas    Occupational performance deficits (Please refer to evaluation for details):  ADL's;Work;IADL's;Rest and Sleep;Leisure;Social Participation;Other   role performance      Patient will benefit from skilled therapeutic intervention in order to improve the following deficits and impairments:           Visit Diagnosis: Lymphedema, not elsewhere classified    Problem List There are no problems to display for this patient.   Andrey Spearman, MS, OTR/L, [facility] 09/21/19 10:48 AM  [place] MAIN [facility] SERVICES [address] [place], [place], [address] Phone: [phone]   Fax:  [phone]  Name: Nancy Riley MRN: [id number] Date of Birth: 29-Jul-1955

## 2019-09-22 ENCOUNTER — Ambulatory Visit: Payer: Medicare PPO | Admitting: Occupational Therapy

## 2019-09-22 ENCOUNTER — Other Ambulatory Visit: Payer: Self-pay

## 2019-09-22 DIAGNOSIS — I890 Lymphedema, not elsewhere classified: Secondary | ICD-10-CM

## 2019-09-22 NOTE — Therapy (Signed)
[place] MAIN [facility] SERVICES [address] [place], [place], [address] Phone: [phone]   Fax:  [phone]  Occupational Therapy Treatment  Patient Details  Name: Nancy Riley MRN: [id number] Date of Birth: 1955-06-25 Referring Provider (OT): Delight Stare, MD   Encounter Date: 09/22/2019  OT End of Session - 09/22/19 1240    Visit Number  9    Number of Visits  36    Date for OT Re-Evaluation  11/21/19    OT Start Time  1005    OT Stop Time  1100    OT Time Calculation (min)  55 min    Activity Tolerance  Patient tolerated treatment well;No increased pain;Patient limited by pain    Behavior During Therapy  [facility] for tasks assessed/performed       Past Medical History:  Diagnosis Date  . Diabetes mellitus without complication ([place])   . Hypercholesteremia   . Hypertension     Past Surgical History:  Procedure Laterality Date  . BREAST EXCISIONAL BIOPSY Left 1970   negative  . COLONOSCOPY      There were no vitals filed for this visit.  Subjective Assessment - 09/22/19 1242    Subjective   Nancy Riley presents for OT visit 8/36 to treat BLE lymphedema. Pt presents with knee length compression wraps in place bilaterally. Pt reports no difficulty  tolerating wraps   bilaterally over interval. Pt reorts again today increasing difficulty lifting R leg from hip flexors. "My nephew has to come help lift my leg on the steps. Discussed possible worsening spinal stenosis which may be contributing to decreasing hip strength and worsening functional mobility and ambulation. Strongly encouraged Pt to schedule visit with spine specialist she saw at [facility], but has not followed up with since she was hospitalized for spinal osteomyelitis. Pt agreed to call MD's offie and request a visit ASAP.    Pertinent History  Spondylodiscitis, BLE lymphedema, HTN, DM, neuropathy, DJD, arthritis, sciatica,  chronic back pain, morbid obesity, (BMI 50-59.9)    Limitations  Difficulty walking, impaired transfers/functional mobility, chronic back DJD and knee  OA pain , chronic leg swelling decreased strength, impaired balance, impaired sensation (neuropathy in feet), increased infection risk, increased fall risk,    Repetition  Increases Symptoms    Special Tests  + Stemmer bilaterally    Patient Stated Goals  get swelling back down and replace worn compression stockings to limit LE progression    Pain Onset  Other (comment)   chronic                  OT Treatments/Exercises (OP) - 09/22/19 0001      ADLs   ADL Education Given  Yes      Manual Therapy   Manual Therapy  Edema management    Edema Management  skin care with low ph castor oil throughout LLE MLD to improve hydration and flexibility    Manual Lymphatic Drainage (MLD)  RLE MLD as established , including short neck sequence, deep diaphragmatic breathing, functional L inguinal LN and lLE segments from proximal to distal. Good tolerance.    Compression Bandaging  Compression wraps to BLE as established.             OT Education - 09/22/19 1252    Education Details  Brief education on possible pathological  impact of spinal stenosis on spinal cord and functional ambulation and mobility. Strongly encouraged Pt to follow up with her spine physician for  assessment of progressive muscle weakness ibn L hip and thigh over the last 2 weeks.    Person(s) Educated  Patient    Methods  Explanation;Demonstration    Comprehension  Verbalized understanding;Need further instruction;Returned demonstration          OT Long Term Goals - 08/24/19 1124      OT LONG TERM GOAL #1   Title  Pt will demonstrate understanding of lymphedema precautions and signs/ symptoms of cellulitisby identifying 6 items for each using printed Lymphedema Workbook for reference PRN (modified independence) to limit LE progression and infection risk.    Baseline  minA    Time  4    Period  Days     Status  New      OT LONG TERM GOAL #2   Title  Pt will require Max assist from caregiver who will be able to apply multilayer, gradient compression wraps using correct techniques after skilled training to reduce limb volume and limity LE progression.    Baseline  dependent    Time  4    Period  Days    Status  New      OT LONG TERM GOAL #3   Title  Pt will achieve 10% limb volume reductions  bilaterally from ankle to tibial tuberosity during Intensive Phase CDT to return limbs to typical size and shape ,. reduce infection risk and improve basic andf instrumental ALDs performance.    Baseline  dependent  01/04/19: achieved 12/31/18 for RLE with 26.73% limb volume reduction below the knee.    Time  12    Period  Days    Status  New      OT LONG TERM GOAL #4   Title  Pt will consistently perform all LE self -care home protocols ( simple self MLD, skin care, therapeutic exercise, compression therapies) daily  with max CG assistance to to achieve max edema reduction, to enanble functional improvements,  including fitting preferred street shoes and standard sized clothing, and to achieve increased AROM and reduced falls risk.    Baseline  Max A    Time  12    Period  Weeks    Status  New    Target Date  11/22/19      OT LONG TERM GOAL #5   Title  Patient will schedule a consult this bariatric medicine specialist Shanda Howells, MD- [facility]) to demonstrate understanding of  the importance of weight management,to achieve and sustain optimal control of limb swelling over time.    Baseline  Max A    Time  12    Period  Weeks    Status  New            Plan - 09/22/19 1259    Clinical Impression Statement  Good tolerance for B       cmpression wraps by report. LLE re wrapped as swelling has not reduced enough to complete garment measurements yet. RLE continues to improve. Measurements for custom compression for RLE have already been completed and are pending submission. We'll try to sumbit next  week. Ptstrongly encouraged to call her spine doctor's office to schedule return visit ASAP due to progressing L hip muscle weakening.    Occupational performance deficits (Please refer to evaluation for details):  ADL's;Work;IADL's;Rest and Sleep;Leisure;Social Participation;Other   role performance      Patient will benefit from skilled therapeutic intervention in order to improve the following deficits and impairments:  Visit Diagnosis: Lymphedema, not elsewhere classified    Problem List There are no problems to display for this patient.   Loel Dubonnet, MS, OTR/L, [facility] 09/22/19 1:03 PM  [facility] MAIN [facility] SERVICES [address] [place], [place], [address] Phone: [phone]   Fax:  [phone]  Name: Nancy Riley MRN: [id number] Date of Birth: 27-Jun-1955

## 2019-09-26 ENCOUNTER — Other Ambulatory Visit: Payer: Self-pay

## 2019-09-26 DIAGNOSIS — Z20822 Contact with and (suspected) exposure to covid-19: Secondary | ICD-10-CM | POA: Diagnosis not present

## 2019-09-26 DIAGNOSIS — I10 Essential (primary) hypertension: Secondary | ICD-10-CM | POA: Insufficient documentation

## 2019-09-26 DIAGNOSIS — G629 Polyneuropathy, unspecified: Secondary | ICD-10-CM | POA: Insufficient documentation

## 2019-09-26 DIAGNOSIS — Z79899 Other long term (current) drug therapy: Secondary | ICD-10-CM | POA: Insufficient documentation

## 2019-09-26 DIAGNOSIS — M1990 Unspecified osteoarthritis, unspecified site: Secondary | ICD-10-CM | POA: Diagnosis not present

## 2019-09-26 DIAGNOSIS — M4644 Discitis, unspecified, thoracic region: Secondary | ICD-10-CM | POA: Diagnosis not present

## 2019-09-26 DIAGNOSIS — M545 Low back pain: Secondary | ICD-10-CM | POA: Diagnosis present

## 2019-09-26 DIAGNOSIS — M5432 Sciatica, left side: Secondary | ICD-10-CM | POA: Diagnosis not present

## 2019-09-26 NOTE — ED Notes (Signed)
Per [facility] pt has spinal issues and was changing clothes while seated on the toilet this evening and is experiencing lower left sharp pain. 10/10 pain per EMS other VS WDL and CBG 107. Pt is in NAD.

## 2019-09-26 NOTE — ED Triage Notes (Signed)
Arrives via EMS from home. Pt reports that she was changing clothes and suddenly experienced a pain that originated at the left lower back and "shot to the top of my butt." Pt reports pain is consistent in that area, rated 10/10. Pt also states that she has had a pain in the right inner thigh for the last 3 weeks. Pt is in no apparent distress at the time.

## 2019-09-27 ENCOUNTER — Encounter: Payer: Self-pay | Admitting: Emergency Medicine

## 2019-09-27 ENCOUNTER — Emergency Department: Payer: Medicare PPO

## 2019-09-27 ENCOUNTER — Emergency Department
Admission: EM | Admit: 2019-09-27 | Discharge: 2019-09-27 | Disposition: A | Discharge status: Home / Self Care | Payer: Medicare PPO | Attending: Emergency Medicine | Admitting: Emergency Medicine

## 2019-09-27 ENCOUNTER — Ambulatory Visit: Payer: Medicare PPO | Admitting: Occupational Therapy

## 2019-09-27 DIAGNOSIS — M4644 Discitis, unspecified, thoracic region: Secondary | ICD-10-CM

## 2019-09-27 DIAGNOSIS — M5432 Sciatica, left side: Secondary | ICD-10-CM

## 2019-09-27 HISTORY — DX: Plantar fascial fibromatosis: M72.2

## 2019-09-27 HISTORY — DX: Cutaneous abscess, unspecified: L02.91

## 2019-09-27 HISTORY — DX: Sciatica, unspecified side: M54.30

## 2019-09-27 HISTORY — DX: Unspecified osteoarthritis, unspecified site: M19.90

## 2019-09-27 HISTORY — DX: Discitis, unspecified, site unspecified: M46.40

## 2019-09-27 HISTORY — DX: Gastro-esophageal reflux disease without esophagitis: K21.9

## 2019-09-27 HISTORY — DX: Morbid (severe) obesity due to excess calories: E66.01

## 2019-09-27 HISTORY — DX: Polyneuropathy, unspecified: G62.9

## 2019-09-27 LAB — BASIC METABOLIC PANEL
Anion gap: 9 (ref 5–15)
BUN: 18 mg/dL (ref 8–23)
CO2: 26 mmol/L (ref 22–32)
Calcium: 9 mg/dL (ref 8.9–10.3)
Chloride: 100 mmol/L (ref 98–111)
Creatinine, Ser: 1.01 mg/dL — ABNORMAL HIGH (ref 0.44–1.00)
GFR calc Af Amer: >60 mL/min (ref >60)
GFR calc non Af Amer: 60 mL/min — ABNORMAL LOW (ref >60)
Glucose, Bld: 128 mg/dL — ABNORMAL HIGH (ref 70–99)
Potassium: 3.9 mmol/L (ref 3.5–5.1)
Sodium: 135 mmol/L (ref 135–145)

## 2019-09-27 LAB — CBC WITH DIFFERENTIAL/PLATELET
Abs Immature Granulocytes: 0.04 10*3/uL (ref 0.00–0.07)
Basophils Absolute: 0.1 10*3/uL (ref 0.0–0.1)
Basophils Relative: 1 %
Eosinophils Absolute: 0.2 10*3/uL (ref 0.0–0.5)
Eosinophils Relative: 4 %
HCT: 35.3 % — ABNORMAL LOW (ref 36.0–46.0)
Hemoglobin: 11.3 g/dL — ABNORMAL LOW (ref 12.0–15.0)
Immature Granulocytes: 1 %
Lymphocytes Relative: 34 %
Lymphs Abs: 1.9 10*3/uL (ref 0.7–4.0)
MCH: 27.2 pg (ref 26.0–34.0)
MCHC: 32 g/dL (ref 30.0–36.0)
MCV: 85.1 fL (ref 80.0–100.0)
Monocytes Absolute: 0.5 10*3/uL (ref 0.1–1.0)
Monocytes Relative: 8 %
Neutro Abs: 2.9 10*3/uL (ref 1.7–7.7)
Neutrophils Relative %: 52 %
Platelets: 350 10*3/uL (ref 150–400)
RBC: 4.15 MIL/uL (ref 3.87–5.11)
RDW: 13.8 % (ref 11.5–15.5)
WBC: 5.5 10*3/uL (ref 4.0–10.5)
nRBC: 0 % (ref 0.0–0.2)

## 2019-09-27 LAB — C-REACTIVE PROTEIN: CRP: 1 mg/dL — ABNORMAL HIGH (ref <1.0)

## 2019-09-27 LAB — RESPIRATORY PANEL BY RT PCR (FLU A&B, COVID)
Influenza A by PCR: NEGATIVE
Influenza B by PCR: NEGATIVE
SARS Coronavirus 2 by RT PCR: NEGATIVE

## 2019-09-27 LAB — SEDIMENTATION RATE: Sed Rate: 52 mm/hr — ABNORMAL HIGH (ref 0–30)

## 2019-09-27 MED ORDER — OXYCODONE HCL 5 MG PO TABS: 5.0000 mg | ORAL_TABLET | Freq: Three times a day (TID) | ORAL | 0 refills | Status: AC | PRN

## 2019-09-27 MED ORDER — OXYCODONE-ACETAMINOPHEN 5-325 MG PO TABS
2.0000 | ORAL_TABLET | Freq: Once | ORAL | Status: AC
  Administered 2019-09-27: 01:00:00 2 via ORAL
  Filled 2019-09-27: qty 2

## 2019-09-27 MED ORDER — GADOBUTROL 1 MMOL/ML IV SOLN
10.0000 mL | Freq: Once | INTRAVENOUS | Status: AC | PRN
  Administered 2019-09-27: 03:00:00 10 mL via INTRAVENOUS

## 2019-09-27 NOTE — ED Provider Notes (Signed)
7:19 AM Assumed care for off going team.   Blood pressure (!) 105/45, pulse 80, temperature 97.8 F (36.6 C), temperature source Oral, resp. rate 20, height 5\' 3"  (1.6 m), weight 136.1 kg, SpO2 100 %.  See their HPI for full report but in brief Pending NS-Back pain with history of prior discitis and prior rx with antibiotics--> no neuro deficits.  MRI thoracic- concerning for discitis vs degenertive changes. Pending Dr. evaluation.   Dr. Adriana Simas recommended discussing with [facility].   Discussed with Dr.Jaickumar from [facility] neurosurgery who stated that from a neurosurgical perspective patient does not require transfer over to [facility] however the more poor question would be if ID feels like patient needs to be transferred for antibiotics versus following up in clinic.  Will discuss with the [facility] ID team.  Discussed with [facility] hospitalist Dr. [facility].  He stated that usually the ID consult go through the hospitalist for admission.  Discussed that I was not sure if patient requires admission versus following up in ID clinic given she is afebrile with no white count.  He stated that the [facility] hospital is completely full regardless but he was going to try and get me in contact with a ID doctor to discuss if they would recommend admission.  Hospitalist recommended calling [facility] ID clinic at [phone].   10:07 AM called clinic. They said someone would call me back  11:18 AM still have not received call back. Attempted to call again. Dr. [phone] did come by and saw patient and still thinks clear from NS perspective. Thinks pt could follow up with ID clinic.   Discussed with Dr. Adriana Simas from [facility] ID clinic who works with patient's ID doctor Dr. [facility].  I discussed the case with him and he felt comfortable with patient following up in the clinic and did not feel patient needed be admitted for IV antibiotics.  Patient is ambulatory, normal neuro exam has no fever and also prefer to go home.  He stated he would message Dr.  Orson Slick and have them reach out to schedule appointment for her.  Most likely will need to be started on IV antibiotics outpatient.  Discussed with patient she is still comfortable with this plan.  We will give patient a short course of oxycodone and understands that she can split them in half and to try to avoid any falls.  Patient states that she has been ambulatory.  I discussed the provisional nature of ED diagnosis, the treatment so far, the ongoing plan of care, follow up appointments and return precautions with the patient and any family or support people present. They expressed understanding and agreed with the plan, discharged home.               Orson Slick, MD 09/27/19 1159

## 2019-09-27 NOTE — Discharge Instructions (Addendum)
We discussed with neurosurgery who felt like this did not need any neurosurgical intervention.  We also discussed with Dr. Servando Snare from [facility] ID who stated that they would let Dr. Orson Slick know.  They should be calling you to make an appointment.  You will most likely need outpatient antibiotics.  In the meantime I prescribed some pain medicine to help you get to your appointment.  Return to the ER if you develop fevers, worsening pain inability to walk or any other concerns    IMPRESSION: 1. Loss of disc space height, endplate irregularity, with mild marrow edema and enhancement about the T6-7 interspace, likely reflecting discitis/osteomyelitis given provided history. No associated epidural phlegmon or abscess. No paraspinous collections. 2. No other evidence for acute infection within the thoracic or lumbar spine. 3. Multifactorial degenerative changes at L3-4 with resultant severe spinal stenosis. 4. Disc bulging with facet hypertrophy at L4-5 with resultant moderate to severe canal with left worse than right lateral recess stenosis.

## 2019-09-27 NOTE — ED Notes (Signed)
[facility] called for transfer per Dr York Cerise.  Spoke with Denny Peon at transfer center.  Face sheet faxed and Images POWERSHARED by Mindi Junker in radiology

## 2019-09-27 NOTE — ED Notes (Signed)
Patient's breakfast ordered. No other needs expressed at this time.

## 2019-09-27 NOTE — ED Provider Notes (Signed)
[facility] ([facility]) Emergency Department Provider Note  ____________________________________________   First MD Initiated Contact with Patient 09/27/19 0020     (approximate)  I have reviewed the triage vital signs and the nursing notes.   HISTORY  Chief Complaint Leg Pain    HPI Nancy Riley is a 64 y.o. female with medical history as listed below who presents  by EMS for evaluation of acute onset and severe sharp stabbing mid to lower back pain on the left side that radiates "to my butt".  She has had long-term problems with her back that this was a new and acute pain started while she was changing clothes in the bathroom.  She has no new numbness nor tingling although she has a chronic neuropathy.  No new focal neurological deficits.  She reports the pain is severe and sharp and present up higher in her lower back than she has had issues before and again is primarily on the left side.  She has not had any recent bladder incontinence or urinary retention and no trouble with bowel movements.  She denies fever/chills, sore throat, chest pain, shortness of breath, nausea, vomiting, and abdominal pain.        Past Medical History:  Diagnosis Date  . Diabetes mellitus without complication (HCC)   . DJD (degenerative joint disease)   . GERD (gastroesophageal reflux disease)   . Hypercholesteremia   . Hypertension   . Morbid obesity with BMI of 50.0-59.9, adult (HCC)   . Neuropathy   . Phlegmon    ventral epidural phlegmon identified on lumbar spine MRI at [facility] in Nov 2020  . Plantar fasciitis   . Sciatica   . Spondylodiscitis    lumbar spine (Nov 2020 at [facility])    There are no problems to display for this patient.   Past Surgical History:  Procedure Laterality Date  . BREAST EXCISIONAL BIOPSY Left 1970   negative  . COLONOSCOPY      Prior to Admission medications   Medication Sig Start Date End Date Taking? Authorizing Provider  aspirin EC 81 MG  tablet Take 81 mg by mouth daily.    [provider]  celecoxib (CELEBREX) 200 MG capsule Take 200 mg by mouth 2 (two) times daily with a meal.    [provider]  cyclobenzaprine (FLEXERIL) 5 MG tablet Take 5 mg by mouth 2 (two) times daily as needed for muscle spasms.    [provider]  gabapentin (NEURONTIN) 100 MG capsule Take 100 mg by mouth 3 (three) times daily.    [provider]  hydrochlorothiazide (HYDRODIURIL) 25 MG tablet Take 25 mg by mouth daily.    [provider]  HYDROcodone-acetaminophen (NORCO) 5-325 MG tablet Take 1 tablet by mouth every 4 (four) hours as needed for moderate pain. 01/02/19   Evon Slack, PA-C  ketorolac (TORADOL) 10 MG tablet Take 1 tablet (10 mg total) by mouth every 8 (eight) hours. 10/18/18   Menshew, Charlesetta Ivory, PA-C  lisinopril (PRINIVIL,ZESTRIL) 10 MG tablet Take 10 mg by mouth daily.    [provider]  loratadine (CLARITIN) 10 MG tablet Take 10 mg by mouth daily.    [provider]  lovastatin (MEVACOR) 20 MG tablet Take 20 mg by mouth every evening.    [provider]  metFORMIN (GLUCOPHAGE-XR) 500 MG 24 hr tablet Take 500 mg by mouth daily.    [provider]  naproxen sodium (ALEVE) 220 MG tablet Take 220  mg by mouth daily as needed (pain).    [provider]  potassium chloride (MICRO-K) 10 MEQ CR capsule Take 10 mEq by mouth daily.    [provider]  predniSONE (DELTASONE) 10 MG tablet Take 1 tablet (10 mg total) by mouth daily. 6,5,4,3,2,1 six day taper 01/02/19   Evon Slack, PA-C  ranitidine (ZANTAC) 150 MG tablet Take 150 mg by mouth 2 (two) times daily.    [provider]  traMADol (ULTRAM) 50 MG tablet Take 1 tablet (50 mg total) by mouth 2 (two) times daily. 10/18/18   Menshew, Charlesetta Ivory, PA-C    Allergies Patient has no known allergies.  Family History  Problem Relation Age of Onset  . Breast cancer Neg Hx   .  Colon cancer Neg Hx     Social History Social History   Tobacco Use  . Smoking status: Never Smoker  . Smokeless tobacco: Never Used  Substance Use Topics  . Alcohol use: Not Currently  . Drug use: Never    Review of Systems Constitutional: No fever/chills Eyes: No visual changes. ENT: No sore throat. Cardiovascular: Denies chest pain. Respiratory: Denies shortness of breath. Gastrointestinal: No abdominal pain.  No nausea, no vomiting.  No diarrhea.  No constipation. Genitourinary: Negative for dysuria. Musculoskeletal: Acute onset pain in the left mid to lower back radiating down to her buttocks. Integumentary: Negative for rash. Neurological: No acute focal numbness nor weakness.   ____________________________________________   PHYSICAL EXAM:  VITAL SIGNS: ED Triage Vitals  Enc Vitals Group     BP 09/26/19 2113 (!) 148/77     Pulse Rate 09/26/19 2113 87     Resp 09/26/19 2113 20     Temp 09/26/19 2113 97.8 F (36.6 C)     Temp Source 09/26/19 2113 Oral     SpO2 09/26/19 2113 100 %     Weight 09/26/19 2114 136.1 kg (300 lb)     Height 09/26/19 2114 1.6 m (5\' 3" )     Head Circumference --      Peak Flow --      Pain Score 09/26/19 2114 10     Pain Loc --      Pain Edu? --      Excl. in GC? --     Constitutional: Alert and oriented.  Eyes: Conjunctivae are normal.  Head: Atraumatic. Nose: No congestion/rhinnorhea. Mouth/Throat: Patient is wearing a mask. Neck: No stridor.  No meningeal signs.   Cardiovascular: Normal rate, regular rhythm. Good peripheral circulation. Grossly normal heart sounds. Respiratory: Normal respiratory effort.  No retractions. Gastrointestinal: Morbid obesity.  Soft and nontender. No distention.  Musculoskeletal: No lower extremity tenderness nor edema. No gross deformities of extremities.  Patient has some reproducible pain in her back when moving her legs.  Tenderness to palpation on the left side of the lower thoracic/upper  lumbar region.  Examination is somewhat limited by the patient's body habitus. Neurologic:  Normal speech and language. No gross focal neurologic deficits are appreciated.  Skin:  Skin is warm, dry and intact. Psychiatric: Mood and affect are normal. Speech and behavior are normal.  ____________________________________________   LABS (all labs ordered are listed, but only abnormal results are displayed)  Labs Reviewed  BASIC METABOLIC PANEL - Abnormal; Notable for the following components:      Result Value   Glucose, Bld 128 (*)    Creatinine, Ser 1.01 (*)    GFR calc non Af Amer 60 (*)  All other components within normal limits  CBC WITH DIFFERENTIAL/PLATELET - Abnormal; Notable for the following components:   Hemoglobin 11.3 (*)    HCT 35.3 (*)    All other components within normal limits  SEDIMENTATION RATE - Abnormal; Notable for the following components:   Sed Rate 52 (*)    All other components within normal limits  RESPIRATORY PANEL BY RT PCR (FLU A&B, COVID)  C-REACTIVE PROTEIN   ____________________________________________  EKG  None - EKG not ordered by ED physician ____________________________________________  RADIOLOGY Marylou Mccoy, personally viewed and evaluated these images (plain radiographs) as part of my medical decision making, as well as reviewing the written report by the radiologist.  ED MD interpretation: Findings concerning for T6-7 discitis/osteomyelitis versus degenerative changes.  The patient also has significant canal stenosis in the lumbar spine which could be the cause of her more acute pain tonight.  No emergent spinal impingement.  Official radiology report(s): MR THORACIC SPINE W WO CONTRAST  Result Date: 09/27/2019 CLINICAL DATA:  Initial evaluation for mid to lower back pain. History of discitis with ventral epidural phlegmon, status post antibiotic therapy. EXAM: MRI THORACIC AND LUMBAR SPINE WITHOUT AND WITH CONTRAST TECHNIQUE:  Multiplanar and multiecho pulse sequences of the thoracic and lumbar spine were obtained without and with intravenous contrast. CONTRAST:  60mL GADAVIST GADOBUTROL 1 MMOL/ML IV SOLN COMPARISON:  None available. FINDINGS: MRI THORACIC SPINE FINDINGS Alignment:  Examination moderately degraded by motion artifact. Vertebral bodies normally aligned with preservation of the normal thoracic kyphosis. No listhesis. Vertebrae: Vertebral body height maintained without evidence for acute or chronic fracture. There is mild loss of disc space height with endplate irregularity and marrow edema about the T6-7 interspace (series 21, image 8). Associated mild postcontrast enhancement seen following contrast administration. While this finding could conceivably be degenerative in nature, finding suspected to reflect sequelae of acute infection/discitis osteomyelitis given provided history. No significant or appreciable epidural extension, with no visible epidural phlegmon or abscess. No significant paraspinous inflammatory changes or discrete soft tissue abscess. No other evidence for acute infection within the thoracic spine. Underlying bone marrow signal intensity within normal limits. No discrete or worrisome osseous lesions. No other abnormal marrow edema or enhancement. Cord: Signal intensity within the thoracic spinal cord is grossly within normal limits on this motion degraded exam. No appreciable epidural abscess or other collection. No abnormal enhancement. Paraspinal and other soft tissues: Paraspinous soft tissues demonstrate no acute finding. No abnormal enhancement or soft tissue collection. Partially visualized lungs are grossly clear. Visualized visceral structures within normal limits. Disc levels: Multilevel degenerative disc bulging seen throughout the thoracic spine. Changes most pronounced at T6-7 where disc bulge mildly flattens and effaces the ventral thecal sac resultant mild spinal stenosis. No other  significant canal narrowing. Mild multilevel facet hypertrophy without significant foraminal stenosis. MRI LUMBAR SPINE FINDINGS Segmentation: Standard. Lowest well-formed disc space labeled the L5-S1 level. Alignment: 2 mm anterolisthesis of L3 on L4, with 3 mm anterolisthesis of L4 on L5. Findings are chronic and facet mediated. Alignment otherwise normal with preservation of the normal lumbar lordosis. Vertebrae: Vertebral body height maintained without evidence for acute or chronic fracture. Bone marrow signal intensity within normal limits. No discrete or worrisome osseous lesions. No abnormal marrow edema or enhancement to suggest osteomyelitis discitis or septic arthritis. Conus medullaris: Extends to the L1 level and appears normal. Mild clumping of the nerve roots of the cauda equina above the L3-4 level related to underlying stenosis. Paraspinal and other  soft tissues: Unremarkable. Disc levels: L1-2: Disc desiccation without significant disc bulge. Mild facet hypertrophy. No stenosis. L2-3: Chronic intervertebral disc space narrowing with diffuse disc bulge and disc desiccation. Reactive endplate changes with marginal endplate osteophytic spurring. Moderate facet and ligament flavum hypertrophy. Resultant mild to moderate spinal stenosis. Mild to moderate bilateral L2 foraminal narrowing. L3-4: Anterolisthesis. Diffuse disc bulge with disc desiccation. Advanced bilateral facet and ligament flavum hypertrophy. Associated small bilateral joint effusions. Resultant severe spinal stenosis. Moderate right with mild to moderate left L3 foraminal narrowing. L4-5: Anterolisthesis. Diffuse disc bulge with disc desiccation. Disc bulging eccentric to the left. Moderate to advanced bilateral facet hypertrophy. Associated small bilateral joint effusions. Moderate to severe canal with left worse than right lateral recess stenosis. Mild right with moderate left L4 foraminal narrowing. L5-S1: Diffuse disc bulge with disc  desiccation. Moderate facet hypertrophy. Mild epidural lipomatosis. No canal or lateral recess stenosis. Foramina remain patent. IMPRESSION: 1. Loss of disc space height, endplate irregularity, with mild marrow edema and enhancement about the T6-7 interspace, likely reflecting discitis/osteomyelitis given provided history. No associated epidural phlegmon or abscess. No paraspinous collections. 2. No other evidence for acute infection within the thoracic or lumbar spine. 3. Multifactorial degenerative changes at L3-4 with resultant severe spinal stenosis. 4. Disc bulging with facet hypertrophy at L4-5 with resultant moderate to severe canal with left worse than right lateral recess stenosis. Electronically Signed   By: Rise Mu M.D.   On: 09/27/2019 04:50   MR Lumbar Spine W Wo Contrast  Result Date: 09/27/2019 CLINICAL DATA:  Initial evaluation for mid to lower back pain. History of discitis with ventral epidural phlegmon, status post antibiotic therapy. EXAM: MRI THORACIC AND LUMBAR SPINE WITHOUT AND WITH CONTRAST TECHNIQUE: Multiplanar and multiecho pulse sequences of the thoracic and lumbar spine were obtained without and with intravenous contrast. CONTRAST:  53mL GADAVIST GADOBUTROL 1 MMOL/ML IV SOLN COMPARISON:  None available. FINDINGS: MRI THORACIC SPINE FINDINGS Alignment:  Examination moderately degraded by motion artifact. Vertebral bodies normally aligned with preservation of the normal thoracic kyphosis. No listhesis. Vertebrae: Vertebral body height maintained without evidence for acute or chronic fracture. There is mild loss of disc space height with endplate irregularity and marrow edema about the T6-7 interspace (series 21, image 8). Associated mild postcontrast enhancement seen following contrast administration. While this finding could conceivably be degenerative in nature, finding suspected to reflect sequelae of acute infection/discitis osteomyelitis given provided history. No  significant or appreciable epidural extension, with no visible epidural phlegmon or abscess. No significant paraspinous inflammatory changes or discrete soft tissue abscess. No other evidence for acute infection within the thoracic spine. Underlying bone marrow signal intensity within normal limits. No discrete or worrisome osseous lesions. No other abnormal marrow edema or enhancement. Cord: Signal intensity within the thoracic spinal cord is grossly within normal limits on this motion degraded exam. No appreciable epidural abscess or other collection. No abnormal enhancement. Paraspinal and other soft tissues: Paraspinous soft tissues demonstrate no acute finding. No abnormal enhancement or soft tissue collection. Partially visualized lungs are grossly clear. Visualized visceral structures within normal limits. Disc levels: Multilevel degenerative disc bulging seen throughout the thoracic spine. Changes most pronounced at T6-7 where disc bulge mildly flattens and effaces the ventral thecal sac resultant mild spinal stenosis. No other significant canal narrowing. Mild multilevel facet hypertrophy without significant foraminal stenosis. MRI LUMBAR SPINE FINDINGS Segmentation: Standard. Lowest well-formed disc space labeled the L5-S1 level. Alignment: 2 mm anterolisthesis of L3 on L4, with 3  mm anterolisthesis of L4 on L5. Findings are chronic and facet mediated. Alignment otherwise normal with preservation of the normal lumbar lordosis. Vertebrae: Vertebral body height maintained without evidence for acute or chronic fracture. Bone marrow signal intensity within normal limits. No discrete or worrisome osseous lesions. No abnormal marrow edema or enhancement to suggest osteomyelitis discitis or septic arthritis. Conus medullaris: Extends to the L1 level and appears normal. Mild clumping of the nerve roots of the cauda equina above the L3-4 level related to underlying stenosis. Paraspinal and other soft tissues:  Unremarkable. Disc levels: L1-2: Disc desiccation without significant disc bulge. Mild facet hypertrophy. No stenosis. L2-3: Chronic intervertebral disc space narrowing with diffuse disc bulge and disc desiccation. Reactive endplate changes with marginal endplate osteophytic spurring. Moderate facet and ligament flavum hypertrophy. Resultant mild to moderate spinal stenosis. Mild to moderate bilateral L2 foraminal narrowing. L3-4: Anterolisthesis. Diffuse disc bulge with disc desiccation. Advanced bilateral facet and ligament flavum hypertrophy. Associated small bilateral joint effusions. Resultant severe spinal stenosis. Moderate right with mild to moderate left L3 foraminal narrowing. L4-5: Anterolisthesis. Diffuse disc bulge with disc desiccation. Disc bulging eccentric to the left. Moderate to advanced bilateral facet hypertrophy. Associated small bilateral joint effusions. Moderate to severe canal with left worse than right lateral recess stenosis. Mild right with moderate left L4 foraminal narrowing. L5-S1: Diffuse disc bulge with disc desiccation. Moderate facet hypertrophy. Mild epidural lipomatosis. No canal or lateral recess stenosis. Foramina remain patent. IMPRESSION: 1. Loss of disc space height, endplate irregularity, with mild marrow edema and enhancement about the T6-7 interspace, likely reflecting discitis/osteomyelitis given provided history. No associated epidural phlegmon or abscess. No paraspinous collections. 2. No other evidence for acute infection within the thoracic or lumbar spine. 3. Multifactorial degenerative changes at L3-4 with resultant severe spinal stenosis. 4. Disc bulging with facet hypertrophy at L4-5 with resultant moderate to severe canal with left worse than right lateral recess stenosis. Electronically Signed   By: Rise MuBenjamin  McClintock M.D.   On: 09/27/2019 04:50    ____________________________________________   PROCEDURES   Procedure(s) performed (including Critical  Care):  Procedures   ____________________________________________   INITIAL IMPRESSION / MDM / ASSESSMENT AND PLAN / ED COURSE  As part of my medical decision making, I reviewed the following data within the electronic MEDICAL RECORD NUMBER History obtained from family, Labs reviewed , Old chart reviewed, Patient signed out to Dr. Fuller PlanFunke, D[facility] (Dr. Adriana Simasook) and reviewed Notes from prior ED visits   Differential diagnosis includes, but is not limited to, sciatica, musculoskeletal strain, recurrent or worsening discitis, recurrent or worsening epidural phlegmon/abscess, transverse myelitis.  The patient does not appear to be in severe distress at this time but is reporting severe pain.  She has no new neurological deficits that I can appreciate.  However her history is very complicated and she finished a outpatient course of antibiotics via PICC line within the last few months.  She told me she has also been having some pain in her right thigh for a couple of weeks and has an appointment [facility] in about 3 weeks to follow-up on the right leg pain.  Given her complicated history and new onset pain in her left lower back and to the left side of her buttocks in the setting of not only her history but also having some pain on the right leg for the last couple of weeks, I think it is incumbent upon me to investigate with MRI to  look for recurrent spinal infection given the morbidity and mortality of a missed diagnosis.  I discussed this with the patient and she agrees.  I am obtaining basic lab work including inflammatory markers and have ordered an MRI with and without contrast of both the thoracic and lumbar spines given that her pain is up higher on her back than it was previously and she does have some reproducible tenderness to palpation.       Clinical Course as of Sep 26 816  Mon Sep 27, 2019  0115 no leukocytosis  CBC with  Differential/Platelet(!) [CF]  0147 Sed Rate(!): 52 [CF]  0148 normal BMP  Basic metabolic panel(!) [CF]  0626 Concern for discitis versus degenerative changes in thoracic spine, new from prior.  I talked with the patient and her daughter at bedside.  We discussed calling [facility] for transfer for further assessment versus waiting in this emergency department until the neurosurgeon, Dr. Lacinda Axon, comes on and could come and assess the patient and review the imaging to determine if this is legitimate discitis and if she needs additional treatment.  We all agree at this point that it would be better to await neurosurgical assessment rather than go through possibly an unnecessary transfer to [facility].  Even if this is discitis, it would be better to hold off on antibiotics until recommended by the specialist.  Given the very low probability of surgery, I said that it was okay if she needs to eat and drink something.  She will let us know if she needs additional pain medicine.  I am putting in a neurosurgical consult and will plan to call neurosurgery after 7 AM.  [place] [CF]  9485 I spoke by phone with Dr. Lacinda Axon with neurosurgery.  He said that given the results of the MRI, he does not feel that this is an emergent condition and she certainly does not need emergency surgery.  He said that the 2 options are to biopsy the area to determine if it is in fact infection or to treat empirically with antibiotics as was done the last time.  He said that he felt that outpatient clinic follow-up would be appropriate given no neurological deficits and stable vitals with no sign of systemic infection.I discussed the results and the plan with the patient and her preference is that I call [facility] to see if they want her to be transferred to them, follow-up in clinic, etc.  I have placed a call to the [facility] logistics center.   [CF]  [phone] Transferring ED care to Dr. Jari Pigg to follow up with [facility] regarding need for transfer to  their facility vs close outpatient follow up with her [facility] orthopedic surgeon   [CF]    Clinical Course User Index [CF] Hinda Kehr, MD     ____________________________________________  FINAL CLINICAL IMPRESSION(S) / ED DIAGNOSES  Final diagnoses:  Thoracic discitis  Left sided sciatica     MEDICATIONS GIVEN DURING THIS VISIT:  Medications  oxyCODONE-acetaminophen (PERCOCET/ROXICET) 5-325 MG per tablet 2 tablet (2 tablets Oral Given 09/27/19 0101)  gadobutrol (GADAVIST) 1 MMOL/ML injection 10 mL (10 mLs Intravenous Contrast Given 09/27/19 0315)     ED Discharge Orders    None      *Please note:  ARBUTUS NELLIGAN was evaluated in Emergency Department on 09/27/2019 for the symptoms described in the history of present illness. She was evaluated in the context of the global COVID-19 pandemic, which necessitated consideration that the patient  might be at risk for infection with the SARS-CoV-2 virus that causes COVID-19. Institutional protocols and algorithms that pertain to the evaluation of patients at risk for COVID-19 are in a state of rapid change based on information released by regulatory bodies including the CDC and federal and state organizations. These policies and algorithms were followed during the patient's care in the ED.  Some ED evaluations and interventions may be delayed as a result of limited staffing during the pandemic.*  Note:  This document was prepared using Dragon voice recognition software and may include unintentional dictation errors.   Loleta Rose, MD 09/27/19 [phone]

## 2019-09-27 NOTE — ED Notes (Signed)
Pt transported to MRI 

## 2019-09-27 NOTE — ED Notes (Signed)
Patient's tv turned on and patient repositioned. Asking about breakfast. Dining room called again to ask for tray.

## 2019-09-28 NOTE — Consult Note (Signed)
Neurosurgery-New Consultation Evaluation 09/28/2019 Nancy Riley [id number]  Identifying Statement: Nancy Riley is a 64 y.o. female from [place] [address] with back and leg pain  Physician Requesting Consultation: [facility]  History of Present Illness: Nancy Riley is here for evaluation as she does have a recent history of discitis treated at another facility in the lumbar spine.  She states more recently she was moving something and noticed some pain that started in the back going down to the buttocks area.  She does not endorse any obvious pain going down the legs to the feet.  She does deal with severe lymphedema including wrapping of the legs which does cause some discomfort.  She does not endorse any new numbness.  The pain is limiting her currently.  She does not endorse any changes in her bowel bladder habits.  As part of the work-up, MRI of the thoracic and lumbar spine were obtained with concern for possible areas of discitis.  There is some stenosis noted in the lumbar spine and we are consulted for evaluation    Past Medical History:  Past Medical History:  Diagnosis Date  . Diabetes mellitus without complication (HCC)   . DJD (degenerative joint disease)   . GERD (gastroesophageal reflux disease)   . Hypercholesteremia   . Hypertension   . Morbid obesity with BMI of 50.0-59.9, adult (HCC)   . Neuropathy   . Phlegmon    ventral epidural phlegmon identified on lumbar spine MRI at [facility] in Nov 2020  . Plantar fasciitis   . Sciatica   . Spondylodiscitis    lumbar spine (Nov 2020 at [facility])    Social History: Social History   Socioeconomic History  . Marital status: Single    Spouse name: Not on file  . Number of children: Not on file  . Years of education: Not on file  . Highest education level: Not on file  Occupational History  . Not on file  Tobacco Use  . Smoking status: Never Smoker  . Smokeless tobacco: Never Used   Substance and Sexual Activity  . Alcohol use: Not Currently  . Drug use: Never  . Sexual activity: Not on file  Other Topics Concern  . Not on file  Social History Narrative  . Not on file   Social Determinants of Health   Financial Resource Strain:   . Difficulty of Paying Living Expenses:   Food Insecurity:   . Worried About Programme researcher, broadcasting/film/video in the Last Year:   . Barista in the Last Year:   Transportation Needs:   . Freight forwarder (Medical):   Marland Kitchen Lack of Transportation (Non-Medical):   Physical Activity:   . Days of Exercise per Week:   . Minutes of Exercise per Session:   Stress:   . Feeling of Stress :   Social Connections:   . Frequency of Communication with Friends and Family:   . Frequency of Social Gatherings with Friends and Family:   . Attends Religious Services:   . Active Member of Clubs or Organizations:   . Attends Banker Meetings:   Marland Kitchen Marital Status:   Intimate Partner Violence:   . Fear of Current or Ex-Partner:   . Emotionally Abused:   Marland Kitchen Physically Abused:   . Sexually Abused:     Family History: Family History  Problem Relation Age of Onset  . Breast cancer Neg Hx   . Colon cancer Neg Hx  Review of Systems:  Review of Systems - General ROS: Negative Psychological ROS: Negative Ophthalmic ROS: Negative ENT ROS: Negative Hematological and Lymphatic ROS: Negative  Endocrine ROS: Negative Respiratory ROS: Negative Cardiovascular ROS: Negative Gastrointestinal ROS: Negative Genito-Urinary ROS: Negative Musculoskeletal ROS: Positive for back pain Neurological ROS: Positive for buttocks pain Dermatological ROS: Negative  Physical Exam: BP (!) 148/56   Pulse 78   Temp 97.8 F (36.6 C) (Oral)   Resp 15   Ht 5\' 3"  (1.6 m)   Wt 136.1 kg   SpO2 99%   BMI 53.14 kg/m  Body mass index is 53.14 kg/m. Body surface area is 2.46 meters squared. General appearance: Alert, cooperative, in no acute  distress Head: Normocephalic, atraumatic Eyes: Normal, EOM intact Oropharynx: Wearing facemask Ext: Severe edema in bilateral lower extremities, wraps in place  Neurologic exam:  Mental status: alertness: alert,  affect: normal Speech: fluent and clear Motor:strength symmetric 4+ out of 5 in hip flexion, 5 out of 5 in knee extension, dorsiflexion, plantarflexion although range of motion is limited by edema Sensory: Sensation difficult to test but intact proximally and distally, unable to test mid leg given wraps Gait: Not tested  Laboratory: Results for orders placed or performed during the hospital encounter of 09/27/19  Respiratory Panel by RT PCR (Flu A&B, Covid) - Nasopharyngeal Swab   Specimen: Nasopharyngeal Swab  Result Value Ref Range   SARS Coronavirus 2 by RT PCR NEGATIVE NEGATIVE   Influenza A by PCR NEGATIVE NEGATIVE   Influenza B by PCR NEGATIVE NEGATIVE  Basic metabolic panel  Result Value Ref Range   Sodium 135 135 - 145 mmol/L   Potassium 3.9 3.5 - 5.1 mmol/L   Chloride 100 98 - 111 mmol/L   CO2 26 22 - 32 mmol/L   Glucose, Bld 128 (H) 70 - 99 mg/dL   BUN 18 8 - 23 mg/dL   Creatinine, Ser 1.01 (H) 0.44 - 1.00 mg/dL   Calcium 9.0 8.9 - 10.3 mg/dL   GFR calc non Af Amer 60 (L) >60 mL/min   GFR calc Af Amer >60 >60 mL/min   Anion gap 9 5 - 15  CBC with Differential/Platelet  Result Value Ref Range   WBC 5.5 4.0 - 10.5 K/uL   RBC 4.15 3.87 - 5.11 MIL/uL   Hemoglobin 11.3 (L) 12.0 - 15.0 g/dL   HCT 35.3 (L) 36.0 - 46.0 %   MCV 85.1 80.0 - 100.0 fL   MCH 27.2 26.0 - 34.0 pg   MCHC 32.0 30.0 - 36.0 g/dL   RDW 13.8 11.5 - 15.5 %   Platelets 350 150 - 400 K/uL   nRBC 0.0 0.0 - 0.2 %   Neutrophils Relative % 52 %   Neutro Abs 2.9 1.7 - 7.7 K/uL   Lymphocytes Relative 34 %   Lymphs Abs 1.9 0.7 - 4.0 K/uL   Monocytes Relative 8 %   Monocytes Absolute 0.5 0.1 - 1.0 K/uL   Eosinophils Relative 4 %   Eosinophils Absolute 0.2 0.0 - 0.5 K/uL   Basophils Relative 1  %   Basophils Absolute 0.1 0.0 - 0.1 K/uL   Immature Granulocytes 1 %   Abs Immature Granulocytes 0.04 0.00 - 0.07 K/uL  Sedimentation rate  Result Value Ref Range   Sed Rate 52 (H) 0 - 30 mm/hr  C-reactive protein  Result Value Ref Range   CRP 1.0 (H) <1.0 mg/dL   I personally reviewed labs  Imaging: MRI thoracic/lumbar spine: 1. Loss  of disc space height, endplate irregularity, with mild marrow edema and enhancement about the T6-7 interspace, likely reflecting discitis/osteomyelitis given provided history. No associated epidural phlegmon or abscess. No paraspinous collections. 2. No other evidence for acute infection within the thoracic or lumbar spine. 3. Multifactorial degenerative changes at L3-4 with resultant severe spinal stenosis. 4. Disc bulging with facet hypertrophy at L4-5 with resultant moderate to severe canal with left worse than right lateral recess stenosis.  Impression/Plan:  Ms. Beecher is here for evaluation of back and buttocks pain.  I discussed with her that there is some lumbar stenosis that she likely does have some exacerbation of that given her pain.  However, there is also concern for possible edema enhancement around the T6-7 level which concerning for infection given she has had a previous infection in the lumbar spine treated in the past few months.  I recommend follow-up with her infectious disease doctor to discuss further treatment including possible biopsy of the disc base and antibiotics.  There is no surgery recommended for this and no elective surgery recommended at this time for her lumbar spine given she has not had any conservative management and the history of recent infection.  I would recommend her follow-up with her primary care provider to discuss initial treatment for back pain.  I would avoid steroids at this time given the concern for infection.   1.  Diagnosis: Back pain, possible discitis  2.  Plan -Recommend follow-up with her infectious  disease doctor -No surgery recommended

## 2019-09-29 ENCOUNTER — Encounter: Payer: Self-pay | Admitting: Occupational Therapy

## 2019-09-29 ENCOUNTER — Ambulatory Visit: Payer: Medicare PPO | Admitting: Occupational Therapy

## 2019-09-29 ENCOUNTER — Other Ambulatory Visit: Payer: Self-pay

## 2019-09-29 DIAGNOSIS — I890 Lymphedema, not elsewhere classified: Secondary | ICD-10-CM

## 2019-09-29 NOTE — Therapy (Addendum)
[place] MAIN [facility] SERVICES [address] [place], [place], [address] Phone: [phone]   Fax:  [phone]  Occupational Therapy Treatment Note and Progress Report: Lymphedema Care  Patient Details  Name: Nancy Riley MRN: [id number] Date of Birth: [birth year] Referring Provider (OT): Delight Stare, MD   Encounter Date: 09/29/2019  OT End of Session - 09/29/19 1215    Visit Number  10    Number of Visits  36    Date for OT Re-Evaluation  11/21/19    OT Start Time  0800    OT Stop Time  0900    OT Time Calculation (min)  60 min    Activity Tolerance  Patient tolerated treatment well;No increased pain;Patient limited by pain    Behavior During Therapy  [facility] for tasks assessed/performed       Past Medical History:  Diagnosis Date  . Diabetes mellitus without complication ([place])   . DJD (degenerative joint disease)   . GERD (gastroesophageal reflux disease)   . Hypercholesteremia   . Hypertension   . Morbid obesity with BMI of 50.0-59.9, adult ([place])   . Neuropathy   . Phlegmon    ventral epidural phlegmon identified on lumbar spine MRI at [facility] in Nov 2020  . Plantar fasciitis   . Sciatica   . Spondylodiscitis    lumbar spine (Nov 2020 at [facility])    Past Surgical History:  Procedure Laterality Date  . BREAST EXCISIONAL BIOPSY Left 1970   negative  . COLONOSCOPY      There were no vitals filed for this visit.  Subjective Assessment - 09/29/19 1113    Subjective   Nancy Riley presents for OT visit 10/36 to treat BLE lymphedema. Pt presents with knee length compression wraps in place bilaterally. Pt reports back pain at level 5/10 and knee pain at 15/10. Pt reports she has not yet been contacted re scheduling follow up visit with [facility] related to recent ER visit for severe back pain.    Pertinent History  Spondylodiscitis, BLE lymphedema, HTN, DM, neuropathy, DJD, arthritis, sciatica,  chronic back pain, morbid obesity, (BMI 50-59.9)     Limitations  Difficulty walking, impaired transfers/functional mobility, chronic back DJD and knee  OA pain , chronic leg swelling decreased strength, impaired balance, impaired sensation (neuropathy in feet), increased infection risk, increased fall risk,    Repetition  Increases Symptoms    Special Tests  + Stemmer bilaterally    Patient Stated Goals  get swelling back down and replace worn compression stockings to limit LE progression    Pain Onset  Other (comment)   chronic                  OT Treatments/Exercises (OP) - 09/29/19 0001      ADLs   ADL Education Given  Yes      Manual Therapy   Manual Therapy  Edema management    Edema Management  skin care with low ph castor oil throughout LLE MLD to improve hydration and flexibility    Manual Lymphatic Drainage (MLD)  RLE MLD as established , including short neck sequence, deep diaphragmatic breathing, functional L inguinal LN and lLE segments from proximal to distal. Good tolerance.    Compression Bandaging  Compression wraps to BLE as established.             OT Education - 09/29/19 1218    Education Details  Continued skilled Pt/caregiver education  And LE ADL training  throughout visit for lymphedema self care/ home program, including compression wrapping, compression garment and device wear/care, lymphatic pumping ther ex, simple self-MLD, and skin care. Discussed progress towards goals.    Person(s) Educated  Patient    Methods  Explanation;Demonstration    Comprehension  Verbalized understanding;Need further instruction;Returned demonstration          OT Long Term Goals - 09/29/19 1218      OT LONG TERM GOAL #1   Title  Pt will demonstrate understanding of lymphedema precautions and signs/ symptoms of cellulitisby identifying 6 items for each using printed Lymphedema Workbook for reference PRN (modified independence) to limit LE progression and infection risk.    Baseline  minA    Time  4     Period  Days    Status  Achieved      OT LONG TERM GOAL #2   Title  Pt will require Max assist from caregiver who will be able to apply multilayer, gradient compression wraps using correct techniques after skilled training to reduce limb volume and limity LE progression.    Baseline  dependent    Time  4    Period  Days    Status  Achieved      OT LONG TERM GOAL #3   Title  Pt will achieve 10% limb volume reductions  bilaterally from ankle to tibial tuberosity during Intensive Phase CDT to return limbs to typical size and shape ,. reduce infection risk and improve basic andf instrumental ALDs performance.    Baseline  Achieved for RLE w/ 13.86 volume reduction below the knee (ankle-tibial tuberosity A-D). LLE has not been measured yet but reduction is visible. I would estimate > 5% but < 10% thus far.    Time  12    Period  Days    Status  Partially Met      OT LONG TERM GOAL #4   Title  Pt will consistently perform all LE self -care home protocols ( simple self MLD, skin care, therapeutic exercise, compression therapies) daily  with max CG assistance to to achieve max edema reduction, to enanble functional improvements,  including fitting preferred street shoes and standard sized clothing, and to achieve increased AROM and reduced falls risk.    Baseline  Max A    Time  12    Period  Weeks    Status  Partially Met   Pt diligent with le care, but does not always always have assistance to remove and re-wrap compression wraps daily as perscribed. Skin is wel cared for and infection risk appears low for current condition.     OT LONG TERM GOAL #5   Title  Patient will schedule a consult this bariatric medicine specialist Enrigue Catena, MD- [facility]) to demonstrate understanding of  the importance of weight management,to achieve and sustain optimal control of limb swelling over time.    Baseline  Max A    Time  12    Period  Weeks    Status  On-going            Plan - 09/29/19 1242     Clinical Impression Statement  Pt tolerated MLDw fibrosis techniques to LLE today without increased pain. Rewrapped both legs below the knee. Pt demonstrates steady progress towards all OT goals for LE care. Volume reduction goal met for RLE and is ongoing during CDT to LLE. RLE  garments have been ordered. Pt is diligent with all LE self care protocols during visit  intervals, except sometimes does not have assistance with re-applying wraps q 24 hrs. Overall Pt is doing well and prognosis remains good . Pt is awaiting return visit to ID physician  she saw at [facility] during recent episode of spinal osteomyelitis s/p ED visit for bac pain over the weekend. If infection is suspected we will suspend OT based on need for home health nursing for IV antibiotics, and resume when she is released  for [facility]. Cont 2 x q week as per OC.    Occupational performance deficits (Please refer to evaluation for details):  ADL's;Work;IADL's;Rest and Sleep;Leisure;Social Participation;Other   role performance      Patient will benefit from skilled therapeutic intervention in order to improve the following deficits and impairments:           Visit Diagnosis: Lymphedema, not elsewhere classified    Problem List There are no problems to display for this patient.   Andrey Spearman, MS, OTR/L, [facility] 09/29/19 12:47 PM  [place] MAIN [facility] SERVICES [address] [place], [place], [address] Phone: [phone]   Fax:  [phone]  Name: EWELINA NAVES MRN: [id number] Date of Birth: 10-29-1955

## 2019-10-04 ENCOUNTER — Ambulatory Visit: Payer: Medicare PPO | Attending: Family Medicine | Admitting: Occupational Therapy

## 2019-10-04 ENCOUNTER — Encounter: Payer: Self-pay | Admitting: Occupational Therapy

## 2019-10-04 ENCOUNTER — Other Ambulatory Visit: Payer: Self-pay

## 2019-10-04 DIAGNOSIS — M4624 Osteomyelitis of vertebra, thoracic region: Secondary | ICD-10-CM | POA: Insufficient documentation

## 2019-10-04 DIAGNOSIS — M1990 Unspecified osteoarthritis, unspecified site: Secondary | ICD-10-CM | POA: Insufficient documentation

## 2019-10-04 DIAGNOSIS — I890 Lymphedema, not elsewhere classified: Secondary | ICD-10-CM | POA: Diagnosis not present

## 2019-10-04 NOTE — Therapy (Signed)
[place] MAIN [facility] SERVICES [address] [place], [place], [address] Phone: [phone]   Fax:  [phone]  Occupational Therapy Treatment  Patient Details  Name: Nancy Riley MRN: [id number] Date of Birth: 1955-10-19 Referring Provider (OT): Delight Stare, MD   Encounter Date: 10/04/2019  OT End of Session - 10/04/19 1257    Visit Number  11    Number of Visits  36    Date for OT Re-Evaluation  11/21/19    OT Start Time  0907    OT Stop Time  1007    OT Time Calculation (min)  60 min    Activity Tolerance  Patient tolerated treatment well;No increased pain;Patient limited by pain    Behavior During Therapy  [facility] for tasks assessed/performed       Past Medical History:  Diagnosis Date  . Diabetes mellitus without complication ([place])   . DJD (degenerative joint disease)   . GERD (gastroesophageal reflux disease)   . Hypercholesteremia   . Hypertension   . Morbid obesity with BMI of 50.0-59.9, adult ([place])   . Neuropathy   . Phlegmon    ventral epidural phlegmon identified on lumbar spine MRI at [facility] in Nov 2020  . Plantar fasciitis   . Sciatica   . Spondylodiscitis    lumbar spine (Nov 2020 at [facility]    Past Surgical History:  Procedure Laterality Date  . BREAST EXCISIONAL BIOPSY Left 1970   negative  . COLONOSCOPY      There were no vitals filed for this visit.  Subjective Assessment - 10/04/19 1254    Subjective   Nancy Riley presents for OT visit 11/36 to treat BLE lymphedema. Pt presents with knee length compression wraps in place bilaterally. Pt reports back pain at level 5/10 and knee pain at 15/10. Pt reports her [place] shows she was to be admitted today at 7 am, but no one has contacted her re this plan . Pt is anxious and unsure  about this POC as she has to arange for caregiver for her mother and for transportation.    Pertinent History  Spondylodiscitis, BLE lymphedema, HTN, DM, neuropathy, DJD, arthritis,  sciatica,  chronic back pain, morbid obesity, (BMI 50-59.9)    Limitations  Difficulty walking, impaired transfers/functional mobility, chronic back DJD and knee  OA pain , chronic leg swelling decreased strength, impaired balance, impaired sensation (neuropathy in feet), increased infection risk, increased fall risk,    Repetition  Increases Symptoms    Special Tests  + Stemmer bilaterally    Patient Stated Goals  get swelling back down and replace worn compression stockings to limit LE progression    Pain Onset  Other (comment)   chronic                  OT Treatments/Exercises (OP) - 10/04/19 0001      ADLs   ADL Education Given  Yes      Manual Therapy   Manual Therapy  Edema management    Edema Management  skin care with low ph castor oil throughout LLE MLD to improve hydration and flexibility    Manual Lymphatic Drainage (MLD)  RLE MLD as established , including short neck sequence, deep diaphragmatic breathing, functional L inguinal LN and lLE segments from proximal to distal. Good tolerance.    Compression Bandaging  Compression wraps to BLE as established.             OT Education -  10/04/19 1257    Education Details  Continued skilled Pt/caregiver education  And LE ADL training throughout visit for lymphedema self care/ home program, including compression wrapping, compression garment and device wear/care, lymphatic pumping ther ex, simple self-MLD, and skin care. Discussed progress towards goals.    Person(s) Educated  Patient    Methods  Explanation;Demonstration    Comprehension  Verbalized understanding;Need further instruction;Returned demonstration          OT Long Term Goals - 09/29/19 1218      OT LONG TERM GOAL #1   Title  Pt will demonstrate understanding of lymphedema precautions and signs/ symptoms of cellulitisby identifying 6 items for each using printed Lymphedema Workbook for reference PRN (modified independence) to limit LE progression  and infection risk.    Baseline  minA    Time  4    Period  Days    Status  Achieved      OT LONG TERM GOAL #2   Title  Pt will require Max assist from caregiver who will be able to apply multilayer, gradient compression wraps using correct techniques after skilled training to reduce limb volume and limity LE progression.    Baseline  dependent    Time  4    Period  Days    Status  Achieved      OT LONG TERM GOAL #3   Title  Pt will achieve 10% limb volume reductions  bilaterally from ankle to tibial tuberosity during Intensive Phase CDT to return limbs to typical size and shape ,. reduce infection risk and improve basic andf instrumental ALDs performance.    Baseline  Achieved for RLE w/ 13.86 volume reduction below the knee (ankle-tibial tuberosity A-D). LLE has not been measured yet but reduction is visible. I would estimate > 5% but < 10% thus far.    Time  12    Period  Days    Status  Partially Met      OT LONG TERM GOAL #4   Title  Pt will consistently perform all LE self -care home protocols ( simple self MLD, skin care, therapeutic exercise, compression therapies) daily  with max CG assistance to to achieve max edema reduction, to enanble functional improvements,  including fitting preferred street shoes and standard sized clothing, and to achieve increased AROM and reduced falls risk.    Baseline  Max A    Time  12    Period  Weeks    Status  Partially Met   Pt diligent with le care, but does not always always have assistance to remove and re-wrap compression wraps daily as perscribed. Skin is wel cared for and infection risk appears low for current condition.     OT LONG TERM GOAL #5   Title  Patient will schedule a consult this bariatric medicine specialist Enrigue Catena, MD- [facility]) to demonstrate understanding of  the importance of weight management,to achieve and sustain optimal control of limb swelling over time.    Baseline  Max A    Time  12    Period  Weeks    Status   On-going            Plan - 10/04/19 1259    Clinical Impression Statement  BLE compression wraps sliding down and appear not to have beeen changed in a couple of days. Reviewed importance of removing wraps daily to inspect skin and wash and hydrate skin Leg swelling appears well managed this morning. After RLE MLD  re-applied BLE knee length compression wraps. OT for CDT may be interrupted after hospital admission in the next day or so to explore possible recurrent osteomyelitis. If Pt receives home health for assistance with IV antibiotics, she will not be able to attend outpatient OT rehab services until [facility]. Pt's gfamily is well versed in assisting her with LE compression and custom stockings have been ordered. Pt will require new referral releasing her to resume OT for LE care before resuming CDT.    Occupational performance deficits (Please refer to evaluation for details):  ADL's;Work;IADL's;Rest and Sleep;Leisure;Social Participation;Other   role performance   Rehab Potential  Good       Patient will benefit from skilled therapeutic intervention in order to improve the following deficits and impairments:           Visit Diagnosis: Lymphedema, not elsewhere classified    Problem List There are no problems to display for this patient.   Andrey Spearman, MS, OTR/L, [facility] 10/04/19 1:06 PM  [place] MAIN [facility] SERVICES [address] [place], [place], [address] Phone: [phone]   Fax:  [phone]  Name: Nancy Riley MRN: [id number] Date of Birth: Nov 09, 1955

## 2019-10-05 MED ORDER — MELATONIN 3 MG PO TABS
3.00 | ORAL_TABLET | ORAL | Status: DC
Start: ? — End: 2019-10-05

## 2019-10-05 MED ORDER — ACETAMINOPHEN 325 MG PO TABS
650.00 | ORAL_TABLET | ORAL | Status: DC
Start: ? — End: 2019-10-05

## 2019-10-05 MED ORDER — PANTOPRAZOLE SODIUM 20 MG PO TBEC
20.00 | DELAYED_RELEASE_TABLET | ORAL | Status: DC
Start: 2019-10-07 — End: 2019-10-05

## 2019-10-05 MED ORDER — TRAMADOL HCL 50 MG PO TABS
50.00 | ORAL_TABLET | ORAL | Status: DC
Start: ? — End: 2019-10-05

## 2019-10-05 MED ORDER — LISINOPRIL 5 MG PO TABS
5.00 | ORAL_TABLET | ORAL | Status: DC
Start: 2019-10-07 — End: 2019-10-05

## 2019-10-05 MED ORDER — HYDROCHLOROTHIAZIDE 25 MG PO TABS
25.00 | ORAL_TABLET | ORAL | Status: DC
Start: 2019-10-08 — End: 2019-10-05

## 2019-10-05 MED ORDER — PRAVASTATIN SODIUM 20 MG PO TABS
20.00 | ORAL_TABLET | ORAL | Status: DC
Start: 2019-10-07 — End: 2019-10-05

## 2019-10-05 MED ORDER — LORATADINE 10 MG PO TABS
10.00 | ORAL_TABLET | ORAL | Status: DC
Start: 2019-10-08 — End: 2019-10-05

## 2019-10-05 MED ORDER — MAGNESIUM OXIDE 400 MG PO TABS
400.00 | ORAL_TABLET | ORAL | Status: DC
Start: 2019-10-08 — End: 2019-10-05

## 2019-10-05 MED ORDER — GABAPENTIN 300 MG PO CAPS
300.00 | ORAL_CAPSULE | ORAL | Status: DC
Start: 2019-10-07 — End: 2019-10-05

## 2019-10-05 MED ORDER — FERROUS SULFATE 325 (65 FE) MG PO TABS
325.00 | ORAL_TABLET | ORAL | Status: DC
Start: 2019-10-08 — End: 2019-10-05

## 2019-10-05 MED ORDER — DICLOFENAC SODIUM 1 % EX GEL
2.00 | CUTANEOUS | Status: DC
Start: 2019-10-07 — End: 2019-10-05

## 2019-10-05 MED ORDER — CYCLOBENZAPRINE HCL 10 MG PO TABS
10.00 | ORAL_TABLET | ORAL | Status: DC
Start: 2019-10-07 — End: 2019-10-05

## 2019-10-05 MED ORDER — POTASSIUM CHLORIDE CRYS ER 10 MEQ PO TBCR
10.00 | EXTENDED_RELEASE_TABLET | ORAL | Status: DC
Start: 2019-10-08 — End: 2019-10-05

## 2019-10-06 ENCOUNTER — Ambulatory Visit: Payer: Medicare PPO | Admitting: Occupational Therapy

## 2019-10-06 MED ORDER — ENOXAPARIN SODIUM 40 MG/0.4ML ~~LOC~~ SOLN
40.00 | SUBCUTANEOUS | Status: DC
Start: 2019-10-08 — End: 2019-10-06

## 2019-10-07 MED ORDER — LISINOPRIL 10 MG PO TABS
10.00 | ORAL_TABLET | ORAL | Status: DC
Start: 2019-10-08 — End: 2019-10-07

## 2019-10-11 ENCOUNTER — Ambulatory Visit: Payer: Medicare PPO | Admitting: Occupational Therapy

## 2019-10-11 ENCOUNTER — Other Ambulatory Visit: Payer: Self-pay

## 2019-10-11 DIAGNOSIS — I890 Lymphedema, not elsewhere classified: Secondary | ICD-10-CM

## 2019-10-11 NOTE — Therapy (Signed)
[place] MAIN [facility] SERVICES [address] [place], [place], [address] Phone: [phone]   Fax:  [phone]  Occupational Therapy Treatment  Patient Details  Name: Nancy Riley MRN: [id number] Date of Birth: [birth year] Referring Provider (OT): Delight Stare, MD   Encounter Date: 10/11/2019  OT End of Session - 10/11/19 1055    Visit Number  11    Number of Visits  36    Date for OT Re-Evaluation  11/21/19    OT Start Time  1010    OT Stop Time  1040    OT Time Calculation (min)  30 min    Activity Tolerance  Patient tolerated treatment well;No increased pain;Patient limited by pain    Behavior During Therapy  [facility] for tasks assessed/performed       Past Medical History:  Diagnosis Date  . Diabetes mellitus without complication ([place])   . DJD (degenerative joint disease)   . GERD (gastroesophageal reflux disease)   . Hypercholesteremia   . Hypertension   . Morbid obesity with BMI of 50.0-59.9, adult ([place])   . Neuropathy   . Phlegmon    ventral epidural phlegmon identified on lumbar spine MRI at [facility] in Nov 2020  . Plantar fasciitis   . Sciatica   . Spondylodiscitis    lumbar spine (Nov 2020 at [facility])    Past Surgical History:  Procedure Laterality Date  . BREAST EXCISIONAL BIOPSY Left 1970   negative  . COLONOSCOPY      There were no vitals filed for this visit.  Subjective Assessment - 10/11/19 1050    Subjective   Nancy Riley presents for OT visit 12/36 to treat BLE lymphedema. Pt presents with knee length compression wraps in place bilaterally. Pt denies leg pain this morning. She is released from [facility] to resume LE care after brief admission for exacerbation of back pain. Documentation in hospital noyes rulled out recurrent osteomyelitis. Pt reports she completed a Home Health PT evaluation late last week.    Pertinent History  Spondylodiscitis, BLE lymphedema, HTN, DM, neuropathy, DJD, arthritis, sciatica,   chronic back pain, morbid obesity, (BMI 50-59.9)    Limitations  Difficulty walking, impaired transfers/functional mobility, chronic back DJD and knee  OA pain , chronic leg swelling decreased strength, impaired balance, impaired sensation (neuropathy in feet), increased infection risk, increased fall risk,    Repetition  Increases Symptoms    Special Tests  + Stemmer bilaterally    Patient Stated Goals  get swelling back down and replace worn compression stockings to limit LE progression    Pain Score  0-No pain    Pain Onset  Other (comment)   chronic                          OT Education - 10/11/19 1054    Education Details  Pt edu re [place] and Outpatient rehab services may not be concurrent as per Medicare "double dipping" rule.    Person(s) Educated  Patient    Methods  Explanation;Demonstration    Comprehension  Verbalized understanding;Need further instruction;Returned demonstration          OT Long Term Goals - 09/29/19 1218      OT LONG TERM GOAL #1   Title  Pt will demonstrate understanding of lymphedema precautions and signs/ symptoms of cellulitisby identifying 6 items for each using printed Lymphedema Workbook for reference PRN (modified independence) to limit LE progression  and infection risk.    Baseline  minA    Time  4    Period  Days    Status  Achieved      OT LONG TERM GOAL #2   Title  Pt will require Max assist from caregiver who will be able to apply multilayer, gradient compression wraps using correct techniques after skilled training to reduce limb volume and limity LE progression.    Baseline  dependent    Time  4    Period  Days    Status  Achieved      OT LONG TERM GOAL #3   Title  Pt will achieve 10% limb volume reductions  bilaterally from ankle to tibial tuberosity during Intensive Phase CDT to return limbs to typical size and shape ,. reduce infection risk and improve basic andf instrumental ALDs performance.    Baseline   Achieved for RLE w/ 13.86 volume reduction below the knee (ankle-tibial tuberosity A-D). LLE has not been measured yet but reduction is visible. I would estimate > 5% but < 10% thus far.    Time  12    Period  Days    Status  Partially Met      OT LONG TERM GOAL #4   Title  Pt will consistently perform all LE self -care home protocols ( simple self MLD, skin care, therapeutic exercise, compression therapies) daily  with max CG assistance to to achieve max edema reduction, to enanble functional improvements,  including fitting preferred street shoes and standard sized clothing, and to achieve increased AROM and reduced falls risk.    Baseline  Max A    Time  12    Period  Weeks    Status  Partially Met   Pt diligent with le care, but does not always always have assistance to remove and re-wrap compression wraps daily as perscribed. Skin is wel cared for and infection risk appears low for current condition.     OT LONG TERM GOAL #5   Title  Patient will schedule a consult this bariatric medicine specialist Enrigue Catena, MD- [facility]) to demonstrate understanding of  the importance of weight management,to achieve and sustain optimal control of limb swelling over time.    Baseline  Max A    Time  12    Period  Weeks    Status  On-going            Plan - 10/11/19 1057    Clinical Impression Statement  Pt released to resume OT for LE care, but we're unable to resume at present due to Medicare rules not allowing reimbursement for both Home Health (Part A) and Outpatiebt Rehab (Part B) simultaneously. Pt encouraged to take advantage of Home Health to improve strength, functional ambulation, and safe home access ( NEEDS RAMP at home!). Pt will call and schedule return to outpatione OT for Lymphedema Care 2-3 weeks before completing [place]. No visit charge for today's visit.    Occupational performance deficits (Please refer to evaluation for details):  ADL's;Work;IADL's;Rest and Sleep;Leisure;Social  Participation;Other   role performance      Patient will benefit from skilled therapeutic intervention in order to improve the following deficits and impairments:           Visit Diagnosis: Lymphedema, not elsewhere classified    Problem List There are no problems to display for this patient.   Andrey Spearman, MS, OTR/L, CLT-LANA 10/11/19 11:02 AM   [place] MAIN  [place], [place], [address] Phone: [phone]   Fax:  [phone]  Name: Nancy Riley MRN: [id number] Date of Birth: 1956/05/10

## 2019-10-13 ENCOUNTER — Ambulatory Visit: Payer: Medicare PPO | Admitting: Occupational Therapy

## 2019-10-18 ENCOUNTER — Ambulatory Visit: Payer: Medicare PPO | Admitting: Occupational Therapy

## 2019-10-20 ENCOUNTER — Ambulatory Visit: Payer: Medicare PPO | Admitting: Occupational Therapy

## 2019-10-22 ENCOUNTER — Ambulatory Visit
Admission: RE | Admit: 2019-10-22 | Discharge: 2019-10-22 | Disposition: A | Discharge status: Home / Self Care | Payer: Medicare PPO | Source: Ambulatory Visit | Attending: Emergency Medicine | Admitting: Emergency Medicine

## 2019-10-22 DIAGNOSIS — Z1231 Encounter for screening mammogram for malignant neoplasm of breast: Secondary | ICD-10-CM | POA: Diagnosis not present

## 2019-10-25 ENCOUNTER — Ambulatory Visit: Payer: Medicare PPO | Admitting: Occupational Therapy

## 2019-10-27 ENCOUNTER — Ambulatory Visit: Payer: Medicare PPO | Admitting: Occupational Therapy

## 2019-11-03 ENCOUNTER — Ambulatory Visit: Payer: Medicare PPO | Admitting: Occupational Therapy

## 2020-11-22 IMAGING — MG DIGITAL SCREENING BILAT W/ TOMO W/ CAD
8 series · 8 of 24 positions shown · non-contrast
Comparison: Previous exam(s).

CLINICAL DATA: Screening.

EXAM:
DIGITAL SCREENING BILATERAL MAMMOGRAM WITH TOMO AND CAD

[R MLO synth-2D]
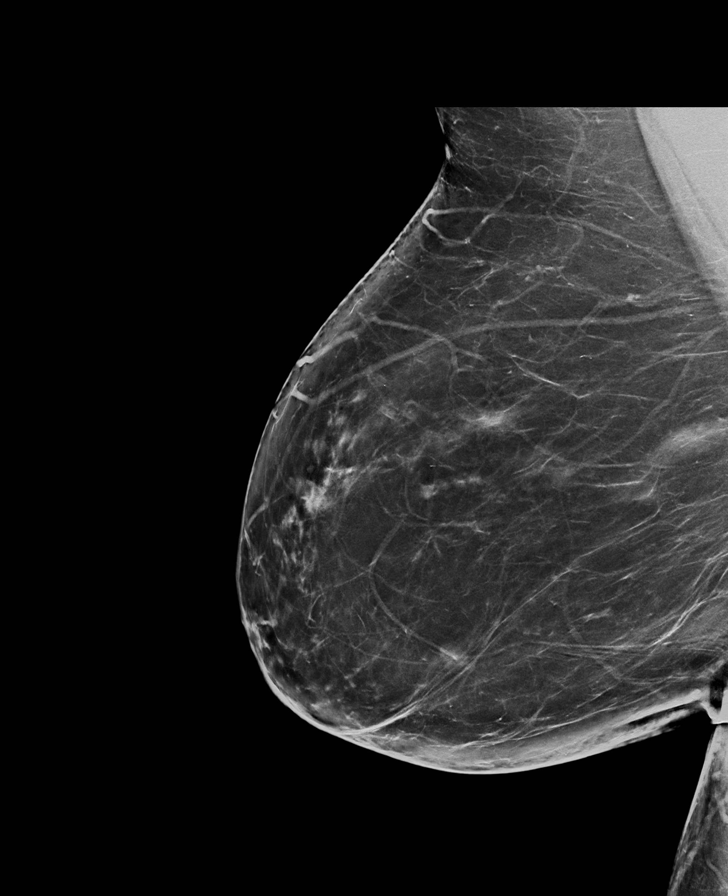

[L MLO synth-2D]
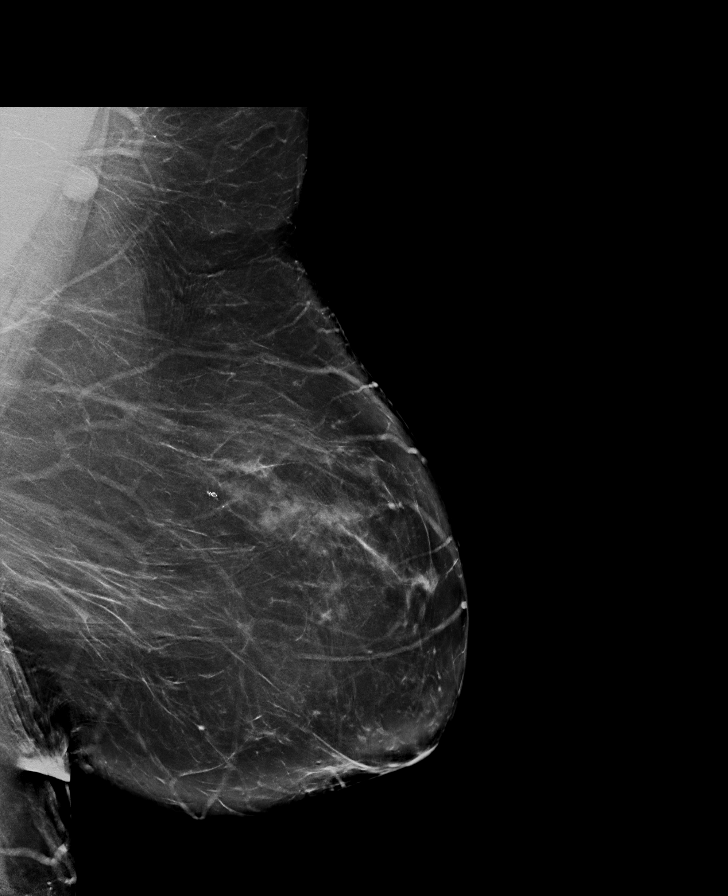

[L CC synth-2D]
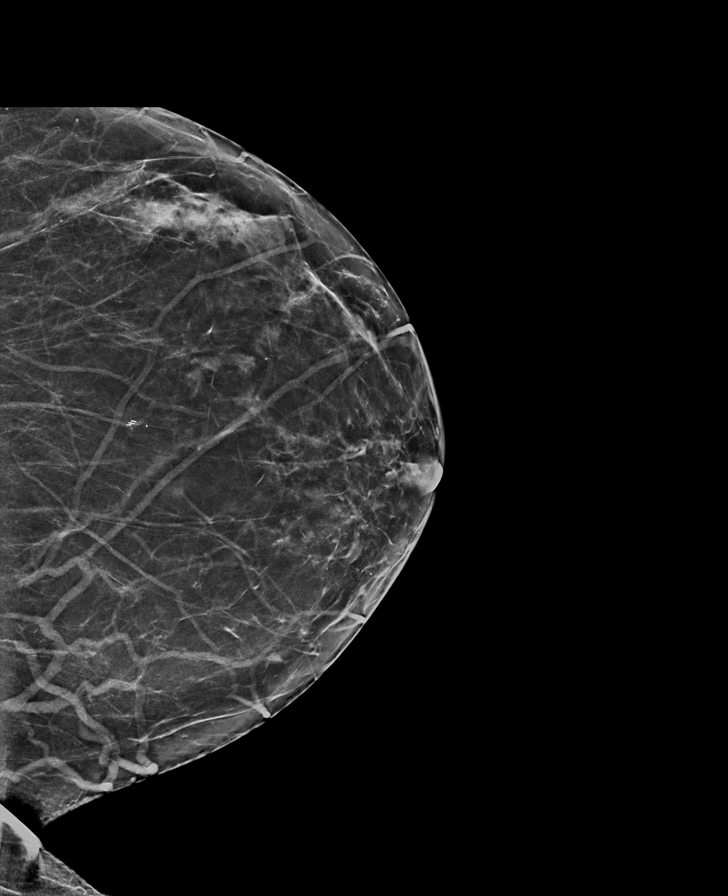

[R CC synth-2D]
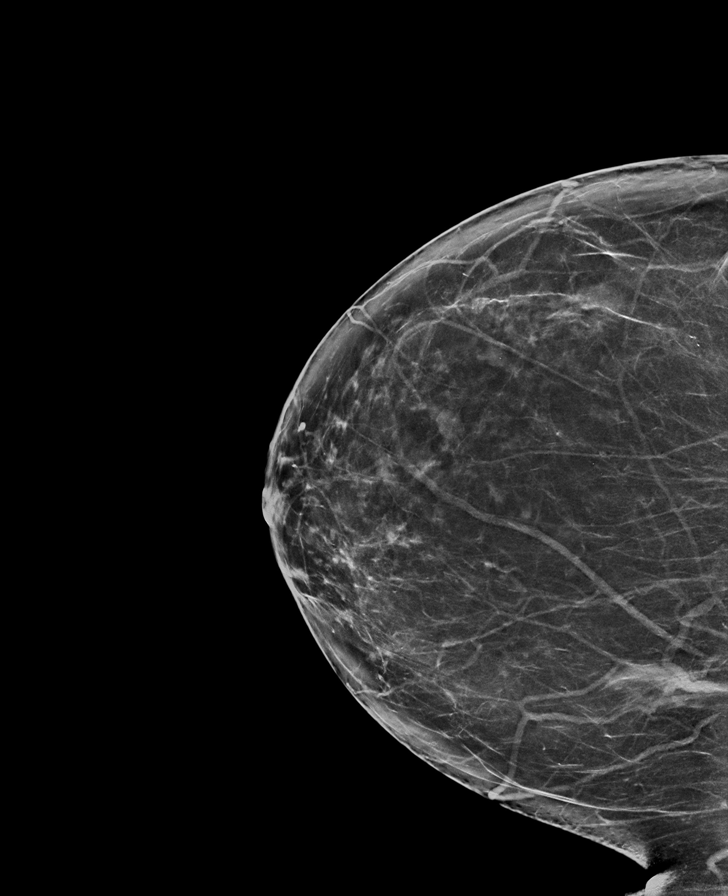

[L CC tomo · tomo slice 33/64.0]
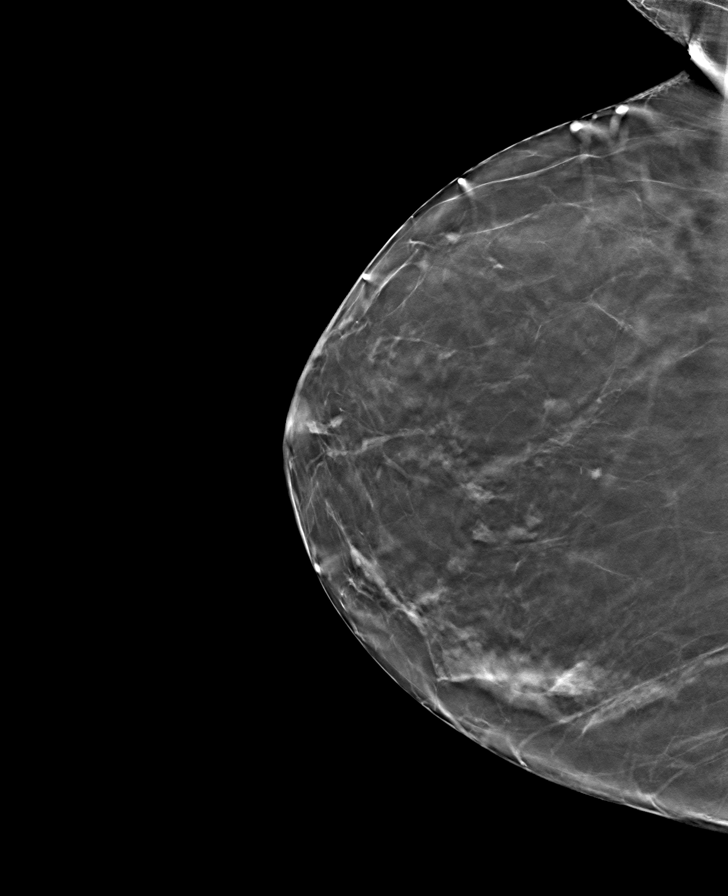

[L MLO tomo · tomo slice 49/96.0]
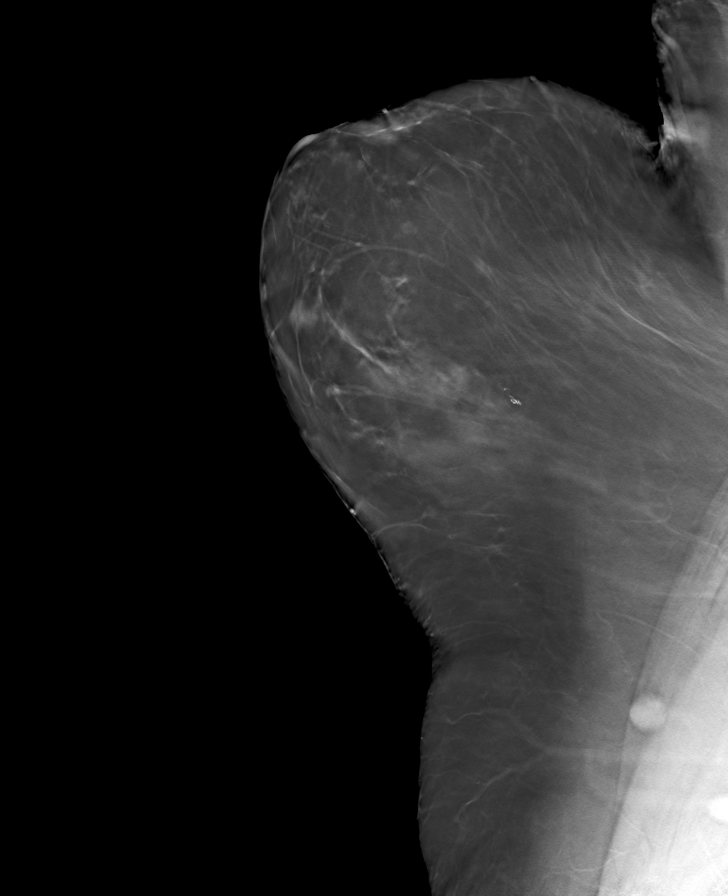

[R MLO tomo · tomo slice 43/85.0]
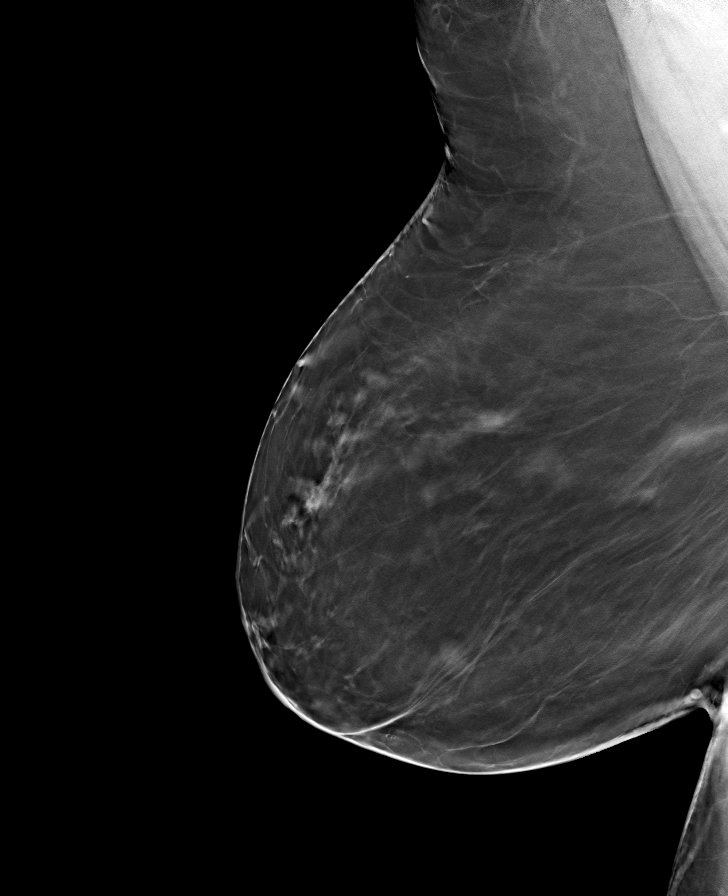

[R CC tomo · tomo slice 37/73.0]
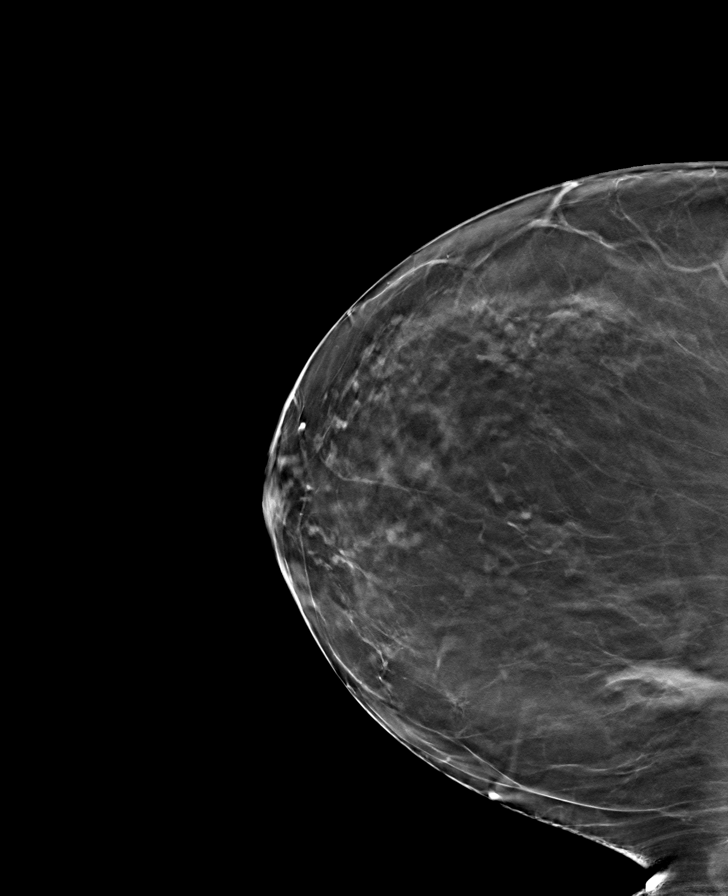

[8 of 24 positions shown; findings below may reference images not displayed]

ACR Breast Density Category b: There are scattered areas of
fibroglandular density.
FINDINGS: There are no findings suspicious for malignancy. Images were
processed with CAD.
IMPRESSION: No mammographic evidence of malignancy. A result letter of this
screening mammogram will be mailed directly to the patient.

RECOMMENDATION:
Screening mammogram in one year. (Code:[id number])

BI-RADS CATEGORY  1: Negative.

## 2020-12-08 ENCOUNTER — Encounter (INDEPENDENT_AMBULATORY_CARE_PROVIDER_SITE_OTHER): Payer: Self-pay | Admitting: Nurse Practitioner

## 2021-01-04 ENCOUNTER — Ambulatory Visit (INDEPENDENT_AMBULATORY_CARE_PROVIDER_SITE_OTHER): Payer: Medicare PPO | Admitting: Nurse Practitioner

## 2021-01-04 ENCOUNTER — Other Ambulatory Visit: Payer: Self-pay

## 2021-01-04 ENCOUNTER — Encounter (INDEPENDENT_AMBULATORY_CARE_PROVIDER_SITE_OTHER): Payer: Self-pay | Admitting: Nurse Practitioner

## 2021-01-04 VITALS — BP 136/83 | HR 71 | Resp 16 | Ht 63.0 in

## 2021-01-04 DIAGNOSIS — I890 Lymphedema, not elsewhere classified: Secondary | ICD-10-CM | POA: Diagnosis not present

## 2021-01-04 DIAGNOSIS — E119 Type 2 diabetes mellitus without complications: Secondary | ICD-10-CM | POA: Diagnosis not present

## 2021-01-04 DIAGNOSIS — I10 Essential (primary) hypertension: Secondary | ICD-10-CM

## 2021-01-04 DIAGNOSIS — E782 Mixed hyperlipidemia: Secondary | ICD-10-CM | POA: Diagnosis not present

## 2021-01-04 DIAGNOSIS — R0989 Other specified symptoms and signs involving the circulatory and respiratory systems: Secondary | ICD-10-CM

## 2021-01-04 NOTE — Progress Notes (Signed)
Subjective:    Patient ID: Nancy Riley, female    DOB: 12/14/1955, 65 y.o.   MRN: [id number] Chief Complaint  Patient presents with   New Patient (Initial Visit)    Ref Marvis Moeller severe lymphedema    Nancy Riley is a 65 year old female seen for evaluation of leg pain and leg swelling. The patient first noticed the swelling remotely. The swelling is associated with pain and discoloration. The pain and swelling worsens with prolonged dependency and improves with elevation. The pain is unrelated to activity.  The patient notes that in the morning the legs are significantly improved but they steadily worsened throughout the course of the day. The patient also notes a steady worsening of the discoloration in the ankle and shin area.   The patient denies claudication symptoms.  The patient denies symptoms consistent with rest pain.  The patient denies and extensive history of DJD and LS spine disease.  The patient has no had any past angiography, interventions or vascular surgery.  Elevation makes the leg symptoms better, dependency makes them much worse. There is no history of ulcerations. The patient denies any recent changes in medications.  The patient has not been wearing graduated compression.  The patient denies a history of DVT or PE. There is no prior history of phlebitis. There is no history of primary lymphedema.  No history of malignancies. No history of trauma or groin or pelvic surgery. There is no history of radiation treatment to the groin or pelvis  The patient denies amaurosis fugax or recent TIA symptoms. There are no recent neurological changes noted. The patient denies recent episodes of angina or shortness of breath    Review of Systems  Cardiovascular:  Positive for leg swelling.      Objective:   Physical Exam Vitals reviewed.  Constitutional:      Appearance: She is obese.  HENT:     Head: Normocephalic.  Cardiovascular:     Rate and Rhythm: Normal  rate.     Pulses: Decreased pulses.  Pulmonary:     Effort: Pulmonary effort is normal.  Musculoskeletal:     Right lower leg: 2+ Edema present.     Left lower leg: 2+ Edema present.  Neurological:     Mental Status: She is alert and oriented to person, place, and time.  Psychiatric:        Mood and Affect: Mood normal.        Behavior: Behavior normal.        Thought Content: Thought content normal.        Judgment: Judgment normal.    BP 136/83 (BP Location: Left Arm)   Pulse 71   Resp 16   Ht 5\' 3"  (1.6 m)   BMI 53.14 kg/m   Past Medical History:  Diagnosis Date   Diabetes mellitus without complication (HCC)    DJD (degenerative joint disease)    GERD (gastroesophageal reflux disease)    Hypercholesteremia    Hypertension    Morbid obesity with BMI of 50.0-59.9, adult (HCC)    Neuropathy    Phlegmon    ventral epidural phlegmon identified on lumbar spine MRI at [facility] in Nov 2020   Plantar fasciitis    Sciatica    Spondylodiscitis    lumbar spine (Nov 2020 at [facility])    Social History   Socioeconomic History   Marital status: Single    Spouse name: Not on file   Number of children: Not on file  Years of education: Not on file   Highest education level: Not on file  Occupational History   Not on file  Tobacco Use   Smoking status: Never   Smokeless tobacco: Never  Substance and Sexual Activity   Alcohol use: Not Currently   Drug use: Never   Sexual activity: Not on file  Other Topics Concern   Not on file  Social History Narrative   Not on file   Social Determinants of Health   Financial Resource Strain: Not on file  Food Insecurity: Not on file  Transportation Needs: Not on file  Physical Activity: Not on file  Stress: Not on file  Social Connections: Not on file  Intimate Partner Violence: Not on file    Past Surgical History:  Procedure Laterality Date   BREAST EXCISIONAL BIOPSY Left 1970   negative   COLONOSCOPY      Family History   Problem Relation Age of Onset   Breast cancer Neg Hx    Colon cancer Neg Hx     No Known Allergies  CBC Latest Ref Rng & Units 09/27/2019 06/15/2019 06/07/2019  WBC 4.0 - 10.5 K/uL 5.5 3.4(L) 4.3  Hemoglobin 12.0 - 15.0 g/dL 11.3(L) 11.0(L) 10.6(L)  Hematocrit 36.0 - 46.0 % 35.3(L) 35.9(L) 34.9(L)  Platelets 150 - 400 K/uL 350 339 347      CMP     Component Value Date/Time   NA 135 09/27/2019 0048   NA 137 03/05/2013 1643   K 3.9 09/27/2019 0048   K 4.0 03/05/2013 1643   CL 100 09/27/2019 0048   CL 103 03/05/2013 1643   CO2 26 09/27/2019 0048   CO2 29 03/05/2013 1643   GLUCOSE 128 (H) 09/27/2019 0048   GLUCOSE 121 (H) 03/05/2013 1643   BUN 18 09/27/2019 0048   BUN 16 03/05/2013 1643   CREATININE 1.01 (H) 09/27/2019 0048   CREATININE 1.11 03/05/2013 1643   CALCIUM 9.0 09/27/2019 0048   CALCIUM 8.9 03/05/2013 1643   PROT 7.3 06/15/2019 1800   PROT 8.2 03/05/2013 1643   ALBUMIN 3.9 06/15/2019 1800   ALBUMIN 3.5 03/05/2013 1643   AST 22 06/15/2019 1800   AST 21 03/05/2013 1643   ALT 26 06/15/2019 1800   ALT 16 03/05/2013 1643   ALKPHOS 113 06/15/2019 1800   ALKPHOS 120 03/05/2013 1643   BILITOT 0.6 06/15/2019 1800   BILITOT 0.2 03/05/2013 1643   GFRNONAA 60 (L) 09/27/2019 0048   GFRNONAA 56 (L) 03/05/2013 1643   GFRAA >60 09/27/2019 0048   GFRAA >60 03/05/2013 1643     No results found.     Assessment & Plan:   1. Lymphedema I have had a long discussion with the patient regarding swelling and why it  causes symptoms.  Patient will begin wearing graduated compression stockings class 1 (20-30 mmHg) on a daily basis a prescription was given. The patient will  beginning wearing the stockings first thing in the morning and removing them in the evening. The patient is instructed specifically not to sleep in the stockings.   In addition, behavioral modification will be initiated.  This will include frequent elevation, use of over the counter pain medications and  exercise such as walking.  I have reviewed systemic causes for chronic edema such as liver, kidney and cardiac etiologies.  The patient denies problems with these organ systems.    Consideration for a lymph pump will also be made based upon the effectiveness of conservative therapy.  This would help  to improve the edema control and prevent sequela such as ulcers and infections   Patient should undergo duplex ultrasound of the venous system to ensure that DVT or reflux is not present.  The patient will follow-up with me after the ultrasound.    2. Controlled type 2 diabetes mellitus without complication, without long-term current use of insulin (HCC) Continue hypoglycemic medications as already ordered, these medications have been reviewed and there are no changes at this time.  Hgb A1C to be monitored as already arranged by primary service   3. Hypertension, essential Continue antihypertensive medications as already ordered, these medications have been reviewed and there are no changes at this time.   4. Hyperlipidemia, mixed Continue statin as ordered and reviewed, no changes at this time   5. Decreased pulses in feet On physical exam the patient does have decreased pedal pulses.  We will also perform ABIs to ensure there is no issues with arterial circulation.    Current Outpatient Medications on File Prior to Visit  Medication Sig Dispense Refill   aspirin EC 81 MG tablet Take 81 mg by mouth daily.     cyclobenzaprine (FLEXERIL) 5 MG tablet Take 5 mg by mouth 2 (two) times daily as needed for muscle spasms.     diclofenac Sodium (VOLTAREN) 1 % GEL Apply topically 4 (four) times daily.     gabapentin (NEURONTIN) 100 MG capsule Take 100 mg by mouth 3 (three) times daily.     hydrochlorothiazide (HYDRODIURIL) 25 MG tablet Take 25 mg by mouth daily.     lisinopril (PRINIVIL,ZESTRIL) 10 MG tablet Take 10 mg by mouth daily.     loratadine (CLARITIN) 10 MG tablet Take 10 mg by mouth  daily.     lovastatin (MEVACOR) 20 MG tablet Take 20 mg by mouth every evening.     metFORMIN (GLUCOPHAGE-XR) 500 MG 24 hr tablet Take 500 mg by mouth daily.     naproxen sodium (ALEVE) 220 MG tablet Take 220 mg by mouth daily as needed (pain).     omeprazole (PRILOSEC) 20 MG capsule Take 20 mg by mouth daily.     potassium chloride (MICRO-K) 10 MEQ CR capsule Take 10 mEq by mouth daily.     predniSONE (DELTASONE) 10 MG tablet Take 1 tablet (10 mg total) by mouth daily. 6,5,4,3,2,1 six day taper 21 tablet 0   celecoxib (CELEBREX) 200 MG capsule Take 200 mg by mouth 2 (two) times daily with a meal. (Patient not taking: Reported on 01/04/2021)     HYDROcodone-acetaminophen (NORCO) 5-325 MG tablet Take 1 tablet by mouth every 4 (four) hours as needed for moderate pain. (Patient not taking: Reported on 01/04/2021) 20 tablet 0   ketorolac (TORADOL) 10 MG tablet Take 1 tablet (10 mg total) by mouth every 8 (eight) hours. (Patient not taking: Reported on 01/04/2021) 15 tablet 0   ranitidine (ZANTAC) 150 MG tablet Take 150 mg by mouth 2 (two) times daily. (Patient not taking: Reported on 01/04/2021)     traMADol (ULTRAM) 50 MG tablet Take 1 tablet (50 mg total) by mouth 2 (two) times daily. (Patient not taking: Reported on 01/04/2021) 10 tablet 0   No current facility-administered medications on file prior to visit.    There are no Patient Instructions on file for this visit. No follow-ups on file.   Georgiana Spinner, NP

## 2021-01-31 ENCOUNTER — Other Ambulatory Visit (INDEPENDENT_AMBULATORY_CARE_PROVIDER_SITE_OTHER): Payer: Self-pay | Admitting: Nurse Practitioner

## 2021-01-31 DIAGNOSIS — I890 Lymphedema, not elsewhere classified: Secondary | ICD-10-CM

## 2021-01-31 DIAGNOSIS — R0989 Other specified symptoms and signs involving the circulatory and respiratory systems: Secondary | ICD-10-CM

## 2021-02-01 ENCOUNTER — Ambulatory Visit (INDEPENDENT_AMBULATORY_CARE_PROVIDER_SITE_OTHER): Payer: Medicare PPO

## 2021-02-01 ENCOUNTER — Other Ambulatory Visit: Payer: Self-pay

## 2021-02-01 ENCOUNTER — Ambulatory Visit (INDEPENDENT_AMBULATORY_CARE_PROVIDER_SITE_OTHER): Payer: Medicare PPO | Admitting: Nurse Practitioner

## 2021-02-01 VITALS — BP 142/78 | HR 76 | Ht 63.0 in | Wt 320.0 lb

## 2021-02-01 DIAGNOSIS — R0989 Other specified symptoms and signs involving the circulatory and respiratory systems: Secondary | ICD-10-CM

## 2021-02-01 DIAGNOSIS — I890 Lymphedema, not elsewhere classified: Secondary | ICD-10-CM

## 2021-02-01 DIAGNOSIS — E119 Type 2 diabetes mellitus without complications: Secondary | ICD-10-CM

## 2021-02-10 ENCOUNTER — Encounter (INDEPENDENT_AMBULATORY_CARE_PROVIDER_SITE_OTHER): Payer: Self-pay | Admitting: Nurse Practitioner

## 2021-02-10 NOTE — Progress Notes (Signed)
Subjective:    Patient ID: Nancy Riley, female    DOB: 05/19/1956, 65 y.o.   MRN: [id number] Chief Complaint  Patient presents with   Follow-up    Pt conv [place]    Nancy Riley is a 65 year old female that returns to the office for followup evaluation regarding leg swelling.  The swelling has persisted and the pain associated with swelling continues. There have not been any interval development of a ulcerations or wounds.  Since the previous visit the patient has been wearing graduated compression stockings and has noted little if any improvement in the lymphedema. The patient has been using compression routinely morning until night.  The patient also states elevation during the day and exercise is being done too.  She has elevated blood that she is also utilizing.  The patient also had nonpalpable pulses as well.  Today noninvasive study showed ABI 0.96 on the right and 1.09 on the left.  Patient has triphasic/biphasic waveforms with good toe waveforms bilaterally.  There is no evidence of DVT or superficial thrombophlebitis in the bilateral lower extremities.  There is also no evidence of deep venous insufficiency or superficial venous reflux in the bilateral lower     Review of Systems  Cardiovascular:  Positive for leg swelling.  All other systems reviewed and are negative.     Objective:   Physical Exam Vitals reviewed.  Constitutional:      Appearance: She is obese.  Cardiovascular:     Rate and Rhythm: Normal rate.     Comments: Nonpalpable pulses Pulmonary:     Effort: Pulmonary effort is normal.  Musculoskeletal:     Right lower leg: 2+ Edema present.     Left lower leg: 2+ Edema present.  Neurological:     Mental Status: She is alert and oriented to person, place, and time.  Psychiatric:        Mood and Affect: Mood normal.        Behavior: Behavior normal.        Thought Content: Thought content normal.        Judgment: Judgment normal.    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 at [facility] in Nov 2020   Plantar fasciitis    Sciatica    Spondylodiscitis    lumbar spine (Nov 2020 at [facility])    Social History   Socioeconomic History   Marital status: Single    Spouse name: Not on file   Number of children: Not on file   Years of education: Not on file   Highest education level: Not on file  Occupational History   Not on file  Tobacco Use   Smoking status: Never   Smokeless tobacco: Never  Substance and Sexual Activity   Alcohol use: Not Currently   Drug use: Never   Sexual activity: Not on file  Other Topics Concern   Not on file  Social History Narrative   Not on file   Social Determinants  of Health   Financial Resource Strain: Not on file  Food Insecurity: Not on file  Transportation Needs: Not on file  Physical Activity: Not on file  Stress: Not on file  Social Connections: Not on file  Intimate Partner Violence: Not on file    Past Surgical History:  Procedure Laterality Date   BREAST EXCISIONAL BIOPSY Left 1970   negative   COLONOSCOPY      Family History  Problem Relation Age of Onset   Breast cancer Neg Hx    Colon cancer Neg Hx     No Known Allergies  CBC Latest Ref Rng & Units 09/27/2019 06/15/2019 06/07/2019  WBC 4.0 - 10.5 K/uL 5.5 3.4(L) 4.3  Hemoglobin 12.0 - 15.0 g/dL 11.3(L) 11.0(L) 10.6(L)  Hematocrit 36.0 - 46.0 % 35.3(L) 35.9(L) 34.9(L)  Platelets 150 - 400 K/uL 350 339 347      CMP     Component Value Date/Time   NA 135 09/27/2019 0048   NA 137 03/05/2013 1643   K 3.9 09/27/2019 0048    K 4.0 03/05/2013 1643   CL 100 09/27/2019 0048   CL 103 03/05/2013 1643   CO2 26 09/27/2019 0048   CO2 29 03/05/2013 1643   GLUCOSE 128 (H) 09/27/2019 0048   GLUCOSE 121 (H) 03/05/2013 1643   BUN 18 09/27/2019 0048   BUN 16 03/05/2013 1643   CREATININE 1.01 (H) 09/27/2019 0048   CREATININE 1.11 03/05/2013 1643   CALCIUM 9.0 09/27/2019 0048   CALCIUM 8.9 03/05/2013 1643   PROT 7.3 06/15/2019 1800   PROT 8.2 03/05/2013 1643   ALBUMIN 3.9 06/15/2019 1800   ALBUMIN 3.5 03/05/2013 1643   AST 22 06/15/2019 1800   AST 21 03/05/2013 1643   ALT 26 06/15/2019 1800   ALT 16 03/05/2013 1643   ALKPHOS 113 06/15/2019 1800   ALKPHOS 120 03/05/2013 1643   BILITOT 0.6 06/15/2019 1800   BILITOT 0.2 03/05/2013 1643   GFRNONAA 60 (L) 09/27/2019 0048   GFRNONAA 56 (L) 03/05/2013 1643   GFRAA >60 09/27/2019 0048   GFRAA >60 03/05/2013 1643     VAS [place] ABI WITH/WO TBI  Result Date: 02/08/2021  LOWER EXTREMITY DOPPLER STUDY Patient Name:  Nancy Riley  Date of Exam:   02/01/2021 Medical Rec #: [id number]          Accession #:    [id number] Date of Birth: 07-20-1955         Patient Gender: F Patient Age:   30 years Exam Location:  [facility] Procedure:      VAS [place] ABI WITH/WO TBI Referring Phys: Vivia Birmingham Makiyah Zentz --------------------------------------------------------------------------------  Indications: Decreased pulses felt at the level of the Ankle.  Performing Technologist: Debbe Bales RVS  Examination Guidelines: A complete evaluation includes at minimum, Doppler waveform signals and systolic blood pressure reading at the level of bilateral brachial, anterior tibial, and posterior tibial arteries, when vessel segments are accessible. Bilateral testing is considered an integral part of a complete examination. Photoelectric Plethysmograph (PPG) waveforms and toe systolic pressure readings are included as required and additional duplex testing as needed. Limited examinations for reoccurring  indications may be performed as noted.  ABI Findings: +---------+------------------+-----+---------+--------+ Right    Rt Pressure (mmHg)IndexWaveform Comment  +---------+------------------+-----+---------+--------+ Brachial 165                                      +---------+------------------+-----+---------+--------+ ATA  173                    triphasic.98      +---------+------------------+-----+---------+--------+ PTA      162               0.92 biphasic          +---------+------------------+-----+---------+--------+ Great Toe196               1.11 Normal            +---------+------------------+-----+---------+--------+ +---------+------------------+-----+---------+-------+ Left     Lt Pressure (mmHg)IndexWaveform Comment +---------+------------------+-----+---------+-------+ Brachial 176                                     +---------+------------------+-----+---------+-------+ ATA      192                    triphasic1.09    +---------+------------------+-----+---------+-------+ PTA      144               0.82 biphasic         +---------+------------------+-----+---------+-------+ Great Toe194               1.10 Normal           +---------+------------------+-----+---------+-------+ +-------+-----------+-----------+------------+------------+ ABI/TBIToday's ABIToday's TBIPrevious ABIPrevious TBI +-------+-----------+-----------+------------+------------+ Right  .98        1.11                                +-------+-----------+-----------+------------+------------+ Left   1.09       1.10                                +-------+-----------+-----------+------------+------------+  Summary: Right: Resting right ankle-brachial index is within normal range. No evidence of significant right lower extremity arterial disease. Left: Resting left ankle-brachial index is within normal range. No evidence of significant left lower extremity  arterial disease. The left toe-brachial index is normal.  *See table(s) above for measurements and observations.  Electronically signed by Levora DredgeGregory Schnier MD on 02/08/2021 at 5:12:10 PM.    Final        Assessment & Plan:   1. Lymphedema  No surgery or intervention at this point in time.    I have reviewed my discussion with the patient regarding lymphedema and why it  causes symptoms.  Patient will continue wearing graduated compression stockings class 1 (20-30 mmHg) on a daily basis a prescription was given. The patient is reminded to put the stockings on first thing in the morning and removing them in the evening. The patient is instructed specifically not to sleep in the stockings.   In addition, behavioral modification throughout the day will be continued.  This will include frequent elevation (such as in a recliner), use of over the counter pain medications as needed and exercise such as walking.  I have reviewed systemic causes for chronic edema such as liver, kidney and cardiac etiologies and there does not appear to be any significant changes in these organ systems over the past year.  The patient is under the impression that these organ systems are all stable and unchanged.    The patient will continue aggressive use of the  lymph pump.  This will continue to improve the edema control and prevent  sequela such as ulcers and infections.   The patient will follow-up in 6 months.  We will also refer the patient to occupational therapy to the lymphedema clinic to give additional tactics for lymphedema.  Patient has been previously and it was a very positive factor in her care. - Ambulatory referral to Occupational Therapy    2. Decreased pedal pulses Despite decreased pedal pulses the patient has noninvasive study studies that showed normal arterial flow.  No further studies necessary.  3. Controlled type 2 diabetes mellitus without complication, without long-term current use of insulin  (HCC) Continue hypoglycemic medications as already ordered, these medications have been reviewed and there are no changes at this time.  Hgb A1C to be monitored as already arranged by primary service     Current Outpatient Medications on File Prior to Visit  Medication Sig Dispense Refill   aspirin EC 81 MG tablet Take 81 mg by mouth daily.     celecoxib (CELEBREX) 200 MG capsule Take 200 mg by mouth 2 (two) times daily with a meal.     cyclobenzaprine (FLEXERIL) 5 MG tablet Take 5 mg by mouth 2 (two) times daily as needed for muscle spasms.     diclofenac Sodium (VOLTAREN) 1 % GEL Apply topically 4 (four) times daily.     gabapentin (NEURONTIN) 100 MG capsule Take 100 mg by mouth 3 (three) times daily.     hydrochlorothiazide (HYDRODIURIL) 25 MG tablet Take 25 mg by mouth daily.     HYDROcodone-acetaminophen (NORCO) 5-325 MG tablet Take 1 tablet by mouth every 4 (four) hours as needed for moderate pain. 20 tablet 0   ketorolac (TORADOL) 10 MG tablet Take 1 tablet (10 mg total) by mouth every 8 (eight) hours. 15 tablet 0   lisinopril (PRINIVIL,ZESTRIL) 10 MG tablet Take 10 mg by mouth daily.     loratadine (CLARITIN) 10 MG tablet Take 10 mg by mouth daily.     lovastatin (MEVACOR) 20 MG tablet Take 20 mg by mouth every evening.     metFORMIN (GLUCOPHAGE-XR) 500 MG 24 hr tablet Take 500 mg by mouth daily.     naproxen sodium (ALEVE) 220 MG tablet Take 220 mg by mouth daily as needed (pain).     omeprazole (PRILOSEC) 20 MG capsule Take 20 mg by mouth daily.     potassium chloride (MICRO-K) 10 MEQ CR capsule Take 10 mEq by mouth daily.     predniSONE (DELTASONE) 10 MG tablet Take 1 tablet (10 mg total) by mouth daily. 6,5,4,3,2,1 six day taper 21 tablet 0   ranitidine (ZANTAC) 150 MG tablet Take 150 mg by mouth 2 (two) times daily.     traMADol (ULTRAM) 50 MG tablet Take 1 tablet (50 mg total) by mouth 2 (two) times daily. 10 tablet 0   No current facility-administered medications on file  prior to visit.    There are no Patient Instructions on file for this visit. No follow-ups on file.   Georgiana Spinner, NP

## 2021-03-06 ENCOUNTER — Ambulatory Visit: Payer: Medicare PPO | Admitting: Occupational Therapy

## 2021-03-08 ENCOUNTER — Other Ambulatory Visit: Payer: Self-pay

## 2021-03-08 ENCOUNTER — Encounter: Payer: Self-pay | Admitting: Occupational Therapy

## 2021-03-08 ENCOUNTER — Encounter: Payer: Medicare PPO | Admitting: Occupational Therapy

## 2021-03-08 ENCOUNTER — Ambulatory Visit: Payer: Medicare PPO | Attending: Nurse Practitioner | Admitting: Occupational Therapy

## 2021-03-08 DIAGNOSIS — I890 Lymphedema, not elsewhere classified: Secondary | ICD-10-CM | POA: Insufficient documentation

## 2021-03-08 NOTE — Patient Instructions (Signed)

## 2021-03-09 NOTE — Therapy (Signed)
[facility] MAIN [facility] SERVICES [address] 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   Subjective Assessment - 03/08/21 1134     Subjective  Nancy Riley is referred to Occupational Therapy by Sheppard Plumber, NP, for eveluation and treatment of BLE lymphedema. Nancy. Riley is well known to this OT as she underwent Intensive and Self-management Phase Complete Decongestive Therapy  (DT) for lymphedema management from 12/02/2018 through 04/26/2019, and also from 08/28/19 through 10/11/19. Pt achieved OT goals and achieved total limb volume reductions bilaterally, 13.86% on the R below the knee and 3.4% on the L below the knee. Pt has achieved significantly more volume reduction, but did not retain clinical gains after hospitalization for back infection interrupted OT course and Pt's pain increased and functional mobility and activity level further declined. Pt returns toay with goal of repeating Intensive Phase CDT to reduce leg swelling. She presents in manual transport wc  wearing  shorts and custom, flat knit, compression knee  high compression garments. Compression stockings are now too small and are worn out. They do not extend above calf level at present. Discussed need to have daily caregiver assistance with lymphedema self-care home program due to inability to reach feet and legs to perform compression bandaging, skin care and simple self MLD throughout Intensive Phase of Complete Decongestive Therapy. Without daily CG assistance with this protocol ongoing, prognosis for reducing swelling and limiting lymphedema progression is poor.    Pertinent History Spondylodiscitis, BLE lymphedema, HTN, DM, neuropathy, DJD, arthritis, sciatica,  chronic back pain, morbid obesity, (BMI 50-59.9)    Limitations Difficulty walking, impaired transfers/functional mobility, chronic back DJD and knee  OA pain , chronic leg swelling decreased strength, impaired balance, impaired sensation (neuropathy in feet), increased infection risk, increased fall risk,    Repetition Increases Symptoms    Special Tests + Stemmer bilaterally    Nancy Riley Stated Goals get swelling back down and replace worn compression stockings to limit LE progression    Currently in Pain? Yes    Pain Score 5     Pain Descriptors / Indicators Sore;Tender;Heaviness;Tiring;Tightness;Pressure;Sharp;Discomfort;Aching    Pain Onset Other (comment)   chronic   Pain Score 5    Pain Location Back    Pain Score 10    Pain Location Knee               [facility] OT Assessment - 03/08/21 1435       Assessment   Medical Diagnosis Severe, stage II, BLE lymphedema 2/2 Morbid obesity (with BMI  of 60.0-69.9) suspected venous insufficiency, dependent positioning throughout waking hours.    Referring Provider (OT) Sheppard Plumber, NP    Hand Dominance Right    Prior Therapy yes with this OT from 12/02/18-04/26/19, and resumed OT      after [facility] hospitalization      with home IV antibiotics  from 08/22/19 through 10/11/19. Pt was fit with custom, flat knit , knee length ccl 3 Elvarex Classic  compression stockings for daytime use. No HOS devices issued due to insurance benefits limited      Precautions   Precautions Knee;Fall    Precaution Comments hx cellulitis      Balance Screen   Has the Nancy Riley fallen in the past 6 months No    Has the Nancy Riley had a decrease in activity level because of a fear of falling?  Yes      Home  Environment   Family/Nancy Riley expects to be discharged to: Private residence    Living Arrangements Alone    Available Help at Discharge Family    Type of Home Mobile home    Home Access Stairs    Entrance Stairs-Rails Right;Left    Entrance Stairs-Number of Steps 5?    Home Layout One level    Bathroom Statistician No    Home Biochemist, clinical - 2 wheels;[place] Mor - single point    Lives With --   daughter no longer lives with PPt. 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    Leisure family    Comments would like to be able to get out of the house more      IADL   Prior Level of Function Shopping modified independent for brief trips with frequent rest breaks    Shopping Needs to be accompanied on any shopping trip    Prior Level of Function Light Housekeeping max A    Light Housekeeping Needs help with all home maintenance tasks;Performs light daily tasks but cannot maintain acceptable level of cleanliness    Prior Level of Function Meal Prep frequent seated breaks for cold meal    Meal Prep Able to complete simple cold meal and snack prep;Able to complete simple warm meal prep    Prior Level of Function Community Mobility drives    Community Mobility Drives own vehicle;Relies on family or friends  for transportation      Mobility   Mobility Status History of falls;Needs assist      Vision - History   Baseline Vision Wears glasses all the time      Activity Tolerance   Activity Tolerance Tolerates 10-20 min activity with multiple rests      Cognition   Overall Cognitive Status Within Functional Limits for tasks assessed    Behaviors --   flat affect today             Severe, Stage II, BLE lymphedema 2/2 Morbid Obesity and suspected CVI  Skin  Description Hyper-Keratosis Peau' de Orange Shiny Tight Fibrotic/ Indurated Fatty Doughy spongy    x  x severe x x   x   Skin dry Flaky WNL Macerated   slight      Color Redness Present Pallor  Blanching Hemosiderin Staining Mottled           Odor Malodorous Yeast Fungal infection  Absent      x   Temperature Warm Cool wnl    x     Pitting Edema   1+ 2+ 3+ 4+ Non-pitting        x   Girth Symmetrical Asymmetrical                   Distribution      Ankles to buttocks       Stemmer Sign Positive Negative   x    Lymphorrhea History Of:  Present Absent         Wounds History Of Present Absent Venous Arterial Pressure Mechanical     x        Signs of Infection Redness Warmth Erythema Acute Swelling Drainage Borders                    Sensation Light Touch Deep pressure Hypersensitivty   Present Impaired Present Impaired Absent Impaired   x  x       Nails WNL   Fungus nail dystrophy     x   Hair Growth Symmetrical Asymmetrical       Skin Creases Base of toes  Ankles   Base of Fingers Medial Thighs         Abdominal pannus Lobules  Face/neck   x x  x x x                   OT Education - 03/08/21 1456     Education Details Provided Pt education regarding lymphatic structure and function, etiology, onset patterns and stages of progression. Taught interaction between blood circulatory system and lymphatic circulation.Discussed  impact of gravity and obesity on lymphatic  function. Outlined Complete Decongestive Therapy (CDT)  as standard of care and provided in depth information regarding 4 primary components of both Intensive and Self Management Phases, including Manual Lymph Drainage (MLD), compression wrapping and garments, skin care, and therapeutic exercise.   Homero Fellers discussion of high burden of care and contributing impact of existing co morbidities. We discussed  Importance of daily, ongoing LE self-care essential to retaining clinical gains and limiting progression.    Person(s) Educated Nancy Riley    Methods Explanation;Demonstration    Comprehension Verbalized understanding;Need further instruction;Returned demonstration                 OT Long Term Goals - 03/09/21 1208       OT LONG TERM GOAL #1   Title Pt will demonstrate understanding of lymphedema precautions and signs/ symptoms of cellulitisby identifying 6 items for each using printed Lymphedema Workbook for reference PRN (modified independence) to limit LE progression and infection risk.    Baseline minA    Time 4    Period Days    Status New    Target Date --   4th OT Rx visit     OT LONG TERM GOAL #2   Title Pt will require Max assist from caregiver who will be able to apply multilayer, gradient compression wraps using correct techniques after skilled training to reduce limb volume and limity LE progression.    Baseline dependent    Time 4    Period Days    Status New    Target Date --   4th OT Rx visit     OT LONG TERM GOAL #3   Title Pt  will achieve 10% limb volume reductions  bilaterally from ankle to tibial tuberosity during Intensive Phase CDT to return limbs to typical size and shape ,. reduce infection risk and improve basic andf instrumental ALDs performance.    Time 12    Period Weeks    Status New    Target Date 06/07/21      OT LONG TERM GOAL #4   Title Pt will consistently perform all LE self -care home protocols ( simple self MLD, skin care, therapeutic exercise,  compression therapies) daily  with max CG assistance to to achieve max edema reduction, to enanble functional improvements,  including fitting preferred street shoes and standard sized clothing, and to achieve increased AROM and reduced falls risk.    Baseline Max A    Time 12    Period Weeks    Status New    Target Date 06/07/21                   Plan - 03/08/21 1458     Clinical Impression Statement Nancy Riley presents with severe, stage II , BLE lymphedema 2/2 morbid obesity, dependent positioning and impaired mobility. Contributing factors include decreased endurance, chronic arthritis pain and inflammation of B knees, suspected CVI. Chronic leg swelling and associated pain limits Pt's functional performance in all occupational domains, including basic and instrumental self -care, leisure pursuits and productive activities, social participation, role performance and body image. Nancy Riley will benefit from repeat Intensive and Management Phase courses of CDT to reduce limb volume, to limit infection risk, improve functional mobility and to limit progression. Without skilled OT, Lymphedema will worsen and Pt will experience further functional decline.    OT Occupational Profile and History Comprehensive Assessment- Review of records and extensive additional review of physical, cognitive, psychosocial history related to current functional performance    Occupational performance deficits (Please refer to evaluation for details): ADL's;Work;IADL's;Rest and Sleep;Leisure;Social Participation;Other   role performance   Body Structure / Function / Physical Skills ADL;Skin integrity;Endurance;Balance;Mobility;Strength;Edema;Obesity;Pain;Gait;ROM;Decreased knowledge of precautions;Decreased knowledge of use of DME;IADL   chronic pain   Clinical Decision Making Multiple treatment options, significant modification of task necessary    Comorbidities Affecting Occupational Performance: Presence  of comorbidities impacting occupational performance    Modification or Assistance to Complete Evaluation  Max significant modification of tasks or assist is necessary to complete    OT Frequency 2x / week    OT Duration 12 weeks   and PRN to complete CDT Intensive to BILATERAL legs   OT Treatment/Interventions Self-care/ADL training;Therapeutic exercise;Coping strategies training;Therapeutic activities;Manual lymph drainage;Energy conservation;Manual Therapy;DME and/or AE instruction;Compression bandaging;Nancy Riley/family education    Plan CDT to BLE, one leg at a time, RL:E first    OT Home Exercise Plan fit with BLE custom flat knit compression knee highs    Recommended Other Services Lymphedema management has a high burden of care, especially for Patients who have difficulty, or who are unable to reach their distal legs and feet.  reach their feet and distal legs to aapply bandages, compression garments, to bathe , inspect skin and groom nails. In this case because Pt is unable to perform these activities, daily caregiver assistance with the lymphedema self-care home program  between OT visits is essential for achieving clinical success. Without ongoing, daily caregiver assistance during compression bandaging, donning and doffing compression garments, skin care, and MLD , Pt's prognosis is poor. However, with consistent caregiver assistance with lymphedema home program prognosis for improved lymphedema control and reducing  infection risk and progression is good.    Consulted and Agree with Plan of Care Nancy Riley             Nancy Riley will benefit from skilled therapeutic intervention in order to improve the following deficits and impairments:   Body Structure / Function / Physical Skills: ADL, Skin integrity, Endurance, Balance, Mobility, Strength, Edema, Obesity, Pain, Gait, ROM, Decreased knowledge of precautions, Decreased knowledge of use of DME, IADL (chronic pain)       Visit  Diagnosis: Lymphedema, not elsewhere classified - Plan: Ot plan of care cert/re-cert    Problem List Nancy Riley Active Problem List   Diagnosis Date Noted   Osteoarthritis 10/04/2019   Spondylodiscitis 04/26/2019   Lymphedema 04/26/2019   Morbid obesity (HCC) 04/26/2019   BMI 45.0-49.9, adult (HCC) 08/20/2018   Controlled type 2 diabetes mellitus without complication, without long-term current use of insulin (HCC) 11/26/2017   GERD (gastroesophageal reflux disease) 11/26/2017   Hyperlipidemia, mixed 11/26/2017   Hypertension, essential 11/26/2017   Chest pain at rest 11/26/2017    Loel Dubonnet, Nancy, OTR/L, [facility] 03/09/21 12:15 PM  [facility] MAIN [facility] SERVICES [address] [place], [place], [address] Phone: [phone]   Fax:  [phone]  Name: Nancy Riley MRN: [id number] Date of Birth: [birth year]

## 2021-03-09 NOTE — Therapy (Signed)
[place] [facility] MAIN [facility] SERVICES [address] [place], [place], [address] Phone: [phone]   Fax:  [phone]  Occupational Therapy Evaluation  Patient Details  Name: Nancy Riley MRN: [id number] Date of Birth: May 14, 1956 Referring Provider (OT): Sheppard Plumber, NP   Encounter Date: 03/08/2021   OT End of Session - 03/08/21 1216     Visit Number 1    Number of Visits 36    Date for OT Re-Evaluation 06/07/21    OT Start Time 1100    OT Stop Time 1200    OT Time Calculation (min) 60 min    Activity Tolerance Patient tolerated treatment well;No increased pain;Patient limited by pain    Behavior During Therapy [facility] for tasks assessed/performed             Past Medical History:  Diagnosis Date   Diabetes mellitus without complication (HCC)    DJD (degenerative joint disease)    GERD (gastroesophageal reflux disease)    Hypercholesteremia    Hypertension    Morbid obesity with BMI of 50.0-59.9, adult (HCC)    Neuropathy    Phlegmon    ventral epidural phlegmon identified on lumbar spine MRI at [facility] in Nov 2020   Plantar fasciitis    Sciatica    Spondylodiscitis    lumbar spine (Nov 2020 at [facility])    Past Surgical History:  Procedure Laterality Date   BREAST EXCISIONAL BIOPSY Left 1970   negative   COLONOSCOPY      There were no vitals filed for this visit.   Subjective Assessment - 03/08/21 1134     Subjective  Nancy Riley is referred to Occupational Therapy by Sheppard Plumber, NP, for eveluation and treatment of BLE lymphedema. [name] is well known to this OT as she underwent Intensive and Self-management Phase Complete Decongestive Therapy  (DT) for lymphedema management from 12/02/2018 through 04/26/2019, and also from 08/28/19 through 10/11/19. Pt achieved OT goals and achieved total limb volume reductions bilaterally, 13.86% on the R below the knee and 3.4% on the L below the knee. Pt has achieved significantly more  volume reduction, but did not retain clinical gains after hospitalization for back infection interrupted OT course and Pt's pain increased and functional mobility and activity level further declined. Pt returns toay with goal of repeating Intensive Phase CDT to reduce leg swelling. She presents in manual transport wc  wearing  shorts and custom, flat knit, compression knee high compression garments. Compression stockings are now too small and are worn out. They do not extend above calf level at present. Discussed need to have daily caregiver assistance with lymphedema self-care home program due to inability to reach feet and legs to perform compression bandaging, skin care and simple self MLD throughout Intensive Phase of Complete Decongestive Therapy. Without daily CG assistance with this protocol ongoing, prognosis for reducing swelling and limiting lymphedema progression is poor.    Pertinent History Spondylodiscitis, BLE lymphedema, HTN, DM, neuropathy, DJD, arthritis, sciatica,  chronic back pain, morbid obesity, (BMI 50-59.9)    Limitations Difficulty walking, impaired transfers/functional mobility, chronic back DJD and knee  OA pain , chronic leg swelling decreased strength, impaired balance, impaired sensation (neuropathy in feet), increased infection risk, increased fall risk,    Repetition Increases Symptoms    Special Tests + Stemmer bilaterally    Patient Stated Goals get swelling back down and replace worn compression stockings to limit LE progression    Currently in Pain? Yes  Pain Score 5     Pain Descriptors / Indicators Sore;Tender;Heaviness;Tiring;Tightness;Pressure;Sharp;Discomfort;Aching    Pain Onset Other (comment)   chronic   Pain Score 5    Pain Location Back    Pain Score 10    Pain Location Knee               [facility] OT Assessment - 03/08/21 1435       Assessment   Medical Diagnosis Severe, stage II, BLE lymphedema 2/2 Morbid obesity (with BMI of 60.0-69.9)  suspected venous insufficiency, dependent positioning throughout waking hours.    Referring Provider (OT) Sheppard Plumber, NP    Hand Dominance Right    Prior Therapy yes with this OT from 12/02/18-04/26/19, and resumed OT      after [facility] hospitalization      with home IV antibiotics  from 08/22/19 through 10/11/19. Pt was fit with custom, flat knit , knee length ccl 3 Elvarex Classic compression stockings for daytime use. No HOS devices issued due to insurance benefits limited      Precautions   Precautions Knee;Fall    Precaution Comments hx cellulitis      Balance Screen   Has the patient fallen in the past 6 months No    Has the patient had a decrease in activity level because of a fear of falling?  Yes      Home  Environment   Family/patient expects to be discharged to: Private residence    Living Arrangements Alone    Available Help at Discharge Family    Type of Home Mobile home    Home Access Stairs    Entrance Stairs-Rails Right;Left    Entrance Stairs-Number of Steps 5?    Home Layout One level    Bathroom Statistician No    Home Biochemist, clinical - 2 wheels;[place] - single point    Lives With --   daughter no longer lives with PPt. 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    Leisure family    Comments would like to be able to get out of the house more      IADL   Prior Level of Function Shopping modified independent for brief trips with frequent rest breaks    Shopping Needs to be accompanied on any shopping trip    Prior Level of Function Light Housekeeping max A    Light Housekeeping Needs help with all home maintenance  tasks;Performs light daily tasks but cannot maintain acceptable level of cleanliness    Prior Level of Function Meal Prep frequent seated breaks for cold meal    Meal Prep Able to complete simple cold meal and snack prep;Able to complete simple warm meal prep    Prior Level of Function Community Mobility drives    Community Mobility Drives own vehicle;Relies on family or friends for transportation      Mobility   Mobility Status History of falls;Needs assist      Vision - History   Baseline Vision Wears glasses all the time      Activity Tolerance   Activity Tolerance Tolerates 10-20 min activity with multiple rests      Cognition   Overall Cognitive  Status Within Functional Limits for tasks assessed    Behaviors --   flat affect today                             OT Education - 03/08/21 1456     Education Details Provided Pt education regarding lymphatic structure and function, etiology, onset patterns and stages of progression. Taught interaction between blood circulatory system and lymphatic circulation.Discussed  impact of gravity and obesity on lymphatic function. Outlined Complete Decongestive Therapy (CDT)  as standard of care and provided in depth information regarding 4 primary components of both Intensive and Self Management Phases, including Manual Lymph Drainage (MLD), compression wrapping and garments, skin care, and therapeutic exercise.   Homero Fellers discussion of high burden of care and contributing impact of existing co morbidities. We discussed  Importance of daily, ongoing LE self-care essential to retaining clinical gains and limiting progression.    Person(s) Educated Patient    Methods Explanation;Demonstration    Comprehension Verbalized understanding;Need further instruction;Returned demonstration                 OT Long Term Goals - 03/09/21 1208       OT LONG TERM GOAL #1   Title Pt will demonstrate understanding of lymphedema  precautions and signs/ symptoms of cellulitisby identifying 6 items for each using printed Lymphedema Workbook for reference PRN (modified independence) to limit LE progression and infection risk.    Baseline minA    Time 4    Period Days    Status New    Target Date --   4th OT Rx visit     OT LONG TERM GOAL #2   Title Pt will require Max assist from caregiver who will be able to apply multilayer, gradient compression wraps using correct techniques after skilled training to reduce limb volume and limity LE progression.    Baseline dependent    Time 4    Period Days    Status New    Target Date --   4th OT Rx visit     OT LONG TERM GOAL #3   Title Pt will achieve 10% limb volume reductions  bilaterally from ankle to tibial tuberosity during Intensive Phase CDT to return limbs to typical size and shape ,. reduce infection risk and improve basic andf instrumental ALDs performance.    Time 12    Period Weeks    Status New    Target Date 06/07/21      OT LONG TERM GOAL #4   Title Pt will consistently perform all LE self -care home protocols ( simple self MLD, skin care, therapeutic exercise, compression therapies) daily  with max CG assistance to to achieve max edema reduction, to enanble functional improvements,  including fitting preferred street shoes and standard sized clothing, and to achieve increased AROM and reduced falls risk.    Baseline Max A    Time 12    Period Weeks    Status New    Target Date 06/07/21                   Plan - 03/08/21 1458     Clinical Impression Statement Edia Pursifull presents with severe, stage II , BLE lymphedema 2/2 morbid obesity, dependent positioning and impaired mobility. Contributing factors include decreased endurance, chronic arthritis pain and inflammation of B knees, suspected CVI. Chronic leg swelling and associated pain limits Pt's functional performance in all occupational  domains, including basic and instrumental self -care,  leisure pursuits and productive activities, social participation, role performance and body image. Ms Dellie Burns will benefit from repeat Intensive and Management Phase courses of CDT to reduce limb volume, to limit infection risk, improve functional mobility and to limit progression. Without skilled OT, Lymphedema will worsen and Pt will experience further functional decline.    OT Occupational Profile and History Comprehensive Assessment- Review of records and extensive additional review of physical, cognitive, psychosocial history related to current functional performance    Occupational performance deficits (Please refer to evaluation for details): ADL's;Work;IADL's;Rest and Sleep;Leisure;Social Participation;Other   role performance   Body Structure / Function / Physical Skills ADL;Skin integrity;Endurance;Balance;Mobility;Strength;Edema;Obesity;Pain;Gait;ROM;Decreased knowledge of precautions;Decreased knowledge of use of DME;IADL   chronic pain   Clinical Decision Making Multiple treatment options, significant modification of task necessary    Comorbidities Affecting Occupational Performance: Presence of comorbidities impacting occupational performance    Modification or Assistance to Complete Evaluation  Max significant modification of tasks or assist is necessary to complete    OT Frequency 2x / week    OT Duration 12 weeks   and PRN to complete CDT Intensive to BILATERAL legs   OT Treatment/Interventions Self-care/ADL training;Therapeutic exercise;Coping strategies training;Therapeutic activities;Manual lymph drainage;Energy conservation;Manual Therapy;DME and/or AE instruction;Compression bandaging;Patient/family education    Plan CDT to BLE, one leg at a time, RL:E first    OT Home Exercise Plan fit with BLE custom flat knit compression knee highs    Recommended Other Services Lymphedema management has a high burden of care, especially for Patients who have difficulty, or who are unable to reach  their distal legs and feet.  reach their feet and distal legs to aapply bandages, compression garments, to bathe , inspect skin and groom nails. In this case because Pt is unable to perform these activities, daily caregiver assistance with the lymphedema self-care home program  between OT visits is essential for achieving clinical success. Without ongoing, daily caregiver assistance during compression bandaging, donning and doffing compression garments, skin care, and MLD , Pt's prognosis is poor. However, with consistent caregiver assistance with lymphedema home program prognosis for improved lymphedema control and reducing infection risk and progression is good.    Consulted and Agree with Plan of Care Patient             Patient will benefit from skilled therapeutic intervention in order to improve the following deficits and impairments:   Body Structure / Function / Physical Skills: ADL, Skin integrity, Endurance, Balance, Mobility, Strength, Edema, Obesity, Pain, Gait, ROM, Decreased knowledge of precautions, Decreased knowledge of use of DME, IADL (chronic pain)       Visit Diagnosis: Lymphedema, not elsewhere classified - Plan: Ot plan of care cert/re-cert    Problem List Patient Active Problem List   Diagnosis Date Noted   Osteoarthritis 10/04/2019   Spondylodiscitis 04/26/2019   Lymphedema 04/26/2019   Morbid obesity (HCC) 04/26/2019   BMI 45.0-49.9, adult (HCC) 08/20/2018   Controlled type 2 diabetes mellitus without complication, without long-term current use of insulin (HCC) 11/26/2017   GERD (gastroesophageal reflux disease) 11/26/2017   Hyperlipidemia, mixed 11/26/2017   Hypertension, essential 11/26/2017   Chest pain at rest 11/26/2017   Loel Dubonnet, MS, OTR/L, [facility] 03/09/21 12:17 PM   [place] [facility] MAIN [facility] SERVICES [address] [place], [place], [address] Phone: [phone]   Fax:  [phone]  Name: LAILEE HOELZEL MRN: [id number] Date of Birth: 1955-09-27

## 2021-03-12 ENCOUNTER — Ambulatory Visit: Payer: Medicare PPO | Admitting: Occupational Therapy

## 2021-03-12 ENCOUNTER — Other Ambulatory Visit: Payer: Self-pay

## 2021-03-12 DIAGNOSIS — I890 Lymphedema, not elsewhere classified: Secondary | ICD-10-CM

## 2021-03-12 NOTE — Patient Instructions (Signed)

## 2021-03-12 NOTE — Therapy (Signed)
[place] [facility] MAIN [facility] SERVICES [address] [place], [place], [address] Phone: [phone]   Fax:  [phone]  Occupational Therapy Treatment  Patient Details  Name: Nancy Riley MRN: [id number] Date of Birth: 04-Mar-1956 Referring Provider (OT): Sheppard Plumber, NP   Encounter Date: 03/12/2021   OT End of Session - 03/12/21 1219     Visit Number 2    Number of Visits 36    Date for OT Re-Evaluation 06/07/21    OT Start Time 1115    OT Stop Time 1215    OT Time Calculation (min) 60 min    Activity Tolerance Patient tolerated treatment well;No increased pain;Patient limited by pain    Behavior During Therapy [facility] for tasks assessed/performed             Past Medical History:  Diagnosis Date   Diabetes mellitus without complication (HCC)    DJD (degenerative joint disease)    GERD (gastroesophageal reflux disease)    Hypercholesteremia    Hypertension    Morbid obesity with BMI of 50.0-59.9, adult (HCC)    Neuropathy    Phlegmon    ventral epidural phlegmon identified on lumbar spine MRI at [facility] in Nov 2020   Plantar fasciitis    Sciatica    Spondylodiscitis    lumbar spine (Nov 2020 at [facility])    Past Surgical History:  Procedure Laterality Date   BREAST EXCISIONAL BIOPSY Left 1970   negative   COLONOSCOPY      There were no vitals filed for this visit.   Subjective Assessment - 03/12/21 1224     Subjective  Nancy Riley presents for OT Rx visit 2/ 36 to address worsening BLE lymphedema. Pt arrives seated in a transport wc. As with last OT course for CDT we'll treat Pt with legs elevated on stool to avoid pain in knees and back witih transfers and frequent repositioning. Pt has no new complaints since last seen for initial eval last week.    Pertinent History Spondylodiscitis, BLE lymphedema, HTN, DM, neuropathy, DJD, arthritis, sciatica,  chronic back pain, morbid obesity, (BMI 50-59.9)    Limitations Difficulty walking,  impaired transfers/functional mobility, chronic back DJD and knee  OA pain , chronic leg swelling decreased strength, impaired balance, impaired sensation (neuropathy in feet), increased infection risk, increased fall risk,    Repetition Increases Symptoms    Special Tests + Stemmer bilaterally    Patient Stated Goals get swelling back down and replace worn compression stockings to limit LE progression    Pain Onset Other (comment)   chronic                LYMPHEDEMA/ONCOLOGY QUESTIONNAIRE - 03/12/21 1230       Right Lower Extremity Lymphedema   Other RLE A-D limb volume measures 11938.4 ml.    Other RLE A-D limb volume is INCREASED by 66.8 % since last measured on 09/20/19.                     OT Treatments/Exercises (OP) - 03/12/21 1228       ADLs   ADL Education Given Yes      Manual Therapy   Manual Therapy Edema management    Edema Management initial limb volumetrics    Compression Bandaging RLE gradient compression wrraps using  one 8 and 10 cm wide short stretch wrap, with 2 12 cm wide wraps in gradient pattern over single layer stocinett and 0.4 cm thick  rosidal foam.                    OT Education - 03/12/21 1232     Education Details Continued skilled Pt/caregiver education  And LE ADL training throughout visit for lymphedema self care/ home program, including compression wrapping, compression garment and device wear/care, lymphatic pumping ther ex, simple self-MLD, and skin care. Discussed progress towards goals.    Person(s) Educated Patient    Methods Explanation;Demonstration    Comprehension Verbalized understanding;Need further instruction;Returned demonstration                 OT Long Term Goals - 03/09/21 1208       OT LONG TERM GOAL #1   Title Pt will demonstrate understanding of lymphedema precautions and signs/ symptoms of cellulitisby identifying 6 items for each using printed Lymphedema Workbook for reference PRN  (modified independence) to limit LE progression and infection risk.    Baseline minA    Time 4    Period Days    Status New    Target Date --   4th OT Rx visit     OT LONG TERM GOAL #2   Title Pt will require Max assist from caregiver who will be able to apply multilayer, gradient compression wraps using correct techniques after skilled training to reduce limb volume and limity LE progression.    Baseline dependent    Time 4    Period Days    Status New    Target Date --   4th OT Rx visit     OT LONG TERM GOAL #3   Title Pt will achieve 10% limb volume reductions  bilaterally from ankle to tibial tuberosity during Intensive Phase CDT to return limbs to typical size and shape ,. reduce infection risk and improve basic andf instrumental ALDs performance.    Time 12    Period Weeks    Status New    Target Date 06/07/21      OT LONG TERM GOAL #4   Title Pt will consistently perform all LE self -care home protocols ( simple self MLD, skin care, therapeutic exercise, compression therapies) daily  with max CG assistance to to achieve max edema reduction, to enanble functional improvements,  including fitting preferred street shoes and standard sized clothing, and to achieve increased AROM and reduced falls risk.    Baseline Max A    Time 12    Period Weeks    Status New    Target Date 06/07/21                   Plan - 03/12/21 1220     Clinical Impression Statement Completed initial RLE comparative limb volumetrics from ankle to below the knee (A-D) . Measurements and calculation reveals Pt has a dramatic 66.8% limb volume increase of 66.8% since last measured on 09/20/2019. We'll complete LLE   volumetrics next session due to time constraints. Applied knee length , multilayer gradient compression wrapsPt educated on precautions an instructed to remove wraps in the event of acute onset of leg pain and/ or atypical SOB. Pt instructed to elevate legs when sitting, and try to keep  wraps in plce for 24 hours, if possible. Cont as per POC.    OT Occupational Profile and History Comprehensive Assessment- Review of records and extensive additional review of physical, cognitive, psychosocial history related to current functional performance    Occupational performance deficits (Please refer to evaluation for details): ADL's;Work;IADL's;Rest  and Sleep;Leisure;Social Participation;Other   role performance   Body Structure / Function / Physical Skills ADL;Skin integrity;Endurance;Balance;Mobility;Strength;Edema;Obesity;Pain;Gait;ROM;Decreased knowledge of precautions;Decreased knowledge of use of DME;IADL   chronic pain   Clinical Decision Making Multiple treatment options, significant modification of task necessary    Comorbidities Affecting Occupational Performance: Presence of comorbidities impacting occupational performance    Modification or Assistance to Complete Evaluation  Max significant modification of tasks or assist is necessary to complete    OT Frequency 2x / week    OT Duration 12 weeks   and PRN to complete CDT Intensive to BILATERAL legs   OT Treatment/Interventions Self-care/ADL training;Therapeutic exercise;Coping strategies training;Therapeutic activities;Manual lymph drainage;Energy conservation;Manual Therapy;DME and/or AE instruction;Compression bandaging;Patient/family education    Plan CDT to BLE, one leg at a time, RL:E first    OT Home Exercise Plan fit with BLE custom flat knit compression knee highs    Recommended Other Services Lymphedema management has a high burden of care, especially for Patients who have difficulty, or who are unable to reach their distal legs and feet.  reach their feet and distal legs to aapply bandages, compression garments, to bathe , inspect skin and groom nails. In this case because Pt is unable to perform these activities, daily caregiver assistance with the lymphedema self-care home program  between OT visits is essential for  achieving clinical success. Without ongoing, daily caregiver assistance during compression bandaging, donning and doffing compression garments, skin care, and MLD , Pt's prognosis is poor. However, with consistent caregiver assistance with lymphedema home program prognosis for improved lymphedema control and reducing infection risk and progression is good.    Consulted and Agree with Plan of Care Patient             Patient will benefit from skilled therapeutic intervention in order to improve the following deficits and impairments:   Body Structure / Function / Physical Skills: ADL, Skin integrity, Endurance, Balance, Mobility, Strength, Edema, Obesity, Pain, Gait, ROM, Decreased knowledge of precautions, Decreased knowledge of use of DME, IADL (chronic pain)       Visit Diagnosis: Lymphedema, not elsewhere classified    Problem List Patient Active Problem List   Diagnosis Date Noted   Osteoarthritis 10/04/2019   Spondylodiscitis 04/26/2019   Lymphedema 04/26/2019   Morbid obesity (HCC) 04/26/2019   BMI 45.0-49.9, adult (HCC) 08/20/2018   Controlled type 2 diabetes mellitus without complication, without long-term current use of insulin (HCC) 11/26/2017   GERD (gastroesophageal reflux disease) 11/26/2017   Hyperlipidemia, mixed 11/26/2017   Hypertension, essential 11/26/2017   Chest pain at rest 11/26/2017    Loel Dubonnet, Nancy, OTR/L, [facility] 03/12/21 12:34 PM   [place] [facility] MAIN [facility] SERVICES [address] [place], [place], [address] Phone: [phone]   Fax:  [phone]  Name: Nancy Riley MRN: [id number] Date of Birth: [birth year]

## 2021-03-15 ENCOUNTER — Other Ambulatory Visit: Payer: Self-pay

## 2021-03-15 ENCOUNTER — Ambulatory Visit: Payer: Medicare PPO | Admitting: Occupational Therapy

## 2021-03-15 DIAGNOSIS — I890 Lymphedema, not elsewhere classified: Secondary | ICD-10-CM

## 2021-03-15 NOTE — Patient Instructions (Signed)

## 2021-03-15 NOTE — Therapy (Signed)
[facility] MAIN [facility] SERVICES [address] [place], [place], [address] Phone: [phone]   Fax:  [phone]  Occupational Therapy Treatment  Patient Details  Name: Nancy Riley MRN: [id number] Date of Birth: [birth year] Referring Provider (OT): Sheppard Plumber, NP   Encounter Date: 03/15/2021   OT End of Session - 03/15/21 1111     Visit Number 3    Number of Visits 36    Date for OT Re-Evaluation 06/07/21    OT Start Time 1105    OT Stop Time 1205    OT Time Calculation (min) 60 min    Activity Tolerance Patient tolerated treatment well;No increased pain;Patient limited by pain    Behavior During Therapy [facility] for tasks assessed/performed             Past Medical History:  Diagnosis Date   Diabetes mellitus without complication (HCC)    DJD (degenerative joint disease)    GERD (gastroesophageal reflux disease)    Hypercholesteremia    Hypertension    Morbid obesity with BMI of 50.0-59.9, adult (HCC)    Neuropathy    Phlegmon    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   Subjective Assessment - 03/15/21 1114     Subjective  Nancy Riley presents for OT Rx visit 3/ 36 to address worsening BLE lymphedema. Pt arrives seated in a transport wc. Pt presents with RLE knee length compression wraps in place. Pt reports 8/10 knee pain tis morning.    Pertinent History Spondylodiscitis, BLE lymphedema, HTN, DM, neuropathy, DJD, arthritis, sciatica,  chronic back pain, morbid obesity, (BMI 50-59.9)    Limitations Difficulty walking, impaired transfers/functional mobility, chronic back DJD and knee  OA pain , chronic leg swelling decreased strength,  impaired balance, impaired sensation (neuropathy in feet), increased infection risk, increased fall risk,    Repetition Increases Symptoms    Special Tests + Stemmer bilaterally    Patient Stated Goals get swelling back down and replace worn compression stockings to limit LE progression    Pain Onset Other (comment)   chronic                LYMPHEDEMA/ONCOLOGY QUESTIONNAIRE - 03/15/21 1218       Right Lower Extremity Lymphedema   Other CORRECTION FOR  03/12/21: RLE A-D limb volume measures 9886.0 ml.    Other CORRECTION FOR  03/12/21: RLE A-D limb volume is INCREASED by 38.13 % since last measured on 09/20/19.      Left Lower Extremity Lymphedema   Other LLE A-D       volume = 10309.0 ml.    Other LLE A-D limb volume is increased by 21% since last measured on 08/23/19.                     OT Treatments/Exercises (OP) - 03/15/21 1218       ADLs   ADL Education Given Yes      Manual Therapy   Manual Therapy Edema management    Edema Management initial limb volumetrics for LLE    Compression Bandaging RLE gradient compression wrraps  using  one 8 and 10 cm wide short stretch wrap, with 2 12 cm wide wraps in gradient pattern over single layer stocinett and 0.4 cm thick rosidal foam.                    OT Education - 03/15/21 1228     Education Details Continued skilled Pt/caregiver education  And LE ADL training throughout visit for lymphedema self care/ home program, including compression wrapping, compression garment and device wear/care, lymphatic pumping ther ex, simple self-MLD, and skin care. Discussed progress towards goals.    Person(s) Educated Patient    Methods Explanation;Demonstration    Comprehension Verbalized understanding;Need further instruction;Returned demonstration                 OT Long Term Goals - 03/09/21 1208       OT LONG TERM GOAL #1   Title Pt will demonstrate understanding of lymphedema precautions and signs/  symptoms of cellulitisby identifying 6 items for each using printed Lymphedema Workbook for reference PRN (modified independence) to limit LE progression and infection risk.    Baseline minA    Time 4    Period Days    Status New    Target Date --   4th OT Rx visit     OT LONG TERM GOAL #2   Title Pt will require Max assist from caregiver who will be able to apply multilayer, gradient compression wraps using correct techniques after skilled training to reduce limb volume and limity LE progression.    Baseline dependent    Time 4    Period Days    Status New    Target Date --   4th OT Rx visit     OT LONG TERM GOAL #3   Title Pt will achieve 10% limb volume reductions  bilaterally from ankle to tibial tuberosity during Intensive Phase CDT to return limbs to typical size and shape ,. reduce infection risk and improve basic andf instrumental ALDs performance.    Time 12    Period Weeks    Status New    Target Date 06/07/21      OT LONG TERM GOAL #4   Title Pt will consistently perform all LE self -care home protocols ( simple self MLD, skin care, therapeutic exercise, compression therapies) daily  with max CG assistance to to achieve max edema reduction, to enanble functional improvements,  including fitting preferred street shoes and standard sized clothing, and to achieve increased AROM and reduced falls risk.    Baseline Max A    Time 12    Period Weeks    Status New    Target Date 06/07/21                   Plan - 03/15/21 1224     Clinical Impression Statement Correction for initial RLE : Limb volume for leg (ankle to tibial tuberosity, A-D) measures 9886.0 ml, revealing a 38.13% increase vs a 66% increase since last measured 4/21. LLE volumes measured today reveal a 21% increase since last measured on 08/23/19. RLE limb volume has clearly reduced since last visit. Pt has been wrapped for 2 days as her daughter has not been available to assist with re-wrapping. Skin  bilaterally is well hydrated. Pt tolerated all Rx today without increased pain. Cont as per POC. Commence RLE MLD next session.    OT Occupational Profile and History Comprehensive Assessment- Review of records and extensive additional review of  physical, cognitive, psychosocial history related to current functional performance    Occupational performance deficits (Please refer to evaluation for details): ADL's;Work;IADL's;Rest and Sleep;Leisure;Social Participation;Other   role performance   Body Structure / Function / Physical Skills ADL;Skin integrity;Endurance;Balance;Mobility;Strength;Edema;Obesity;Pain;Gait;ROM;Decreased knowledge of precautions;Decreased knowledge of use of DME;IADL   chronic pain   Clinical Decision Making Multiple treatment options, significant modification of task necessary    Comorbidities Affecting Occupational Performance: Presence of comorbidities impacting occupational performance    Modification or Assistance to Complete Evaluation  Max significant modification of tasks or assist is necessary to complete    OT Frequency 2x / week    OT Duration 12 weeks   and PRN to complete CDT Intensive to BILATERAL legs   OT Treatment/Interventions Self-care/ADL training;Therapeutic exercise;Coping strategies training;Therapeutic activities;Manual lymph drainage;Energy conservation;Manual Therapy;DME and/or AE instruction;Compression bandaging;Patient/family education    Plan CDT to BLE, one leg at a time, RL:E first    OT Home Exercise Plan fit with BLE custom flat knit compression knee highs    Recommended Other Services Lymphedema management has a high burden of care, especially for Patients who have difficulty, or who are unable to reach their distal legs and feet.  reach their feet and distal legs to aapply bandages, compression garments, to bathe , inspect skin and groom nails. In this case because Pt is unable to perform these activities, daily caregiver assistance with the  lymphedema self-care home program  between OT visits is essential for achieving clinical success. Without ongoing, daily caregiver assistance during compression bandaging, donning and doffing compression garments, skin care, and MLD , Pt's prognosis is poor. However, with consistent caregiver assistance with lymphedema home program prognosis for improved lymphedema control and reducing infection risk and progression is good.    Consulted and Agree with Plan of Care Patient             Patient will benefit from skilled therapeutic intervention in order to improve the following deficits and impairments:   Body Structure / Function / Physical Skills: ADL, Skin integrity, Endurance, Balance, Mobility, Strength, Edema, Obesity, Pain, Gait, ROM, Decreased knowledge of precautions, Decreased knowledge of use of DME, IADL (chronic pain)       Visit Diagnosis: Lymphedema, not elsewhere classified    Problem List Patient Active Problem List   Diagnosis Date Noted   Osteoarthritis 10/04/2019   Spondylodiscitis 04/26/2019   Lymphedema 04/26/2019   Morbid obesity (HCC) 04/26/2019   BMI 45.0-49.9, adult (HCC) 08/20/2018   Controlled type 2 diabetes mellitus without complication, without long-term current use of insulin (HCC) 11/26/2017   GERD (gastroesophageal reflux disease) 11/26/2017   Hyperlipidemia, mixed 11/26/2017   Hypertension, essential 11/26/2017   Chest pain at rest 11/26/2017     Loel Dubonnet, Nancy, OTR/L, [facility] 03/15/21 12:29 PM    [facility] MAIN [facility] SERVICES [address] [place], [place], [address] Phone: [phone]   Fax:  [phone]  Name: KAHLANI GRABER MRN: [id number] Date of Birth: 09/21/1955

## 2021-03-19 ENCOUNTER — Other Ambulatory Visit: Payer: Self-pay

## 2021-03-19 ENCOUNTER — Ambulatory Visit: Payer: Medicare PPO | Admitting: Occupational Therapy

## 2021-03-19 DIAGNOSIS — I890 Lymphedema, not elsewhere classified: Secondary | ICD-10-CM | POA: Diagnosis not present

## 2021-03-19 NOTE — Patient Instructions (Signed)

## 2021-03-19 NOTE — Therapy (Signed)
[facility] MAIN [facility] SERVICES [address] [place], [place], [address] Phone: [phone]   Fax:  [phone]  Occupational Therapy Treatment  Patient Details  Name: Nancy Riley MRN: [id number] Date of Birth: 09-16-1955 Referring Provider (OT): Sheppard Plumber, NP   Encounter Date: 03/19/2021   OT End of Session - 03/19/21 1109     Visit Number 4    Number of Visits 36    Date for OT Re-Evaluation 06/07/21    OT Start Time 1100    OT Stop Time 1205    OT Time Calculation (min) 65 min    Activity Tolerance Patient tolerated treatment well;No increased pain;Patient limited by pain    Behavior During Therapy [facility] for tasks assessed/performed             Past Medical History:  Diagnosis Date   Diabetes mellitus without complication (HCC)    DJD (degenerative joint disease)    GERD (gastroesophageal reflux disease)    Hypercholesteremia    Hypertension    Morbid obesity with BMI of 50.0-59.9, adult (HCC)    Neuropathy    Phlegmon    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   Subjective Assessment - 03/19/21 1115     Subjective  Nancy Riley presents for OT Rx visit 4/ 36 to address worsening BLE lymphedema. Pt arrives seated in a transport wc. Pt presents with RLE knee length compression wraps in place. Pt reports 5/10 knee pain tis morning. Pt tells me compression wraps have remained in place on er R leg since applied 4 days ago  at last OT visit. Pt did not remove to bathe or inspect skin, or to re-wrap with assistance as instructed.    Pertinent History Spondylodiscitis, BLE lymphedema, HTN, DM, neuropathy, DJD, arthritis, sciatica,  chronic  back pain, morbid obesity, (BMI 50-59.9)    Limitations Difficulty walking, impaired transfers/functional mobility, chronic back DJD and knee  OA pain , chronic leg swelling decreased strength, impaired balance, impaired sensation (neuropathy in feet), increased infection risk, increased fall risk,    Repetition Increases Symptoms    Special Tests + Stemmer bilaterally    Patient Stated Goals get swelling back down and replace worn compression stockings to limit LE progression    Pain Onset Other (comment)   chronic                         OT Treatments/Exercises (OP) - 03/19/21 1341       ADLs   ADL Education Given Yes      Manual Therapy   Manual Therapy Edema management    Manual Lymphatic Drainage (MLD) MLD to RLE/RLQ with simultaneous skin care to increase skin flexibility and to increase hydration. Utilized modified sjort neck sequence  omitting lateral neck strokes and performing J strokes to clavicular regions only. Pt instructed in diaphragmatic breathing during MLD. Utilized functional inguinal LN and deep adominal pathways working from proximal to distal through functional inguinal LN/. No c/o pain    throughout Rx.    Compression Bandaging RLE gradient compression wrraps using  one 8 and 10 cm wide short stretch wrap, with 2 12 cm wide wraps in gradient pattern over single layer stocinett and 0.4 cm thick rosidal foam.                    OT Education - 03/19/21 1344     Education Details Continued skilled Pt/caregiver education  And LE ADL training throughout visit for lymphedema self care/ home program, including compression wrapping, compression garment and device wear/care, lymphatic pumping ther ex, simple self-MLD, and skin care. Pt specificaly reminded not to leave gradient compression wraps applied in clinic in place for more than 48 hours, at the most, to limit risk of injury to skin and/ or infection risk.    Person(s) Educated Patient     Methods Explanation;Demonstration    Comprehension Verbalized understanding;Need further instruction;Returned demonstration                 OT Long Term Goals - 03/09/21 1208       OT LONG TERM GOAL #1   Title Pt will demonstrate understanding of lymphedema precautions and signs/ symptoms of cellulitisby identifying 6 items for each using printed Lymphedema Workbook for reference PRN (modified independence) to limit LE progression and infection risk.    Baseline minA    Time 4    Period Days    Status New    Target Date --   4th OT Rx visit     OT LONG TERM GOAL #2   Title Pt will require Max assist from caregiver who will be able to apply multilayer, gradient compression wraps using correct techniques after skilled training to reduce limb volume and limity LE progression.    Baseline dependent    Time 4    Period Days    Status New    Target Date --   4th OT Rx visit     OT LONG TERM GOAL #3   Title Pt will achieve 10% limb volume reductions  bilaterally from ankle to tibial tuberosity during Intensive Phase CDT to return limbs to typical size and shape ,. reduce infection risk and improve basic andf instrumental ALDs performance.    Time 12    Period Weeks    Status New    Target Date 06/07/21      OT LONG TERM GOAL #4   Title Pt will consistently perform all LE self -care home protocols ( simple self MLD, skin care, therapeutic exercise, compression therapies) daily  with max CG assistance to to achieve max edema reduction, to enanble functional improvements,  including fitting preferred street shoes and standard sized clothing, and to achieve increased AROM and reduced falls risk.    Baseline Max A    Time 12    Period Weeks    Status New    Target Date 06/07/21                    Patient will benefit from skilled therapeutic intervention in order to improve the following deficits and impairments:           Visit Diagnosis: Lymphedema, not  elsewhere classified    Problem List Patient Active Problem List   Diagnosis Date Noted   Osteoarthritis 10/04/2019   Spondylodiscitis 04/26/2019   Lymphedema 04/26/2019   Morbid obesity (HCC) 04/26/2019   BMI 45.0-49.9, adult (HCC) 08/20/2018   Controlled type 2 diabetes mellitus without complication, without long-term current use of insulin (HCC) 11/26/2017   GERD (gastroesophageal  reflux disease) 11/26/2017   Hyperlipidemia, mixed 11/26/2017   Hypertension, essential 11/26/2017   Chest pain at rest 11/26/2017   Loel Dubonnet, Nancy, OTR/L, [facility] 03/19/21 1:47 PM   [place] [facility] MAIN [place] SERVICES [address] [place], [place], [address] Phone: [phone]   Fax:  [phone]  Name: Nancy Riley MRN: [id number] Date of Birth: 05-05-1956

## 2021-03-22 ENCOUNTER — Other Ambulatory Visit: Payer: Self-pay

## 2021-03-22 ENCOUNTER — Ambulatory Visit: Payer: Medicare PPO | Admitting: Occupational Therapy

## 2021-03-22 DIAGNOSIS — I890 Lymphedema, not elsewhere classified: Secondary | ICD-10-CM | POA: Diagnosis not present

## 2021-03-22 NOTE — Patient Instructions (Signed)

## 2021-03-22 NOTE — Therapy (Signed)
[facility] MAIN [facility] SERVICES [address] [place], [place], [address] Phone: [phone]   Fax:  [phone]  Occupational Therapy Treatment  Patient Details  Name: Nancy Riley MRN: [id number] Date of Birth: 1955-10-31 Referring Provider (OT): Sheppard Plumber, NP   Encounter Date: 03/22/2021   OT End of Session - 03/22/21 1111     Visit Number 5    Number of Visits 36    Date for OT Re-Evaluation 06/07/21    OT Start Time 1105    OT Stop Time 1205    OT Time Calculation (min) 60 min    Activity Tolerance Patient tolerated treatment well;No increased pain;Patient limited by pain    Behavior During Therapy [facility] for tasks assessed/performed             Past Medical History:  Diagnosis Date   Diabetes mellitus without complication (HCC)    DJD (degenerative joint disease)    GERD (gastroesophageal reflux disease)    Hypercholesteremia    Hypertension    Morbid obesity with BMI of 50.0-59.9, adult (HCC)    Neuropathy    Phlegmon    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   Subjective Assessment - 03/22/21 1112     Subjective  [name] presents for OT Rx visit 5/ 36 to address worsening BLE lymphedema. Pt arrives seated in a transport wc. Pt presents with RLE knee length compression wraps in place. Pt reports 4/10 knee pain tis morning. Pt tells me she has " little help" with changing wraps between sessions.    Pertinent History Spondylodiscitis, BLE lymphedema, HTN, DM, neuropathy, DJD, arthritis, sciatica,  chronic back pain, morbid obesity, (BMI 50-59.9)    Limitations Difficulty walking, impaired transfers/functional mobility, chronic  back DJD and knee  OA pain , chronic leg swelling decreased strength, impaired balance, impaired sensation (neuropathy in feet), increased infection risk, increased fall risk,    Repetition Increases Symptoms    Special Tests + Stemmer bilaterally    Patient Stated Goals get swelling back down and replace worn compression stockings to limit LE progression    Pain Onset Other (comment)   chronic                         OT Treatments/Exercises (OP) - 03/22/21 1336       ADLs   ADL Education Given Yes      Manual Therapy   Manual Therapy Edema management    Manual therapy comments skin care throughout MLD to increase hydration and flexibility    Manual Lymphatic Drainage (MLD) MLD to RLE/RLQ with simultaneous skin care to increase skin flexibility and to increase hydration. Utilized modified sjort neck sequence  omitting lateral neck strokes and performing J strokes to clavicular regions only. Pt instructed in diaphragmatic breathing during MLD. Utilized functional inguinal LN and deep adominal pathways working from proximal to distal through functional inguinal LN/. No c/o pain    throughout Rx.    Compression Bandaging RLE gradient compression wrraps using  one 8 and 10 cm wide short stretch wrap, with 2  12 cm wide wraps in gradient pattern over single layer stocinett and 0.4 cm thick rosidal foam.                    OT Education - 03/22/21 1116     Education Details Continued skilled Pt/caregiver education  And LE ADL training throughout visit for lymphedema self care/ home program, including compression wrapping, compression garment and device wear/care, lymphatic pumping ther ex, simple self-MLD, and skin care. Pt specificaly reminded not to leave gradient compression wraps applied in clinic in place for more than 48 hours, at the most, to limit risk of injury to skin and/ or infection risk.    Person(s) Educated Patient    Methods Explanation;Demonstration     Comprehension Verbalized understanding;Need further instruction;Returned demonstration                 OT Long Term Goals - 03/09/21 1208       OT LONG TERM GOAL #1   Title Pt will demonstrate understanding of lymphedema precautions and signs/ symptoms of cellulitisby identifying 6 items for each using printed Lymphedema Workbook for reference PRN (modified independence) to limit LE progression and infection risk.    Baseline minA    Time 4    Period Days    Status New    Target Date --   4th OT Rx visit     OT LONG TERM GOAL #2   Title Pt will require Max assist from caregiver who will be able to apply multilayer, gradient compression wraps using correct techniques after skilled training to reduce limb volume and limity LE progression.    Baseline dependent    Time 4    Period Days    Status New    Target Date --   4th OT Rx visit     OT LONG TERM GOAL #3   Title Pt will achieve 10% limb volume reductions  bilaterally from ankle to tibial tuberosity during Intensive Phase CDT to return limbs to typical size and shape ,. reduce infection risk and improve basic andf instrumental ALDs performance.    Time 12    Period Weeks    Status New    Target Date 06/07/21      OT LONG TERM GOAL #4   Title Pt will consistently perform all LE self -care home protocols ( simple self MLD, skin care, therapeutic exercise, compression therapies) daily  with max CG assistance to to achieve max edema reduction, to enanble functional improvements,  including fitting preferred street shoes and standard sized clothing, and to achieve increased AROM and reduced falls risk.    Baseline Max A    Time 12    Period Weeks    Status New    Target Date 06/07/21                   Plan - 03/22/21 1338     Clinical Impression Statement Continued RLE skin care. MLD and gradient compression wrapping from base of toes to tibial tuberosity , as established. Limb volume ontinues to decrease my  visual assessment and palpation. Pt denies leg pain below the very painful, arthritic knees. Continued with Pt edu today for importance of changing compression wraps between visits to inspect skin, bathe , and moisturized skin. Pt is unable to perform self-MLD. Pt tells me her daughter re-wrapped her R leg during visit interval, but upon inspection when removing wraps for treatment, this looks like wraps applied last visit  based on patterns   used  instead of typical circumferential pattern. Cont as per POC. Cont to support Pt while urging her to secure caregiver / family assistance with wraps and skin care between visits. Also discussed plan for compression garment fitting and estimated out of pocket cost so Pt can be preparing. Cont as per POC.    OT Occupational Profile and History Comprehensive Assessment- Review of records and extensive additional review of physical, cognitive, psychosocial history related to current functional performance    Occupational performance deficits (Please refer to evaluation for details): ADL's;Work;IADL's;Rest and Sleep;Leisure;Social Participation;Other   role performance   Body Structure / Function / Physical Skills ADL;Skin integrity;Endurance;Balance;Mobility;Strength;Edema;Obesity;Pain;Gait;ROM;Decreased knowledge of precautions;Decreased knowledge of use of DME;IADL   chronic pain   Clinical Decision Making Multiple treatment options, significant modification of task necessary    Comorbidities Affecting Occupational Performance: Presence of comorbidities impacting occupational performance    Modification or Assistance to Complete Evaluation  Max significant modification of tasks or assist is necessary to complete    OT Frequency 2x / week    OT Duration 12 weeks   and PRN to complete CDT Intensive to BILATERAL legs   OT Treatment/Interventions Self-care/ADL training;Therapeutic exercise;Coping strategies training;Therapeutic activities;Manual lymph drainage;Energy  conservation;Manual Therapy;DME and/or AE instruction;Compression bandaging;Patient/family education    Plan CDT to BLE, one leg at a time, RL:E first    OT Home Exercise Plan fit with BLE custom flat knit compression knee highs    Recommended Other Services Lymphedema management has a high burden of care, especially for Patients who have difficulty, or who are unable to reach their distal legs and feet.  reach their feet and distal legs to aapply bandages, compression garments, to bathe , inspect skin and groom nails. In this case because Pt is unable to perform these activities, daily caregiver assistance with the lymphedema self-care home program  between OT visits is essential for achieving clinical success. Without ongoing, daily caregiver assistance during compression bandaging, donning and doffing compression garments, skin care, and MLD , Pt's prognosis is poor. However, with consistent caregiver assistance with lymphedema home program prognosis for improved lymphedema control and reducing infection risk and progression is good.    Consulted and Agree with Plan of Care Patient             Patient will benefit from skilled therapeutic intervention in order to improve the following deficits and impairments:   Body Structure / Function / Physical Skills: ADL, Skin integrity, Endurance, Balance, Mobility, Strength, Edema, Obesity, Pain, Gait, ROM, Decreased knowledge of precautions, Decreased knowledge of use of DME, IADL (chronic pain)       Visit Diagnosis: Lymphedema, not elsewhere classified    Problem List Patient Active Problem List   Diagnosis Date Noted   Osteoarthritis 10/04/2019   Spondylodiscitis 04/26/2019   Lymphedema 04/26/2019   Morbid obesity (HCC) 04/26/2019   BMI 45.0-49.9, adult (HCC) 08/20/2018   Controlled type 2 diabetes mellitus without complication, without long-term current use of insulin (HCC) 11/26/2017   GERD (gastroesophageal reflux disease) 11/26/2017    Hyperlipidemia, mixed 11/26/2017   Hypertension, essential 11/26/2017   Chest pain at rest 11/26/2017   Loel Dubonnet, MS, OTR/L, [facility] 03/22/21 1:45 PM   [facility] MAIN [facility] SERVICES [address] [place], [place], [address] Phone: [phone]   Fax:  [phone]  Name: ALIVIYA SCHOELLER MRN: [id number] Date of Birth: [birth year]

## 2021-03-27 ENCOUNTER — Ambulatory Visit: Payer: Medicare PPO | Admitting: Occupational Therapy

## 2021-03-27 ENCOUNTER — Other Ambulatory Visit: Payer: Self-pay

## 2021-03-27 DIAGNOSIS — I890 Lymphedema, not elsewhere classified: Secondary | ICD-10-CM

## 2021-03-27 NOTE — Patient Instructions (Signed)

## 2021-03-27 NOTE — Therapy (Signed)
[facility] MAIN [facility] SERVICES [address] [place], [place], [address] Phone: [phone]   Fax:  [phone]  Occupational Therapy Treatment  Patient Details  Name: Nancy Riley MRN: [id number] Date of Birth: 31-Jul-1955 Referring Provider (OT): Sheppard Plumber, NP   Encounter Date: 03/27/2021   OT End of Session - 03/27/21 1110     Visit Number 6    Number of Visits 36    Date for OT Re-Evaluation 06/07/21    OT Start Time 1105    OT Stop Time 1210    OT Time Calculation (min) 65 min    Activity Tolerance Patient tolerated treatment well;No increased pain;Patient limited by pain    Behavior During Therapy [facility] for tasks assessed/performed             Past Medical History:  Diagnosis Date   Diabetes mellitus without complication (HCC)    DJD (degenerative joint disease)    GERD (gastroesophageal reflux disease)    Hypercholesteremia    Hypertension    Morbid obesity with BMI of 50.0-59.9, adult (HCC)    Neuropathy    Phlegmon    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   Subjective Assessment - 03/27/21 1111     Subjective  Ms Ragsdale presents for OT Rx visit 5/ 36 to address worsening BLE lymphedema. Pt arrives seated in a transport wc. Pt presents with RLE knee length compression wraps in place. Pt reports 4/10 knee pain tis morning. Pt tells me she has " little help" with changing wraps between sessions.`    Pertinent History Spondylodiscitis, BLE lymphedema, HTN, DM, neuropathy, DJD, arthritis, sciatica,  chronic back pain, morbid obesity, (BMI 50-59.9)    Limitations Difficulty walking, impaired transfers/functional mobility,  chronic back DJD and knee  OA pain , chronic leg swelling decreased strength, impaired balance, impaired sensation (neuropathy in feet), increased infection risk, increased fall risk,    Repetition Increases Symptoms    Special Tests + Stemmer bilaterally    Patient Stated Goals get swelling back down and replace worn compression stockings to limit LE progression    Pain Onset Other (comment)   chronic                         OT Treatments/Exercises (OP) - 03/27/21 1258       ADLs   ADL Education Given Yes      Manual Therapy   Manual Therapy Edema management    Manual therapy comments skin care throughout MLD to increase hydration and flexibility    Manual Lymphatic Drainage (MLD) MLD to RLE/RLQ with simultaneous skin care to increase skin flexibility and to increase hydration. Utilized modified sjort neck sequence  omitting lateral neck strokes and performing J strokes to clavicular regions only. Pt instructed in diaphragmatic breathing during MLD. Utilized functional inguinal LN and deep adominal pathways working from proximal to distal through functional inguinal LN/. No c/o pain    throughout Rx.    Compression Bandaging RLE gradient compression wrraps using  one 8 and 10 cm wide short stretch wrap, with 2  12 cm wide wraps in gradient pattern over single layer stocinett and 0.4 cm thick rosidal foam.                    OT Education - 03/27/21 1259     Education Details Continued skilled Pt/caregiver education  And LE ADL training throughout visit for lymphedema self care/ home program, including compression wrapping, compression garment and device wear/care, lymphatic pumping ther ex, simple self-MLD, and skin care. Pt specificaly reminded not to leave gradient compression wraps applied in clinic in place for more than 48 hours, at the most, to limit risk of injury to skin and/ or infection risk.    Person(s) Educated Patient    Methods  Explanation;Demonstration    Comprehension Verbalized understanding;Need further instruction;Returned demonstration                 OT Long Term Goals - 03/09/21 1208       OT LONG TERM GOAL #1   Title Pt will demonstrate understanding of lymphedema precautions and signs/ symptoms of cellulitisby identifying 6 items for each using printed Lymphedema Workbook for reference PRN (modified independence) to limit LE progression and infection risk.    Baseline minA    Time 4    Period Days    Status New    Target Date --   4th OT Rx visit     OT LONG TERM GOAL #2   Title Pt will require Max assist from caregiver who will be able to apply multilayer, gradient compression wraps using correct techniques after skilled training to reduce limb volume and limity LE progression.    Baseline dependent    Time 4    Period Days    Status New    Target Date --   4th OT Rx visit     OT LONG TERM GOAL #3   Title Pt will achieve 10% limb volume reductions  bilaterally from ankle to tibial tuberosity during Intensive Phase CDT to return limbs to typical size and shape ,. reduce infection risk and improve basic andf instrumental ALDs performance.    Time 12    Period Weeks    Status New    Target Date 06/07/21      OT LONG TERM GOAL #4   Title Pt will consistently perform all LE self -care home protocols ( simple self MLD, skin care, therapeutic exercise, compression therapies) daily  with max CG assistance to to achieve max edema reduction, to enanble functional improvements,  including fitting preferred street shoes and standard sized clothing, and to achieve increased AROM and reduced falls risk.    Baseline Max A    Time 12    Period Weeks    Status New    Target Date 06/07/21                   Plan - 03/27/21 1254     Clinical Impression Statement Pt's pain on R heel   resolved when shoes doffed and compression wraps removed. Daughter assisted Pt with wraps during visit  interval, but they were not applied correctly and most likely exacerbated heel pain beneath large,   hardened callous on the bottom of the R heel. Demonstrated multi layer compression wrapping in detail and Pt made cell phone video for her doctor while OT wrapped R leg after manual therapy. Tried on existing stocking Pt brought to clinic today, but unfortunately this nearly new garment does not fit Pt after significant  weight gail between OT courses. We rewrapped R leg after MLD, which Pt toolerated without increased pain. Cont as per POC.    OT Occupational Profile and History Comprehensive Assessment- Review of records and extensive additional review of physical, cognitive, psychosocial history related to current functional performance    Occupational performance deficits (Please refer to evaluation for details): ADL's;Work;IADL's;Rest and Sleep;Leisure;Social Participation;Other   role performance   Body Structure / Function / Physical Skills ADL;Skin integrity;Endurance;Balance;Mobility;Strength;Edema;Obesity;Pain;Gait;ROM;Decreased knowledge of precautions;Decreased knowledge of use of DME;IADL   chronic pain   Clinical Decision Making Multiple treatment options, significant modification of task necessary    Comorbidities Affecting Occupational Performance: Presence of comorbidities impacting occupational performance    Modification or Assistance to Complete Evaluation  Max significant modification of tasks or assist is necessary to complete    OT Frequency 2x / week    OT Duration 12 weeks   and PRN to complete CDT Intensive to BILATERAL legs   OT Treatment/Interventions Self-care/ADL training;Therapeutic exercise;Coping strategies training;Therapeutic activities;Manual lymph drainage;Energy conservation;Manual Therapy;DME and/or AE instruction;Compression bandaging;Patient/family education    Plan CDT to BLE, one leg at a time, RL:E first    OT Home Exercise Plan fit with BLE custom flat knit  compression knee highs    Recommended Other Services Lymphedema management has a high burden of care, especially for Patients who have difficulty, or who are unable to reach their distal legs and feet.  reach their feet and distal legs to aapply bandages, compression garments, to bathe , inspect skin and groom nails. In this case because Pt is unable to perform these activities, daily caregiver assistance with the lymphedema self-care home program  between OT visits is essential for achieving clinical success. Without ongoing, daily caregiver assistance during compression bandaging, donning and doffing compression garments, skin care, and MLD , Pt's prognosis is poor. However, with consistent caregiver assistance with lymphedema home program prognosis for improved lymphedema control and reducing infection risk and progression is good.    Consulted and Agree with Plan of Care Patient             Patient will benefit from skilled therapeutic intervention in order to improve the following deficits and impairments:   Body Structure / Function / Physical Skills: ADL, Skin integrity, Endurance, Balance, Mobility, Strength, Edema, Obesity, Pain, Gait, ROM, Decreased knowledge of precautions, Decreased knowledge of use of DME, IADL (chronic pain)       Visit Diagnosis: Lymphedema, not elsewhere classified    Problem List Patient Active Problem List   Diagnosis Date Noted   Osteoarthritis 10/04/2019   Spondylodiscitis 04/26/2019   Lymphedema 04/26/2019   Morbid obesity (HCC) 04/26/2019   BMI 45.0-49.9, adult (HCC) 08/20/2018   Controlled type 2 diabetes mellitus without complication, without long-term current use of insulin (HCC) 11/26/2017   GERD (gastroesophageal reflux disease) 11/26/2017   Hyperlipidemia, mixed 11/26/2017   Hypertension, essential 11/26/2017   Chest pain at rest 11/26/2017    Loel Dubonnet, MS, OTR/L, CLT-LANA 03/27/21 1:00 PM  [place] [facility] MAIN [facility] SERVICES [address] [place], [place], [address] Phone: [phone]   Fax:  [phone]  Name: Nancy Riley MRN: [id number] Date of Birth: [birth year]

## 2021-03-29 ENCOUNTER — Ambulatory Visit: Payer: Medicare PPO | Admitting: Occupational Therapy

## 2021-03-29 ENCOUNTER — Other Ambulatory Visit: Payer: Self-pay

## 2021-03-29 DIAGNOSIS — I890 Lymphedema, not elsewhere classified: Secondary | ICD-10-CM | POA: Diagnosis not present

## 2021-03-29 NOTE — Patient Instructions (Signed)

## 2021-03-29 NOTE — Therapy (Signed)
[place] [facility] MAIN [facility] SERVICES [address] [place], [place], [address] Phone: [phone]   Fax:  [phone]  Occupational Therapy Treatment  Patient Details  Name: Nancy Riley MRN: [id number] Date of Birth: 06-Sep-1955 Referring Provider (OT): Sheppard Plumber, NP   Encounter Date: 03/29/2021   OT End of Session - 03/29/21 1117     Visit Number 7    Number of Visits 36    Date for OT Re-Evaluation 06/07/21    OT Start Time 1105    OT Stop Time 1210    OT Time Calculation (min) 65 min    Activity Tolerance Patient tolerated treatment well;No increased pain;Patient limited by pain    Behavior During Therapy [facility] for tasks assessed/performed             Past Medical History:  Diagnosis Date   Diabetes mellitus without complication (HCC)    DJD (degenerative joint disease)    GERD (gastroesophageal reflux disease)    Hypercholesteremia    Hypertension    Morbid obesity with BMI of 50.0-59.9, adult (HCC)    Neuropathy    Phlegmon    ventral epidural phlegmon identified on lumbar spine MRI at [facility] in Nov 2020   Plantar fasciitis    Sciatica    Spondylodiscitis    lumbar spine (Nov 2020 at [facility])    Past Surgical History:  Procedure Laterality Date   BREAST EXCISIONAL BIOPSY Left 1970   negative   COLONOSCOPY      There were no vitals filed for this visit.   Subjective Assessment - 03/29/21 1442     Subjective  Nancy Riley presents for OT Rx visit 6/ 36 to address worsening BLE lymphedema. Pt arrives seated in a transport wc. Pt presents with RLE knee length compression wraps in place. Pt states she declines to rate pain today as it's almost her birthdayand she doesn't want to give into pain today.    Pertinent History Spondylodiscitis, BLE lymphedema, HTN, DM, neuropathy, DJD, arthritis, sciatica,  chronic back pain, morbid obesity, (BMI 50-59.9)    Limitations Difficulty walking, impaired transfers/functional mobility,  chronic back DJD and knee  OA pain , chronic leg swelling decreased strength, impaired balance, impaired sensation (neuropathy in feet), increased infection risk, increased fall risk,    Repetition Increases Symptoms    Special Tests + Stemmer bilaterally    Patient Stated Goals get swelling back down and replace worn compression stockings to limit LE progression    Pain Onset Other (comment)   chronic                         OT Treatments/Exercises (OP) - 03/29/21 1443       ADLs   ADL Education Given Yes      Manual Therapy   Manual Therapy Edema management    Manual therapy comments skin care throughout MLD to increase hydration and flexibility    Manual Lymphatic Drainage (MLD) MLD to RLE/RLQ with simultaneous skin care to increase skin flexibility and to increase hydration. Utilized modified sjort neck sequence  omitting lateral neck strokes and performing J strokes to clavicular regions only. Pt instructed in diaphragmatic breathing during MLD. Utilized functional inguinal LN and deep adominal pathways working from proximal to distal through functional inguinal LN/. No c/o pain    throughout Rx.    Compression Bandaging RLE gradient compression wrraps using  one 8 and 10 cm wide short stretch wrap, with  2 12 cm wide wraps in gradient pattern over single layer stocinett and 0.4 cm thick rosidal foam.                    OT Education - 03/29/21 1444     Education Details Continued skilled Pt/caregiver education  And LE ADL training throughout visit for lymphedema self care/ home program, including compression wrapping, compression garment and device wear/care, lymphatic pumping ther ex, simple self-MLD, and skin care. Pt specificaly reminded not to leave gradient compression wraps applied in clinic in place for more than 48 hours, at the most, to limit risk of injury to skin and/ or infection risk.    Person(s) Educated Patient    Methods  Explanation;Demonstration    Comprehension Verbalized understanding;Need further instruction;Returned demonstration                 OT Long Term Goals - 03/09/21 1208       OT LONG TERM GOAL #1   Title Pt will demonstrate understanding of lymphedema precautions and signs/ symptoms of cellulitisby identifying 6 items for each using printed Lymphedema Workbook for reference PRN (modified independence) to limit LE progression and infection risk.    Baseline minA    Time 4    Period Days    Status New    Target Date --   4th OT Rx visit     OT LONG TERM GOAL #2   Title Pt will require Max assist from caregiver who will be able to apply multilayer, gradient compression wraps using correct techniques after skilled training to reduce limb volume and limity LE progression.    Baseline dependent    Time 4    Period Days    Status New    Target Date --   4th OT Rx visit     OT LONG TERM GOAL #3   Title Pt will achieve 10% limb volume reductions  bilaterally from ankle to tibial tuberosity during Intensive Phase CDT to return limbs to typical size and shape ,. reduce infection risk and improve basic andf instrumental ALDs performance.    Time 12    Period Weeks    Status New    Target Date 06/07/21      OT LONG TERM GOAL #4   Title Pt will consistently perform all LE self -care home protocols ( simple self MLD, skin care, therapeutic exercise, compression therapies) daily  with max CG assistance to to achieve max edema reduction, to enanble functional improvements,  including fitting preferred street shoes and standard sized clothing, and to achieve increased AROM and reduced falls risk.    Baseline Max A    Time 12    Period Weeks    Status New    Target Date 06/07/21                   Plan - 03/29/21 1440     Clinical Impression Statement Rt heel pain is resolved. Pt tolerated MLD to RLE/RLQ, concurrent skin care to limb and knee length gradient compression wraps  without increased pain. Attempted to apply wraps higher  today to contain small lobules below the knee and to keep top of bandage in place with less slifding downmn. Cont as per POC.    OT Occupational Profile and History Comprehensive Assessment- Review of records and extensive additional review of physical, cognitive, psychosocial history related to current functional performance    Occupational performance deficits (Please refer to evaluation for  details): ADL's;Work;IADL's;Rest and Sleep;Leisure;Social Participation;Other   role performance   Body Structure / Function / Physical Skills ADL;Skin integrity;Endurance;Balance;Mobility;Strength;Edema;Obesity;Pain;Gait;ROM;Decreased knowledge of precautions;Decreased knowledge of use of DME;IADL   chronic pain   Clinical Decision Making Multiple treatment options, significant modification of task necessary    Comorbidities Affecting Occupational Performance: Presence of comorbidities impacting occupational performance    Modification or Assistance to Complete Evaluation  Max significant modification of tasks or assist is necessary to complete    OT Frequency 2x / week    OT Duration 12 weeks   and PRN to complete CDT Intensive to BILATERAL legs   OT Treatment/Interventions Self-care/ADL training;Therapeutic exercise;Coping strategies training;Therapeutic activities;Manual lymph drainage;Energy conservation;Manual Therapy;DME and/or AE instruction;Compression bandaging;Patient/family education    Plan CDT to BLE, one leg at a time, RL:E first    OT Home Exercise Plan fit with BLE custom flat knit compression knee highs    Recommended Other Services Lymphedema management has a high burden of care, especially for Patients who have difficulty, or who are unable to reach their distal legs and feet.  reach their feet and distal legs to aapply bandages, compression garments, to bathe , inspect skin and groom nails. In this case because Pt is unable to perform  these activities, daily caregiver assistance with the lymphedema self-care home program  between OT visits is essential for achieving clinical success. Without ongoing, daily caregiver assistance during compression bandaging, donning and doffing compression garments, skin care, and MLD , Pt's prognosis is poor. However, with consistent caregiver assistance with lymphedema home program prognosis for improved lymphedema control and reducing infection risk and progression is good.    Consulted and Agree with Plan of Care Patient             Patient will benefit from skilled therapeutic intervention in order to improve the following deficits and impairments:   Body Structure / Function / Physical Skills: ADL, Skin integrity, Endurance, Balance, Mobility, Strength, Edema, Obesity, Pain, Gait, ROM, Decreased knowledge of precautions, Decreased knowledge of use of DME, IADL (chronic pain)       Visit Diagnosis: Lymphedema, not elsewhere classified    Problem List Patient Active Problem List   Diagnosis Date Noted   Osteoarthritis 10/04/2019   Spondylodiscitis 04/26/2019   Lymphedema 04/26/2019   Morbid obesity (HCC) 04/26/2019   BMI 45.0-49.9, adult (HCC) 08/20/2018   Controlled type 2 diabetes mellitus without complication, without long-term current use of insulin (HCC) 11/26/2017   GERD (gastroesophageal reflux disease) 11/26/2017   Hyperlipidemia, mixed 11/26/2017   Hypertension, essential 11/26/2017   Chest pain at rest 11/26/2017      Loel Dubonnet, Nancy, OTR/L, [facility] 03/29/21 2:45 PM   [place] [facility] MAIN [facility] SERVICES [address] [place], [place], [address] Phone: [phone]   Fax:  [phone]  Name: Nancy Riley MRN: [id number] Date of Birth: [birth year]

## 2021-04-02 ENCOUNTER — Other Ambulatory Visit: Payer: Self-pay

## 2021-04-02 ENCOUNTER — Ambulatory Visit: Payer: Medicare PPO | Admitting: Occupational Therapy

## 2021-04-02 DIAGNOSIS — I890 Lymphedema, not elsewhere classified: Secondary | ICD-10-CM

## 2021-04-03 NOTE — Patient Instructions (Signed)

## 2021-04-03 NOTE — Therapy (Signed)
[place] [facility] MAIN [facility] SERVICES [address] [place], [place], [address] Phone: [phone]   Fax:  [phone]  Occupational Therapy Treatment  Patient Details  Name: Nancy Riley MRN: [id number] Date of Birth: June 02, 1956 Referring Provider (OT): Sheppard Plumber, NP   Encounter Date: 04/02/2021   OT End of Session - 04/02/21 1125     Visit Number 8    Number of Visits 36    Date for OT Re-Evaluation 06/07/21    OT Start Time 1115    OT Stop Time 1215    OT Time Calculation (min) 60 min    Activity Tolerance Patient tolerated treatment well;No increased pain;Patient limited by pain    Behavior During Therapy [facility] for tasks assessed/performed             Past Medical History:  Diagnosis Date   Diabetes mellitus without complication (HCC)    DJD (degenerative joint disease)    GERD (gastroesophageal reflux disease)    Hypercholesteremia    Hypertension    Morbid obesity with BMI of 50.0-59.9, adult (HCC)    Neuropathy    Phlegmon    ventral epidural phlegmon identified on lumbar spine MRI at [facility] in Nov 2020   Plantar fasciitis    Sciatica    Spondylodiscitis    lumbar spine (Nov 2020 at [facility])    Past Surgical History:  Procedure Laterality Date   BREAST EXCISIONAL BIOPSY Left 1970   negative   COLONOSCOPY      There were no vitals filed for this visit.   Subjective Assessment - 04/02/21 1126     Subjective  Ms Churchman presents for OT Rx visit 7/ 36 to address worsening BLE lymphedema. Pt arrives seated in a transport wc. Pt presents with RLE knee length compression wraps in place. Pt states she declines to rate pain today.    Pertinent History Spondylodiscitis, BLE lymphedema, HTN, DM, neuropathy, DJD, arthritis, sciatica,  chronic back pain, morbid obesity, (BMI 50-59.9)    Limitations Difficulty walking, impaired transfers/functional mobility, chronic back DJD and knee  OA pain , chronic leg swelling decreased  strength, impaired balance, impaired sensation (neuropathy in feet), increased infection risk, increased fall risk,    Repetition Increases Symptoms    Special Tests + Stemmer bilaterally    Patient Stated Goals get swelling back down and replace worn compression stockings to limit LE progression    Pain Onset Other (comment)   chronic                         OT Treatments/Exercises (OP) - 04/03/21 6387       Manual Therapy   Manual Therapy Edema management (P)     Manual therapy comments skin care throughout MLD to increase hydration and flexibility (P)     Manual Lymphatic Drainage (MLD) MLD to RLE/RLQ with simultaneous skin care to increase skin flexibility and to increase hydration. Utilized modified sjort neck sequence  omitting lateral neck strokes and performing J strokes to clavicular regions only. Pt instructed in diaphragmatic breathing during MLD. Utilized functional inguinal LN and deep adominal pathways working from proximal to distal through functional inguinal LN/. No c/o pain    throughout Rx. (P)     Compression Bandaging RLE gradient compression wrraps using  one 8 and 10 cm wide short stretch wrap, with 2 12 cm wide wraps in gradient pattern over single layer stocinett and 0.4 cm thick rosidal foam. (P)  OT Long Term Goals - 03/09/21 1208       OT LONG TERM GOAL #1   Title Pt will demonstrate understanding of lymphedema precautions and signs/ symptoms of cellulitisby identifying 6 items for each using printed Lymphedema Workbook for reference PRN (modified independence) to limit LE progression and infection risk.    Baseline minA    Time 4    Period Days    Status New    Target Date --   4th OT Rx visit     OT LONG TERM GOAL #2   Title Pt will require Max assist from caregiver who will be able to apply multilayer, gradient compression wraps using correct techniques after skilled training to reduce limb volume and  limity LE progression.    Baseline dependent    Time 4    Period Days    Status New    Target Date --   4th OT Rx visit     OT LONG TERM GOAL #3   Title Pt will achieve 10% limb volume reductions  bilaterally from ankle to tibial tuberosity during Intensive Phase CDT to return limbs to typical size and shape ,. reduce infection risk and improve basic andf instrumental ALDs performance.    Time 12    Period Weeks    Status New    Target Date 06/07/21      OT LONG TERM GOAL #4   Title Pt will consistently perform all LE self -care home protocols ( simple self MLD, skin care, therapeutic exercise, compression therapies) daily  with max CG assistance to to achieve max edema reduction, to enanble functional improvements,  including fitting preferred street shoes and standard sized clothing, and to achieve increased AROM and reduced falls risk.    Baseline Max A    Time 12    Period Weeks    Status New    Target Date 06/07/21                   Plan - 04/03/21 0902     Clinical Impression Statement RLE swelling is slow to respond to CDT this go 'round. This is likely due to intermittent assistance only with compression wrap changes between visits. Provided MLD and simultaneous skin care to RLE/ RLQ as established. Reapplied compression wraps and Pt donned shoe using assistive device and extra time. Cont to support Pt while urging her to secure caregiver / family assistance with wraps and skin care between visits.. Cont as per POC.    OT Occupational Profile and History Comprehensive Assessment- Review of records and extensive additional review of physical, cognitive, psychosocial history related to current functional performance    Occupational performance deficits (Please refer to evaluation for details): ADL's;Work;IADL's;Rest and Sleep;Leisure;Social Participation;Other   role performance   Body Structure / Function / Physical Skills ADL;Skin  integrity;Endurance;Balance;Mobility;Strength;Edema;Obesity;Pain;Gait;ROM;Decreased knowledge of precautions;Decreased knowledge of use of DME;IADL   chronic pain   Clinical Decision Making Multiple treatment options, significant modification of task necessary    Comorbidities Affecting Occupational Performance: Presence of comorbidities impacting occupational performance    Modification or Assistance to Complete Evaluation  Max significant modification of tasks or assist is necessary to complete    OT Frequency 2x / week    OT Duration 12 weeks   and PRN to complete CDT Intensive to BILATERAL legs   OT Treatment/Interventions Self-care/ADL training;Therapeutic exercise;Coping strategies training;Therapeutic activities;Manual lymph drainage;Energy conservation;Manual Therapy;DME and/or AE instruction;Compression bandaging;Patient/family education    Plan CDT to BLE, one  leg at a time, RL:E first    OT Home Exercise Plan fit with BLE custom flat knit compression knee highs    Recommended Other Services Lymphedema management has a high burden of care, especially for Patients who have difficulty, or who are unable to reach their distal legs and feet.  reach their feet and distal legs to aapply bandages, compression garments, to bathe , inspect skin and groom nails. In this case because Pt is unable to perform these activities, daily caregiver assistance with the lymphedema self-care home program  between OT visits is essential for achieving clinical success. Without ongoing, daily caregiver assistance during compression bandaging, donning and doffing compression garments, skin care, and MLD , Pt's prognosis is poor. However, with consistent caregiver assistance with lymphedema home program prognosis for improved lymphedema control and reducing infection risk and progression is good.    Consulted and Agree with Plan of Care Patient             Patient will benefit from skilled therapeutic intervention  in order to improve the following deficits and impairments:   Body Structure / Function / Physical Skills: ADL, Skin integrity, Endurance, Balance, Mobility, Strength, Edema, Obesity, Pain, Gait, ROM, Decreased knowledge of precautions, Decreased knowledge of use of DME, IADL (chronic pain)       Visit Diagnosis: Lymphedema, not elsewhere classified    Problem List Patient Active Problem List   Diagnosis Date Noted   Osteoarthritis 10/04/2019   Spondylodiscitis 04/26/2019   Lymphedema 04/26/2019   Morbid obesity (HCC) 04/26/2019   BMI 45.0-49.9, adult (HCC) 08/20/2018   Controlled type 2 diabetes mellitus without complication, without long-term current use of insulin (HCC) 11/26/2017   GERD (gastroesophageal reflux disease) 11/26/2017   Hyperlipidemia, mixed 11/26/2017   Hypertension, essential 11/26/2017   Chest pain at rest 11/26/2017   Loel Dubonnet, MS, OTR/L, [facility] 04/03/21 11:24 AM    [facility] MAIN [facility] SERVICES [address] [place], [place], [address] Phone: [phone]   Fax:  [phone]  Name: Nancy Riley MRN: [id number] Date of Birth: 09-Feb-1956

## 2021-04-04 ENCOUNTER — Other Ambulatory Visit: Payer: Self-pay

## 2021-04-04 ENCOUNTER — Ambulatory Visit: Payer: Medicare PPO | Attending: Nurse Practitioner | Admitting: Occupational Therapy

## 2021-04-04 DIAGNOSIS — I890 Lymphedema, not elsewhere classified: Secondary | ICD-10-CM | POA: Insufficient documentation

## 2021-04-04 NOTE — Patient Instructions (Signed)

## 2021-04-04 NOTE — Therapy (Signed)
[place] MAIN [facility] SERVICES [address] [place], [place], [address] Phone: [phone]   Fax:  [phone]  Occupational Therapy Treatment  Patient Details  Name: Nancy Riley MRN: [id number] Date of Birth: [birth year] Referring Provider (OT): Eulogio Ditch, NP   Encounter Date: 04/04/2021   OT End of Session - 04/04/21 1114     Visit Number 9    Number of Visits 36    Date for OT Re-Evaluation 06/07/21    OT Start Time 8299    OT Stop Time 1210    OT Time Calculation (min) 65 min    Activity Tolerance Patient tolerated treatment well;No increased pain;Patient limited by pain    Behavior During Therapy [facility] for tasks assessed/performed             Past Medical History:  Diagnosis Date   Diabetes mellitus without complication ([place])    DJD (degenerative joint disease)    GERD (gastroesophageal reflux disease)    Hypercholesteremia    Hypertension    Morbid obesity with BMI of 50.0-59.9, adult ([place])    Neuropathy    Phlegmon    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   Subjective Assessment - 04/04/21 1115     Subjective  Nancy Riley presents for OT Rx visit 9/ 36 to address worsening BLE lymphedema. Pt arrives seated in a transport wc. Pt presents with RLE knee length compression wraps in place. Pt rates bilateral knee pain at 8/10.    Pertinent History Spondylodiscitis, BLE lymphedema, HTN, DM, neuropathy, DJD, arthritis, sciatica,  chronic back pain, morbid obesity, (BMI 50-59.9)    Limitations Difficulty walking, impaired transfers/functional mobility, chronic back DJD and knee  OA pain , chronic leg swelling decreased strength,  impaired balance, impaired sensation (neuropathy in feet), increased infection risk, increased fall risk,    Repetition Increases Symptoms    Special Tests + Stemmer bilaterally    Patient Stated Goals get swelling back down and replace worn compression stockings to limit LE progression    Currently in Pain? Yes    Pain Score 8     Pain Location Knee    Pain Orientation Right;Left    Pain Type Chronic pain    Pain Onset Other (comment)   chronic                LYMPHEDEMA/ONCOLOGY QUESTIONNAIRE - 04/04/21 1244       Right Lower Extremity Lymphedema   Other RLE limb volume from ankle to tibial tuberosity (A-D) = 9865.5 ml today.    Other RLE limb volume is decreased by 0.005% since initially measured on 03/12/21. Compared with volumetric measurements for RLE 2 years ago on 03/24/19, RLE A-D voume is increased by a dramatic 97%.                     OT Treatments/Exercises (OP) - 04/04/21 1242       Manual Therapy   Manual Therapy Edema management    Edema Management RLE comparative limb volumetrics    Compression Bandaging RLE gradient compression wraps from base of toes to  mid thigh using  one 8 and 10 cm wide short stretch wrap, with 4 12 cm wide wraps in gradient pattern over single layer stocinett and 0.4 cm thick rosidal foam.                    OT Education - 04/04/21 1227     Education Details Continued skilled Pt/caregiver education  And LE ADL training throughout visit for lymphedema self care/ home program, including compression wrapping, compression garment and device wear/care, lymphatic pumping ther ex, simple self-MLD, and skin care. Pt specificaly reminded not to leave gradient compression wraps applied in clinic in place for more than 48 hours, at the most, to limit risk of injury to skin and/ or infection risk.    Person(s) Educated Patient    Methods Explanation;Demonstration    Comprehension Verbalized understanding;Need further  instruction;Returned demonstration                 OT Long Term Goals - 04/04/21 1238       OT LONG TERM GOAL #1   Title Pt will demonstrate understanding of lymphedema precautions and signs/ symptoms of cellulitisby identifying 6 items for each using printed Lymphedema Workbook for reference PRN (modified independence) to limit LE progression and infection risk.    Baseline minA    Time 4    Period Days    Status Achieved      OT LONG TERM GOAL #2   Title Pt will require Max assist from caregiver who will be able to apply multilayer, gradient compression wraps using correct techniques after skilled training to reduce limb volume and limity LE progression.    Baseline dependent    Time 4    Period Days    Status Achieved      OT LONG TERM GOAL #3   Title Pt will achieve 10% limb volume reductions  bilaterally from ankle to tibial tuberosity during Intensive Phase CDT to return limbs to typical size and shape ,. reduce infection risk and improve basic andf instrumental ALDs performance.    Time 12    Period Weeks    Status Partially Met   R leg reduction from ankle to knee (A-D) = 0.005% on 9th visit 04/04/21   Target Date 06/07/21      OT LONG TERM GOAL #4   Title Pt will consistently perform all LE self -care home protocols ( simple self MLD, skin care, therapeutic exercise, compression therapies) daily  with max CG assistance to to achieve max edema reduction, to enanble functional improvements,  including fitting preferred street shoes and standard sized clothing, and to achieve increased AROM and reduced falls risk.    Baseline Max A    Time 12    Period Weeks    Status On-going    Target Date 06/07/21      OT LONG TERM GOAL #5   Status Deferred                   Plan - 04/04/21 1228     Clinical Impression Statement Completed RLE limb volumetrics in preparation for progress report next session. RLE volume has actually changed distribution a bit, but  overall limb volume of leg is essentially unchanged since commencing CDT. RLE volume is decreased by 0.005% since initially measured on 03/12/21. What is concerning is there has been a 97.71% limb volume increase in the R leg from ankle to tibial tuberosity since measured 2 years ago at this  time on 03/24/19. I would primarily attribute this dramatic limb volume increase to weight gain. We applied compression wraps above the knee on a trial basis in an effort to move fluid through bottleneck area and beyond large lobule at medial R thigh. Pt instructed to remove wraps immediately if she experiences any atypical SOB or sudden onset of pain. Cont as pewr POC.    OT Occupational Profile and History Comprehensive Assessment- Review of records and extensive additional review of physical, cognitive, psychosocial history related to current functional performance    Occupational performance deficits (Please refer to evaluation for details): ADL's;Work;IADL's;Rest and Sleep;Leisure;Social Participation;Other   role performance   Body Structure / Function / Physical Skills ADL;Skin integrity;Endurance;Balance;Mobility;Strength;Edema;Obesity;Pain;Gait;ROM;Decreased knowledge of precautions;Decreased knowledge of use of DME;IADL   chronic pain   Clinical Decision Making Multiple treatment options, significant modification of task necessary    Comorbidities Affecting Occupational Performance: Presence of comorbidities impacting occupational performance    Modification or Assistance to Complete Evaluation  Max significant modification of tasks or assist is necessary to complete    OT Frequency 2x / week    OT Duration 12 weeks   and PRN to complete CDT Intensive to BILATERAL legs   OT Treatment/Interventions Self-care/ADL training;Therapeutic exercise;Coping strategies training;Therapeutic activities;Manual lymph drainage;Energy conservation;Manual Therapy;DME and/or AE instruction;Compression bandaging;Patient/family  education    Plan CDT to BLE, one leg at a time, RL:E first    OT Home Exercise Plan fit with BLE custom flat knit compression knee highs    Recommended Other Services Lymphedema management has a high burden of care, especially for Patients who have difficulty, or who are unable to reach their distal legs and feet.  reach their feet and distal legs to aapply bandages, compression garments, to bathe , inspect skin and groom nails. In this case because Pt is unable to perform these activities, daily caregiver assistance with the lymphedema self-care home program  between OT visits is essential for achieving clinical success. Without ongoing, daily caregiver assistance during compression bandaging, donning and doffing compression garments, skin care, and MLD , Pt's prognosis is poor. However, with consistent caregiver assistance with lymphedema home program prognosis for improved lymphedema control and reducing infection risk and progression is good.    Consulted and Agree with Plan of Care Patient             Patient will benefit from skilled therapeutic intervention in order to improve the following deficits and impairments:   Body Structure / Function / Physical Skills: ADL, Skin integrity, Endurance, Balance, Mobility, Strength, Edema, Obesity, Pain, Gait, ROM, Decreased knowledge of precautions, Decreased knowledge of use of DME, IADL (chronic pain)       Visit Diagnosis: Lymphedema, not elsewhere classified    Problem List Patient Active Problem List   Diagnosis Date Noted   Osteoarthritis 10/04/2019   Spondylodiscitis 04/26/2019   Lymphedema 04/26/2019   Morbid obesity ([place]) 04/26/2019   BMI 45.0-49.9, adult ([place]) 08/20/2018   Controlled type 2 diabetes mellitus without complication, without long-term current use of insulin ([place]) 11/26/2017   GERD (gastroesophageal reflux disease) 11/26/2017   Hyperlipidemia, mixed 11/26/2017   Hypertension, essential 11/26/2017   Chest pain at  rest 11/26/2017    Andrey Spearman, Nancy, OTR/L, [facility] 04/04/21 12:49 PM   [place] MAIN [facility] SERVICES [place], [place], [address] Phone: [phone]   Fax:  [phone]  Name: Nancy Riley MRN: [id number] Date of Birth: [birth year]

## 2021-04-09 ENCOUNTER — Ambulatory Visit: Payer: Medicare PPO | Admitting: Occupational Therapy

## 2021-04-09 ENCOUNTER — Other Ambulatory Visit: Payer: Self-pay

## 2021-04-09 DIAGNOSIS — I890 Lymphedema, not elsewhere classified: Secondary | ICD-10-CM | POA: Diagnosis not present

## 2021-04-09 NOTE — Therapy (Signed)
[place] MAIN [facility] SERVICES [address] [place], [place], [address] Phone: [phone]   Fax:  [phone]  Occupational Therapy Treatment Note and Progress Report Reporting Period  03/08/21 - 04/09/21  Patient Details  Name: KAZARIA GAERTNER MRN: [id number] Date of Birth: May 19, 1956 Referring Provider (OT): Eulogio Ditch, NP   Encounter Date: 04/09/2021   OT End of Session - 04/09/21 1105     Visit Number 10    Number of Visits 36    Date for OT Re-Evaluation 06/07/21    OT Start Time 1100    OT Stop Time 1215    OT Time Calculation (min) 75 min    Equipment Utilized During Treatment [employer] advanced sequential compression device trial on RLE, 30-50 mmHg, for 1 hr    Activity Tolerance Patient tolerated treatment well;No increased pain;Patient limited by pain    Behavior During Therapy [facility] for tasks assessed/performed             Past Medical History:  Diagnosis Date   Diabetes mellitus without complication ([place])    DJD (degenerative joint disease)    GERD (gastroesophageal reflux disease)    Hypercholesteremia    Hypertension    Morbid obesity with BMI of 50.0-59.9, adult ([place])    Neuropathy    Phlegmon    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   Subjective Assessment - 04/09/21 1140     Subjective  Ms Norgard presents for OT Rx visit 10/36 to address worsening BLE lymphedema. Pt arrives seated in a transport wc. Pt presents with RLE knee length compression wraps in place. Pt rates bilateral knee pain at 8/10. Manufacturer's rep for [employer], Jerrell Mylar, is here today to assist w/ trial of basic lymphedema "pump"  (G2542) and Flexitouch advanced sequential pneumatic compression device (H0623) to address primary LLE/ LLQ lymphedema (I89.0). Pt has used   a basic compression "pump" for the past 2 years, but significant lower extremity and trunckal swelling persist, and have worsened over time.    Pertinent History Spondylodiscitis, BLE lymphedema, HTN, DM, neuropathy, DJD, arthritis, sciatica,  chronic back pain, morbid obesity, (BMI 50-59.9)    Limitations Difficulty walking, impaired transfers/functional mobility, chronic back DJD and knee  OA pain , chronic leg swelling decreased strength, impaired balance, impaired sensation (neuropathy in feet), increased infection risk, increased fall risk,    Repetition Increases Symptoms    Special Tests + Stemmer bilaterally    Patient Stated Goals get swelling back down and replace worn compression stockings to limit LE progression    Pain Onset Other (comment)   chronic                         OT Treatments/Exercises (OP) - 04/09/21 0001       ADLs   ADL Education Given Yes      Manual Therapy   Manual Therapy Edema management    Edema Management RLE Flexitouch trial =-1 hr 30-50 mmHg    Compression Bandaging RLE gradient compression wraps from base of toes to mid thigh using  one  8 and 10 cm wide short stretch wrap, with 4 12 cm wide wraps in gradient pattern over single layer stocinett and 0.4 cm thick rosidal foam.                    OT Education - 04/09/21 1215     Education Details Pt/ family education for Flexitouch advanced sequential pneumatic compression device, or "pump", including how it works, how it differs from basic pneumatic "pump",  indications, contraindications and  precaution and care and use routine. Patient's questions were answered throughout trial and handouts and Internet resources given for reference.    Person(s) Educated Patient    Methods Explanation;Demonstration    Comprehension Verbalized  understanding;Need further instruction;Returned demonstration                 OT Long Term Goals - 04/09/21 1119       OT LONG TERM GOAL #1   Title Pt will demonstrate understanding of lymphedema precautions and signs/ symptoms of cellulitisby identifying 6 items for each using printed Lymphedema Workbook for reference PRN (modified independence) to limit LE progression and infection risk.    Baseline minA    Time 4    Period Days    Status Achieved      OT LONG TERM GOAL #2   Title Pt will require Max assist from caregiver who will be able to apply multilayer, gradient compression wraps using correct techniques after skilled training to reduce limb volume and limity LE progression.    Baseline dependent    Time 4    Period Days    Status Achieved      OT LONG TERM GOAL #3   Title Pt will achieve 10% limb volume reductions  bilaterally from ankle to tibial tuberosity during Intensive Phase CDT to return limbs to typical size and shape ,. reduce infection risk and improve basic andf instrumental ALDs performance.    Time 12    Period Weeks    Status Partially Met   R leg reduction from ankle to knee (A-D) = 0.005% on  04/04/21. Since commencing OT for CDT RLE limb volume, altho reduced slightly, is essentially unchanged on the 9th visit.   Target Date 06/07/21      OT LONG TERM GOAL #4   Title Pt will consistently perform all LE self -care home protocols ( modified simple self MLD, skin care, therapeutic exercise, compression therapies) daily  with max CG assistance to to achieve max edema reduction, to enanble functional improvements,  including fitting preferred street shoes and standard sized clothing, and to achieve increased AROM and reduced falls risk.    Baseline Max A    Time 12    Period Weeks    Status On-going   Pt is unable to reach feet to perform sinmple self MLD and skin care. Flexitouch advanced sequential compression device with proximal to distal sequencing,  like MLD, is medically necessary to limit progression.   Target Date 06/07/21      OT LONG TERM GOAL #5   Status Deferred                   Plan - 04/09/21 1321     Clinical Impression Statement Please review LONG TERM GOALS sectrion for progress towards goals. Dense edema and subcutaneous fibrosis have been slow to respong to CDT to date. Pt underwent a trial of the advanced Flexitouch sequential pneumatic compression device, or "compression pump" in clinic this morning.  Ms.Girardot returns to Occupational Therapy for a repeat course of CDT as BLE lymphedema has progressed. Despite using the [employer]ress E0651 basic compression pump on a daily basis for 60 minutes (87mHg) for 2 years, significant increases in leg swelling and fibrosis are observed in legs, thighs and abdomen. Per Pt report, the basic device did not accommodate limb size, it caused constriction and pain at distal legs, By clinical assessment it has not assisted with control of limb or truncal swelling and basic device use has not resulted in improvement in lymphedema.   Pt is currently repeating conservative CDT 2x weekly , including MLD, multilayer gradient compression wraps, skin care and therex. As of week 5 RLE/RLQ volume is essentially unchanged with a 0.005 reduction measured in the RLE and significant trunkal and thigh edema remains unchanged. Symptoms of hyperpigmentation, hyperkeratosis, dense fibrosis, pain and impaired ROM persist.  Ms GPothiercompleted a 1hour trial with the advanced Flexitouch Plus pneumatic compression device (([id number] this morning using 30-50 mmHg compression on the RLE/RLQ without increased pain.   This advanced device with BLE garments and truncal garment is medically necessary to assist this patient with optimal daily lymphedema self-care over time at home. The advanced, 32-chamber Flexitouch is the only available sequential pneumatic compression device that provides proximal to distal fluid return  via regional lymph nodes, deep abdominal pathways and the thoracic duct to the heart. The basic pneumatic pump is not appropriate for this patient because it provides distal-to-proximal, retrograde massage mobilizing tissue fluid against back abruptly ending distal to the regional lymph nodes in the groin leaving a dense ring of protein rich fluid distal to the inguinal lymph nodes in the thigh. The Flexitouch Plus [id number] Pneumatic Compression Device is medically necessary to treat this patient's symptoms of BLE, thigh, and trunk lymphedema.    OT Occupational Profile and History Comprehensive Assessment- Review of records and extensive additional review of physical, cognitive, psychosocial history related to current functional performance    Occupational performance deficits (Please refer to evaluation for details): ADL's;Work;IADL's;Rest and Sleep;Leisure;Social Participation;Other    Body Structure / Function / Physical Skills ADL;Skin integrity;Endurance;Balance;Mobility;Strength;Edema;Obesity;Pain;Gait;ROM;Decreased knowledge of precautions;Decreased knowledge of use of DME;IADL    Clinical Decision Making Multiple treatment options, significant modification of task necessary    Comorbidities Affecting Occupational Performance: Presence of comorbidities impacting occupational performance    Modification or Assistance to Complete Evaluation  Max significant modification of tasks or assist is necessary to complete    OT Frequency 2x / week    OT Duration 12 weeks    OT Treatment/Interventions Self-care/ADL training;Therapeutic exercise;Coping strategies training;Therapeutic activities;Manual lymph drainage;Energy conservation;Manual Therapy;DME and/or AE instruction;Compression bandaging;Patient/family education    Plan CDT to BLE, one leg at a time, RL:E first    OT Home Exercise Plan fit with BLE custom flat knit compression knee highs    Recommended Other Services Lymphedema management has  a high burden of care, especially for Patients who have difficulty, or who are unable to reach their distal legs and feet.  reach their feet and distal legs to aapply bandages, compression garments, to bathe , inspect skin and groom nails. In this case because Pt is unable to perform these activities, daily caregiver assistance with the lymphedema self-care home program  between OT visits is essential for achieving clinical success. Without ongoing, daily caregiver assistance during compression bandaging, donning and doffing compression garments, skin care, and MLD , Pt's prognosis is poor. However, with consistent caregiver assistance with lymphedema home program prognosis  for improved lymphedema control and reducing infection risk and progression is good.    Consulted and Agree with Plan of Care Patient             Patient will benefit from skilled therapeutic intervention in order to improve the following deficits and impairments:   Body Structure / Function / Physical Skills: ADL, Skin integrity, Endurance, Balance, Mobility, Strength, Edema, Obesity, Pain, Gait, ROM, Decreased knowledge of precautions, Decreased knowledge of use of DME, IADL       Visit Diagnosis: Lymphedema, not elsewhere classified    Problem List Patient Active Problem List   Diagnosis Date Noted   Osteoarthritis 10/04/2019   Spondylodiscitis 04/26/2019   Lymphedema 04/26/2019   Morbid obesity ([place]) 04/26/2019   BMI 45.0-49.9, adult ([place]) 08/20/2018   Controlled type 2 diabetes mellitus without complication, without long-term current use of insulin ([place]) 11/26/2017   GERD (gastroesophageal reflux disease) 11/26/2017   Hyperlipidemia, mixed 11/26/2017   Hypertension, essential 11/26/2017   Chest pain at rest 11/26/2017    Andrey Spearman, MS, OTR/L, [facility] 04/09/21 2:01 PM   [place] MAIN [facility] SERVICES [place], [place], [address] Phone:  [phone]   Fax:  [phone]  Name: AKEIBA AXELSON MRN: [id number] Date of Birth: 1956/02/14

## 2021-04-09 NOTE — Patient Instructions (Signed)

## 2021-04-11 ENCOUNTER — Ambulatory Visit: Payer: Medicare PPO | Admitting: Occupational Therapy

## 2021-04-11 ENCOUNTER — Other Ambulatory Visit: Payer: Self-pay

## 2021-04-11 DIAGNOSIS — I890 Lymphedema, not elsewhere classified: Secondary | ICD-10-CM

## 2021-04-11 NOTE — Therapy (Signed)
[place] [facility] SERVICES [address] [place], [place], [address] Phone: [phone]   Fax:  [phone]  Occupational Therapy Treatment  Patient Details  Name: Nancy Riley MRN: [id number] Date of Birth: 08-18-1955 Referring Provider (OT): Eulogio Ditch, NP   Encounter Date: 04/11/2021   OT End of Session - 04/11/21 1118     Visit Number 11    Number of Visits 36    Date for OT Re-Evaluation 06/07/21    OT Start Time 1110    OT Stop Time 1210    OT Time Calculation (min) 60 min    Equipment Utilized During Treatment Tactile Medical advanced sequential compression device trial on RLE, 30-50 mmHg, for 1 hr    Activity Tolerance Patient tolerated treatment well;No increased pain;Patient limited by pain    Behavior During Therapy [facility] for tasks assessed/performed             Past Medical History:  Diagnosis Date   Diabetes mellitus without complication ([place])    DJD (degenerative joint disease)    GERD (gastroesophageal reflux disease)    Hypercholesteremia    Hypertension    Morbid obesity with BMI of 50.0-59.9, adult ([place])    Neuropathy    Phlegmon    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   Subjective Assessment - 04/11/21 1316     Subjective  Ms Scholler presents for OT Rx visit 11/36 to address worsening BLE lymphedema. Pt arrives seated in a transport wc. Pt presents with RLE knee length compression wraps in place. Pt rates bilateral knee pain at 8/10.    Pertinent History Spondylodiscitis, BLE lymphedema, HTN, DM, neuropathy, DJD, arthritis, sciatica,  chronic back pain, morbid obesity, (BMI 50-59.9)    Limitations Difficulty  walking, impaired transfers/functional mobility, chronic back DJD and knee  OA pain , chronic leg swelling decreased strength, impaired balance, impaired sensation (neuropathy in feet), increased infection risk, increased fall risk,    Repetition Increases Symptoms    Special Tests + Stemmer bilaterally    Patient Stated Goals get swelling back down and replace worn compression stockings to limit LE progression    Pain Onset Other (comment)   chronic                         OT Treatments/Exercises (OP) - 04/11/21 1316       ADLs   ADL Education Given Yes      Manual Therapy   Manual Therapy Edema management;Manual Lymphatic Drainage (MLD);Compression Bandaging    Manual therapy comments skin care throughout MLD to increase hydration and flexibility    Manual Lymphatic Drainage (MLD) MLD to RLE/RLQ with simultaneous skin care to increase skin flexibility and to increase hydration. Utilized modified sjort neck sequence  omitting lateral neck strokes and performing J strokes to clavicular regions only. Pt instructed in diaphragmatic breathing during MLD. Utilized functional inguinal LN and deep adominal pathways working from proximal to distal through functional inguinal LN/. No c/o pain    throughout Rx.    Compression Bandaging RLE gradient compression wrraps using  one 8 and 10 cm wide short stretch wrap, with 2 12 cm wide wraps in gradient pattern over single layer stocinett and 0.4 cm thick rosidal foam.                    OT Education - 04/11/21 1317     Education Details Continued skilled Pt/caregiver education  And LE ADL training throughout visit for lymphedema self care/ home program, including compression wrapping, compression garment and device wear/care, lymphatic pumping ther ex, simple self-MLD, and skin care. Pt specificaly reminded not to leave gradient compression wraps applied in clinic in place for more than 48 hours, at the most, to limit risk of  injury to skin and/ or infection risk.    Person(s) Educated Patient    Methods Explanation;Demonstration    Comprehension Verbalized understanding;Need further instruction;Returned demonstration                 OT Long Term Goals - 04/09/21 1119       OT LONG TERM GOAL #1   Title Pt will demonstrate understanding of lymphedema precautions and signs/ symptoms of cellulitisby identifying 6 items for each using printed Lymphedema Workbook for reference PRN (modified independence) to limit LE progression and infection risk.    Baseline minA    Time 4    Period Days    Status Achieved      OT LONG TERM GOAL #2   Title Pt will require Max assist from caregiver who will be able to apply multilayer, gradient compression wraps using correct techniques after skilled training to reduce limb volume and limity LE progression.    Baseline dependent    Time 4    Period Days    Status Achieved      OT LONG TERM GOAL #3   Title Pt will achieve 10% limb volume reductions  bilaterally from ankle to tibial tuberosity during Intensive Phase CDT to return limbs to typical size and shape ,. reduce infection risk and improve basic andf instrumental ALDs performance.    Time 12    Period Weeks    Status Partially Met   R leg reduction from ankle to knee (A-D) = 0.005% on  04/04/21. Since commencing OT for CDT RLE limb volume, altho reduced slightly, is essentially unchanged on the 9th visit.   Target Date 06/07/21      OT LONG TERM GOAL #4   Title Pt will consistently perform all LE self -care home protocols ( modified simple self MLD, skin care, therapeutic exercise, compression therapies) daily  with max CG assistance to to achieve max edema reduction, to enanble functional improvements,  including fitting preferred street shoes and standard sized clothing, and to achieve increased AROM and reduced falls risk.    Baseline Max A    Time 12    Period Weeks    Status On-going   Pt is unable to reach  feet to perform sinmple self MLD and skin care. Flexitouch advanced sequential compression device with proximal to distal sequencing, like MLD, is medically necessary to limit progression.   Target Date 06/07/21      OT LONG TERM GOAL #5   Status Deferred                   Plan - 04/11/21 1309     Clinical Impression Statement Provided RLE/RLQ MLD utilizing functional LN in seated position w leg elevated  . Provided skin care simultaneouslyin effort to increase skin mobility  and hydration to reduce infection risk. Medial thigh lobule has become very dense and fibrotic over time, and skin is very inflexible in this area. If these medial thigh lobules continue to worsen over time , I suspect the very fine and fragile superficial  lymphatics make break  under the strain of the lobule weight when Pt is standing upright. We're hopeful that Pt's insurance benefits will assist her with coverage of the Flexitouch device, trialed last session, so lymphedema in thighs con be netter decongested. Body habitus, tissue density and Pt's lack of mobility severly limit treatment of this worsening proximal edema. Cont as per POC.    OT Occupational Profile and History Comprehensive Assessment- Review of records and extensive additional review of physical, cognitive, psychosocial history related to current functional performance    Occupational performance deficits (Please refer to evaluation for details): ADL's;Work;IADL's;Rest and Sleep;Leisure;Social Participation;Other    Body Structure / Function / Physical Skills ADL;Skin integrity;Endurance;Balance;Mobility;Strength;Edema;Obesity;Pain;Gait;ROM;Decreased knowledge of precautions;Decreased knowledge of use of DME;IADL    Clinical Decision Making Multiple treatment options, significant modification of task necessary    Comorbidities Affecting Occupational Performance: Presence of comorbidities impacting occupational performance    Modification or  Assistance to Complete Evaluation  Max significant modification of tasks or assist is necessary to complete    OT Frequency 2x / week    OT Duration 12 weeks    OT Treatment/Interventions Self-care/ADL training;Therapeutic exercise;Coping strategies training;Therapeutic activities;Manual lymph drainage;Energy conservation;Manual Therapy;DME and/or AE instruction;Compression bandaging;Patient/family education    Plan CDT to BLE, one leg at a time, RL:E first    OT Home Exercise Plan fit with BLE custom flat knit compression knee highs    Recommended Other Services Lymphedema management has a high burden of care, especially for Patients who have difficulty, or who are unable to reach their distal legs and feet.  reach their feet and distal legs to aapply bandages, compression garments, to bathe , inspect skin and groom nails. In this case because Pt is unable to perform these activities, daily caregiver assistance with the lymphedema self-care home program  between OT visits is essential for achieving clinical success. Without ongoing, daily caregiver assistance during compression bandaging, donning and doffing compression garments, skin care, and MLD , Pt's prognosis is poor. However, with consistent caregiver assistance with lymphedema home program prognosis for improved lymphedema control and reducing infection risk and progression is good.    Consulted and Agree with Plan of Care Patient             Patient will benefit from skilled therapeutic intervention in order to improve the following deficits and impairments:   Body Structure / Function / Physical Skills: ADL, Skin integrity, Endurance, Balance, Mobility, Strength, Edema, Obesity, Pain, Gait, ROM, Decreased knowledge of precautions, Decreased knowledge of use of DME, IADL       Visit Diagnosis: Lymphedema, not elsewhere classified    Problem List Patient Active Problem List   Diagnosis Date Noted   Osteoarthritis 10/04/2019    Spondylodiscitis 04/26/2019   Lymphedema 04/26/2019   Morbid obesity ([place]) 04/26/2019   BMI 45.0-49.9, adult ([place]) 08/20/2018   Controlled type 2 diabetes mellitus without complication, without long-term current use of insulin ([place]) 11/26/2017   GERD (gastroesophageal reflux disease) 11/26/2017   Hyperlipidemia, mixed 11/26/2017   Hypertension, essential 11/26/2017   Chest pain at rest 11/26/2017   Andrey Spearman, MS, OTR/L, CLT-LANA 04/11/21 1:18 PM   [place] [facility] [place],  [place], [address] Phone: [phone]   Fax:  [phone]  Name: Nancy Riley MRN: [id number] Date of Birth: Dec 05, 1955

## 2021-04-11 NOTE — Patient Instructions (Signed)

## 2021-04-16 ENCOUNTER — Ambulatory Visit: Payer: Medicare PPO | Admitting: Occupational Therapy

## 2021-04-16 ENCOUNTER — Other Ambulatory Visit: Payer: Self-pay

## 2021-04-16 DIAGNOSIS — I890 Lymphedema, not elsewhere classified: Secondary | ICD-10-CM

## 2021-04-16 NOTE — Patient Instructions (Signed)

## 2021-04-16 NOTE — Therapy (Signed)
[place] [facility] SERVICES [address] [place], [place], [address] Phone: [phone]   Fax:  [phone]  Occupational Therapy Treatment  Patient Details  Name: Nancy Riley MRN: [id number] Date of Birth: 1956-03-02 Referring Provider (OT): Eulogio Ditch, NP   Encounter Date: 04/16/2021   OT End of Session - 04/16/21 1105     Visit Number 12    Number of Visits 36    Date for OT Re-Evaluation 06/07/21    OT Start Time 1100    OT Stop Time 1200    OT Time Calculation (min) 60 min    Equipment Utilized During Treatment Tactile Medical advanced sequential compression device trial on RLE, 30-50 mmHg, for 1 hr    Activity Tolerance Patient tolerated treatment well;No increased pain;Patient limited by pain    Behavior During Therapy [facility] for tasks assessed/performed             Past Medical History:  Diagnosis Date   Diabetes mellitus without complication ([place])    DJD (degenerative joint disease)    GERD (gastroesophageal reflux disease)    Hypercholesteremia    Hypertension    Morbid obesity with BMI of 50.0-59.9, adult ([place])    Neuropathy    Phlegmon    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   Subjective Assessment - 04/16/21 1109     Subjective  Ms Muench presents for OT Rx visit 12/36 to address worsening BLE lymphedema. Pt arrives seated in a transport wc. Pt presents with RLE knee length compression wraps in place. Pt rates bilateral knee pain at 5/10. Pt reports she had assistance with changing compression wraps during weekend .    Pertinent History Spondylodiscitis, BLE lymphedema, HTN, DM, neuropathy, DJD, arthritis,  sciatica,  chronic back pain, morbid obesity, (BMI 50-59.9)    Limitations Difficulty walking, impaired transfers/functional mobility, chronic back DJD and knee  OA pain , chronic leg swelling decreased strength, impaired balance, impaired sensation (neuropathy in feet), increased infection risk, increased fall risk,    Repetition Increases Symptoms    Special Tests + Stemmer bilaterally    Patient Stated Goals get swelling back down and replace worn compression stockings to limit LE progression    Pain Onset Other (comment)   chronic                         OT Treatments/Exercises (OP) - 04/16/21 1110       ADLs   ADL Education Given Yes      Manual Therapy   Manual Therapy Edema management;Manual Lymphatic Drainage (MLD);Compression Bandaging    Manual therapy comments skin care throughout MLD to increase hydration and flexibility    Manual Lymphatic Drainage (MLD) MLD to RLE/RLQ with simultaneous skin care to increase skin flexibility and to increase hydration. Utilized modified sjort neck sequence  omitting lateral neck strokes and performing J strokes to clavicular regions only. Pt instructed in diaphragmatic breathing during MLD. Utilized functional inguinal LN and deep adominal pathways working from proximal to distal through functional inguinal LN/. No c/o pain    throughout  Rx.    Compression Bandaging RLE gradient compression wrraps using  one 8 and 10 cm wide short stretch wrap, with 2 12 cm wide wraps in gradient pattern over single layer stocinett and 0.4 cm thick rosidal foam.                    OT Education - 04/16/21 1111     Education Details Continued skilled Pt/caregiver education  And LE ADL training throughout visit for lymphedema self care/ home program, including compression wrapping, compression garment and device wear/care, lymphatic pumping ther ex, simple self-MLD, and skin care. Pt specificaly reminded not to leave gradient compression  wraps applied in clinic in place for more than 48 hours, at the most, to limit risk of injury to skin and/ or infection risk.    Person(s) Educated Patient    Methods Explanation;Demonstration    Comprehension Verbalized understanding;Need further instruction;Returned demonstration                 OT Long Term Goals - 04/09/21 1119       OT LONG TERM GOAL #1   Title Pt will demonstrate understanding of lymphedema precautions and signs/ symptoms of cellulitisby identifying 6 items for each using printed Lymphedema Workbook for reference PRN (modified independence) to limit LE progression and infection risk.    Baseline minA    Time 4    Period Days    Status Achieved      OT LONG TERM GOAL #2   Title Pt will require Max assist from caregiver who will be able to apply multilayer, gradient compression wraps using correct techniques after skilled training to reduce limb volume and limity LE progression.    Baseline dependent    Time 4    Period Days    Status Achieved      OT LONG TERM GOAL #3   Title Pt will achieve 10% limb volume reductions  bilaterally from ankle to tibial tuberosity during Intensive Phase CDT to return limbs to typical size and shape ,. reduce infection risk and improve basic andf instrumental ALDs performance.    Time 12    Period Weeks    Status Partially Met   R leg reduction from ankle to knee (A-D) = 0.005% on  04/04/21. Since commencing OT for CDT RLE limb volume, altho reduced slightly, is essentially unchanged on the 9th visit.   Target Date 06/07/21      OT LONG TERM GOAL #4   Title Pt will consistently perform all LE self -care home protocols ( modified simple self MLD, skin care, therapeutic exercise, compression therapies) daily  with max CG assistance to to achieve max edema reduction, to enanble functional improvements,  including fitting preferred street shoes and standard sized clothing, and to achieve increased AROM and reduced falls risk.     Baseline Max A    Time 12    Period Weeks    Status On-going   Pt is unable to reach feet to perform sinmple self MLD and skin care. Flexitouch advanced sequential compression device with proximal to distal sequencing, like MLD, is medically necessary to limit progression.   Target Date 06/07/21      OT LONG TERM GOAL #5   Status Deferred                   Plan - 04/16/21 1211     Clinical Impression Statement .RLE well decongested  from mid leg to ankle this morning  after weekend interval, nut compression wraps continue to slide down at top edge leaving medial leg lobule uncompressed and swollen. Changed wrap configuration   today in an effort to keep top edge in place bu adding second full roll of foam  at top  endge first , then downward to cover lobule, and ending with last 1/2 of roll on oblique angle to cover lobule and ending at tibial tuberosity. added 4th 12 cm wide short stretch compression wrap atas top layer repeating this oblique foam application. Pt tolerated all aspects of OT for CDT. Cont as per POC, Order R custom knee high replacement as soon as we're able to decongest  medial leg lobule.    OT Occupational Profile and History Comprehensive Assessment- Review of records and extensive additional review of physical, cognitive, psychosocial history related to current functional performance    Occupational performance deficits (Please refer to evaluation for details): ADL's;Work;IADL's;Rest and Sleep;Leisure;Social Participation;Other    Body Structure / Function / Physical Skills ADL;Skin integrity;Endurance;Balance;Mobility;Strength;Edema;Obesity;Pain;Gait;ROM;Decreased knowledge of precautions;Decreased knowledge of use of DME;IADL    Clinical Decision Making Multiple treatment options, significant modification of task necessary    Comorbidities Affecting Occupational Performance: Presence of comorbidities impacting occupational performance    Modification or Assistance  to Complete Evaluation  Max significant modification of tasks or assist is necessary to complete    OT Frequency 2x / week    OT Duration 12 weeks    OT Treatment/Interventions Self-care/ADL training;Therapeutic exercise;Coping strategies training;Therapeutic activities;Manual lymph drainage;Energy conservation;Manual Therapy;DME and/or AE instruction;Compression bandaging;Patient/family education    Plan CDT to BLE, one leg at a time, RL:E first    OT Home Exercise Plan fit with BLE custom flat knit compression knee highs    Recommended Other Services Lymphedema management has a high burden of care, especially for Patients who have difficulty, or who are unable to reach their distal legs and feet.  reach their feet and distal legs to aapply bandages, compression garments, to bathe , inspect skin and groom nails. In this case because Pt is unable to perform these activities, daily caregiver assistance with the lymphedema self-care home program  between OT visits is essential for achieving clinical success. Without ongoing, daily caregiver assistance during compression bandaging, donning and doffing compression garments, skin care, and MLD , Pt's prognosis is poor. However, with consistent caregiver assistance with lymphedema home program prognosis for improved lymphedema control and reducing infection risk and progression is good.    Consulted and Agree with Plan of Care Patient             Patient will benefit from skilled therapeutic intervention in order to improve the following deficits and impairments:   Body Structure / Function / Physical Skills: ADL, Skin integrity, Endurance, Balance, Mobility, Strength, Edema, Obesity, Pain, Gait, ROM, Decreased knowledge of precautions, Decreased knowledge of use of DME, IADL       Visit Diagnosis: Lymphedema, not elsewhere classified    Problem List Patient Active Problem List   Diagnosis Date Noted   Osteoarthritis 10/04/2019    Spondylodiscitis 04/26/2019   Lymphedema 04/26/2019   Morbid obesity ([place]) 04/26/2019   BMI 45.0-49.9, adult ([place]) 08/20/2018   Controlled type 2 diabetes mellitus without complication, without long-term current use of insulin ([place]) 11/26/2017   GERD (gastroesophageal reflux disease) 11/26/2017   Hyperlipidemia, mixed 11/26/2017   Hypertension, essential 11/26/2017   Chest pain at rest 11/26/2017   Andrey Spearman, MS, OTR/L, CLT-LANA 04/16/21 12:18 PM   [address]  [place], [place], [address] Phone: [phone]   Fax:  [phone]  Name: Nancy Riley MRN: [id number] Date of Birth: 11/09/1955

## 2021-04-18 ENCOUNTER — Other Ambulatory Visit: Payer: Self-pay

## 2021-04-18 ENCOUNTER — Ambulatory Visit: Payer: Medicare PPO | Admitting: Occupational Therapy

## 2021-04-18 DIAGNOSIS — I890 Lymphedema, not elsewhere classified: Secondary | ICD-10-CM

## 2021-04-18 NOTE — Patient Instructions (Signed)

## 2021-04-18 NOTE — Therapy (Signed)
[place] MAIN [facility] SERVICES [address] [place], [place], [address] Phone: [phone]   Fax:  [phone]  Occupational Therapy Treatment  Patient Details  Name: Nancy Riley MRN: [id number] Date of Birth: Feb 02, 1956 Referring Provider (OT): Eulogio Ditch, NP   Encounter Date: 04/18/2021   OT End of Session - 04/18/21 1051     Visit Number 13    Number of Visits 36    Date for OT Re-Evaluation 06/07/21    OT Start Time 1055    OT Stop Time 4270    OT Time Calculation (min) 70 min    Activity Tolerance Patient tolerated treatment well;No increased pain;Patient limited by pain    Behavior During Therapy [facility] for tasks assessed/performed             Past Medical History:  Diagnosis Date   Diabetes mellitus without complication (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   Subjective Assessment - 04/18/21 1052     Subjective  Ms Stief presents for OT Rx visit 13/36 to address worsening BLE lymphedema. Pt arrives seated in a transport wc. Pt presents with RLE knee length compression wraps in place. Pt rates bilateral knee pain at 5/10. Pt    Pertinent History Spondylodiscitis, BLE lymphedema, HTN, DM, neuropathy, DJD, arthritis, sciatica,  chronic back pain, morbid obesity, (BMI 50-59.9)    Limitations Difficulty walking, impaired transfers/functional mobility, chronic back DJD and knee  OA pain , chronic leg swelling decreased strength,  impaired balance, impaired sensation (neuropathy in feet), increased infection risk, increased fall risk,    Repetition Increases Symptoms    Special Tests + Stemmer bilaterally    Patient Stated Goals get swelling back down and replace worn compression stockings to limit LE progression    Pain Onset Other (comment)   chronic                         OT Treatments/Exercises (OP) - 04/18/21 1054       ADLs   ADL Education Given Yes      Manual Therapy   Manual Therapy Edema management;Manual Lymphatic Drainage (MLD);Compression Bandaging    Manual therapy comments skin care throughout MLD to increase hydration and flexibility    Manual Lymphatic Drainage (MLD) MLD to RLE/RLQ with simultaneous skin care to increase skin flexibility and to increase hydration. Utilized modified sjort neck sequence  omitting lateral neck strokes and performing J strokes to clavicular regions only. Pt instructed in diaphragmatic breathing during MLD. Utilized functional inguinal LN and deep adominal pathways working from proximal to distal through functional inguinal LN/. No c/o pain    throughout Rx.    Compression Bandaging RLE gradient compression wrraps using  one 8 and 10 cm wide short stretch wrap, with 2 12 cm wide wraps in gradient pattern over single  layer stocinett and 0.4 cm thick rosidal foam.                    OT Education - 04/18/21 1054     Education Details Continued skilled Pt/caregiver education  And LE ADL training throughout visit for lymphedema self care/ home program, including compression wrapping, compression garment and device wear/care, lymphatic pumping ther ex, simple self-MLD, and skin care. Pt specificaly reminded not to leave gradient compression wraps applied in clinic in place for more than 48 hours, at the most, to limit risk of injury to skin and/ or infection risk.    Person(s) Educated Patient    Methods Explanation;Demonstration    Comprehension  Verbalized understanding;Need further instruction;Returned demonstration                 OT Long Term Goals - 04/09/21 1119       OT LONG TERM GOAL #1   Title Pt will demonstrate understanding of lymphedema precautions and signs/ symptoms of cellulitisby identifying 6 items for each using printed Lymphedema Workbook for reference PRN (modified independence) to limit LE progression and infection risk.    Baseline minA    Time 4    Period Days    Status Achieved      OT LONG TERM GOAL #2   Title Pt will require Max assist from caregiver who will be able to apply multilayer, gradient compression wraps using correct techniques after skilled training to reduce limb volume and limity LE progression.    Baseline dependent    Time 4    Period Days    Status Achieved      OT LONG TERM GOAL #3   Title Pt will achieve 10% limb volume reductions  bilaterally from ankle to tibial tuberosity during Intensive Phase CDT to return limbs to typical size and shape ,. reduce infection risk and improve basic andf instrumental ALDs performance.    Time 12    Period Weeks    Status Partially Met   R leg reduction from ankle to knee (A-D) = 0.005% on  04/04/21. Since commencing OT for CDT RLE limb volume, altho reduced slightly, is essentially unchanged on the 9th visit.   Target Date 06/07/21      OT LONG TERM GOAL #4   Title Pt will consistently perform all LE self -care home protocols ( modified simple self MLD, skin care, therapeutic exercise, compression therapies) daily  with max CG assistance to to achieve max edema reduction, to enanble functional improvements,  including fitting preferred street shoes and standard sized clothing, and to achieve increased AROM and reduced falls risk.    Baseline Max A    Time 12    Period Weeks    Status On-going   Pt is unable to reach feet to perform sinmple self MLD and skin care. Flexitouch advanced sequential compression device with proximal to distal  sequencing, like MLD, is medically necessary to limit progression.   Target Date 06/07/21      OT LONG TERM GOAL #5   Status Deferred                   Plan - 04/18/21 1227     Clinical Impression Statement Bandages   stayed in place over lower medial leg lobule reducing volume and density ~ 50%. pplying wraps with oblique top edge captured this area. P5ovided RLE MLD and simultaneous skin care with low ph castor oil before re-applying multilayer wraps using  same technique. Reviewed this with patient again so she can direct caregivers to duplicate it. Once this smaller lobule is decongested we will fit with compression garment strong enough to contain and control it without being too difficult for Pt to don and doff. Cont as per POC.    OT Occupational Profile and History Comprehensive Assessment- Review of records and extensive additional review of physical, cognitive, psychosocial history related to current functional performance    Occupational performance deficits (Please refer to evaluation for details): ADL's;Work;IADL's;Rest and Sleep;Leisure;Social Participation;Other    Body Structure / Function / Physical Skills ADL;Skin integrity;Endurance;Balance;Mobility;Strength;Edema;Obesity;Pain;Gait;ROM;Decreased knowledge of precautions;Decreased knowledge of use of DME;IADL    Clinical Decision Making Multiple treatment options, significant modification of task necessary    Comorbidities Affecting Occupational Performance: Presence of comorbidities impacting occupational performance    Modification or Assistance to Complete Evaluation  Max significant modification of tasks or assist is necessary to complete    OT Frequency 2x / week    OT Duration 12 weeks    OT Treatment/Interventions Self-care/ADL training;Therapeutic exercise;Coping strategies training;Therapeutic activities;Manual lymph drainage;Energy conservation;Manual Therapy;DME and/or AE instruction;Compression  bandaging;Patient/family education    Plan CDT to BLE, one leg at a time, RL:E first    OT Home Exercise Plan fit with BLE custom flat knit compression knee highs    Recommended Other Services Lymphedema management has a high burden of care, especially for Patients who have difficulty, or who are unable to reach their distal legs and feet.  reach their feet and distal legs to aapply bandages, compression garments, to bathe , inspect skin and groom nails. In this case because Pt is unable to perform these activities, daily caregiver assistance with the lymphedema self-care home program  between OT visits is essential for achieving clinical success. Without ongoing, daily caregiver assistance during compression bandaging, donning and doffing compression garments, skin care, and MLD , Pt's prognosis is poor. However, with consistent caregiver assistance with lymphedema home program prognosis for improved lymphedema control and reducing infection risk and progression is good.    Consulted and Agree with Plan of Care Patient             Patient will benefit from skilled therapeutic intervention in order to improve the following deficits and impairments:   Body Structure / Function / Physical Skills: ADL, Skin integrity, Endurance, Balance, Mobility, Strength, Edema, Obesity, Pain, Gait, ROM, Decreased knowledge of precautions, Decreased knowledge of use of DME, IADL       Visit Diagnosis: Lymphedema, not elsewhere classified    Problem List Patient Active Problem List   Diagnosis Date Noted   Osteoarthritis 10/04/2019   Spondylodiscitis 04/26/2019   Lymphedema 04/26/2019   Morbid obesity ([place]) 04/26/2019   BMI 45.0-49.9, adult ([place]) 08/20/2018   Controlled type 2 diabetes mellitus without complication, without long-term current use of insulin ([place]) 11/26/2017   GERD (gastroesophageal reflux disease) 11/26/2017   Hyperlipidemia, mixed 11/26/2017   Hypertension, essential 11/26/2017    Chest pain at rest 11/26/2017   Andrey Spearman, MS, OTR/L, [facility] 04/18/21 12:34 PM    [place] MAIN [facility] SERVICES [place], [place], [address] Phone: [phone]   Fax:  [phone]  Name: Nancy Riley MRN: [id number] Date of Birth: 1955/09/07

## 2021-04-24 ENCOUNTER — Other Ambulatory Visit: Payer: Self-pay

## 2021-04-24 ENCOUNTER — Ambulatory Visit: Payer: Medicare PPO | Admitting: Occupational Therapy

## 2021-04-24 DIAGNOSIS — I890 Lymphedema, not elsewhere classified: Secondary | ICD-10-CM

## 2021-04-24 NOTE — Therapy (Signed)
[place] MAIN [facility] SERVICES [address] [place], [place], [address] Phone: [phone]   Fax:  [phone]  Occupational Therapy Treatment  Patient Details  Name: Nancy Riley MRN: [id number] Date of Birth: [birth year] Referring Provider (OT): Eulogio Ditch, NP   Encounter Date: 04/24/2021   OT End of Session - 04/24/21 1111     Visit Number 14    Number of Visits 36    Date for OT Re-Evaluation 06/07/21    OT Start Time 1100    OT Stop Time 1205    OT Time Calculation (min) 65 min    Activity Tolerance Patient tolerated treatment well;No increased pain;Patient limited by pain    Behavior During Therapy [facility] for tasks assessed/performed             Past Medical History:  Diagnosis Date   Diabetes mellitus without complication ([place])    DJD (degenerative joint disease)    GERD (gastroesophageal reflux disease)    Hypercholesteremia    Hypertension    Morbid obesity with BMI of 50.0-59.9, adult ([place])    Neuropathy    Phlegmon    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   Subjective Assessment - 04/24/21 1234     Subjective  [name] presents for OT Rx visit 14/36 to address worsening BLE lymphedema. Pt arrives seated in a transport wc. Pt presents with RLE knee length compression wraps in place. Pt rates bilateral knee pain at 5/10. Pt is in agreement with plan to complete measurements for RLE custom compression garment today.    Pertinent History Spondylodiscitis, BLE lymphedema, HTN, DM, neuropathy, DJD, arthritis, sciatica,  chronic back pain, morbid obesity, (BMI 50-59.9)    Limitations Difficulty walking, impaired  transfers/functional mobility, chronic back DJD and knee  OA pain , chronic leg swelling decreased strength, impaired balance, impaired sensation (neuropathy in feet), increased infection risk, increased fall risk,    Repetition Increases Symptoms    Special Tests + Stemmer bilaterally    Patient Stated Goals get swelling back down and replace worn compression stockings to limit LE progression    Pain Onset Other (comment)   chronic                         OT Treatments/Exercises (OP) - 04/24/21 1236       ADLs   ADL Education Given Yes      Manual Therapy   Manual Therapy Edema management;Manual Lymphatic Drainage (MLD);Compression Bandaging    Manual therapy comments skin care throughout MLD to increase hydration and flexibility    Edema Management anatomical measurements for compression garments    Compression Bandaging RLE gradient compression wrraps using  one 8 and 10 cm wide short stretch wrap, with 2 12 cm wide wraps in gradient pattern over single layer stocinett and 0.4 cm thick rosidal foam.                    OT Education - 04/24/21 1237     Education Details Continued skilled Pt/caregiver education  And LE ADL training throughout  visit for lymphedema self care/ home program, including compression wrapping, compression garment and device wear/care, lymphatic pumping ther ex, simple self-MLD, and skin care. Pt specificaly reminded not to leave gradient compression wraps applied in clinic in place for more than 48 hours, at the most, to limit risk of injury to skin and/ or infection risk.    Person(s) Educated Patient    Methods Explanation;Demonstration    Comprehension Verbalized understanding;Need further instruction;Returned demonstration                 OT Long Term Goals - 04/09/21 1119       OT LONG TERM GOAL #1   Title Pt will demonstrate understanding of lymphedema precautions and signs/ symptoms of cellulitisby identifying 6  items for each using printed Lymphedema Workbook for reference PRN (modified independence) to limit LE progression and infection risk.    Baseline minA    Time 4    Period Days    Status Achieved      OT LONG TERM GOAL #2   Title Pt will require Max assist from caregiver who will be able to apply multilayer, gradient compression wraps using correct techniques after skilled training to reduce limb volume and limity LE progression.    Baseline dependent    Time 4    Period Days    Status Achieved      OT LONG TERM GOAL #3   Title Pt will achieve 10% limb volume reductions  bilaterally from ankle to tibial tuberosity during Intensive Phase CDT to return limbs to typical size and shape ,. reduce infection risk and improve basic andf instrumental ALDs performance.    Time 12    Period Weeks    Status Partially Met   R leg reduction from ankle to knee (A-D) = 0.005% on  04/04/21. Since commencing OT for CDT RLE limb volume, altho reduced slightly, is essentially unchanged on the 9th visit.   Target Date 06/07/21      OT LONG TERM GOAL #4   Title Pt will consistently perform all LE self -care home protocols ( modified simple self MLD, skin care, therapeutic exercise, compression therapies) daily  with max CG assistance to to achieve max edema reduction, to enanble functional improvements,  including fitting preferred street shoes and standard sized clothing, and to achieve increased AROM and reduced falls risk.    Baseline Max A    Time 12    Period Weeks    Status On-going   Pt is unable to reach feet to perform sinmple self MLD and skin care. Flexitouch advanced sequential compression device with proximal to distal sequencing, like MLD, is medically necessary to limit progression.   Target Date 06/07/21      OT LONG TERM GOAL #5   Status Deferred                   Plan - 04/24/21 1230     Clinical Impression Statement Completed RLE anatomical measurements for custom compression  garment knee high.  New garments          during this course of Rx are replacements for original garments fitted in 2021. Those are both worn out and too small. Re-applied multilayer wraps to RLE below trhe knee. Will fit R knee high garment and commence CDT to LLE  ASAP. Cont as per POC.    OT Occupational Profile and History Comprehensive Assessment- Review of records and extensive additional review of physical, cognitive, psychosocial history related  to current functional performance    Occupational performance deficits (Please refer to evaluation for details): ADL's;Work;IADL's;Rest and Sleep;Leisure;Social Participation;Other    Body Structure / Function / Physical Skills ADL;Skin integrity;Endurance;Balance;Mobility;Strength;Edema;Obesity;Pain;Gait;ROM;Decreased knowledge of precautions;Decreased knowledge of use of DME;IADL    Clinical Decision Making Multiple treatment options, significant modification of task necessary    Comorbidities Affecting Occupational Performance: Presence of comorbidities impacting occupational performance    Modification or Assistance to Complete Evaluation  Max significant modification of tasks or assist is necessary to complete    OT Frequency 2x / week    OT Duration 12 weeks    OT Treatment/Interventions Self-care/ADL training;Therapeutic exercise;Coping strategies training;Therapeutic activities;Manual lymph drainage;Energy conservation;Manual Therapy;DME and/or AE instruction;Compression bandaging;Patient/family education    Plan CDT to BLE, one leg at a time, RL:E first    OT Home Exercise Plan fit with BLE custom flat knit compression knee highs    Recommended Other Services Lymphedema management has a high burden of care, especially for Patients who have difficulty, or who are unable to reach their distal legs and feet.  reach their feet and distal legs to aapply bandages, compression garments, to bathe , inspect skin and groom nails. In this case because Pt is  unable to perform these activities, daily caregiver assistance with the lymphedema self-care home program  between OT visits is essential for achieving clinical success. Without ongoing, daily caregiver assistance during compression bandaging, donning and doffing compression garments, skin care, and MLD , Pt's prognosis is poor. However, with consistent caregiver assistance with lymphedema home program prognosis for improved lymphedema control and reducing infection risk and progression is good.    Consulted and Agree with Plan of Care Patient             Patient will benefit from skilled therapeutic intervention in order to improve the following deficits and impairments:   Body Structure / Function / Physical Skills: ADL, Skin integrity, Endurance, Balance, Mobility, Strength, Edema, Obesity, Pain, Gait, ROM, Decreased knowledge of precautions, Decreased knowledge of use of DME, IADL       Visit Diagnosis: Lymphedema, not elsewhere classified    Problem List Patient Active Problem List   Diagnosis Date Noted   Osteoarthritis 10/04/2019   Spondylodiscitis 04/26/2019   Lymphedema 04/26/2019   Morbid obesity ([place]) 04/26/2019   BMI 45.0-49.9, adult ([place]) 08/20/2018   Controlled type 2 diabetes mellitus without complication, without long-term current use of insulin ([place]) 11/26/2017   GERD (gastroesophageal reflux disease) 11/26/2017   Hyperlipidemia, mixed 11/26/2017   Hypertension, essential 11/26/2017   Chest pain at rest 11/26/2017    Andrey Spearman, MS, OTR/L, [facility] 04/24/21 12:56 PM   [place] MAIN [facility] SERVICES [place], [place], [address] Phone: [phone]   Fax:  [phone]  Name: STEPHAIE DARDIS MRN: [id number] Date of Birth: [birth year]

## 2021-04-24 NOTE — Patient Instructions (Signed)

## 2021-04-30 ENCOUNTER — Ambulatory Visit: Payer: Medicare PPO | Admitting: Occupational Therapy

## 2021-04-30 ENCOUNTER — Other Ambulatory Visit: Payer: Self-pay

## 2021-04-30 DIAGNOSIS — I890 Lymphedema, not elsewhere classified: Secondary | ICD-10-CM

## 2021-05-01 NOTE — Patient Instructions (Signed)
Lymphedema Self- Care Instructions   1. EXERCISE: Perform lymphatic pumping there ex at least 2 x a day while wearing your compression wraps or garments. Perform 10 reps of each exercise bilaterally and be sure to perform them in order. Don't skip around!  OMIT PARTIAL SIT UPs.  2. MLD: Perform simple self-manual lymphatic drainage (MLD) at least once a day as directed. Take your time! Breathe! ;-)  3. If you have a Flexitouch advanced "pump" use it 1 time each day on a single limb only. The Flexitouch moves lymphatic fluid out of your affected body part and back to your heart, so DO NOT use the Flexi on 2 legs at a time, and DO NOT ues it on 2 legs on the same day. If you experience any atypical shortness of breath, sudden onset of pain, or feelings of heart arhythmia, or racing, discontinue use of the Flexitouch and report these symptoms to your doctor right away. Also, discontinue Flexi if you have an infection or a fever. It's OK to resume using the device 72 hours AFTER your first dose of oral antibiotic.   4. WRAPS: Compression wraps are to be worn 23 hrs/ 7 days/wk during Intensive Phase of Complete Decongestive Therapy (CDT).Building tolerance may take time and practice, so don't get discouraged. If bandages begin to feel tight during periods of inactivity and/or during the night, try performing your exercises to loosen them. Do not leave short stretch wraps in place for > 23 hours. It is very important that you remove all wraps daily to inspect skin, bathe and perform skin care before reapplying your wraps.  5. Daytime GARMENTS/ HOS DEVICES: During the Self-Management Phase CDT your compression garments are to be worn during waking hours when active. Do NOT sleep in your garments!!  Don daytime garments first thing in the morning. Do not wear your HOS devices all day instead of garments. These will not contain your swelling.  6. PUT YOUR FEET UP! Elevate your feet and legs and feet to the level  of your heart whenever you are sitting down.   7. SKIN: Carefully monitor skin condition and perform impeccable hygiene daily. Bathe skin with mild soap and water and apply low pH lotion (aka Eucerin ) to improve hydration and limit infection risk.

## 2021-05-01 NOTE — Therapy (Signed)
[place] MAIN [facility] SERVICES [address] [place], [place], [address] Phone: [phone]   Fax:  [phone]  Occupational Therapy Treatment  Patient Details  Name: Nancy Riley MRN: [id number] Date of Birth: 1956/03/05 Referring Provider (OT): Eulogio Ditch, NP   Encounter Date: 04/30/2021   OT End of Session - 04/30/21 1105     Visit Number 15    Number of Visits 36    Date for OT Re-Evaluation 06/07/21    OT Start Time 1110    OT Stop Time 1210    OT Time Calculation (min) 60 min    Activity Tolerance Patient tolerated treatment well;No increased pain;Patient limited by pain    Behavior During Therapy [facility] for tasks assessed/performed             Past Medical History:  Diagnosis Date   Diabetes mellitus without complication ([place])    DJD (degenerative joint disease)    GERD (gastroesophageal reflux disease)    Hypercholesteremia    Hypertension    Morbid obesity with BMI of 50.0-59.9, adult ([place])    Neuropathy    Phlegmon    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   Subjective Assessment - 04/30/21 1107     Subjective  Ms Garn presents for OT Rx visit 15/36 to address worsening BLE lymphedema. Pt arrives seated in a transport wc. Pt presents with RLE knee length compression wraps in place. Pt rates bilateral knee pain at 5/10.    Pertinent History Spondylodiscitis, BLE lymphedema, HTN, DM, neuropathy, DJD, arthritis, sciatica,  chronic back pain, morbid obesity, (BMI 50-59.9)    Limitations Difficulty walking, impaired transfers/functional mobility, chronic back DJD and knee  OA pain , chronic leg swelling decreased strength,  impaired balance, impaired sensation (neuropathy in feet), increased infection risk, increased fall risk,    Repetition Increases Symptoms    Special Tests + Stemmer bilaterally    Patient Stated Goals get swelling back down and replace worn compression stockings to limit LE progression    Pain Onset Other (comment)   chronic                         OT Treatments/Exercises (OP) - 05/01/21 0849       ADLs   ADL Education Given Yes      Manual Therapy   Manual Therapy Edema management;Manual Lymphatic Drainage (MLD);Compression Bandaging    Manual therapy comments skin care throughout MLD to increase hydration and flexibility    Compression Bandaging RLE gradient compression wrraps using  one 8 and 10 cm wide short stretch wrap, with 2 12 cm wide wraps in gradient pattern over single layer stocinett and 0.4 cm thick rosidal foam.                    OT Education - 05/01/21 0850     Education Details Continued skilled Pt/caregiver education  And LE ADL training throughout visit for lymphedema self care/ home program, including compression wrapping, compression garment and device wear/care, lymphatic pumping ther ex, simple self-MLD, and skin care. Pt  specificaly reminded not to leave gradient compression wraps applied in clinic in place for more than 48 hours, at the most, to limit risk of injury to skin and/ or infection risk.    Person(s) Educated Patient    Methods Explanation;Demonstration    Comprehension Verbalized understanding;Need further instruction;Returned demonstration                 OT Long Term Goals - 04/30/21 1105       OT LONG TERM GOAL #1   Title Pt will demonstrate understanding of lymphedema precautions and signs/ symptoms of cellulitisby identifying 6 items for each using printed Lymphedema Workbook for reference PRN (modified independence) to limit LE progression and infection risk.    Baseline minA    Time 4    Period Days     Status Achieved      OT LONG TERM GOAL #2   Title Pt will require Max assist from caregiver who will be able to apply multilayer, gradient compression wraps using correct techniques after skilled training to reduce limb volume and limity LE progression.    Baseline dependent    Time 4    Period Days    Status Achieved      OT LONG TERM GOAL #3   Title Pt will achieve 10% limb volume reductions  bilaterally from ankle to tibial tuberosity during Intensive Phase CDT to return limbs to typical size and shape ,. reduce infection risk and improve basic andf instrumental ALDs performance.    Baseline Achieved for RLE w/ 13.86 volume reduction below the knee (ankle-tibial tuberosity A-D). LLE has not been measured yet but reduction is visible. I would estimate > 5% but < 10% thus far.    Time 12    Period Weeks    Status Partially Met   R leg reduction from ankle to knee (A-D) = 0.005% on  04/04/21. Since commencing OT for CDT RLE limb volume, altho reduced slightly, is essentially unchanged on the 9th visit.   Target Date 06/07/21      OT LONG TERM GOAL #4   Title Pt will consistently perform all LE self -care home protocols ( modified simple self MLD, skin care, therapeutic exercise, compression therapies) daily  with max CG assistance to to achieve max edema reduction, to enanble functional improvements,  including fitting preferred street shoes and standard sized clothing, and to achieve increased AROM and reduced falls risk.    Baseline Max A    Time 12    Period Weeks    Status On-going   Pt is unable to reach feet to perform sinmple self MLD and skin care. Flexitouch advanced sequential compression device with proximal to distal sequencing, like MLD, is medically necessary to limit progression.   Target Date 06/07/21      OT LONG TERM GOAL #5   Status Deferred                   Plan - 04/30/21 0844     Clinical Impression Statement Provided RLE/RLQ MLD utilizing functional LN  in seated position w leg elevated  . Provided skin care throughout MLD with low ph castor oil to increase skin mobility and hydration. Reapplied multilayer gradient compression wraps as established after trying to fit an existing custom knee high. Unfortunately this garment is too small. Pt was notified by vendor of out of pocket cost for repalcement compression garments x 2. I believe the quote they gave was a total for both  garments instead of cost each, which the patient is unable to afford. OT emailed vendor and requested they contact patient with calrification of prices. Cont as per POC.    OT Occupational Profile and History Comprehensive Assessment- Review of records and extensive additional review of physical, cognitive, psychosocial history related to current functional performance    Occupational performance deficits (Please refer to evaluation for details): ADL's;Work;IADL's;Rest and Sleep;Leisure;Social Participation;Other    Body Structure / Function / Physical Skills ADL;Skin integrity;Endurance;Balance;Mobility;Strength;Edema;Obesity;Pain;Gait;ROM;Decreased knowledge of precautions;Decreased knowledge of use of DME;IADL    Clinical Decision Making Multiple treatment options, significant modification of task necessary    Comorbidities Affecting Occupational Performance: Presence of comorbidities impacting occupational performance    Modification or Assistance to Complete Evaluation  Max significant modification of tasks or assist is necessary to complete    OT Frequency 2x / week    OT Duration 12 weeks    OT Treatment/Interventions Self-care/ADL training;Therapeutic exercise;Coping strategies training;Therapeutic activities;Manual lymph drainage;Energy conservation;Manual Therapy;DME and/or AE instruction;Compression bandaging;Patient/family education    Plan CDT to BLE, one leg at a time, RL:E first    OT Home Exercise Plan fit with BLE custom flat knit compression knee highs     Recommended Other Services Lymphedema management has a high burden of care, especially for Patients who have difficulty, or who are unable to reach their distal legs and feet.  reach their feet and distal legs to aapply bandages, compression garments, to bathe , inspect skin and groom nails. In this case because Pt is unable to perform these activities, daily caregiver assistance with the lymphedema self-care home program  between OT visits is essential for achieving clinical success. Without ongoing, daily caregiver assistance during compression bandaging, donning and doffing compression garments, skin care, and MLD , Pt's prognosis is poor. However, with consistent caregiver assistance with lymphedema home program prognosis for improved lymphedema control and reducing infection risk and progression is good.    Consulted and Agree with Plan of Care Patient             Patient will benefit from skilled therapeutic intervention in order to improve the following deficits and impairments:   Body Structure / Function / Physical Skills: ADL, Skin integrity, Endurance, Balance, Mobility, Strength, Edema, Obesity, Pain, Gait, ROM, Decreased knowledge of precautions, Decreased knowledge of use of DME, IADL       Visit Diagnosis: Lymphedema, not elsewhere classified    Problem List Patient Active Problem List   Diagnosis Date Noted   Osteoarthritis 10/04/2019   Spondylodiscitis 04/26/2019   Lymphedema 04/26/2019   Morbid obesity ([place]) 04/26/2019   BMI 45.0-49.9, adult ([place]) 08/20/2018   Controlled type 2 diabetes mellitus without complication, without long-term current use of insulin ([place]) 11/26/2017   GERD (gastroesophageal reflux disease) 11/26/2017   Hyperlipidemia, mixed 11/26/2017   Hypertension, essential 11/26/2017   Chest pain at rest 11/26/2017   Andrey Spearman, MS, OTR/L, [facility] 05/01/21 8:51 AM   [place] MAIN [facility] SERVICES [place], [place], [address] Phone: [phone]   Fax:  [phone]  Name: TRANIYA PRICHETT MRN: [id number] Date of Birth: Jul 17, 1955

## 2021-05-03 ENCOUNTER — Other Ambulatory Visit: Payer: Self-pay

## 2021-05-03 ENCOUNTER — Ambulatory Visit: Payer: Medicare PPO | Attending: Nurse Practitioner | Admitting: Occupational Therapy

## 2021-05-03 DIAGNOSIS — I890 Lymphedema, not elsewhere classified: Secondary | ICD-10-CM | POA: Diagnosis present

## 2021-05-03 NOTE — Therapy (Signed)
[place] MAIN [facility] SERVICES [address] [place], [place], [address] Phone: [phone]   Fax:  [phone]  Occupational Therapy Treatment  Patient Details  Name: Nancy Riley MRN: [id number] Date of Birth: [birth year] Referring Provider (OT): Eulogio Ditch, NP   Encounter Date: 05/03/2021   OT End of Session - 05/03/21 1013     Visit Number 16    Number of Visits 36    Date for OT Re-Evaluation 06/07/21    OT Start Time 1005    OT Stop Time 1105    OT Time Calculation (min) 60 min    Activity Tolerance Patient tolerated treatment well;No increased pain;Patient limited by pain    Behavior During Therapy [facility] for tasks assessed/performed             Past Medical History:  Diagnosis Date   Diabetes mellitus without complication ([place])    DJD (degenerative joint disease)    GERD (gastroesophageal reflux disease)    Hypercholesteremia    Hypertension    Morbid obesity with BMI of 50.0-59.9, adult ([place])    Neuropathy    Phlegmon    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   Subjective Assessment - 05/03/21 1014     Subjective  Ms Clements presents for OT Rx visit 16/36 to address worsening BLE lymphedema. Pt arrives seated in a transport wc. Pt presents with RLE knee length compression wraps in place. Pt rates bilateral knee pain at 7/10.Pt reports DME vendor called with cost of compression garments, but at present she is unable to afford them. OT and Pt discussed options throughout session.    Pertinent History Spondylodiscitis, BLE lymphedema, HTN, DM, neuropathy, DJD, arthritis, sciatica,  chronic back pain, morbid obesity, (BMI 50-59.9)     Limitations Difficulty walking, impaired transfers/functional mobility, chronic back DJD and knee  OA pain , chronic leg swelling decreased strength, impaired balance, impaired sensation (neuropathy in feet), increased infection risk, increased fall risk,    Repetition Increases Symptoms    Special Tests + Stemmer bilaterally    Patient Stated Goals get swelling back down and replace worn compression stockings to limit LE progression    Pain Onset Other (comment)   chronic                                 OT Education - 05/03/21 1302     Education Details Continued skilled Pt/caregiver education  And LE ADL training throughout visit for lymphedema self care/ home program, including compression wrapping, compression garment and device wear/care, lymphatic pumping ther ex, simple self-MLD, and skin care. Pt specificaly reminded not to leave gradient compression wraps applied in clinic in place for more than 48 hours, at the most, to limit risk of injury to skin and/ or infection risk.    Person(s) Educated Patient    Methods Explanation;Demonstration    Comprehension Verbalized understanding;Need further instruction;Returned demonstration                 OT Long Term Goals - 04/30/21 1105  OT LONG TERM GOAL #1   Title Pt will demonstrate understanding of lymphedema precautions and signs/ symptoms of cellulitisby identifying 6 items for each using printed Lymphedema Workbook for reference PRN (modified independence) to limit LE progression and infection risk.    Baseline minA    Time 4    Period Days    Status Achieved      OT LONG TERM GOAL #2   Title Pt will require Max assist from caregiver who will be able to apply multilayer, gradient compression wraps using correct techniques after skilled training to reduce limb volume and limity LE progression.    Baseline dependent    Time 4    Period Days    Status Achieved      OT LONG TERM GOAL #3    Title Pt will achieve 10% limb volume reductions  bilaterally from ankle to tibial tuberosity during Intensive Phase CDT to return limbs to typical size and shape ,. reduce infection risk and improve basic andf instrumental ALDs performance.    Baseline Achieved for RLE w/ 13.86 volume reduction below the knee (ankle-tibial tuberosity A-D). LLE has not been measured yet but reduction is visible. I would estimate > 5% but < 10% thus far.    Time 12    Period Weeks    Status Partially Met   R leg reduction from ankle to knee (A-D) = 0.005% on  04/04/21. Since commencing OT for CDT RLE limb volume, altho reduced slightly, is essentially unchanged on the 9th visit.   Target Date 06/07/21      OT LONG TERM GOAL #4   Title Pt will consistently perform all LE self -care home protocols ( modified simple self MLD, skin care, therapeutic exercise, compression therapies) daily  with max CG assistance to to achieve max edema reduction, to enanble functional improvements,  including fitting preferred street shoes and standard sized clothing, and to achieve increased AROM and reduced falls risk.    Baseline Max A    Time 12    Period Weeks    Status On-going   Pt is unable to reach feet to perform sinmple self MLD and skin care. Flexitouch advanced sequential compression device with proximal to distal sequencing, like MLD, is medically necessary to limit progression.   Target Date 06/07/21      OT LONG TERM GOAL #5   Status Deferred                   Plan - 05/03/21 1301     Clinical Impression Statement Discussed plan to replace existing, worn out and poorly fitting ( too small) custom compression stockingsl. Provided Mod A to call DME vendor to discuss payment plan options and discount. Pt now understands that she can purchase one garment at a time and pay for them based on the financial arrangement that works best for her. Provided biz card with contact info for billing person, Gerald Stabs, we spoke  with today. Pt understands it is not optimal, in terms of her falls risk, to wrap bth legs, and we wont move on to LLE Rx until RLE garment is fitted. Cobntinued MLD, skin care and compression wrapping to RLE as established withut increased pain. Cont as per POC.    OT Occupational Profile and History Comprehensive Assessment- Review of records and extensive additional review of physical, cognitive, psychosocial history related to current functional performance    Occupational performance deficits (Please refer to evaluation for details): ADL's;Work;IADL's;Rest and Sleep;Leisure;Social Participation;Other  Body Structure / Function / Physical Skills ADL;Skin integrity;Endurance;Balance;Mobility;Strength;Edema;Obesity;Pain;Gait;ROM;Decreased knowledge of precautions;Decreased knowledge of use of DME;IADL    Clinical Decision Making Multiple treatment options, significant modification of task necessary    Comorbidities Affecting Occupational Performance: Presence of comorbidities impacting occupational performance    Modification or Assistance to Complete Evaluation  Max significant modification of tasks or assist is necessary to complete    OT Frequency 2x / week    OT Duration 12 weeks    OT Treatment/Interventions Self-care/ADL training;Therapeutic exercise;Coping strategies training;Therapeutic activities;Manual lymph drainage;Energy conservation;Manual Therapy;DME and/or AE instruction;Compression bandaging;Patient/family education    Plan CDT to BLE, one leg at a time, RL:E first    OT Home Exercise Plan fit with BLE custom flat knit compression knee highs    Recommended Other Services Lymphedema management has a high burden of care, especially for Patients who have difficulty, or who are unable to reach their distal legs and feet.  reach their feet and distal legs to aapply bandages, compression garments, to bathe , inspect skin and groom nails. In this case because Pt is unable to perform these  activities, daily caregiver assistance with the lymphedema self-care home program  between OT visits is essential for achieving clinical success. Without ongoing, daily caregiver assistance during compression bandaging, donning and doffing compression garments, skin care, and MLD , Pt's prognosis is poor. However, with consistent caregiver assistance with lymphedema home program prognosis for improved lymphedema control and reducing infection risk and progression is good.    Consulted and Agree with Plan of Care Patient             Patient will benefit from skilled therapeutic intervention in order to improve the following deficits and impairments:   Body Structure / Function / Physical Skills: ADL, Skin integrity, Endurance, Balance, Mobility, Strength, Edema, Obesity, Pain, Gait, ROM, Decreased knowledge of precautions, Decreased knowledge of use of DME, IADL       Visit Diagnosis: Lymphedema, not elsewhere classified    Problem List Patient Active Problem List   Diagnosis Date Noted   Osteoarthritis 10/04/2019   Spondylodiscitis 04/26/2019   Lymphedema 04/26/2019   Morbid obesity ([place]) 04/26/2019   BMI 45.0-49.9, adult ([place]) 08/20/2018   Controlled type 2 diabetes mellitus without complication, without long-term current use of insulin ([place]) 11/26/2017   GERD (gastroesophageal reflux disease) 11/26/2017   Hyperlipidemia, mixed 11/26/2017   Hypertension, essential 11/26/2017   Chest pain at rest 11/26/2017   Andrey Spearman, MS, OTR/L, [facility] 05/03/21 1:02 PM   [place] MAIN [facility] SERVICES [place], [place], [address] Phone: [phone]   Fax:  [phone]  Name: Nancy Riley MRN: [id number] Date of Birth: Mar 03, 1956

## 2021-05-07 ENCOUNTER — Ambulatory Visit: Payer: Medicare PPO | Admitting: Occupational Therapy

## 2021-05-07 ENCOUNTER — Other Ambulatory Visit: Payer: Self-pay

## 2021-05-07 DIAGNOSIS — I890 Lymphedema, not elsewhere classified: Secondary | ICD-10-CM | POA: Diagnosis not present

## 2021-05-07 NOTE — Therapy (Signed)
[place] [facility] SERVICES [address] [place], [place], [address] Phone: [phone]   Fax:  [phone]  Occupational Therapy Treatment  Nancy Riley Details  Name: Nancy Riley MRN: [id number] Date of Birth: May 19, 1956 Referring Provider (OT): Eulogio Ditch, NP   Encounter Date: 05/07/2021   OT End of Session - 05/07/21 1256     Visit Number 17    Number of Visits 36    Date for OT Re-Evaluation 06/07/21    OT Start Time 1105    OT Stop Time 1205    OT Time Calculation (min) 60 min    Activity Tolerance Nancy Riley tolerated treatment well;No increased pain;Nancy Riley limited by pain    Behavior During Therapy [facility] for tasks assessed/performed             Past Medical History:  Diagnosis Date   Diabetes mellitus without complication ([place])    DJD (degenerative joint disease)    GERD (gastroesophageal reflux disease)    Hypercholesteremia    Hypertension    Morbid obesity with BMI of 50.0-59.9, adult ([place])    Neuropathy    Phlegmon    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   Subjective Assessment - 05/07/21 1304     Subjective  Nancy Riley presents for OT Rx visit 17/36 to address worsening BLE lymphedema. Nancy Riley arrives seated in a transport wc. Nancy Riley presents with RLE knee length compression wraps in place. Nancy Riley rates bilateral knee pain at 7/10.    Pertinent History Spondylodiscitis, BLE lymphedema, HTN, DM, neuropathy, DJD, arthritis, sciatica,  chronic back pain, morbid obesity, (BMI 50-59.9)    Limitations Difficulty walking, impaired transfers/functional mobility, chronic back DJD and knee  OA pain , chronic leg swelling decreased strength,  impaired balance, impaired sensation (neuropathy in feet), increased infection risk, increased fall risk,    Repetition Increases Symptoms    Special Tests + Stemmer bilaterally    Nancy Riley Stated Goals get swelling back down and replace worn compression stockings to limit LE progression    Pain Onset Other (comment)   chronic                         OT Treatments/Exercises (OP) - 05/07/21 1304       ADLs   ADL Education Given Yes      Manual Therapy   Manual Therapy Edema management;Manual Lymphatic Drainage (MLD);Compression Bandaging    Manual therapy comments skin care throughout MLD to increase hydration and flexibility    Manual Lymphatic Drainage (MLD) MLD to RLE/RLQ with simultaneous skin care to increase skin flexibility and to increase hydration. Utilized modified sjort neck sequence  omitting lateral neck strokes and performing J strokes to clavicular regions only. Nancy Riley instructed in diaphragmatic breathing during MLD. Utilized functional inguinal LN and deep adominal pathways working from proximal to distal through functional inguinal LN/. No c/o pain    throughout Rx.    Compression Bandaging RLE gradient compression wrraps using  one 8 and 10 cm wide short stretch wrap, with 2 12 cm wide wraps in gradient pattern over single layer  stocinett and 0.4 cm thick rosidal foam.                    OT Education - 05/07/21 1305     Education Details Nancy Riley edu focused on differences between custom  flat knit and off-the-shelf circular elastic compression garments.  Educated Nancy Riley re indications for both and pros and cons of each, and rational for therapist's current recommendations  Educated Nancy Riley re estimated costs, measuring and fitting process ,DME vendor's involvement and access to insurance benefits.    Person(s) Educated Nancy Riley    Methods Explanation;Demonstration    Comprehension Verbalized understanding;Need further instruction;Returned demonstration                  OT Long Term Goals - 04/30/21 1105       OT LONG TERM GOAL #1   Title Nancy Riley will demonstrate understanding of lymphedema precautions and signs/ symptoms of cellulitisby identifying 6 items for each using printed Lymphedema Workbook for reference PRN (modified independence) to limit LE progression and infection risk.    Baseline minA    Time 4    Period Days    Status Achieved      OT LONG TERM GOAL #2   Title Nancy Riley will require Max assist from caregiver who will be able to apply multilayer, gradient compression wraps using correct techniques after skilled training to reduce limb volume and limity LE progression.    Baseline dependent    Time 4    Period Days    Status Achieved      OT LONG TERM GOAL #3   Title Nancy Riley will achieve 10% limb volume reductions  bilaterally from ankle to tibial tuberosity during Intensive Phase CDT to return limbs to typical size and shape ,. reduce infection risk and improve basic andf instrumental ALDs performance.    Baseline Achieved for RLE w/ 13.86 volume reduction below the knee (ankle-tibial tuberosity A-D). LLE has not been measured yet but reduction is visible. I would estimate > 5% but < 10% thus far.    Time 12    Period Weeks    Status Partially Met   R leg reduction from ankle to knee (A-D) = 0.005% on  04/04/21. Since commencing OT for CDT RLE limb volume, altho reduced slightly, is essentially unchanged on the 9th visit.   Target Date 06/07/21      OT LONG TERM GOAL #4   Title Nancy Riley will consistently perform all LE self -care home protocols ( modified simple self MLD, skin care, therapeutic exercise, compression therapies) daily  with max CG assistance to to achieve max edema reduction, to enanble functional improvements,  including fitting preferred street shoes and standard sized clothing, and to achieve increased AROM and reduced falls risk.    Baseline Max A    Time 12    Period Weeks    Status On-going   Nancy Riley is unable to reach feet to  perform sinmple self MLD and skin care. Flexitouch advanced sequential compression device with proximal to distal sequencing, like MLD, is medically necessary to limit progression.   Target Date 06/07/21      OT LONG TERM GOAL #5   Status Deferred                   Plan - 05/07/21 1256     Clinical Impression Statement Nancy Riley edu throughout MLD again today to clarify properties of flat vs circular knit compression garments, custom vs off-the-shelf, indications and  contraindications,  fit and containment differences, and clinical decision making process for ensuring Nancy Riley is fit with optimal garments/ devices to control chronmic leg swelling over time   to limit progression.This is a very complex process and was difficult to explain to family members over the weekend when she asked for financial assistance to replace her worn out garments. Hopefully our discussion educated and clarified these issues today. Continued MLD, skin care and compression wrapping to RLE as established withut increased pain. Cont as per POC.    OT Occupational Profile and History Comprehensive Assessment- Review of records and extensive additional review of physical, cognitive, psychosocial history related to current functional performance    Occupational performance deficits (Please refer to evaluation for details): ADL's;Work;IADL's;Rest and Sleep;Leisure;Social Participation;Other    Body Structure / Function / Physical Skills ADL;Skin integrity;Endurance;Balance;Mobility;Strength;Edema;Obesity;Pain;Gait;ROM;Decreased knowledge of precautions;Decreased knowledge of use of DME;IADL    Clinical Decision Making Multiple treatment options, significant modification of task necessary    Comorbidities Affecting Occupational Performance: Presence of comorbidities impacting occupational performance    Modification or Assistance to Complete Evaluation  Max significant modification of tasks or assist is necessary to complete    OT  Frequency 2x / week    OT Duration 12 weeks    OT Treatment/Interventions Self-care/ADL training;Therapeutic exercise;Coping strategies training;Therapeutic activities;Manual lymph drainage;Energy conservation;Manual Therapy;DME and/or AE instruction;Compression bandaging;Nancy Riley/family education    Plan CDT to BLE, one leg at a time, RL:E first    OT Home Exercise Plan fit with BLE custom flat knit compression knee highs    Recommended Other Services Lymphedema management has a high burden of care, especially for Patients who have difficulty, or who are unable to reach their distal legs and feet.  reach their feet and distal legs to aapply bandages, compression garments, to bathe , inspect skin and groom nails. In this case because Nancy Riley is unable to perform these activities, daily caregiver assistance with the lymphedema self-care home program  between OT visits is essential for achieving clinical success. Without ongoing, daily caregiver assistance during compression bandaging, donning and doffing compression garments, skin care, and MLD , Nancy Riley's prognosis is poor. However, with consistent caregiver assistance with lymphedema home program prognosis for improved lymphedema control and reducing infection risk and progression is good.    Consulted and Agree with Plan of Care Nancy Riley             Nancy Riley will benefit from skilled therapeutic intervention in order to improve the following deficits and impairments:   Body Structure / Function / Physical Skills: ADL, Skin integrity, Endurance, Balance, Mobility, Strength, Edema, Obesity, Pain, Gait, ROM, Decreased knowledge of precautions, Decreased knowledge of use of DME, IADL       Visit Diagnosis: Lymphedema, not elsewhere classified    Problem List Nancy Riley Active Problem List   Diagnosis Date Noted   Osteoarthritis 10/04/2019   Spondylodiscitis 04/26/2019   Lymphedema 04/26/2019   Morbid obesity ([place]) 04/26/2019   BMI 45.0-49.9, adult  ([place]) 08/20/2018   Controlled type 2 diabetes mellitus without complication, without long-term current use of insulin ([place]) 11/26/2017   GERD (gastroesophageal reflux disease) 11/26/2017   Hyperlipidemia, mixed 11/26/2017   Hypertension, essential 11/26/2017   Chest pain at rest 11/26/2017    Andrey Spearman, Nancy, OTR/L, [facility] 05/07/21 1:06 PM  [place] MAIN [facility] SERVICES [place], [place], [address] Phone: [phone]   Fax:  [phone]  Name: SURIYA KOVARIK MRN: [id number] Date of Birth: February 19, 1956

## 2021-05-07 NOTE — Patient Instructions (Signed)

## 2021-05-09 ENCOUNTER — Other Ambulatory Visit: Payer: Self-pay

## 2021-05-09 ENCOUNTER — Ambulatory Visit: Payer: Medicare PPO | Admitting: Occupational Therapy

## 2021-05-09 DIAGNOSIS — I890 Lymphedema, not elsewhere classified: Secondary | ICD-10-CM | POA: Diagnosis not present

## 2021-05-09 NOTE — Patient Instructions (Signed)

## 2021-05-09 NOTE — Therapy (Signed)
[place] MAIN [facility] SERVICES [address] [place], [place], [address] Phone: [phone]   Fax:  [phone]  Occupational Therapy Treatment  Patient Details  Name: Nancy Riley MRN: [id number] Date of Birth: August 04, 1955 Referring Provider (OT): Eulogio Ditch, NP   Encounter Date: 05/09/2021   OT End of Session - 05/09/21 1116     Visit Number 18    Number of Visits 36    Date for OT Re-Evaluation 06/07/21    OT Start Time 1113    OT Stop Time 1215    OT Time Calculation (min) 62 min    Activity Tolerance Patient tolerated treatment well;No increased pain;Patient limited by pain    Behavior During Therapy [facility] for tasks assessed/performed             Past Medical History:  Diagnosis Date   Diabetes mellitus without complication ([place])    DJD (degenerative joint disease)    GERD (gastroesophageal reflux disease)    Hypercholesteremia    Hypertension    Morbid obesity with BMI of 50.0-59.9, adult ([place])    Neuropathy    Phlegmon    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   Subjective Assessment - 05/09/21 1117     Subjective  Nancy Riley presents for OT Rx visit 18/36 to address worsening BLE lymphedema. Pt arrives seated in a transport wc. Pt presents with RLE knee length compression wraps in place. Pt rates bilateral knee pain at 6/10. Pt reports she was able to gather money from family members to be able to purchase 1 of 2 stockings.    Pertinent History Spondylodiscitis, BLE lymphedema, HTN, DM, neuropathy, DJD, arthritis, sciatica,  chronic back pain, morbid obesity, (BMI 50-59.9)    Limitations Difficulty walking, impaired  transfers/functional mobility, chronic back DJD and knee  OA pain , chronic leg swelling decreased strength, impaired balance, impaired sensation (neuropathy in feet), increased infection risk, increased fall risk,    Repetition Increases Symptoms    Special Tests + Stemmer bilaterally    Patient Stated Goals get swelling back down and replace worn compression stockings to limit LE progression    Currently in Pain? Yes    Pain Location Knee    Pain Orientation Right;Left    Pain Type Chronic pain    Pain Onset Other (comment)   chronic   Pain Location Back    Pain Type Chronic pain                          OT Treatments/Exercises (OP) - 05/09/21 1121       ADLs   ADL Education Given Yes      Manual Therapy   Manual Therapy Edema management    Manual therapy comments skin care throughout MLD to increase hydration and flexibility    Manual Lymphatic Drainage (MLD) MLD to RLE/RLQ with simultaneous skin care to increase skin flexibility and to increase hydration. Utilized modified sjort neck sequence  omitting lateral neck strokes and performing J strokes to clavicular regions only. Pt instructed in diaphragmatic breathing during MLD. Utilized functional  inguinal LN and deep adominal pathways working from proximal to distal through functional inguinal LN/. No c/o pain    throughout Rx.    Compression Bandaging RLE gradient compression wrraps using  one 8 and 10 cm wide short stretch wrap, with 2 12 cm wide wraps in gradient pattern over single layer stocinett and 0.4 cm thick rosidal foam.                    OT Education - 05/09/21 1122     Education Details Continued skilled Pt/caregiver education  And LE ADL training throughout visit for lymphedema self care/ home program, including compression wrapping, compression garment and device wear/care, lymphatic pumping ther ex, simple self-MLD, and skin care. Pt specificaly reminded not to leave gradient compression  wraps applied in clinic in place for more than 48 hours, at the most, to limit risk of injury to skin and/ or infection risk.    Person(s) Educated Patient    Methods Explanation;Demonstration    Comprehension Verbalized understanding;Need further instruction;Returned demonstration                 OT Long Term Goals - 04/30/21 1105       OT LONG TERM GOAL #1   Title Pt will demonstrate understanding of lymphedema precautions and signs/ symptoms of cellulitisby identifying 6 items for each using printed Lymphedema Workbook for reference PRN (modified independence) to limit LE progression and infection risk.    Baseline minA    Time 4    Period Days    Status Achieved      OT LONG TERM GOAL #2   Title Pt will require Max assist from caregiver who will be able to apply multilayer, gradient compression wraps using correct techniques after skilled training to reduce limb volume and limity LE progression.    Baseline dependent    Time 4    Period Days    Status Achieved      OT LONG TERM GOAL #3   Title Pt will achieve 10% limb volume reductions  bilaterally from ankle to tibial tuberosity during Intensive Phase CDT to return limbs to typical size and shape ,. reduce infection risk and improve basic andf instrumental ALDs performance.    Baseline Achieved for RLE w/ 13.86 volume reduction below the knee (ankle-tibial tuberosity A-D). LLE has not been measured yet but reduction is visible. I would estimate > 5% but < 10% thus far.    Time 12    Period Weeks    Status Partially Met   R leg reduction from ankle to knee (A-D) = 0.005% on  04/04/21. Since commencing OT for CDT RLE limb volume, altho reduced slightly, is essentially unchanged on the 9th visit.   Target Date 06/07/21      OT LONG TERM GOAL #4   Title Pt will consistently perform all LE self -care home protocols ( modified simple self MLD, skin care, therapeutic exercise, compression therapies) daily  with max CG assistance  to to achieve max edema reduction, to enanble functional improvements,  including fitting preferred street shoes and standard sized clothing, and to achieve increased AROM and reduced falls risk.    Baseline Max A    Time 12    Period Weeks    Status On-going   Pt is unable to reach feet to perform sinmple self MLD and skin care. Flexitouch advanced sequential compression device with proximal to distal sequencing, like MLD, is medically necessary to limit progression.  Target Date 06/07/21      OT LONG TERM GOAL #5   Status Deferred                   Plan - 05/09/21 1256     Clinical Impression Statement Provided RLE/RLQ MLD utilizing functional LN in seated position w leg elevated  . Provided skin care throughout MLD with low ph castor oil to increase skin mobility and hydration. Reapplied multilayer gradient compression wraps as established. Good tolerance for manual therapy without pain. Cont as per POC. Fit RLE compression garment ASAP.    OT Occupational Profile and History Comprehensive Assessment- Review of records and extensive additional review of physical, cognitive, psychosocial history related to current functional performance    Occupational performance deficits (Please refer to evaluation for details): ADL's;Work;IADL's;Rest and Sleep;Leisure;Social Participation;Other    Body Structure / Function / Physical Skills ADL;Skin integrity;Endurance;Balance;Mobility;Strength;Edema;Obesity;Pain;Gait;ROM;Decreased knowledge of precautions;Decreased knowledge of use of DME;IADL    Clinical Decision Making Multiple treatment options, significant modification of task necessary    Comorbidities Affecting Occupational Performance: Presence of comorbidities impacting occupational performance    Modification or Assistance to Complete Evaluation  Max significant modification of tasks or assist is necessary to complete    OT Frequency 2x / week    OT Duration 12 weeks    OT  Treatment/Interventions Self-care/ADL training;Therapeutic exercise;Coping strategies training;Therapeutic activities;Manual lymph drainage;Energy conservation;Manual Therapy;DME and/or AE instruction;Compression bandaging;Patient/family education    Plan CDT to BLE, one leg at a time, RL:E first    OT Home Exercise Plan fit with BLE custom flat knit compression knee highs    Recommended Other Services Lymphedema management has a high burden of care, especially for Patients who have difficulty, or who are unable to reach their distal legs and feet.  reach their feet and distal legs to aapply bandages, compression garments, to bathe , inspect skin and groom nails. In this case because Pt is unable to perform these activities, daily caregiver assistance with the lymphedema self-care home program  between OT visits is essential for achieving clinical success. Without ongoing, daily caregiver assistance during compression bandaging, donning and doffing compression garments, skin care, and MLD , Pt's prognosis is poor. However, with consistent caregiver assistance with lymphedema home program prognosis for improved lymphedema control and reducing infection risk and progression is good.    Consulted and Agree with Plan of Care Patient             Patient will benefit from skilled therapeutic intervention in order to improve the following deficits and impairments:   Body Structure / Function / Physical Skills: ADL, Skin integrity, Endurance, Balance, Mobility, Strength, Edema, Obesity, Pain, Gait, ROM, Decreased knowledge of precautions, Decreased knowledge of use of DME, IADL       Visit Diagnosis: Lymphedema, not elsewhere classified    Problem List Patient Active Problem List   Diagnosis Date Noted   Osteoarthritis 10/04/2019   Spondylodiscitis 04/26/2019   Lymphedema 04/26/2019   Morbid obesity ([place]) 04/26/2019   BMI 45.0-49.9, adult ([place]) 08/20/2018   Controlled type 2 diabetes mellitus  without complication, without long-term current use of insulin ([place]) 11/26/2017   GERD (gastroesophageal reflux disease) 11/26/2017   Hyperlipidemia, mixed 11/26/2017   Hypertension, essential 11/26/2017   Chest pain at rest 11/26/2017    Andrey Spearman, Nancy, OTR/L, [facility] 05/09/21 12:59 PM   [place] MAIN [facility] SERVICES [place], [place], [address] Phone: [phone]   Fax:  [phone]  Name: Nancy Riley MRN: [id number] Date of Birth: 09-Aug-1955

## 2021-05-14 ENCOUNTER — Ambulatory Visit: Payer: Medicare PPO | Admitting: Occupational Therapy

## 2021-05-14 ENCOUNTER — Other Ambulatory Visit: Payer: Self-pay

## 2021-05-14 DIAGNOSIS — I890 Lymphedema, not elsewhere classified: Secondary | ICD-10-CM | POA: Diagnosis not present

## 2021-05-14 NOTE — Patient Instructions (Signed)

## 2021-05-14 NOTE — Therapy (Signed)
[place] MAIN [facility] SERVICES [address] [place], [place], [address] Phone: [phone]   Fax:  [phone]  Occupational Therapy Treatment  Patient Details  Name: Nancy Riley MRN: [id number] Date of Birth: 08/15/1955 Referring Provider (OT): Eulogio Ditch, NP   Encounter Date: 05/14/2021   OT End of Session - 05/14/21 1110     Visit Number 19    Number of Visits 36    Date for OT Re-Evaluation 06/07/21    OT Start Time 1103    OT Stop Time 1210    OT Time Calculation (min) 67 min    Activity Tolerance Patient tolerated treatment well;No increased pain;Patient limited by pain    Behavior During Therapy [facility] for tasks assessed/performed             Past Medical History:  Diagnosis Date   Diabetes mellitus without complication ([place])    DJD (degenerative joint disease)    GERD (gastroesophageal reflux disease)    Hypercholesteremia    Hypertension    Morbid obesity with BMI of 50.0-59.9, adult ([place])    Neuropathy    Phlegmon    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   Subjective Assessment - 05/14/21 1112     Subjective  Nancy Riley presents for OT Rx visit 19/36 to address worsening BLE lymphedema. Pt arrives seated in a transport wc with  RLE knee length compression wraps in place. Pt rates bilateral knee pain at 4/10. Pt has no other new complaints.    Pertinent History Spondylodiscitis, BLE lymphedema, HTN, DM, neuropathy, DJD, arthritis, sciatica,  chronic back pain, morbid obesity, (BMI 50-59.9)    Limitations Difficulty walking, impaired transfers/functional mobility, chronic back DJD and knee  OA pain , chronic leg swelling  decreased strength, impaired balance, impaired sensation (neuropathy in feet), increased infection risk, increased fall risk,    Repetition Increases Symptoms    Special Tests + Stemmer bilaterally    Patient Stated Goals get swelling back down and replace worn compression stockings to limit LE progression    Pain Onset Other (comment)   chronic                         OT Treatments/Exercises (OP) - 05/14/21 1238       ADLs   ADL Education Given Yes      Manual Therapy   Manual Therapy Edema management    Manual therapy comments skin care throughout MLD to increase hydration and flexibility    Manual Lymphatic Drainage (MLD) MLD to RLE/RLQ with simultaneous skin care to increase skin flexibility and to increase hydration. Utilized modified sjort neck sequence  omitting lateral neck strokes and performing J strokes to clavicular regions only. Pt instructed in diaphragmatic breathing during MLD. Utilized functional inguinal LN and deep adominal pathways working from proximal to distal through functional inguinal LN/. No c/o pain    throughout Rx.    Compression Bandaging RLE gradient compression wrraps using  one 8 and 10 cm wide short stretch wrap, with 2 12 cm wide wraps in gradient pattern over single  layer stocinett and 0.4 cm thick rosidal foam.                    OT Education - 05/14/21 1239     Education Details Continued skilled Pt/caregiver education  And LE ADL training throughout visit for lymphedema self care/ home program, including compression wrapping, compression garment and device wear/care, lymphatic pumping ther ex, simple self-MLD, and skin care. Pt specificaly reminded not to leave gradient compression wraps applied in clinic in place for more than 48 hours, at the most, to limit risk of injury to skin and/ or infection risk.    Person(s) Educated Patient    Methods Explanation;Demonstration    Comprehension Verbalized understanding;Need further  instruction;Returned demonstration                 OT Long Term Goals - 04/30/21 1105       OT LONG TERM GOAL #1   Title Pt will demonstrate understanding of lymphedema precautions and signs/ symptoms of cellulitisby identifying 6 items for each using printed Lymphedema Workbook for reference PRN (modified independence) to limit LE progression and infection risk.    Baseline minA    Time 4    Period Days    Status Achieved      OT LONG TERM GOAL #2   Title Pt will require Max assist from caregiver who will be able to apply multilayer, gradient compression wraps using correct techniques after skilled training to reduce limb volume and limity LE progression.    Baseline dependent    Time 4    Period Days    Status Achieved      OT LONG TERM GOAL #3   Title Pt will achieve 10% limb volume reductions  bilaterally from ankle to tibial tuberosity during Intensive Phase CDT to return limbs to typical size and shape ,. reduce infection risk and improve basic andf instrumental ALDs performance.    Baseline Achieved for RLE w/ 13.86 volume reduction below the knee (ankle-tibial tuberosity A-D). LLE has not been measured yet but reduction is visible. I would estimate > 5% but < 10% thus far.    Time 12    Period Weeks    Status Partially Met   R leg reduction from ankle to knee (A-D) = 0.005% on  04/04/21. Since commencing OT for CDT RLE limb volume, altho reduced slightly, is essentially unchanged on the 9th visit.   Target Date 06/07/21      OT LONG TERM GOAL #4   Title Pt will consistently perform all LE self -care home protocols ( modified simple self MLD, skin care, therapeutic exercise, compression therapies) daily  with max CG assistance to to achieve max edema reduction, to enanble functional improvements,  including fitting preferred street shoes and standard sized clothing, and to achieve increased AROM and reduced falls risk.    Baseline Max A    Time 12    Period Weeks     Status On-going   Pt is unable to reach feet to perform sinmple self MLD and skin care. Flexitouch advanced sequential compression device with proximal to distal sequencing, like MLD, is medically necessary to limit progression.   Target Date 06/07/21      OT LONG TERM GOAL #5   Status Deferred                   Plan - 05/14/21 1224     Clinical Impression Statement Provided RLE/RLQ MLD utilizing functional inguinal  LN as established.. Provided skin care throughout MLD with low ph castor oil to increase skin mobility and hydration. Reapplied multilayer gradient compression wraps as established. Good tolerance for manual therapy without pain.Custom RLE replacement garment is ordered and will fit as soon as delivered. Despite 2 x weekly x 10 weeks OT for lymphedema care, including skin care, therapeutic exercise, MLD, elevationh and comression wraps, significant physical and sensory symptoms of lymphedema persist which limit functional performance in all ovccupational domains. Pt has trialed and failed  to achieve 10 weeks of conservative therapy. She has achieved  only a 0.005% limb volume reduction in the RLE to date. Commence LLE CDT as soon as RLE replacement garment fitting is complete.    OT Occupational Profile and History Comprehensive Assessment- Review of records and extensive additional review of physical, cognitive, psychosocial history related to current functional performance    Occupational performance deficits (Please refer to evaluation for details): ADL's;Work;IADL's;Rest and Sleep;Leisure;Social Participation;Other    Body Structure / Function / Physical Skills ADL;Skin integrity;Endurance;Balance;Mobility;Strength;Edema;Obesity;Pain;Gait;ROM;Decreased knowledge of precautions;Decreased knowledge of use of DME;IADL    Clinical Decision Making Multiple treatment options, significant modification of task necessary    Comorbidities Affecting Occupational Performance: Presence of  comorbidities impacting occupational performance    Modification or Assistance to Complete Evaluation  Max significant modification of tasks or assist is necessary to complete    OT Frequency 2x / week    OT Duration 12 weeks    OT Treatment/Interventions Self-care/ADL training;Therapeutic exercise;Coping strategies training;Therapeutic activities;Manual lymph drainage;Energy conservation;Manual Therapy;DME and/or AE instruction;Compression bandaging;Patient/family education    Plan CDT to BLE, one leg at a time, RL:E first    OT Home Exercise Plan fit with BLE custom flat knit compression knee highs    Recommended Other Services Lymphedema management has a high burden of care, especially for Patients who have difficulty, or who are unable to reach their distal legs and feet.  reach their feet and distal legs to aapply bandages, compression garments, to bathe , inspect skin and groom nails. In this case because Pt is unable to perform these activities, daily caregiver assistance with the lymphedema self-care home program  between OT visits is essential for achieving clinical success. Without ongoing, daily caregiver assistance during compression bandaging, donning and doffing compression garments, skin care, and MLD , Pt's prognosis is poor. However, with consistent caregiver assistance with lymphedema home program prognosis for improved lymphedema control and reducing infection risk and progression is good.    Consulted and Agree with Plan of Care Patient             Patient will benefit from skilled therapeutic intervention in order to improve the following deficits and impairments:   Body Structure / Function / Physical Skills: ADL, Skin integrity, Endurance, Balance, Mobility, Strength, Edema, Obesity, Pain, Gait, ROM, Decreased knowledge of precautions, Decreased knowledge of use of DME, IADL       Visit Diagnosis: Lymphedema, not elsewhere classified    Problem List Patient Active  Problem List   Diagnosis Date Noted   Osteoarthritis 10/04/2019   Spondylodiscitis 04/26/2019   Lymphedema 04/26/2019   Morbid obesity ([place]) 04/26/2019   BMI 45.0-49.9, adult ([place]) 08/20/2018   Controlled type 2 diabetes mellitus without complication, without long-term current use of insulin ([place]) 11/26/2017   GERD (gastroesophageal reflux disease) 11/26/2017   Hyperlipidemia, mixed 11/26/2017   Hypertension, essential 11/26/2017   Chest pain at rest 11/26/2017    Andrey Spearman, Nancy, OTR/L, CLT-LANA 05/14/21 12:40 PM  [place] MAIN [facility] SERVICES [address] [place], [place], [address] Phone: [phone]   Fax:  [phone]  Name: Nancy Riley MRN: [id number] Date of Birth: [birth year]

## 2021-05-16 ENCOUNTER — Other Ambulatory Visit: Payer: Self-pay

## 2021-05-16 ENCOUNTER — Ambulatory Visit: Payer: Medicare PPO | Admitting: Occupational Therapy

## 2021-05-16 DIAGNOSIS — I890 Lymphedema, not elsewhere classified: Secondary | ICD-10-CM | POA: Diagnosis not present

## 2021-05-16 NOTE — Therapy (Signed)
[place] MAIN [facility] SERVICES [address] [place], [place], [address] Phone: [phone]   Fax:  [phone]  Occupational Therapy Treatment  Patient Details  Name: LANE KJOS MRN: [id number] Date of Birth: 01/20/1956 Referring Provider (OT): Eulogio Ditch, NP   Encounter Date: 05/16/2021   OT End of Session - 05/16/21 1115     Visit Number 20    Number of Visits 36    Date for OT Re-Evaluation 06/07/21    OT Start Time 1115    OT Stop Time 1215    OT Time Calculation (min) 60 min    Activity Tolerance Patient tolerated treatment well;No increased pain;Patient limited by pain    Behavior During Therapy [facility] for tasks assessed/performed             Past Medical History:  Diagnosis Date   Diabetes mellitus without complication ([place])    DJD (degenerative joint disease)    GERD (gastroesophageal reflux disease)    Hypercholesteremia    Hypertension    Morbid obesity with BMI of 50.0-59.9, adult ([place])    Neuropathy    Phlegmon    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   Subjective Assessment - 05/16/21 1119     Subjective  Ms Burgo presents for OT Rx visit 20/36 to address BLE lymphedema. Pt arrives seated in a transport wc with  RLE knee length compression wraps in place. Pt rates bilateral knee pain at 7/10. Pt is agreeable to comparative limb volumetrics for progress report due today.    Pertinent History Spondylodiscitis, BLE lymphedema, HTN, DM, neuropathy, DJD, arthritis, sciatica,  chronic back pain, morbid obesity, (BMI 50-59.9)    Limitations Difficulty walking, impaired transfers/functional mobility, chronic back DJD and  knee  OA pain , chronic leg swelling decreased strength, impaired balance, impaired sensation (neuropathy in feet), increased infection risk, increased fall risk,    Repetition Increases Symptoms    Special Tests + Stemmer bilaterally    Patient Stated Goals get swelling back down and replace worn compression stockings to limit LE progression    Pain Onset Other (comment)   chronic                LYMPHEDEMA/ONCOLOGY QUESTIONNAIRE - 05/16/21 0001       Right Lower Extremity Lymphedema   Other RLE limb volume from ankle to tibial tuberosity (A-D) = 7829.9 ml today.    Other RLE limb volume is decreased by 20.63% since last measured on 04/04/21.                     OT Treatments/Exercises (OP) - 05/16/21 1352       ADLs   ADL Education Given Yes      Manual Therapy   Manual Therapy Edema management    Manual therapy comments skin care throughout MLD to increase hydration and flexibility    Edema Management RLE comparative limb volumetrics    Manual Lymphatic Drainage (MLD) MLD to RLE/RLQ with simultaneous skin care to increase skin flexibility and to increase hydration. Utilized modified sjort neck sequence  omitting lateral neck  strokes and performing J strokes to clavicular regions only. Pt instructed in diaphragmatic breathing during MLD. Utilized functional inguinal LN and deep adominal pathways working from proximal to distal through functional inguinal LN/. No c/o pain    throughout Rx.    Compression Bandaging RLE gradient compression wrraps using  one 8 and 10 cm wide short stretch wrap, with 2 12 cm wide wraps in gradient pattern over single layer stocinett and 0.4 cm thick rosidal foam.                    OT Education - 05/16/21 1355     Education Details Continued skilled Pt/caregiver education  And LE ADL training throughout visit for lymphedema self care/ home program, including compression wrapping, compression garment and device wear/care,  lymphatic pumping ther ex, simple self-MLD, and skin care. Pt specificaly reminded not to leave gradient compression wraps applied in clinic in place for more than 48 hours, at the most, to limit risk of injury to skin and/ or infection risk.    Person(s) Educated Patient    Methods Explanation;Demonstration    Comprehension Verbalized understanding;Need further instruction;Returned demonstration                 OT Long Term Goals - 05/16/21 1115       OT LONG TERM GOAL #1   Title Pt will demonstrate understanding of lymphedema precautions and signs/ symptoms of cellulitisby identifying 6 items for each using printed Lymphedema Workbook for reference PRN (modified independence) to limit LE progression and infection risk.    Baseline minA    Time 4    Period Days    Status Achieved      OT LONG TERM GOAL #2   Title Pt will require Max assist from caregiver who will be able to apply multilayer, gradient compression wraps using correct techniques after skilled training to reduce limb volume and limity LE progression.    Baseline dependent    Time 4    Period Days    Status Achieved      OT LONG TERM GOAL #3   Title Pt will achieve 10% limb volume reductions  bilaterally from ankle to tibial tuberosity during Intensive Phase CDT to return limbs to typical size and shape ,. reduce infection risk and improve basic andf instrumental ALDs performance.    Baseline intake volume RLE A-D= 9886.0 ml. intake volume LLE A-D= 10309.9 ml.   Achieved for RLE w/ 20.63% reduction on visit 20, 05/16/21.   Time 12    Period Weeks    Status Partially Met   R leg reduction from ankle to knee (A-D) = 0.005% on  04/04/21. Since commencing OT for CDT RLE limb volume, altho reduced slightly, is essentially unchanged on the 9th visit.   Target Date 06/07/21      OT LONG TERM GOAL #4   Title Pt will consistently perform all LE self -care home protocols ( modified simple self MLD, skin care, therapeutic  exercise, compression therapies) daily  with max CG assistance to to achieve max edema reduction, to enanble functional improvements,  including fitting preferred street shoes and standard sized clothing, and to achieve increased AROM and reduced falls risk.    Baseline Max A    Time 12    Period Weeks    Status On-going   Basic sequential pump trial failed as significant LE symptoms persist. Trial of advanced Flexitouch device was recently completed and results of insurance benefits is  pending.   Target Date 06/07/21      OT LONG TERM GOAL #5   Status Deferred   goal discharged.Pt has not pursued this recommendation                  Plan - 05/16/21 1342     Clinical Impression Statement Pt continues to demonstrate excellent progress towards all OT goals for lymphedema care. Pt met and exceeded  her 10%  volumetric reduction goal  for the RLE today with a 20.63% reduction below the knee. Custom RLE garment has been ordered and we are awaiting delivery. Refer to LONG TERM GOALS section of note for additional detailed progress to date. Provided RLE/RLQ MLD utilizing functional inguinal LN as established.. Provided skin care throughout MLD with low ph castor oil to increase skin mobility and hydration. Reapplied multilayer gradient compression wraps as established. Good tolerance for manual therapy without pain. Commence LLE CDT as soon as RLE replacement garment fitting is complete.    OT Occupational Profile and History Comprehensive Assessment- Review of records and extensive additional review of physical, cognitive, psychosocial history related to current functional performance    Occupational performance deficits (Please refer to evaluation for details): ADL's;Work;IADL's;Rest and Sleep;Leisure;Social Participation;Other    Body Structure / Function / Physical Skills ADL;Skin integrity;Endurance;Balance;Mobility;Strength;Edema;Obesity;Pain;Gait;ROM;Decreased knowledge of  precautions;Decreased knowledge of use of DME;IADL    Clinical Decision Making Multiple treatment options, significant modification of task necessary    Comorbidities Affecting Occupational Performance: Presence of comorbidities impacting occupational performance    Modification or Assistance to Complete Evaluation  Max significant modification of tasks or assist is necessary to complete    OT Frequency 2x / week    OT Duration 12 weeks    OT Treatment/Interventions Self-care/ADL training;Therapeutic exercise;Coping strategies training;Therapeutic activities;Manual lymph drainage;Energy conservation;Manual Therapy;DME and/or AE instruction;Compression bandaging;Patient/family education    Plan CDT to BLE, one leg at a time, RL:E first    OT Home Exercise Plan fit with BLE custom flat knit compression knee highs    Recommended Other Services Lymphedema management has a high burden of care, especially for Patients who have difficulty, or who are unable to reach their distal legs and feet.  reach their feet and distal legs to aapply bandages, compression garments, to bathe , inspect skin and groom nails. In this case because Pt is unable to perform these activities, daily caregiver assistance with the lymphedema self-care home program  between OT visits is essential for achieving clinical success. Without ongoing, daily caregiver assistance during compression bandaging, donning and doffing compression garments, skin care, and MLD , Pt's prognosis is poor. However, with consistent caregiver assistance with lymphedema home program prognosis for improved lymphedema control and reducing infection risk and progression is good.    Consulted and Agree with Plan of Care Patient             Patient will benefit from skilled therapeutic intervention in order to improve the following deficits and impairments:   Body Structure / Function / Physical Skills: ADL, Skin integrity, Endurance, Balance, Mobility,  Strength, Edema, Obesity, Pain, Gait, ROM, Decreased knowledge of precautions, Decreased knowledge of use of DME, IADL       Visit Diagnosis: Lymphedema, not elsewhere classified    Problem List Patient Active Problem List   Diagnosis Date Noted   Osteoarthritis 10/04/2019   Spondylodiscitis 04/26/2019   Lymphedema 04/26/2019   Morbid obesity ([place]) 04/26/2019   BMI 45.0-49.9, adult ([place]) 08/20/2018   Controlled type 2 diabetes  mellitus without complication, without long-term current use of insulin ([place]) 11/26/2017   GERD (gastroesophageal reflux disease) 11/26/2017   Hyperlipidemia, mixed 11/26/2017   Hypertension, essential 11/26/2017   Chest pain at rest 11/26/2017    Andrey Spearman, MS, OTR/L, [facility] 05/16/21 1:56 PM   [place] MAIN [facility] SERVICES [place], [place], [address] Phone: [phone]   Fax:  [phone]  Name: AZARIYA FREEMAN MRN: [id number] Date of Birth: 1955-06-04

## 2021-05-16 NOTE — Patient Instructions (Signed)

## 2021-05-21 ENCOUNTER — Ambulatory Visit: Payer: Medicare PPO | Admitting: Occupational Therapy

## 2021-05-21 ENCOUNTER — Other Ambulatory Visit: Payer: Self-pay

## 2021-05-21 DIAGNOSIS — I890 Lymphedema, not elsewhere classified: Secondary | ICD-10-CM | POA: Diagnosis not present

## 2021-05-21 NOTE — Patient Instructions (Signed)

## 2021-05-21 NOTE — Therapy (Signed)
[place] MAIN [facility] SERVICES [address] [place], [place], [address] Phone: [phone]   Fax:  [phone]  Occupational Therapy Treatment  Patient Details  Name: Nancy Riley MRN: [id number] Date of Birth: [birth year] Referring Provider (OT): Eulogio Ditch, NP   Encounter Date: 05/21/2021   OT End of Session - 05/21/21 1107     Visit Number 21    Number of Visits 36    Date for OT Re-Evaluation 06/07/21    OT Start Time 1105    OT Stop Time 3612    OT Time Calculation (min) 60 min    Activity Tolerance Patient tolerated treatment well;No increased pain;Patient limited by pain    Behavior During Therapy [facility] for tasks assessed/performed             Past Medical History:  Diagnosis Date   Diabetes mellitus without complication ([place])    DJD (degenerative joint disease)    GERD (gastroesophageal reflux disease)    Hypercholesteremia    Hypertension    Morbid obesity with BMI of 50.0-59.9, adult ([place])    Neuropathy    Phlegmon    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   Subjective Assessment - 05/21/21 1117     Subjective  Nancy Riley presents for OT Rx visit 21/36 to address BLE lymphedema. Pt arrives seated in a transport wc with  RLE knee length compression wraps in place. Pt rates bilateral knee pain at 7/10.    Pertinent History Spondylodiscitis, BLE lymphedema, HTN, DM, neuropathy, DJD, arthritis, sciatica,  chronic back pain, morbid obesity, (BMI 50-59.9)    Limitations Difficulty walking, impaired transfers/functional mobility, chronic back DJD and knee  OA pain , chronic leg swelling decreased strength, impaired balance,  impaired sensation (neuropathy in feet), increased infection risk, increased fall risk,    Repetition Increases Symptoms    Special Tests + Stemmer bilaterally    Patient Stated Goals get swelling back down and replace worn compression stockings to limit LE progression    Pain Onset Other (comment)   chronic                         OT Treatments/Exercises (OP) - 05/21/21 1240       ADLs   ADL Education Given Yes      Manual Therapy   Manual Therapy Edema management    Edema Management fitted RLE custom knee length compression stocking.    Compression Bandaging LLE gradient compression wrraps using  one 8 and 10 cm wide short stretch wrap, with 2 12 cm wide wraps in gradient pattern over single layer stocinett and 0.4 cm thick rosidal foam.                    OT Education - 05/21/21 1243     Education Details Skilled OT instruction in LE self-care, including custom compression garments, or alternatives, wear and care regimes, garment positioning, contraindications. Reviewed donning and doffing garments using assistive devices .    Person(s) Educated Patient    Methods Explanation;Demonstration;Verbal cues;Tactile cues  Comprehension Verbalized understanding;Need further instruction;Returned demonstration                 OT Long Term Goals - 05/16/21 1115       OT LONG TERM GOAL #1   Title Pt will demonstrate understanding of lymphedema precautions and signs/ symptoms of cellulitisby identifying 6 items for each using printed Lymphedema Workbook for reference PRN (modified independence) to limit LE progression and infection risk.    Baseline minA    Time 4    Period Days    Status Achieved      OT LONG TERM GOAL #2   Title Pt will require Max assist from caregiver who will be able to apply multilayer, gradient compression wraps using correct techniques after skilled training to reduce limb volume and limity LE progression.    Baseline  dependent    Time 4    Period Days    Status Achieved      OT LONG TERM GOAL #3   Title Pt will achieve 10% limb volume reductions  bilaterally from ankle to tibial tuberosity during Intensive Phase CDT to return limbs to typical size and shape ,. reduce infection risk and improve basic andf instrumental ALDs performance.    Baseline intake volume RLE A-D= 9886.0 ml. intake volume LLE A-D= 10309.9 ml.   Achieved for RLE w/ 20.63% reduction on visit 20, 05/16/21.   Time 12    Period Weeks    Status Partially Met   R leg reduction from ankle to knee (A-D) = 0.005% on  04/04/21. Since commencing OT for CDT RLE limb volume, altho reduced slightly, is essentially unchanged on the 9th visit.   Target Date 06/07/21      OT LONG TERM GOAL #4   Title Pt will consistently perform all LE self -care home protocols ( modified simple self MLD, skin care, therapeutic exercise, compression therapies) daily  with max CG assistance to to achieve max edema reduction, to enanble functional improvements,  including fitting preferred street shoes and standard sized clothing, and to achieve increased AROM and reduced falls risk.    Baseline Max A    Time 12    Period Weeks    Status On-going   Basic sequential pump trial failed as significant LE symptoms persist. Trial of advanced Flexitouch device was recently completed and results of insurance benefits is pending.   Target Date 06/07/21      OT LONG TERM GOAL #5   Status Deferred   goal discharged.Pt has not pursued this recommendation                  Plan - 05/21/21 1233     Clinical Impression Statement Completed initial fitting of  RLE knee length custom compression garment. Other than measurement changes, only  change we made in the garment is to increase from flat knit ccl 3 ( 34-46 mmHg) to ccl 3 Forte , which is same compression class but provides increased containment by creating slightly increased knitting density , or garment thickness. All  garment specifications are correct and Pt expresses comfort in stocking. She will wash and wear during visit interval and report any issues at next session on 12/22.  Reviewed wear and care instructions. Remainder of session devoted to applying multilayer , gradient compression wraps to LLE from base of toes to tibial tuberosity and poplityeal fossa. Luckily Pt is able to fit existing shoe over wraps with adjustment to lacin. Cont ass per POC.Marland Kitchen  OT Occupational Profile and History Comprehensive Assessment- Review of records and extensive additional review of physical, cognitive, psychosocial history related to current functional performance    Occupational performance deficits (Please refer to evaluation for details): ADL's;Work;IADL's;Rest and Sleep;Leisure;Social Participation;Other    Body Structure / Function / Physical Skills ADL;Skin integrity;Endurance;Balance;Mobility;Strength;Edema;Obesity;Pain;Gait;ROM;Decreased knowledge of precautions;Decreased knowledge of use of DME;IADL    Clinical Decision Making Multiple treatment options, significant modification of task necessary    Comorbidities Affecting Occupational Performance: Presence of comorbidities impacting occupational performance    Modification or Assistance to Complete Evaluation  Max significant modification of tasks or assist is necessary to complete    OT Frequency 2x / week    OT Duration 12 weeks    OT Treatment/Interventions Self-care/ADL training;Therapeutic exercise;Coping strategies training;Therapeutic activities;Manual lymph drainage;Energy conservation;Manual Therapy;DME and/or AE instruction;Compression bandaging;Patient/family education    Plan CDT to BLE, one leg at a time, RL:E first    OT Home Exercise Plan 05/21/21: initial fitting of Jobst, Elvarex, flat knit, custom,  RLE, A-D, ccl 3 F (34-46 mmHg)  with open toe, t-heel, 5cm SP top band on oblique top edge.    Recommended Other Services Lymphedema management has a  high burden of care, especially for Patients who have difficulty, or who are unable to reach their distal legs and feet.  reach their feet and distal legs to aapply bandages, compression garments, to bathe , inspect skin and groom nails. In this case because Pt is unable to perform these activities, daily caregiver assistance with the lymphedema self-care home program  between OT visits is essential for achieving clinical success. Without ongoing, daily caregiver assistance during compression bandaging, donning and doffing compression garments, skin care, and MLD , Pt's prognosis is poor. However, with consistent caregiver assistance with lymphedema home program prognosis for improved lymphedema control and reducing infection risk and progression is good.    Consulted and Agree with Plan of Care Patient             Patient will benefit from skilled therapeutic intervention in order to improve the following deficits and impairments:   Body Structure / Function / Physical Skills: ADL, Skin integrity, Endurance, Balance, Mobility, Strength, Edema, Obesity, Pain, Gait, ROM, Decreased knowledge of precautions, Decreased knowledge of use of DME, IADL       Visit Diagnosis: Lymphedema, not elsewhere classified    Problem List Patient Active Problem List   Diagnosis Date Noted   Osteoarthritis 10/04/2019   Spondylodiscitis 04/26/2019   Lymphedema 04/26/2019   Morbid obesity ([place]) 04/26/2019   BMI 45.0-49.9, adult ([place]) 08/20/2018   Controlled type 2 diabetes mellitus without complication, without long-term current use of insulin ([place]) 11/26/2017   GERD (gastroesophageal reflux disease) 11/26/2017   Hyperlipidemia, mixed 11/26/2017   Hypertension, essential 11/26/2017   Chest pain at rest 11/26/2017    Andrey Spearman, Nancy, OTR/L, [facility] 05/21/21 12:52 PM   [place] MAIN [facility] SERVICES [place], [place], [address] Phone:  [phone]   Fax:  [phone]  Name: Nancy Riley MRN: [id number] Date of Birth: [birth year]

## 2021-05-22 ENCOUNTER — Encounter: Payer: Medicare PPO | Admitting: Occupational Therapy

## 2021-05-24 ENCOUNTER — Ambulatory Visit: Payer: Medicare PPO | Admitting: Occupational Therapy

## 2021-05-24 ENCOUNTER — Other Ambulatory Visit: Payer: Self-pay

## 2021-05-24 DIAGNOSIS — I890 Lymphedema, not elsewhere classified: Secondary | ICD-10-CM | POA: Diagnosis not present

## 2021-05-24 NOTE — Patient Instructions (Signed)

## 2021-05-24 NOTE — Therapy (Signed)
[place] MAIN [facility] SERVICES [address] [place], [place], [address] Phone: [phone]   Fax:  [phone]  Occupational Therapy Treatment  Patient Details  Name: Nancy Riley MRN: [id number] Date of Birth: February 07, 1956 Referring Provider (OT): Eulogio Ditch, NP   Encounter Date: 05/24/2021   OT End of Session - 05/24/21 1020     Visit Number 22    Number of Visits 36    Date for OT Re-Evaluation 06/07/21    OT Start Time 1015    OT Stop Time 1115    OT Time Calculation (min) 60 min    Activity Tolerance Patient tolerated treatment well;No increased pain;Patient limited by pain    Behavior During Therapy [facility] for tasks assessed/performed             Past Medical History:  Diagnosis Date   Diabetes mellitus without complication ([place])    DJD (degenerative joint disease)    GERD (gastroesophageal reflux disease)    Hypercholesteremia    Hypertension    Morbid obesity with BMI of 50.0-59.9, adult ([place])    Neuropathy    Phlegmon    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   Subjective Assessment - 05/24/21 1148     Subjective  Ms Dinning presents for OT Rx visit 22/36 to address BLE lymphedema. Pt arrives seated in a transport wc with new RLE compression knee high in place and LLE wraps in place. Pt denies any issues with the RLE stocking. She tells me she is able to don and doff it using carpent as a friction matt and a long, wooden, homemade doffing stick.    Pertinent History Spondylodiscitis, BLE lymphedema, HTN, DM, neuropathy, DJD, arthritis, sciatica,  chronic back pain, morbid obesity, (BMI 50-59.9)    Limitations Difficulty  walking, impaired transfers/functional mobility, chronic back DJD and knee  OA pain , chronic leg swelling decreased strength, impaired balance, impaired sensation (neuropathy in feet), increased infection risk, increased fall risk,    Repetition Increases Symptoms    Special Tests + Stemmer bilaterally    Patient Stated Goals get swelling back down and replace worn compression stockings to limit LE progression    Pain Onset Other (comment)   chronic                         OT Treatments/Exercises (OP) - 05/24/21 1150       ADLs   ADL Education Given Yes      Manual Therapy   Manual Therapy Edema management    Edema Management final RLE compression garment assessment    Manual Lymphatic Drainage (MLD) MLD to LLE/LLQ with simultaneous skin care to increase skin flexibility and to increase hydration. Utilized modified sjort neck sequence  omitting lateral neck strokes and performing J strokes to clavicular regions only. Pt instructed in diaphragmatic breathing during MLD. Utilized functional inguinal LN and deep adominal pathways working from proximal to distal through functional inguinal LN/. No c/o pain    throughout Rx.    Compression Bandaging LLE gradient compression wrraps using  one 8 and  10 cm wide short stretch wrap, with 2 12 cm wide wraps in gradient pattern over single layer stocinett and 0.4 cm thick rosidal foam.                    OT Education - 05/24/21 1151     Education Details Continued skilled Pt/caregiver education  And LE ADL training throughout visit for lymphedema self care/ home program, including compression wrapping, compression garment and device wear/care, lymphatic pumping ther ex, simple self-MLD, and skin care. Pt specificaly reminded not to leave gradient compression wraps applied in clinic in place for more than 48 hours, at the most, to limit risk of injury to skin and/ or infection risk.    Person(s) Educated Patient    Methods  Explanation;Demonstration    Comprehension Verbalized understanding;Need further instruction;Returned demonstration                 OT Long Term Goals - 05/16/21 1115       OT LONG TERM GOAL #1   Title Pt will demonstrate understanding of lymphedema precautions and signs/ symptoms of cellulitisby identifying 6 items for each using printed Lymphedema Workbook for reference PRN (modified independence) to limit LE progression and infection risk.    Baseline minA    Time 4    Period Days    Status Achieved      OT LONG TERM GOAL #2   Title Pt will require Max assist from caregiver who will be able to apply multilayer, gradient compression wraps using correct techniques after skilled training to reduce limb volume and limity LE progression.    Baseline dependent    Time 4    Period Days    Status Achieved      OT LONG TERM GOAL #3   Title Pt will achieve 10% limb volume reductions  bilaterally from ankle to tibial tuberosity during Intensive Phase CDT to return limbs to typical size and shape ,. reduce infection risk and improve basic andf instrumental ALDs performance.    Baseline intake volume RLE A-D= 9886.0 ml. intake volume LLE A-D= 10309.9 ml.   Achieved for RLE w/ 20.63% reduction on visit 20, 05/16/21.   Time 12    Period Weeks    Status Partially Met   R leg reduction from ankle to knee (A-D) = 0.005% on  04/04/21. Since commencing OT for CDT RLE limb volume, altho reduced slightly, is essentially unchanged on the 9th visit.   Target Date 06/07/21      OT LONG TERM GOAL #4   Title Pt will consistently perform all LE self -care home protocols ( modified simple self MLD, skin care, therapeutic exercise, compression therapies) daily  with max CG assistance to to achieve max edema reduction, to enanble functional improvements,  including fitting preferred street shoes and standard sized clothing, and to achieve increased AROM and reduced falls risk.    Baseline Max A    Time 12     Period Weeks    Status On-going   Basic sequential pump trial failed as significant LE symptoms persist. Trial of advanced Flexitouch device was recently completed and results of insurance benefits is pending.   Target Date 06/07/21      OT LONG TERM GOAL #5   Status Deferred   goal discharged.Pt has not pursued this recommendation                  Plan - 05/24/21 1143     Clinical  Impression Statement Pt denies problems with new RLE compression stocking replacement. Garment appears to fit well, it is comfortaBLE, AND APPEARS TO BE CONTAINING rle SWELLING VERY WELL.pT IS ABLE TO AND DOFF THE GARMENT WITH MODIFIED INDEPENDENCE ( ASSISTIVE DEVICES AND EXTRA TIME). RLE fitting complete.By visual assessment and palpation LLE volume and tissue density is clearly reduced since last visit when we initiated  Intensive Phase CDT. Continued MLD, concurrent skin care and multilayer compression wrapping to LLE today. Pt tolerated all aspects of OT for CDT without increased pain.    OT Occupational Profile and History Comprehensive Assessment- Review of records and extensive additional review of physical, cognitive, psychosocial history related to current functional performance    Occupational performance deficits (Please refer to evaluation for details): ADL's;Work;IADL's;Rest and Sleep;Leisure;Social Participation;Other    Body Structure / Function / Physical Skills ADL;Skin integrity;Endurance;Balance;Mobility;Strength;Edema;Obesity;Pain;Gait;ROM;Decreased knowledge of precautions;Decreased knowledge of use of DME;IADL    Clinical Decision Making Multiple treatment options, significant modification of task necessary    Comorbidities Affecting Occupational Performance: Presence of comorbidities impacting occupational performance    Modification or Assistance to Complete Evaluation  Max significant modification of tasks or assist is necessary to complete    OT Frequency 2x / week    OT Duration  12 weeks    OT Treatment/Interventions Self-care/ADL training;Therapeutic exercise;Coping strategies training;Therapeutic activities;Manual lymph drainage;Energy conservation;Manual Therapy;DME and/or AE instruction;Compression bandaging;Patient/family education    Plan CDT to BLE, one leg at a time, RL:E first    OT Home Exercise Plan 05/21/21: initial fitting of Jobst, Elvarex, flat knit, custom,  RLE, A-D, ccl 3 F (34-46 mmHg)  with open toe, t-heel, 5cm SP top band on oblique top edge.    Recommended Other Services Lymphedema management has a high burden of care, especially for Patients who have difficulty, or who are unable to reach their distal legs and feet.  reach their feet and distal legs to aapply bandages, compression garments, to bathe , inspect skin and groom nails. In this case because Pt is unable to perform these activities, daily caregiver assistance with the lymphedema self-care home program  between OT visits is essential for achieving clinical success. Without ongoing, daily caregiver assistance during compression bandaging, donning and doffing compression garments, skin care, and MLD , Pt's prognosis is poor. However, with consistent caregiver assistance with lymphedema home program prognosis for improved lymphedema control and reducing infection risk and progression is good.    Consulted and Agree with Plan of Care Patient             Patient will benefit from skilled therapeutic intervention in order to improve the following deficits and impairments:   Body Structure / Function / Physical Skills: ADL, Skin integrity, Endurance, Balance, Mobility, Strength, Edema, Obesity, Pain, Gait, ROM, Decreased knowledge of precautions, Decreased knowledge of use of DME, IADL       Visit Diagnosis: Lymphedema, not elsewhere classified    Problem List Patient Active Problem List   Diagnosis Date Noted   Osteoarthritis 10/04/2019   Spondylodiscitis 04/26/2019   Lymphedema  04/26/2019   Morbid obesity ([place]) 04/26/2019   BMI 45.0-49.9, adult ([place]) 08/20/2018   Controlled type 2 diabetes mellitus without complication, without long-term current use of insulin ([place]) 11/26/2017   GERD (gastroesophageal reflux disease) 11/26/2017   Hyperlipidemia, mixed 11/26/2017   Hypertension, essential 11/26/2017   Chest pain at rest 11/26/2017    Andrey Spearman, MS, OTR/L, CLT-LANA 05/24/21 11:53 AM   [place]  SERVICES [place], [place], [address] Phone: [phone]   Fax:  [phone]  Name: Nancy Riley MRN: [id number] Date of Birth: 01-21-1956

## 2021-06-07 ENCOUNTER — Other Ambulatory Visit: Payer: Self-pay

## 2021-06-07 ENCOUNTER — Ambulatory Visit: Payer: Medicare PPO | Attending: Nurse Practitioner | Admitting: Occupational Therapy

## 2021-06-07 DIAGNOSIS — I890 Lymphedema, not elsewhere classified: Secondary | ICD-10-CM | POA: Diagnosis not present

## 2021-06-07 NOTE — Therapy (Signed)
[place] MAIN [facility] SERVICES [address] [place], [place], [address] Phone: [phone]   Fax:  [phone]  Occupational Therapy Treatment  Patient Details  Name: Nancy Riley MRN: [id number] Date of Birth: 09-Feb-1956 Referring Provider (OT): Eulogio Ditch, NP   Encounter Date: 06/07/2021   OT End of Session - 06/07/21 1223     Visit Number 23    Number of Visits 36    Date for OT Re-Evaluation 09/05/21    OT Start Time 1110    OT Stop Time 1215    OT Time Calculation (min) 65 min    Activity Tolerance Patient tolerated treatment well;No increased pain;Patient limited by pain    Behavior During Therapy [facility] for tasks assessed/performed             Past Medical History:  Diagnosis Date   Diabetes mellitus without complication ([place])    DJD (degenerative joint disease)    GERD (gastroesophageal reflux disease)    Hypercholesteremia    Hypertension    Morbid obesity with BMI of 50.0-59.9, adult ([place])    Neuropathy    Phlegmon    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   Subjective Assessment - 06/07/21 1228     Subjective  Ms Nancy Riley presents for OT Rx visit 23/36 to address BLE lymphedema. Pt arrives seated in a transport wc with new RLE compression knee high in place and LLE wraps in place. Pt reportsa that her sister assisted her with compression wrapping LLE during visit interval 3-4 times. She is able to don and doff custom RLE compression garment using assistive devices and extra time.    Pertinent History Spondylodiscitis, BLE lymphedema, HTN, DM, neuropathy, DJD, arthritis, sciatica,  chronic back pain, morbid obesity, (BMI  50-59.9)    Limitations Difficulty walking, impaired transfers/functional mobility, chronic back DJD and knee  OA pain , chronic leg swelling decreased strength, impaired balance, impaired sensation (neuropathy in feet), increased infection risk, increased fall risk,    Repetition Increases Symptoms    Special Tests + Stemmer bilaterally    Patient Stated Goals get swelling back down and replace worn compression stockings to limit LE progression    Pain Onset Other (comment)   chronic                         OT Treatments/Exercises (OP) - 06/07/21 1230       ADLs   ADL Education Given Yes      Manual Therapy   Manual Therapy Edema management    Manual Lymphatic Drainage (MLD) MLD to LLE/LLQ with simultaneous skin care to increase skin flexibility and to increase hydration. Utilized modified sjort neck sequence  omitting lateral neck strokes and performing J strokes to clavicular regions only. Pt instructed in diaphragmatic breathing during MLD. Utilized functional inguinal LN and deep adominal pathways working from proximal to distal through functional inguinal LN/. No c/o pain    throughout Rx.    Compression Bandaging LLE gradient compression wrraps using  one 8 and 10 cm wide short stretch wrap, with 2 12  cm wide wraps in gradient pattern over single layer stocinett and 0.4 cm thick rosidal foam.                    OT Education - 06/07/21 1231     Education Details Continued skilled Pt/caregiver education  And LE ADL training throughout visit for lymphedema self care/ home program, including compression wrapping, compression garment and device wear/care, lymphatic pumping ther ex, simple self-MLD, and skin care. Pt specificaly reminded not to leave gradient compression wraps applied in clinic in place for more than 48 hours, at the most, to limit risk of injury to skin and/ or infection risk.    Person(s) Educated Patient    Methods Explanation;Demonstration     Comprehension Verbalized understanding;Need further instruction;Returned demonstration                 OT Long Term Goals - 05/16/21 1115       OT LONG TERM GOAL #1   Title Pt will demonstrate understanding of lymphedema precautions and signs/ symptoms of cellulitisby identifying 6 items for each using printed Lymphedema Workbook for reference PRN (modified independence) to limit LE progression and infection risk.    Baseline minA    Time 4    Period Days    Status Achieved      OT LONG TERM GOAL #2   Title Pt will require Max assist from caregiver who will be able to apply multilayer, gradient compression wraps using correct techniques after skilled training to reduce limb volume and limity LE progression.    Baseline dependent    Time 4    Period Days    Status Achieved      OT LONG TERM GOAL #3   Title Pt will achieve 10% limb volume reductions  bilaterally from ankle to tibial tuberosity during Intensive Phase CDT to return limbs to typical size and shape ,. reduce infection risk and improve basic andf instrumental ALDs performance.    Baseline intake volume RLE A-D= 9886.0 ml. intake volume LLE A-D= 10309.9 ml.   Achieved for RLE w/ 20.63% reduction on visit 20, 05/16/21.   Time 12    Period Weeks    Status Partially Met   R leg reduction from ankle to knee (A-D) = 0.005% on  04/04/21. Since commencing OT for CDT RLE limb volume, altho reduced slightly, is essentially unchanged on the 9th visit.   Target Date 06/07/21      OT LONG TERM GOAL #4   Title Pt will consistently perform all LE self -care home protocols ( modified simple self MLD, skin care, therapeutic exercise, compression therapies) daily  with max CG assistance to to achieve max edema reduction, to enanble functional improvements,  including fitting preferred street shoes and standard sized clothing, and to achieve increased AROM and reduced falls risk.    Baseline Max A    Time 12    Period Weeks    Status  On-going   Basic sequential pump trial failed as significant LE symptoms persist. Trial of advanced Flexitouch device was recently completed and results of insurance benefits is pending.   Target Date 06/07/21      OT LONG TERM GOAL #5   Status Deferred   goal discharged.Pt has not pursued this recommendation                  Plan - 06/07/21 1224     Clinical Impression Statement Pt managing RLE lymphedema below the knee  very well since last seen on 12/22 . She is wearing daily as directed and is able to dona and doff with modified independence with extra time using assistive devices. LLE swelling and tissue density is noticably decreased since last visit before [facility] WITH ASSISTANCE FROM HER SISTER FOR WRAPPING INTERMITTENTLY. Today empasis of OT was on MLD and compression wraps to LLE as established. Pt is tolerating all aspects of treatment well and continues to demonstrate steady progress towards all goals. Cont as per POC.    OT Occupational Profile and History Comprehensive Assessment- Review of records and extensive additional review of physical, cognitive, psychosocial history related to current functional performance    Occupational performance deficits (Please refer to evaluation for details): ADL's;Work;IADL's;Rest and Sleep;Leisure;Social Participation;Other    Body Structure / Function / Physical Skills ADL;Skin integrity;Endurance;Balance;Mobility;Strength;Edema;Obesity;Pain;Gait;ROM;Decreased knowledge of precautions;Decreased knowledge of use of DME;IADL    Clinical Decision Making Multiple treatment options, significant modification of task necessary    Comorbidities Affecting Occupational Performance: Presence of comorbidities impacting occupational performance    Modification or Assistance to Complete Evaluation  Max significant modification of tasks or assist is necessary to complete    OT Frequency 2x / week    OT Duration 12 weeks    OT Treatment/Interventions  Self-care/ADL training;Therapeutic exercise;Coping strategies training;Therapeutic activities;Manual lymph drainage;Energy conservation;Manual Therapy;DME and/or AE instruction;Compression bandaging;Patient/family education    Plan CDT to BLE, one leg at a time, RL:E first    OT Home Exercise Plan 05/21/21: initial fitting of Jobst, Elvarex, flat knit, custom,  RLE, A-D, ccl 3 F (34-46 mmHg)  with open toe, t-heel, 5cm SP top band on oblique top edge.    Recommended Other Services Lymphedema management has a high burden of care, especially for Patients who have difficulty, or who are unable to reach their distal legs and feet.  reach their feet and distal legs to aapply bandages, compression garments, to bathe , inspect skin and groom nails. In this case because Pt is unable to perform these activities, daily caregiver assistance with the lymphedema self-care home program  between OT visits is essential for achieving clinical success. Without ongoing, daily caregiver assistance during compression bandaging, donning and doffing compression garments, skin care, and MLD , Pt's prognosis is poor. However, with consistent caregiver assistance with lymphedema home program prognosis for improved lymphedema control and reducing infection risk and progression is good.    Consulted and Agree with Plan of Care Patient             Patient will benefit from skilled therapeutic intervention in order to improve the following deficits and impairments:   Body Structure / Function / Physical Skills: ADL, Skin integrity, Endurance, Balance, Mobility, Strength, Edema, Obesity, Pain, Gait, ROM, Decreased knowledge of precautions, Decreased knowledge of use of DME, IADL       Visit Diagnosis: Lymphedema, not elsewhere classified - Plan: Ot plan of care cert/re-cert    Problem List Patient Active Problem List   Diagnosis Date Noted   Osteoarthritis 10/04/2019   Spondylodiscitis 04/26/2019   Lymphedema  04/26/2019   Morbid obesity ([place]) 04/26/2019   BMI 45.0-49.9, adult ([place]) 08/20/2018   Controlled type 2 diabetes mellitus without complication, without long-term current use of insulin ([place]) 11/26/2017   GERD (gastroesophageal reflux disease) 11/26/2017   Hyperlipidemia, mixed 11/26/2017   Hypertension, essential 11/26/2017   Chest pain at rest 11/26/2017   Andrey Spearman, MS, OTR/L, CLT-LANA 06/07/21 12:34 PM    [place] MAIN  [place], [place], [address] Phone: [phone]   Fax:  [phone]  Name: Nancy Riley MRN: [id number] Date of Birth: 1956-05-25

## 2021-06-07 NOTE — Patient Instructions (Signed)

## 2021-06-11 ENCOUNTER — Ambulatory Visit: Payer: Medicare PPO | Admitting: Occupational Therapy

## 2021-06-11 ENCOUNTER — Other Ambulatory Visit: Payer: Self-pay

## 2021-06-11 DIAGNOSIS — I890 Lymphedema, not elsewhere classified: Secondary | ICD-10-CM

## 2021-06-12 NOTE — Patient Instructions (Signed)

## 2021-06-12 NOTE — Therapy (Signed)
[place] MAIN [facility] SERVICES [address] [place], [place], [address] Phone: [phone]   Fax:  [phone]  Occupational Therapy Treatment  Patient Details  Name: Nancy Riley MRN: [id number] Date of Birth: 01-18-1956 Referring Provider (OT): Eulogio Ditch, NP   Encounter Date: 06/11/2021   OT End of Session - 06/11/21 1412     Visit Number 24    Number of Visits 36    Date for OT Re-Evaluation 09/05/21    OT Start Time 0208    OT Stop Time 0305    OT Time Calculation (min) 57 min    Activity Tolerance Patient tolerated treatment well;No increased pain;Patient limited by pain    Behavior During Therapy [facility] for tasks assessed/performed             Past Medical History:  Diagnosis Date   Diabetes mellitus without complication ([place])    DJD (degenerative joint disease)    GERD (gastroesophageal reflux disease)    Hypercholesteremia    Hypertension    Morbid obesity with BMI of 50.0-59.9, adult ([place])    Neuropathy    Phlegmon    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   Subjective Assessment - 06/11/21 1414     Subjective  Ms Sawatzky presents for OT Rx visit 24/36 to address BLE lymphedema. Pt arrives seated in a transport wc with new RLE compression knee high in place and LLE wraps in place. Pt reports bilateral knee pain 4/10. Pt denies difficulty with compression wrapping over the weekend.    Pertinent History Spondylodiscitis, BLE lymphedema, HTN, DM, neuropathy, DJD, arthritis, sciatica,  chronic back pain, morbid obesity, (BMI 50-59.9)    Limitations Difficulty walking, impaired transfers/functional mobility, chronic back DJD and  knee  OA pain , chronic leg swelling decreased strength, impaired balance, impaired sensation (neuropathy in feet), increased infection risk, increased fall risk,    Repetition Increases Symptoms    Special Tests + Stemmer bilaterally    Patient Stated Goals get swelling back down and replace worn compression stockings to limit LE progression    Pain Onset Other (comment)   chronic                         OT Treatments/Exercises (OP) - 06/12/21 1436       ADLs   ADL Education Given Yes      Manual Therapy   Manual Therapy Edema management    Manual Lymphatic Drainage (MLD) MLD to LLE/LLQ with simultaneous skin care to increase skin flexibility and to increase hydration. Utilized modified sjort neck sequence  omitting lateral neck strokes and performing J strokes to clavicular regions only. Pt instructed in diaphragmatic breathing during MLD. Utilized functional inguinal LN and deep adominal pathways working from proximal to distal through functional inguinal LN/. No c/o pain    throughout Rx.    Compression Bandaging LLE gradient compression wrraps using  one 8 and 10 cm wide short stretch wrap, with 2 12 cm wide wraps in gradient pattern over single layer stocinett and 0.4 cm thick rosidal foam.  OT Education - 06/12/21 1437     Education Details Continued skilled Pt/caregiver education  And LE ADL training throughout visit for lymphedema self care/ home program, including compression wrapping, compression garment and device wear/care, lymphatic pumping ther ex, simple self-MLD, and skin care. Pt specificaly reminded not to leave gradient compression wraps applied in clinic in place for more than 48 hours, at the most, to limit risk of injury to skin and/ or infection risk.    Person(s) Educated Patient    Methods Explanation;Demonstration    Comprehension Verbalized understanding;Need further instruction;Returned demonstration                  OT Long Term Goals - 05/16/21 1115       OT LONG TERM GOAL #1   Title Pt will demonstrate understanding of lymphedema precautions and signs/ symptoms of cellulitisby identifying 6 items for each using printed Lymphedema Workbook for reference PRN (modified independence) to limit LE progression and infection risk.    Baseline minA    Time 4    Period Days    Status Achieved      OT LONG TERM GOAL #2   Title Pt will require Max assist from caregiver who will be able to apply multilayer, gradient compression wraps using correct techniques after skilled training to reduce limb volume and limity LE progression.    Baseline dependent    Time 4    Period Days    Status Achieved      OT LONG TERM GOAL #3   Title Pt will achieve 10% limb volume reductions  bilaterally from ankle to tibial tuberosity during Intensive Phase CDT to return limbs to typical size and shape ,. reduce infection risk and improve basic andf instrumental ALDs performance.    Baseline intake volume RLE A-D= 9886.0 ml. intake volume LLE A-D= 10309.9 ml.   Achieved for RLE w/ 20.63% reduction on visit 20, 05/16/21.   Time 12    Period Weeks    Status Partially Met   R leg reduction from ankle to knee (A-D) = 0.005% on  04/04/21. Since commencing OT for CDT RLE limb volume, altho reduced slightly, is essentially unchanged on the 9th visit.   Target Date 06/07/21      OT LONG TERM GOAL #4   Title Pt will consistently perform all LE self -care home protocols ( modified simple self MLD, skin care, therapeutic exercise, compression therapies) daily  with max CG assistance to to achieve max edema reduction, to enanble functional improvements,  including fitting preferred street shoes and standard sized clothing, and to achieve increased AROM and reduced falls risk.    Baseline Max A    Time 12    Period Weeks    Status On-going   Basic sequential pump trial failed as significant LE symptoms persist. Trial of advanced  Flexitouch device was recently completed and results of insurance benefits is pending.   Target Date 06/07/21      OT LONG TERM GOAL #5   Status Deferred   goal discharged.Pt has not pursued this recommendation                  Plan - 06/11/21 1435     Clinical Impression Statement Provided RLE/RLQ MLD utilizing functional inguinal LN as established.. Provided skin care throughout MLD with low ph castor oil to increase skin mobility and hydration. Reapplied multilayer gradient compression wraps as established. Good tolerance for manual therapy without pain. Cont as per  POC.    OT Occupational Profile and History Comprehensive Assessment- Review of records and extensive additional review of physical, cognitive, psychosocial history related to current functional performance    Occupational performance deficits (Please refer to evaluation for details): ADL's;Work;IADL's;Rest and Sleep;Leisure;Social Participation;Other    Body Structure / Function / Physical Skills ADL;Skin integrity;Endurance;Balance;Mobility;Strength;Edema;Obesity;Pain;Gait;ROM;Decreased knowledge of precautions;Decreased knowledge of use of DME;IADL    Clinical Decision Making Multiple treatment options, significant modification of task necessary    Comorbidities Affecting Occupational Performance: Presence of comorbidities impacting occupational performance    Modification or Assistance to Complete Evaluation  Max significant modification of tasks or assist is necessary to complete    OT Frequency 2x / week    OT Duration 12 weeks    OT Treatment/Interventions Self-care/ADL training;Therapeutic exercise;Coping strategies training;Therapeutic activities;Manual lymph drainage;Energy conservation;Manual Therapy;DME and/or AE instruction;Compression bandaging;Patient/family education    Plan CDT to BLE, one leg at a time, RL:E first    OT Home Exercise Plan fit with BLE custom flat knit compression knee highs     Recommended Other Services Lymphedema management has a high burden of care, especially for Patients who have difficulty, or who are unable to reach their distal legs and feet.  reach their feet and distal legs to aapply bandages, compression garments, to bathe , inspect skin and groom nails. In this case because Pt is unable to perform these activities, daily caregiver assistance with the lymphedema self-care home program  between OT visits is essential for achieving clinical success. Without ongoing, daily caregiver assistance during compression bandaging, donning and doffing compression garments, skin care, and MLD , Pt's prognosis is poor. However, with consistent caregiver assistance with lymphedema home program prognosis for improved lymphedema control and reducing infection risk and progression is good.    Consulted and Agree with Plan of Care Patient             Patient will benefit from skilled therapeutic intervention in order to improve the following deficits and impairments:   Body Structure / Function / Physical Skills: ADL, Skin integrity, Endurance, Balance, Mobility, Strength, Edema, Obesity, Pain, Gait, ROM, Decreased knowledge of precautions, Decreased knowledge of use of DME, IADL       Visit Diagnosis: Lymphedema, not elsewhere classified    Problem List Patient Active Problem List   Diagnosis Date Noted   Osteoarthritis 10/04/2019   Spondylodiscitis 04/26/2019   Lymphedema 04/26/2019   Morbid obesity ([place]) 04/26/2019   BMI 45.0-49.9, adult ([place]) 08/20/2018   Controlled type 2 diabetes mellitus without complication, without long-term current use of insulin ([place]) 11/26/2017   GERD (gastroesophageal reflux disease) 11/26/2017   Hyperlipidemia, mixed 11/26/2017   Hypertension, essential 11/26/2017   Chest pain at rest 11/26/2017    Andrey Spearman, MS, OTR/L, [facility] 06/12/21 2:38 PM   [place] MAIN [facility] SERVICES [place], [place], [address] Phone: [phone]   Fax:  [phone]  Name: ADDISSON FRATE MRN: [id number] Date of Birth: 04-30-1956

## 2021-06-13 ENCOUNTER — Other Ambulatory Visit: Payer: Self-pay

## 2021-06-13 ENCOUNTER — Ambulatory Visit: Payer: Medicare PPO | Admitting: Occupational Therapy

## 2021-06-13 DIAGNOSIS — I890 Lymphedema, not elsewhere classified: Secondary | ICD-10-CM | POA: Diagnosis not present

## 2021-06-13 NOTE — Patient Instructions (Signed)

## 2021-06-13 NOTE — Therapy (Signed)
[place] MAIN [facility] SERVICES [address] [place], [place], [address] Phone: [phone]   Fax:  [phone]  Occupational Therapy Treatment  Patient Details  Name: Nancy Riley MRN: [id number] Date of Birth: March 04, 1956 Referring Provider (OT): Eulogio Ditch, NP   Encounter Date: 06/13/2021   OT End of Session - 06/13/21 1411     Visit Number 26    Number of Visits 36    Date for OT Re-Evaluation 09/05/21    OT Start Time 0205    OT Stop Time 0305    OT Time Calculation (min) 60 min    Activity Tolerance Patient tolerated treatment well;No increased pain;Patient limited by pain    Behavior During Therapy [facility] for tasks assessed/performed             Past Medical History:  Diagnosis Date   Diabetes mellitus without complication ([place])    DJD (degenerative joint disease)    GERD (gastroesophageal reflux disease)    Hypercholesteremia    Hypertension    Morbid obesity with BMI of 50.0-59.9, adult ([place])    Neuropathy    Phlegmon    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   Subjective Assessment - 06/13/21 1413     Subjective  Nancy Riley presents for OT Rx visit 25/36 to address BLE lymphedema. Pt arrives seated in a transport wc with new RLE compression knee high in place and LLE wraps in place. Pt denies le related pain. Pt has no new concerns today. "I don't want to sit here and complain about my pain when other people don't even have a leg."    Pertinent History Spondylodiscitis, BLE lymphedema, HTN, DM, neuropathy, DJD, arthritis, sciatica,  chronic back pain, morbid obesity, (BMI 50-59.9)    Limitations Difficulty walking, impaired  transfers/functional mobility, chronic back DJD and knee  OA pain , chronic leg swelling decreased strength, impaired balance, impaired sensation (neuropathy in feet), increased infection risk, increased fall risk,    Repetition Increases Symptoms    Special Tests + Stemmer bilaterally    Patient Stated Goals get swelling back down and replace worn compression stockings to limit LE progression    Currently in Pain? No/denies    Pain Onset Other (comment)   chronic                         OT Treatments/Exercises (OP) - 06/13/21 1417       ADLs   ADL Education Given Yes      Manual Therapy   Manual Therapy Edema management    Manual Lymphatic Drainage (MLD) MLD to LLE/LLQ with simultaneous skin care to increase skin flexibility and to increase hydration. Utilized modified sjort neck sequence  omitting lateral neck strokes and performing J strokes to clavicular regions only. Pt instructed in diaphragmatic breathing during MLD. Utilized functional inguinal LN and deep adominal pathways working from proximal to distal through functional inguinal LN/. No c/o pain    throughout Rx.    Compression Bandaging LLE gradient compression wrraps using  one 8 and 10 cm wide short stretch  wrap, with 2 12 cm wide wraps in gradient pattern over single layer stocinett and 0.4 cm thick rosidal foam.                    OT Education - 06/13/21 1418     Education Details Continued skilled Pt/caregiver education  And LE ADL training throughout visit for lymphedema self care/ home program, including compression wrapping, compression garment and device wear/care, lymphatic pumping ther ex, simple self-MLD, and skin care. Pt specificaly reminded not to leave gradient compression wraps applied in clinic in place for more than 48 hours, at the most, to limit risk of injury to skin and/ or infection risk.    Person(s) Educated Patient    Methods Explanation;Demonstration    Comprehension  Verbalized understanding;Need further instruction;Returned demonstration                 OT Long Term Goals - 05/16/21 1115       OT LONG TERM GOAL #1   Title Pt will demonstrate understanding of lymphedema precautions and signs/ symptoms of cellulitisby identifying 6 items for each using printed Lymphedema Workbook for reference PRN (modified independence) to limit LE progression and infection risk.    Baseline minA    Time 4    Period Days    Status Achieved      OT LONG TERM GOAL #2   Title Pt will require Max assist from caregiver who will be able to apply multilayer, gradient compression wraps using correct techniques after skilled training to reduce limb volume and limity LE progression.    Baseline dependent    Time 4    Period Days    Status Achieved      OT LONG TERM GOAL #3   Title Pt will achieve 10% limb volume reductions  bilaterally from ankle to tibial tuberosity during Intensive Phase CDT to return limbs to typical size and shape ,. reduce infection risk and improve basic andf instrumental ALDs performance.    Baseline intake volume RLE A-D= 9886.0 ml. intake volume LLE A-D= 10309.9 ml.   Achieved for RLE w/ 20.63% reduction on visit 20, 05/16/21.   Time 12    Period Weeks    Status Partially Met   R leg reduction from ankle to knee (A-D) = 0.005% on  04/04/21. Since commencing OT for CDT RLE limb volume, altho reduced slightly, is essentially unchanged on the 9th visit.   Target Date 06/07/21      OT LONG TERM GOAL #4   Title Pt will consistently perform all LE self -care home protocols ( modified simple self MLD, skin care, therapeutic exercise, compression therapies) daily  with max CG assistance to to achieve max edema reduction, to enanble functional improvements,  including fitting preferred street shoes and standard sized clothing, and to achieve increased AROM and reduced falls risk.    Baseline Max A    Time 12    Period Weeks    Status On-going    Basic sequential pump trial failed as significant LE symptoms persist. Trial of advanced Flexitouch device was recently completed and results of insurance benefits is pending.   Target Date 06/07/21      OT LONG TERM GOAL #5   Status Deferred   goal discharged.Pt has not pursued this recommendation                  Plan - 06/13/21 1609     Clinical Impression Statement Provided RLE/RLQ MLD  utilizing functional LN in seated position w leg elevated  . Provided skin care throughout MLD with low ph castor oil to increase skin mobility and hydration. Reapplied multilayer gradient compression wraps as established. Good tolerance for manual therapy without pain. Cont as per POC. Measure for custom LLE compression garment ASAP.    OT Occupational Profile and History Comprehensive Assessment- Review of records and extensive additional review of physical, cognitive, psychosocial history related to current functional performance    Occupational performance deficits (Please refer to evaluation for details): ADL's;Work;IADL's;Rest and Sleep;Leisure;Social Participation;Other    Body Structure / Function / Physical Skills ADL;Skin integrity;Endurance;Balance;Mobility;Strength;Edema;Obesity;Pain;Gait;ROM;Decreased knowledge of precautions;Decreased knowledge of use of DME;IADL    Clinical Decision Making Multiple treatment options, significant modification of task necessary    Comorbidities Affecting Occupational Performance: Presence of comorbidities impacting occupational performance    Modification or Assistance to Complete Evaluation  Max significant modification of tasks or assist is necessary to complete    OT Frequency 2x / week    OT Duration 12 weeks    OT Treatment/Interventions Self-care/ADL training;Therapeutic exercise;Coping strategies training;Therapeutic activities;Manual lymph drainage;Energy conservation;Manual Therapy;DME and/or AE instruction;Compression bandaging;Patient/family  education    Plan CDT to BLE, one leg at a time, RL:E first    OT Home Exercise Plan fit with BLE custom flat knit compression knee highs    Recommended Other Services Lymphedema management has a high burden of care, especially for Patients who have difficulty, or who are unable to reach their distal legs and feet.  reach their feet and distal legs to aapply bandages, compression garments, to bathe , inspect skin and groom nails. In this case because Pt is unable to perform these activities, daily caregiver assistance with the lymphedema self-care home program  between OT visits is essential for achieving clinical success. Without ongoing, daily caregiver assistance during compression bandaging, donning and doffing compression garments, skin care, and MLD , Pt's prognosis is poor. However, with consistent caregiver assistance with lymphedema home program prognosis for improved lymphedema control and reducing infection risk and progression is good.    Consulted and Agree with Plan of Care Patient             Patient will benefit from skilled therapeutic intervention in order to improve the following deficits and impairments:   Body Structure / Function / Physical Skills: ADL, Skin integrity, Endurance, Balance, Mobility, Strength, Edema, Obesity, Pain, Gait, ROM, Decreased knowledge of precautions, Decreased knowledge of use of DME, IADL       Visit Diagnosis: Lymphedema, not elsewhere classified    Problem List Patient Active Problem List   Diagnosis Date Noted   Osteoarthritis 10/04/2019   Spondylodiscitis 04/26/2019   Lymphedema 04/26/2019   Morbid obesity ([place]) 04/26/2019   BMI 45.0-49.9, adult ([place]) 08/20/2018   Controlled type 2 diabetes mellitus without complication, without long-term current use of insulin ([place]) 11/26/2017   GERD (gastroesophageal reflux disease) 11/26/2017   Hyperlipidemia, mixed 11/26/2017   Hypertension, essential 11/26/2017   Chest pain at rest  11/26/2017  Andrey Spearman, Nancy, OTR/L, CLT-LANA 06/13/21 4:11 PM   [place] MAIN [facility] SERVICES [place], [place], [address] Phone: [phone]   Fax:  [phone]  Name: Nancy Riley MRN: [id number] Date of Birth: Jun 29, 1955

## 2021-06-18 ENCOUNTER — Ambulatory Visit: Payer: Medicare PPO | Admitting: Occupational Therapy

## 2021-06-20 ENCOUNTER — Ambulatory Visit: Payer: Medicare PPO | Admitting: Occupational Therapy

## 2021-06-20 ENCOUNTER — Other Ambulatory Visit: Payer: Self-pay

## 2021-06-20 DIAGNOSIS — I890 Lymphedema, not elsewhere classified: Secondary | ICD-10-CM | POA: Diagnosis not present

## 2021-06-21 NOTE — Patient Instructions (Signed)

## 2021-06-21 NOTE — Therapy (Signed)
[place] MAIN [facility] SERVICES [address] [place], [place], [address] Phone: [phone]   Fax:  [phone]  Occupational Therapy Treatment  Patient Details  Name: Nancy Riley MRN: [id number] Date of Birth: March 18, 1956 Referring Provider (OT): Eulogio Ditch, NP   Encounter Date: 06/20/2021   OT End of Session - 06/20/21 1000     Visit Number 27    Number of Visits 36    Date for OT Re-Evaluation 09/05/21    OT Start Time 1001    OT Stop Time 1101    OT Time Calculation (min) 60 min    Activity Tolerance Patient tolerated treatment well;No increased pain;Patient limited by pain    Behavior During Therapy [facility] for tasks assessed/performed             Past Medical History:  Diagnosis Date   Diabetes mellitus without complication (Nancy Riley)    DJD (degenerative joint disease)    GERD (gastroesophageal reflux disease)    Hypercholesteremia    Hypertension    Morbid obesity with BMI of 50.0-59.9, adult (Nancy Riley)    Neuropathy    Phlegmon    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   Subjective Assessment - 06/20/21 1010     Subjective  [name] presents for OT Rx visit 26/36 to address BLE lymphedema. Pt arrives seated in a transport wc with new RLE compression knee high in place and LLE wraps in place. Pt reports 6-7/10 bilateral knee pain.    Pertinent History Spondylodiscitis, BLE lymphedema, HTN, DM, neuropathy, DJD, arthritis, sciatica,  chronic back pain, morbid obesity, (BMI 50-59.9)    Limitations Difficulty walking, impaired transfers/functional mobility, chronic back DJD and knee  OA pain , chronic leg swelling decreased strength,  impaired balance, impaired sensation (neuropathy in feet), increased infection risk, increased fall risk,    Repetition Increases Symptoms    Special Tests + Stemmer bilaterally    Patient Stated Goals get swelling back down and replace worn compression stockings to limit LE progression    Pain Onset Other (comment)   chronic                         OT Treatments/Exercises (OP) - 06/21/21 0830       ADLs   ADL Education Given Yes      Manual Therapy   Manual Therapy Edema management    Manual Lymphatic Drainage (MLD) MLD to LLE/LLQ with simultaneous skin care to increase skin flexibility and to increase hydration. Utilized modified sjort neck sequence  omitting lateral neck strokes and performing J strokes to clavicular regions only. Pt instructed in diaphragmatic breathing during MLD. Utilized functional inguinal LN and deep adominal pathways working from proximal to distal through functional inguinal LN/. No c/o pain    throughout Rx.    Compression Bandaging LLE gradient compression wrraps using  one 8 and 10 cm wide short stretch wrap, with 2 12 cm wide wraps in gradient pattern over single layer stocinett and 0.4 cm thick rosidal foam.  OT Education - 06/21/21 0831     Education Details Continued skilled Pt/caregiver education  And LE ADL training throughout visit for lymphedema self care/ home program, including compression wrapping, compression garment and device wear/care, lymphatic pumping ther ex, simple self-MLD, and skin care. Pt specificaly reminded not to leave gradient compression wraps applied in clinic in place for more than 48 hours, at the most, to limit risk of injury to skin and/ or infection risk.    Person(s) Educated Patient    Methods Explanation;Demonstration    Comprehension Verbalized understanding;Need further instruction;Returned demonstration                 OT Long Term Goals - 05/16/21 1115       OT  LONG TERM GOAL #1   Title Pt will demonstrate understanding of lymphedema precautions and signs/ symptoms of cellulitisby identifying 6 items for each using printed Lymphedema Workbook for reference PRN (modified independence) to limit LE progression and infection risk.    Baseline minA    Time 4    Period Days    Status Achieved      OT LONG TERM GOAL #2   Title Pt will require Max assist from caregiver who will be able to apply multilayer, gradient compression wraps using correct techniques after skilled training to reduce limb volume and limity LE progression.    Baseline dependent    Time 4    Period Days    Status Achieved      OT LONG TERM GOAL #3   Title Pt will achieve 10% limb volume reductions  bilaterally from ankle to tibial tuberosity during Intensive Phase CDT to return limbs to typical size and shape ,. reduce infection risk and improve basic andf instrumental ALDs performance.    Baseline intake volume RLE A-D= 9886.0 ml. intake volume LLE A-D= 10309.9 ml.   Achieved for RLE w/ 20.63% reduction on visit 20, 05/16/21.   Time 12    Period Weeks    Status Partially Met   R leg reduction from ankle to knee (A-D) = 0.005% on  04/04/21. Since commencing OT for CDT RLE limb volume, altho reduced slightly, is essentially unchanged on the 9th visit.   Target Date 06/07/21      OT LONG TERM GOAL #4   Title Pt will consistently perform all LE self -care home protocols ( modified simple self MLD, skin care, therapeutic exercise, compression therapies) daily  with max CG assistance to to achieve max edema reduction, to enanble functional improvements,  including fitting preferred street shoes and standard sized clothing, and to achieve increased AROM and reduced falls risk.    Baseline Max A    Time 12    Period Weeks    Status On-going   Basic sequential pump trial failed as significant LE symptoms persist. Trial of advanced Flexitouch device was recently completed and results of  insurance benefits is pending.   Target Date 06/07/21      OT LONG TERM GOAL #5   Status Deferred   goal discharged.Pt has not pursued this recommendation                  Plan - 06/20/21 1009     Clinical Impression Statement Pt donning and doffing RLE custom garment with extra time. Provided online resources today for Juzo friction pad donning? doffing aid as she tried this device in clinic and itr worked well for her. Pt is unable to reach her feet to  don/doff garments, so assistive devices are essential for compliance. Provided RLE/RLQ MLD utilizing functional LN in seated position w leg elevated  . Provided skin care throughout MLD with low ph castor oil to increase skin mobility and hydration. Reapplied multilayer gradient compression wraps to LLE as established. Good tolerance for manual therapy without pain. Cont as per POC. Measure for custom LLE compression garment ASAP. LLE more swollen today than last visit, so we delayed that today.    OT Occupational Profile and History Comprehensive Assessment- Review of records and extensive additional review of physical, cognitive, psychosocial history related to current functional performance    Occupational performance deficits (Please refer to evaluation for details): ADL's;Work;IADL's;Rest and Sleep;Leisure;Social Participation;Other    Body Structure / Function / Physical Skills ADL;Skin integrity;Endurance;Balance;Mobility;Strength;Edema;Obesity;Pain;Gait;ROM;Decreased knowledge of precautions;Decreased knowledge of use of DME;IADL    Clinical Decision Making Multiple treatment options, significant modification of task necessary    Comorbidities Affecting Occupational Performance: Presence of comorbidities impacting occupational performance    Modification or Assistance to Complete Evaluation  Max significant modification of tasks or assist is necessary to complete    OT Frequency 2x / week    OT Duration 12 weeks    OT  Treatment/Interventions Self-care/ADL training;Therapeutic exercise;Coping strategies training;Therapeutic activities;Manual lymph drainage;Energy conservation;Manual Therapy;DME and/or AE instruction;Compression bandaging;Patient/family education    Plan CDT to BLE, one leg at a time, RL:E first    OT Home Exercise Plan fit with BLE custom flat knit compression knee highs    Recommended Other Services Lymphedema management has a high burden of care, especially for Patients who have difficulty, or who are unable to reach their distal legs and feet.  reach their feet and distal legs to aapply bandages, compression garments, to bathe , inspect skin and groom nails. In this case because Pt is unable to perform these activities, daily caregiver assistance with the lymphedema self-care home program  between OT visits is essential for achieving clinical success. Without ongoing, daily caregiver assistance during compression bandaging, donning and doffing compression garments, skin care, and MLD , Pt's prognosis is poor. However, with consistent caregiver assistance with lymphedema home program prognosis for improved lymphedema control and reducing infection risk and progression is good.    Consulted and Agree with Plan of Care Patient             Patient will benefit from skilled therapeutic intervention in order to improve the following deficits and impairments:   Body Structure / Function / Physical Skills: ADL, Skin integrity, Endurance, Balance, Mobility, Strength, Edema, Obesity, Pain, Gait, ROM, Decreased knowledge of precautions, Decreased knowledge of use of DME, IADL       Visit Diagnosis: Lymphedema, not elsewhere classified    Problem List Patient Active Problem List   Diagnosis Date Noted   Osteoarthritis 10/04/2019   Spondylodiscitis 04/26/2019   Lymphedema 04/26/2019   Morbid obesity ([place]) 04/26/2019   BMI 45.0-49.9, adult ([place]) 08/20/2018   Controlled type 2 diabetes mellitus  without complication, without long-term current use of insulin ([place]) 11/26/2017   GERD (gastroesophageal reflux disease) 11/26/2017   Hyperlipidemia, mixed 11/26/2017   Hypertension, essential 11/26/2017   Chest pain at rest 11/26/2017    Andrey Spearman, MS, OTR/L, [facility] 06/21/21 8:32 AM   [place] MAIN [facility] SERVICES [place], [place], [address] Phone: [phone]   Fax:  [phone]  Name: TAMYRA FOJTIK MRN: [id number] Date of Birth: 1955/11/28

## 2021-06-25 ENCOUNTER — Other Ambulatory Visit: Payer: Self-pay

## 2021-06-25 ENCOUNTER — Ambulatory Visit: Payer: Medicare PPO | Admitting: Occupational Therapy

## 2021-06-25 DIAGNOSIS — I890 Lymphedema, not elsewhere classified: Secondary | ICD-10-CM

## 2021-06-25 NOTE — Therapy (Signed)
[place] MAIN [facility] SERVICES [address] [place], [place], [address] Phone: [phone]   Fax:  [phone]  Occupational Therapy Treatment  Patient Details  Name: Nancy Riley MRN: [id number] Date of Birth: [birth year] Referring Provider (OT): Nancy Ditch, NP   Encounter Date: 06/25/2021   OT End of Session - 06/25/21 0911     Visit Number 28    Number of Visits 36    Date for OT Re-Evaluation 09/05/21    OT Start Time 0907    OT Stop Time 1007    OT Time Calculation (min) 60 min    Activity Tolerance Patient tolerated treatment well;No increased pain;Patient limited by pain    Behavior During Therapy [facility] for tasks assessed/performed             Past Medical History:  Diagnosis Date   Diabetes mellitus without complication ([place])    DJD (degenerative joint disease)    GERD (gastroesophageal reflux disease)    Hypercholesteremia    Hypertension    Morbid obesity with BMI of 50.0-59.9, adult ([place])    Neuropathy    Phlegmon    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   Subjective Assessment - 06/25/21 0912     Subjective  Nancy Riley presents for OT Rx visit 27/36 to address BLE lymphedema. Pt arrives seated in a transport wc with new RLE compression knee high in place and LLE wraps in place. Pt reports 5/10 bilateral knee pain.    Pertinent History Spondylodiscitis, BLE lymphedema, HTN, DM, neuropathy, DJD, arthritis, sciatica,  chronic back pain, morbid obesity, (BMI 50-59.9)    Limitations Difficulty walking, impaired transfers/functional mobility, chronic back DJD and knee  OA pain , chronic leg swelling decreased strength, impaired  balance, impaired sensation (neuropathy in feet), increased infection risk, increased fall risk,    Repetition Increases Symptoms    Special Tests + Stemmer bilaterally    Patient Stated Goals get swelling back down and replace worn compression stockings to limit LE progression    Pain Onset Other (comment)   chronic                         OT Treatments/Exercises (OP) - 06/25/21 1055       ADLs   ADL Education Given Yes      Manual Therapy   Manual Therapy Edema management    Manual Lymphatic Drainage (MLD) MLD to LLE/LLQ with simultaneous skin care to increase skin flexibility and to increase hydration. Utilized modified sjort neck sequence  omitting lateral neck strokes and performing J strokes to clavicular regions only. Pt instructed in diaphragmatic breathing during MLD. Utilized functional inguinal LN and deep adominal pathways working from proximal to distal through functional inguinal LN/. No c/o pain    throughout Rx.    Compression Bandaging LLE gradient compression wrraps using  one 8 and 10 cm wide short stretch wrap, with 2 12 cm wide wraps in gradient pattern over single layer stocinett and 0.4 cm thick rosidal foam.  OT Education - 06/25/21 1056     Education Details Continued skilled Pt/caregiver education  And LE ADL training throughout visit for lymphedema self care/ home program, including compression wrapping, compression garment and device wear/care, lymphatic pumping ther ex, simple self-MLD, and skin care. Pt specificaly reminded not to leave gradient compression wraps applied in clinic in place for more than 48 hours, at the most, to limit risk of injury to skin and/ or infection risk.    Person(s) Educated Patient    Methods Explanation;Demonstration    Comprehension Verbalized understanding;Need further instruction;Returned demonstration                 OT Long Term Goals - 05/16/21 1115       OT LONG TERM  GOAL #1   Title Pt will demonstrate understanding of lymphedema precautions and signs/ symptoms of cellulitisby identifying 6 items for each using printed Lymphedema Workbook for reference PRN (modified independence) to limit LE progression and infection risk.    Baseline minA    Time 4    Period Days    Status Achieved      OT LONG TERM GOAL #2   Title Pt will require Max assist from caregiver who will be able to apply multilayer, gradient compression wraps using correct techniques after skilled training to reduce limb volume and limity LE progression.    Baseline dependent    Time 4    Period Days    Status Achieved      OT LONG TERM GOAL #3   Title Pt will achieve 10% limb volume reductions  bilaterally from ankle to tibial tuberosity during Intensive Phase CDT to return limbs to typical size and shape ,. reduce infection risk and improve basic andf instrumental ALDs performance.    Baseline intake volume RLE A-D= 9886.0 ml. intake volume LLE A-D= 10309.9 ml.   Achieved for RLE w/ 20.63% reduction on visit 20, 05/16/21.   Time 12    Period Weeks    Status Partially Met   R leg reduction from ankle to knee (A-D) = 0.005% on  04/04/21. Since commencing OT for CDT RLE limb volume, altho reduced slightly, is essentially unchanged on the 9th visit.   Target Date 06/07/21      OT LONG TERM GOAL #4   Title Pt will consistently perform all LE self -care home protocols ( modified simple self MLD, skin care, therapeutic exercise, compression therapies) daily  with max CG assistance to to achieve max edema reduction, to enanble functional improvements,  including fitting preferred street shoes and standard sized clothing, and to achieve increased AROM and reduced falls risk.    Baseline Max A    Time 12    Period Weeks    Status On-going   Basic sequential pump trial failed as significant LE symptoms persist. Trial of advanced Flexitouch device was recently completed and results of insurance  benefits is pending.   Target Date 06/07/21      OT LONG TERM GOAL #5   Status Deferred   goal discharged.Pt has not pursued this recommendation                  Plan - 06/25/21 1054     Clinical Impression Statement Provided RLE/RLQ MLD utilizing functional inguinal LN as established.. Provided skin care throughout MLD with low ph castor oil to increase skin mobility and hydration. Reapplied multilayer gradient compression wraps as established. Good tolerance for manual therapy without pain. Cont as per  POC.    OT Occupational Profile and History Comprehensive Assessment- Review of records and extensive additional review of physical, cognitive, psychosocial history related to current functional performance    Occupational performance deficits (Please refer to evaluation for details): ADL's;Work;IADL's;Rest and Sleep;Leisure;Social Participation;Other    Body Structure / Function / Physical Skills ADL;Skin integrity;Endurance;Balance;Mobility;Strength;Edema;Obesity;Pain;Gait;ROM;Decreased knowledge of precautions;Decreased knowledge of use of DME;IADL    Clinical Decision Making Multiple treatment options, significant modification of task necessary    Comorbidities Affecting Occupational Performance: Presence of comorbidities impacting occupational performance    Modification or Assistance to Complete Evaluation  Max significant modification of tasks or assist is necessary to complete    OT Frequency 2x / week    OT Duration 12 weeks    OT Treatment/Interventions Self-care/ADL training;Therapeutic exercise;Coping strategies training;Therapeutic activities;Manual lymph drainage;Energy conservation;Manual Therapy;DME and/or AE instruction;Compression bandaging;Patient/family education    Plan CDT to BLE, one leg at a time, RL:E first    OT Home Exercise Plan fit with BLE custom flat knit compression knee highs    Recommended Other Services Lymphedema management has a high burden of care,  especially for Patients who have difficulty, or who are unable to reach their distal legs and feet.  reach their feet and distal legs to aapply bandages, compression garments, to bathe , inspect skin and groom nails. In this case because Pt is unable to perform these activities, daily caregiver assistance with the lymphedema self-care home program  between OT visits is essential for achieving clinical success. Without ongoing, daily caregiver assistance during compression bandaging, donning and doffing compression garments, skin care, and MLD , Pt's prognosis is poor. However, with consistent caregiver assistance with lymphedema home program prognosis for improved lymphedema control and reducing infection risk and progression is good.    Consulted and Agree with Plan of Care Patient             Patient will benefit from skilled therapeutic intervention in order to improve the following deficits and impairments:   Body Structure / Function / Physical Skills: ADL, Skin integrity, Endurance, Balance, Mobility, Strength, Edema, Obesity, Pain, Gait, ROM, Decreased knowledge of precautions, Decreased knowledge of use of DME, IADL       Visit Diagnosis: Lymphedema, not elsewhere classified    Problem List Patient Active Problem List   Diagnosis Date Noted   Osteoarthritis 10/04/2019   Spondylodiscitis 04/26/2019   Lymphedema 04/26/2019   Morbid obesity ([place]) 04/26/2019   BMI 45.0-49.9, adult ([place]) 08/20/2018   Controlled type 2 diabetes mellitus without complication, without long-term current use of insulin ([place]) 11/26/2017   GERD (gastroesophageal reflux disease) 11/26/2017   Hyperlipidemia, mixed 11/26/2017   Hypertension, essential 11/26/2017   Chest pain at rest 11/26/2017     Andrey Spearman, Nancy, OTR/L, [facility] 06/25/21 10:57 AM   [place], [place], [address] Phone: [phone]   Fax:   [phone]  Name: Nancy Riley MRN: [id number] Date of Birth: 22-Jan-1956

## 2021-06-25 NOTE — Patient Instructions (Signed)

## 2021-06-27 ENCOUNTER — Ambulatory Visit: Payer: Medicare PPO | Admitting: Occupational Therapy

## 2021-06-27 ENCOUNTER — Other Ambulatory Visit: Payer: Self-pay

## 2021-06-27 DIAGNOSIS — I890 Lymphedema, not elsewhere classified: Secondary | ICD-10-CM | POA: Diagnosis not present

## 2021-06-27 NOTE — Therapy (Addendum)
[place] MAIN [facility] SERVICES [address] [place], [place], [address] Phone: [phone]   Fax:  [phone]  Occupational Therapy Treatment  Patient Details  Name: Nancy Riley MRN: [id number] Date of Birth: 06-Apr-1956 Referring Provider (OT): Eulogio Ditch, NP   Encounter Date: 06/27/2021   OT End of Session - 06/27/21 1014     Visit Number 29    Number of Visits 36    Date for OT Re-Evaluation 09/05/21    OT Start Time 1005    OT Stop Time 1105    OT Time Calculation (min) 60 min    Activity Tolerance Patient tolerated treatment well;No increased pain;Patient limited by pain    Behavior During Therapy [facility] for tasks assessed/performed             Past Medical History:  Diagnosis Date   Diabetes mellitus without complication ([place])    DJD (degenerative joint disease)    GERD (gastroesophageal reflux disease)    Hypercholesteremia    Hypertension    Morbid obesity with BMI of 50.0-59.9, adult ([place])    Neuropathy    Phlegmon    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   Subjective Assessment - 06/27/21 1348     Subjective  Nancy Riley presents for OT Rx visit 29/36 to address BLE lymphedema. Pt arrives seated in a transport wc with new RLE compression knee high in place and LLE wraps in place. Pt reports 5/10 bilateral knee pain.    Pertinent History Spondylodiscitis, BLE lymphedema, HTN, DM, neuropathy, DJD, arthritis, sciatica,  chronic back pain, morbid obesity, (BMI 50-59.9)    Limitations Difficulty walking, impaired transfers/functional mobility, chronic back DJD and knee  OA pain , chronic leg swelling decreased strength, impaired  balance, impaired sensation (neuropathy in feet), increased infection risk, increased fall risk,    Repetition Increases Symptoms    Special Tests + Stemmer bilaterally    Patient Stated Goals get swelling back down and replace worn compression stockings to limit LE progression    Pain Onset Other (comment)   chronic                LYMPHEDEMA/ONCOLOGY QUESTIONNAIRE - 06/27/21 1349       Right Lower Extremity Lymphedema   Other CORRECTION FOR  03/12/21: RLE A-D limb volume measures 9886.0 ml.    Other CORRECTION FOR  03/12/21: RLE A-D limb volume is INCREASED by 38.13 % since last measured on 09/20/19.      Left Lower Extremity Lymphedema   Other LLE A-D volume =7392.4 ml.    Other LLE A-D limb volume is reduced by 28.3%. Goal met.                     OT Treatments/Exercises (OP) - 06/27/21 1348       ADLs   ADL Education Given Yes      Manual Therapy   Manual Therapy Edema management    Manual therapy comments skin care throughout LLE MLD    Edema Management L leg comparative limb volumetrics    Manual Lymphatic Drainage (MLD) MLD  to LLE/LLQ with simultaneous skin care to increase skin flexibility and to increase hydration. Utilized modified sjort neck sequence  omitting lateral neck strokes and performing J strokes to clavicular regions only. Pt instructed in diaphragmatic breathing during MLD. Utilized functional inguinal LN and deep adominal pathways working from proximal to distal through functional inguinal LN/. No c/o pain    throughout Rx.    Compression Bandaging LLE gradient compression wrraps using  one 8 and 10 cm wide short stretch wrap, with 2 12 cm wide wraps in gradient pattern over single layer stocinett and 0.4 cm thick rosidal foam.                    OT Education - 06/27/21 1351     Education Details Pt edu re outcome of LLE volumetrics. Reviewed progress towards all goals.    Person(s) Educated Patient    Methods  Explanation;Demonstration    Comprehension Verbalized understanding;Need further instruction;Returned demonstration                 OT Long Term Goals - 06/27/21 1352       OT LONG TERM GOAL #1   Title Pt will demonstrate understanding of lymphedema precautions and signs/ symptoms of cellulitisby identifying 6 items for each using printed Lymphedema Workbook for reference PRN (modified independence) to limit LE progression and infection risk.    Baseline minA    Time 4    Period Days    Status Achieved      OT LONG TERM GOAL #2   Title Pt will require Max assist from caregiver who will be able to apply multilayer, gradient compression wraps using correct techniques after skilled training to reduce limb volume and limity LE progression.    Baseline dependent    Time 4    Period Days    Status Achieved      OT LONG TERM GOAL #3   Title Pt will achieve 10% limb volume reductions  bilaterally from ankle to tibial tuberosity during Intensive Phase CDT to return limbs to typical size and shape ,. reduce infection risk and improve basic andf instrumental ALDs performance.    Baseline intake volume RLE A-D= 9886.0 ml. intake volume LLE A-D= 10309.9 ml.   Achieved for RLE w/ 20.63% reduction on visit 20, 05/16/21.   Time 12    Period Weeks    Status Achieved   R leg limb volume reduction to date measures 20.63%. L leg volume reductionh to date = 28.3%. Goal met bilaterally     OT LONG TERM GOAL #4   Title Pt will consistently perform all LE self -care home protocols ( modified simple self MLD, skin care, therapeutic exercise, compression therapies) daily  with max CG assistance to to achieve max edema reduction, to enanble functional improvements,  including fitting preferred street shoes and standard sized clothing, and to achieve increased AROM and reduced falls risk.    Baseline Max A    Time 12    Period Weeks    Status On-going   Basic sequential pump trial failed as significant  LE symptoms persist. Trial of advanced Flexitouch device was recently completed and results of insurance benefits is pending.   Target Date 06/07/21      OT LONG TERM GOAL #5   Status Deferred   goal discharged.Pt has not pursued this recommendation                  Plan - 06/27/21 1345  Clinical Impression Statement Pt presents with abdominal, buttock and BLE Lymphedema. A one time basic pump trial was failed on 04/09/21 due to pain. LLE limb bvolumetrics reveals Pt has achieved a 28.3% volume reduction  from ankle to tibial tuberosity  since cmmencing CDT. This value bmeets and exceeds 10% reduction goal. Reviewed all goals in reparation for progress report due next session. Provided RLE MLD with simultaneous skin care. Re-applied compression wraps as established. Cont as per OC.    OT Occupational Profile and History Comprehensive Assessment- Review of records and extensive additional review of physical, cognitive, psychosocial history related to current functional performance    Occupational performance deficits (Please refer to evaluation for details): ADL's;Work;IADL's;Rest and Sleep;Leisure;Social Participation;Other    Body Structure / Function / Physical Skills ADL;Skin integrity;Endurance;Balance;Mobility;Strength;Edema;Obesity;Pain;Gait;ROM;Decreased knowledge of precautions;Decreased knowledge of use of DME;IADL    Clinical Decision Making Multiple treatment options, significant modification of task necessary    Comorbidities Affecting Occupational Performance: Presence of comorbidities impacting occupational performance    Modification or Assistance to Complete Evaluation  Max significant modification of tasks or assist is necessary to complete    OT Frequency 2x / week    OT Duration 12 weeks    OT Treatment/Interventions Self-care/ADL training;Therapeutic exercise;Coping strategies training;Therapeutic activities;Manual lymph drainage;Energy conservation;Manual  Therapy;DME and/or AE instruction;Compression bandaging;Patient/family education    Plan CDT to BLE, one leg at a time, RL:E first    OT Home Exercise Plan fit with BLE custom flat knit compression knee highs    Recommended Other Services Lymphedema management has a high burden of care, especially for Patients who have difficulty, or who are unable to reach their distal legs and feet.  reach their feet and distal legs to aapply bandages, compression garments, to bathe , inspect skin and groom nails. In this case because Pt is unable to perform these activities, daily caregiver assistance with the lymphedema self-care home program  between OT visits is essential for achieving clinical success. Without ongoing, daily caregiver assistance during compression bandaging, donning and doffing compression garments, skin care, and MLD , Pt's prognosis is poor. However, with consistent caregiver assistance with lymphedema home program prognosis for improved lymphedema control and reducing infection risk and progression is good.    Consulted and Agree with Plan of Care Patient             Patient will benefit from skilled therapeutic intervention in order to improve the following deficits and impairments:   Body Structure / Function / Physical Skills: ADL, Skin integrity, Endurance, Balance, Mobility, Strength, Edema, Obesity, Pain, Gait, ROM, Decreased knowledge of precautions, Decreased knowledge of use of DME, IADL       Visit Diagnosis: Lymphedema, not elsewhere classified    Problem List Patient Active Problem List   Diagnosis Date Noted   Osteoarthritis 10/04/2019   Spondylodiscitis 04/26/2019   Lymphedema 04/26/2019   Morbid obesity ([place]) 04/26/2019   BMI 45.0-49.9, adult ([place]) 08/20/2018   Controlled type 2 diabetes mellitus without complication, without long-term current use of insulin ([place]) 11/26/2017   GERD (gastroesophageal reflux disease) 11/26/2017   Hyperlipidemia, mixed  11/26/2017   Hypertension, essential 11/26/2017   Chest pain at rest 11/26/2017   Andrey Spearman, Nancy, OTR/L, [facility] 06/27/21 1:54 PM   [place] MAIN [facility] SERVICES [place], [place], [address] Phone: [phone]   Fax:  [phone]  Name: Nancy Riley MRN: [id number] Date of Birth: November 02, 1955

## 2021-06-28 DIAGNOSIS — H18519 Endothelial corneal dystrophy, unspecified eye: Secondary | ICD-10-CM | POA: Insufficient documentation

## 2021-07-02 ENCOUNTER — Ambulatory Visit: Payer: Medicare PPO | Admitting: Occupational Therapy

## 2021-07-02 ENCOUNTER — Other Ambulatory Visit: Payer: Self-pay

## 2021-07-02 DIAGNOSIS — I890 Lymphedema, not elsewhere classified: Secondary | ICD-10-CM | POA: Diagnosis not present

## 2021-07-02 NOTE — Patient Instructions (Signed)

## 2021-07-02 NOTE — Therapy (Signed)
[place] MAIN [facility] SERVICES [address] [place], [place], [address] Phone: [phone]   Fax:  [phone]  Occupational Therapy Treatment Note and Progress Report: Lymphedema Care  Patient Details  Name: Nancy Riley MRN: [id number] Date of Birth: 02-May-1956 Referring Provider (OT): Eulogio Ditch, NP Reporting Period 05/16/21 - 07/02/21  Encounter Date: 07/02/2021   OT End of Session - 07/02/21 0913     Visit Number 30    Number of Visits 36    Date for OT Re-Evaluation 09/05/21    OT Start Time 0905    OT Stop Time 1005    OT Time Calculation (min) 60 min    Activity Tolerance Patient tolerated treatment well;No increased pain;Patient limited by pain    Behavior During Therapy [facility] for tasks assessed/performed             Past Medical History:  Diagnosis Date   Diabetes mellitus without complication ([place])    DJD (degenerative joint disease)    GERD (gastroesophageal reflux disease)    Hypercholesteremia    Hypertension    Morbid obesity with BMI of 50.0-59.9, adult ([place])    Neuropathy    Phlegmon    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   Subjective Assessment - 07/02/21 1206     Subjective  Ms Chui presents for OT Rx visit 30 /36 to address BLE lymphedema. Pt arrives seated in a transport wc with new RLE compression knee high in place and LLE wraps in place. Pt reports 4/10 bilateral knee pain. Pt reportsgood tolerance for compression wraps all weekend.    Pertinent History Spondylodiscitis, BLE lymphedema, HTN, DM, neuropathy, DJD, arthritis, sciatica,  chronic back pain, morbid obesity, (BMI 50-59.9)    Limitations  Difficulty walking, impaired transfers/functional mobility, chronic back DJD and knee  OA pain , chronic leg swelling decreased strength, impaired balance, impaired sensation (neuropathy in feet), increased infection risk, increased fall risk,    Repetition Increases Symptoms    Special Tests + Stemmer bilaterally    Patient Stated Goals get swelling back down and replace worn compression stockings to limit LE progression    Pain Onset Other (comment)   chronic                         OT Treatments/Exercises (OP) - 07/02/21 1207       ADLs   ADL Education Given Yes      Manual Therapy   Manual Therapy Edema management    Manual therapy comments skin care throughout LLE MLD    Manual Lymphatic Drainage (MLD) MLD to LLE/LLQ with simultaneous skin care to increase skin flexibility and to increase hydration. Utilized modified sjort neck sequence  omitting lateral neck strokes and performing J strokes to clavicular regions only. Pt instructed in diaphragmatic breathing during MLD. Utilized functional inguinal LN and deep adominal pathways working from proximal to distal through functional inguinal LN/. No c/o pain    throughout Rx.    Compression Bandaging LLE gradient compression wrraps using  one 8 and 10 cm wide short stretch wrap,  with 2 12 cm wide wraps in gradient pattern over single layer stocinett and 0.4 cm thick rosidal foam.                    OT Education - 07/02/21 1208     Education Details Continued skilled Pt/caregiver education  And LE ADL training throughout visit for lymphedema self care/ home program, including compression wrapping, compression garment and device wear/care, lymphatic pumping ther ex, simple self-MLD, and skin care. Discussed progress towards all goals todate.    Person(s) Educated Patient    Methods Explanation;Demonstration    Comprehension Verbalized understanding;Need further instruction;Returned demonstration                  OT Long Term Goals - 07/02/21 0916       OT LONG TERM GOAL #1   Title Pt will demonstrate understanding of lymphedema precautions and signs/ symptoms of cellulitisby identifying 6 items for each using printed Lymphedema Workbook for reference PRN (modified independence) to limit LE progression and infection risk.    Baseline minA    Time 4    Period Days    Status Achieved      OT LONG TERM GOAL #2   Title Pt will require Max assist from caregiver who will be able to apply multilayer, gradient compression wraps using correct techniques after skilled training to reduce limb volume and limity LE progression.    Baseline dependent    Period Days    Status Achieved      OT LONG TERM GOAL #3   Title Pt will achieve 10% limb volume reductions  bilaterally from ankle to tibial tuberosity during Intensive Phase CDT to return limbs to typical size and shape ,. reduce infection risk and improve basic andf instrumental ALDs performance.    Baseline intake volume RLE A-D= 9886.0 ml. intake volume LLE A-D= 10309.9 ml.   Achieved for RLE w/ 20.63% reduction on visit 20, 05/16/21.   Time 12    Period Weeks    Status Achieved   R leg limb volume reduction to date measures 20.63%. L leg volume reductionh to date = 28.3%. Goal met bilaterally     OT LONG TERM GOAL #4   Title Pt will consistently perform all LE self -care home protocols ( modified simple self MLD, skin care, therapeutic exercise, compression therapies) daily  with max CG assistance to to achieve max edema reduction, to enanble functional improvements,  including fitting preferred street shoes and standard sized clothing, and to achieve increased AROM and reduced falls risk.    Baseline Max A    Time 12    Period Weeks    Status Achieved   Pt unable to perform simple self-MLD due to limited AROM and pain. Basic sequential pump trial failed as significant LE symptoms persist. Trial of advanced Flexitouch device was recently  completed and results of insurance benefits is pending.   Target Date 06/07/21      OT LONG TERM GOAL #5   Title Pt will demonstrate an increase in functional score on the FOTO assessment (Funtional utcomes) of 5 points above intyake score to demonstrate improvement in functional mobility .    Baseline Intake FOTO= 32\100    Time 12    Period Weeks    Status Partially Met   Intake FOTO= 32\100. 12/19 value= 40; 06/27/21 value= 35   Target Date 09/05/21  Plan - 07/02/21 0914     Clinical Impression Statement Reviewed progress towards all OT goals with Pt during manual therapy. Comparative limb volumetrics reveal Pt has achieved a 28.3% volume reduction below the knee in the LLE and a 20.6% volume reduction below the knee on the R since commencing this course of CDT.  These values meets and exceed 10% reduction goals bilaterally.  Pt met and exceeds goal for 85% compliance with all home program components with Max a for compression wrapping. She is also now modified independedent with donning and doffing custom compression stocking  using assistive devices, which although not originally not a specific goal, is a very important functionally for LE self-management as Pt lives alone.  Pt has not met FOTO functional outcomes measure goal for a 5 Pt increase in score. She has made importantr gains in LE self care, but continues to have difficulty walking and with transfers . Please see LONG TERM GOALS section for additional details re OT goals. Pt tolerated MLD and skin care to LLE today without increased pain. Once L custom compression garment is delivered by vendor we will complete LLE fitting and assessment. In the meantime cont OT as per OC.    OT Occupational Profile and History Comprehensive Assessment- Review of records and extensive additional review of physical, cognitive, psychosocial history related to current functional performance    Occupational performance deficits  (Please refer to evaluation for details): ADL's;Work;IADL's;Rest and Sleep;Leisure;Social Participation;Other    Body Structure / Function / Physical Skills ADL;Skin integrity;Endurance;Balance;Mobility;Strength;Edema;Obesity;Pain;Gait;ROM;Decreased knowledge of precautions;Decreased knowledge of use of DME;IADL    Clinical Decision Making Multiple treatment options, significant modification of task necessary    Comorbidities Affecting Occupational Performance: Presence of comorbidities impacting occupational performance    Modification or Assistance to Complete Evaluation  Max significant modification of tasks or assist is necessary to complete    OT Frequency 2x / week    OT Duration 12 weeks    OT Treatment/Interventions Self-care/ADL training;Therapeutic exercise;Coping strategies training;Therapeutic activities;Manual lymph drainage;Energy conservation;Manual Therapy;DME and/or AE instruction;Compression bandaging;Patient/family education    Plan CDT to BLE, one leg at a time, RL:E first    OT Home Exercise Plan fit with BLE custom flat knit compression knee highs    Recommended Other Services Lymphedema management has a high burden of care, especially for Patients who have difficulty, or who are unable to reach their distal legs and feet.  reach their feet and distal legs to aapply bandages, compression garments, to bathe , inspect skin and groom nails. In this case because Pt is unable to perform these activities, daily caregiver assistance with the lymphedema self-care home program  between OT visits is essential for achieving clinical success. Without ongoing, daily caregiver assistance during compression bandaging, donning and doffing compression garments, skin care, and MLD , Pt's prognosis is poor. However, with consistent caregiver assistance with lymphedema home program prognosis for improved lymphedema control and reducing infection risk and progression is good.    Consulted and Agree with  Plan of Care Patient             Patient will benefit from skilled therapeutic intervention in order to improve the following deficits and impairments:   Body Structure / Function / Physical Skills: ADL, Skin integrity, Endurance, Balance, Mobility, Strength, Edema, Obesity, Pain, Gait, ROM, Decreased knowledge of precautions, Decreased knowledge of use of DME, IADL       Visit Diagnosis: Lymphedema, not elsewhere classified    Problem List Patient Active  Problem List   Diagnosis Date Noted   Osteoarthritis 10/04/2019   Spondylodiscitis 04/26/2019   Lymphedema 04/26/2019   Morbid obesity ([place]) 04/26/2019   BMI 45.0-49.9, adult ([place]) 08/20/2018   Controlled type 2 diabetes mellitus without complication, without long-term current use of insulin ([place]) 11/26/2017   GERD (gastroesophageal reflux disease) 11/26/2017   Hyperlipidemia, mixed 11/26/2017   Hypertension, essential 11/26/2017   Chest pain at rest 11/26/2017    Andrey Spearman, MS, OTR/L, [facility] 07/02/21 12:49 PM   [place] MAIN [facility] SERVICES [place], [place], [address] Phone: [phone]   Fax:  [phone]  Name: Nancy Riley MRN: [id number] Date of Birth: July 24, 1955

## 2021-07-04 ENCOUNTER — Ambulatory Visit: Payer: Medicare PPO | Attending: Nurse Practitioner | Admitting: Occupational Therapy

## 2021-07-04 ENCOUNTER — Other Ambulatory Visit: Payer: Self-pay

## 2021-07-04 DIAGNOSIS — I890 Lymphedema, not elsewhere classified: Secondary | ICD-10-CM | POA: Diagnosis not present

## 2021-07-04 NOTE — Therapy (Signed)
[place] MAIN [facility] SERVICES [address] [place], [place], [address] Phone: [phone]   Fax:  [phone]  Occupational Therapy Treatment  Patient Details  Name: Nancy Riley MRN: [id number] Date of Birth: Oct 26, 1955 Referring Provider (OT): Eulogio Ditch, NP   Encounter Date: 07/04/2021   OT End of Session - 07/04/21 1104     Visit Number 32    Number of Visits 36    Date for OT Re-Evaluation 09/05/21    OT Start Time 1100    Activity Tolerance Patient tolerated treatment well;No increased pain;Patient limited by pain    Behavior During Therapy [facility] for tasks assessed/performed             Past Medical History:  Diagnosis Date   Diabetes mellitus without complication ([place])    DJD (degenerative joint disease)    GERD (gastroesophageal reflux disease)    Hypercholesteremia    Hypertension    Morbid obesity with BMI of 50.0-59.9, adult ([place])    Neuropathy    Phlegmon    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   Subjective Assessment - 07/04/21 1010     Subjective  Nancy Riley presents for OT Rx visit 73 /36 to address BLE lymphedema. Pt arrives seated in a transport wc with new RLE compression knee high in place and LLE wraps in place. Pt reports 4/10 bilateral knee pain. Pt denies LE related questions or concerns this morning.    Pertinent History Spondylodiscitis, BLE lymphedema, HTN, DM, neuropathy, DJD, arthritis, sciatica,  chronic back pain, morbid obesity, (BMI 50-59.9)    Limitations Difficulty walking, impaired transfers/functional mobility, chronic back DJD and knee  OA pain , chronic leg swelling decreased strength, impaired  balance, impaired sensation (neuropathy in feet), increased infection risk, increased fall risk,    Repetition Increases Symptoms    Special Tests + Stemmer bilaterally    Patient Stated Goals get swelling back down and replace worn compression stockings to limit LE progression    Currently in Pain? Yes    Pain Score 5     Pain Location Knee    Pain Orientation Right;Left    Pain Onset Other (comment)   chronic                         OT Treatments/Exercises (OP) - 07/04/21 1248       ADLs   ADL Education Given Yes      Manual Therapy   Manual Therapy Edema management    Manual therapy comments skin care throughout LLE MLD    Manual Lymphatic Drainage (MLD) MLD to LLE/LLQ with simultaneous skin care to increase skin flexibility and to increase hydration. Utilized modified sjort neck sequence  omitting lateral neck strokes and performing J strokes to clavicular regions only. Pt instructed in diaphragmatic breathing during MLD. Utilized functional inguinal LN and deep adominal pathways working from proximal to distal through functional inguinal LN/. No c/o pain    throughout Rx.    Compression Bandaging LLE gradient compression wrraps using  one 8 and 10 cm wide short stretch  wrap, with 2 12 cm wide wraps in gradient pattern over single layer stocinett and 0.4 cm thick rosidal foam.                    OT Education - 07/04/21 1249     Education Details Continued skilled Pt/caregiver education  And LE ADL training throughout visit for lymphedema self care/ home program, including compression wrapping, compression garment and device wear/care, lymphatic pumping ther ex, simple self-MLD, and skin care. Discussed progress towards all goals todate.    Person(s) Educated Patient    Methods Explanation;Demonstration    Comprehension Verbalized understanding;Need further instruction;Returned demonstration                 OT Long Term Goals - 07/02/21 0916        OT LONG TERM GOAL #1   Title Pt will demonstrate understanding of lymphedema precautions and signs/ symptoms of cellulitisby identifying 6 items for each using printed Lymphedema Workbook for reference PRN (modified independence) to limit LE progression and infection risk.    Baseline minA    Time 4    Period Days    Status Achieved      OT LONG TERM GOAL #2   Title Pt will require Max assist from caregiver who will be able to apply multilayer, gradient compression wraps using correct techniques after skilled training to reduce limb volume and limity LE progression.    Baseline dependent    Period Days    Status Achieved      OT LONG TERM GOAL #3   Title Pt will achieve 10% limb volume reductions  bilaterally from ankle to tibial tuberosity during Intensive Phase CDT to return limbs to typical size and shape ,. reduce infection risk and improve basic andf instrumental ALDs performance.    Baseline intake volume RLE A-D= 9886.0 ml. intake volume LLE A-D= 10309.9 ml.   Achieved for RLE w/ 20.63% reduction on visit 20, 05/16/21.   Time 12    Period Weeks    Status Achieved   R leg limb volume reduction to date measures 20.63%. L leg volume reductionh to date = 28.3%. Goal met bilaterally     OT LONG TERM GOAL #4   Title Pt will consistently perform all LE self -care home protocols ( modified simple self MLD, skin care, therapeutic exercise, compression therapies) daily  with max CG assistance to to achieve max edema reduction, to enanble functional improvements,  including fitting preferred street shoes and standard sized clothing, and to achieve increased AROM and reduced falls risk.    Baseline Max A    Time 12    Period Weeks    Status Achieved   Pt unable to perform simple self-MLD due to limited AROM and pain. Basic sequential pump trial failed as significant LE symptoms persist. Trial of advanced Flexitouch device was recently completed and results of insurance benefits is  pending.   Target Date 06/07/21      OT LONG TERM GOAL #5   Title Pt will demonstrate an increase in functional score on the FOTO assessment (Funtional utcomes) of 5 points above intyake score to demonstrate improvement in functional mobility .    Baseline Intake FOTO= 32\100    Time 12    Period Weeks    Status Partially Met   Intake FOTO= 32\100. 12/19 value= 40; 06/27/21 value= 35   Target Date 09/05/21  Plan - 07/04/21 1247     Clinical Impression Statement Continued  LLE/LLQ MLD utilizing functional LN in seated position w leg elevated on adjustable foot stool . Provided skin care throughout MLD with low ph castor oil to increase skin mobility and hydration. Reapplied multilayer gradient compression wraps as established. Good tolerance for manual therapy without pain. Cont as per POC. Measure for custom LLE compression garment ASAP.    OT Occupational Profile and History Comprehensive Assessment- Review of records and extensive additional review of physical, cognitive, psychosocial history related to current functional performance    Occupational performance deficits (Please refer to evaluation for details): ADL's;Work;IADL's;Rest and Sleep;Leisure;Social Participation;Other    Body Structure / Function / Physical Skills ADL;Skin integrity;Endurance;Balance;Mobility;Strength;Edema;Obesity;Pain;Gait;ROM;Decreased knowledge of precautions;Decreased knowledge of use of DME;IADL    Clinical Decision Making Multiple treatment options, significant modification of task necessary    Comorbidities Affecting Occupational Performance: Presence of comorbidities impacting occupational performance    Modification or Assistance to Complete Evaluation  Max significant modification of tasks or assist is necessary to complete    OT Frequency 2x / week    OT Duration 12 weeks    OT Treatment/Interventions Self-care/ADL training;Therapeutic exercise;Coping strategies  training;Therapeutic activities;Manual lymph drainage;Energy conservation;Manual Therapy;DME and/or AE instruction;Compression bandaging;Patient/family education    Plan CDT to BLE, one leg at a time, RL:E first    OT Home Exercise Plan fit with BLE custom flat knit compression knee highs    Recommended Other Services Lymphedema management has a high burden of care, especially for Patients who have difficulty, or who are unable to reach their distal legs and feet.  reach their feet and distal legs to aapply bandages, compression garments, to bathe , inspect skin and groom nails. In this case because Pt is unable to perform these activities, daily caregiver assistance with the lymphedema self-care home program  between OT visits is essential for achieving clinical success. Without ongoing, daily caregiver assistance during compression bandaging, donning and doffing compression garments, skin care, and MLD , Pt's prognosis is poor. However, with consistent caregiver assistance with lymphedema home program prognosis for improved lymphedema control and reducing infection risk and progression is good.    Consulted and Agree with Plan of Care Patient             Patient will benefit from skilled therapeutic intervention in order to improve the following deficits and impairments:   Body Structure / Function / Physical Skills: ADL, Skin integrity, Endurance, Balance, Mobility, Strength, Edema, Obesity, Pain, Gait, ROM, Decreased knowledge of precautions, Decreased knowledge of use of DME, IADL       Visit Diagnosis: Lymphedema, not elsewhere classified    Problem List Patient Active Problem List   Diagnosis Date Noted   Osteoarthritis 10/04/2019   Spondylodiscitis 04/26/2019   Lymphedema 04/26/2019   Morbid obesity ([place]) 04/26/2019   BMI 45.0-49.9, adult ([place]) 08/20/2018   Controlled type 2 diabetes mellitus without complication, without long-term current use of insulin ([place]) 11/26/2017    GERD (gastroesophageal reflux disease) 11/26/2017   Hyperlipidemia, mixed 11/26/2017   Hypertension, essential 11/26/2017   Chest pain at rest 11/26/2017    Andrey Spearman, Nancy, OTR/L, [facility] 07/04/21 12:50 PM    [place] MAIN [facility] SERVICES [place], [place], [address] Phone: [phone]   Fax:  [phone]  Name: CEAZIA HARB MRN: [id number] Date of Birth: 11-15-1955

## 2021-07-04 NOTE — Patient Instructions (Signed)

## 2021-07-09 ENCOUNTER — Other Ambulatory Visit: Payer: Self-pay

## 2021-07-09 ENCOUNTER — Ambulatory Visit: Payer: Medicare PPO | Admitting: Occupational Therapy

## 2021-07-09 DIAGNOSIS — I890 Lymphedema, not elsewhere classified: Secondary | ICD-10-CM | POA: Diagnosis not present

## 2021-07-09 NOTE — Therapy (Signed)
[place] MAIN [facility] SERVICES [address] [place], [place], [address] Phone: [phone]   Fax:  [phone]  Occupational Therapy Treatment  Patient Details  Name: Nancy Riley MRN: [id number] Date of Birth: June 19, 1955 Referring Provider (OT): Eulogio Ditch, NP   Encounter Date: 07/09/2021   OT End of Session - 07/09/21 1013     Visit Number 33    Number of Visits 36    Date for OT Re-Evaluation 09/05/21    OT Start Time 1010    OT Stop Time 1105    OT Time Calculation (min) 55 min    Activity Tolerance Patient tolerated treatment well;No increased pain;Patient limited by pain    Behavior During Therapy [facility] for tasks assessed/performed             Past Medical History:  Diagnosis Date   Diabetes mellitus without complication ([place])    DJD (degenerative joint disease)    GERD (gastroesophageal reflux disease)    Hypercholesteremia    Hypertension    Morbid obesity with BMI of 50.0-59.9, adult ([place])    Neuropathy    Phlegmon    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   Subjective Assessment - 07/09/21 1014     Subjective  Nancy Riley presents for OT Rx visit 78 /36 to address BLE lymphedema. Pt arrives seated in a transport wc with new RLE compression knee high in place and LLE wraps in place. Pt reports 4/10 bilateral knee pain. "it mainly hurts when I stand up. My legs hurt, my back hurts, all of that hurts."    Pertinent History Spondylodiscitis, BLE lymphedema, HTN, DM, neuropathy, DJD, arthritis, sciatica,  chronic back pain, morbid obesity, (BMI 50-59.9)    Limitations Difficulty walking, impaired transfers/functional mobility,  chronic back DJD and knee  OA pain , chronic leg swelling decreased strength, impaired balance, impaired sensation (neuropathy in feet), increased infection risk, increased fall risk,    Repetition Increases Symptoms    Special Tests + Stemmer bilaterally    Patient Stated Goals get swelling back down and replace worn compression stockings to limit LE progression    Currently in Pain? Yes    Pain Score 4     Pain Onset Other (comment)   chronic                         OT Treatments/Exercises (OP) - 07/09/21 1252       ADLs   ADL Education Given Yes      Manual Therapy   Manual Therapy Edema management    Manual therapy comments skin care throughout LLE MLD    Manual Lymphatic Drainage (MLD) MLD to LLE/LLQ with simultaneous skin care to increase skin flexibility and to increase hydration. Utilized modified sjort neck sequence  omitting lateral neck strokes and performing J strokes to clavicular regions only. Pt instructed in diaphragmatic breathing during MLD. Utilized functional inguinal LN and deep adominal pathways working from proximal to distal through functional inguinal LN/. No c/o pain    throughout Rx.    Compression Bandaging LLE gradient  compression wrraps using  one 8 and 10 cm wide short stretch wrap, with 2 12 cm wide wraps in gradient pattern over single layer stocinett and 0.4 cm thick rosidal foam.                    OT Education - 07/09/21 1016     Education Details Continued skilled Pt/caregiver education  And LE ADL training throughout visit for lymphedema self care/ home program, including compression wrapping, compression garment and device wear/care, lymphatic pumping ther ex, simple self-MLD, and skin care. Discussed progress towards all goals todate.    Person(s) Educated Patient    Methods Explanation;Demonstration    Comprehension Verbalized understanding;Need further instruction;Returned demonstration                 OT Long  Term Goals - 07/02/21 0916       OT LONG TERM GOAL #1   Title Pt will demonstrate understanding of lymphedema precautions and signs/ symptoms of cellulitisby identifying 6 items for each using printed Lymphedema Workbook for reference PRN (modified independence) to limit LE progression and infection risk.    Baseline minA    Time 4    Period Days    Status Achieved      OT LONG TERM GOAL #2   Title Pt will require Max assist from caregiver who will be able to apply multilayer, gradient compression wraps using correct techniques after skilled training to reduce limb volume and limity LE progression.    Baseline dependent    Period Days    Status Achieved      OT LONG TERM GOAL #3   Title Pt will achieve 10% limb volume reductions  bilaterally from ankle to tibial tuberosity during Intensive Phase CDT to return limbs to typical size and shape ,. reduce infection risk and improve basic andf instrumental ALDs performance.    Baseline intake volume RLE A-D= 9886.0 ml. intake volume LLE A-D= 10309.9 ml.   Achieved for RLE w/ 20.63% reduction on visit 20, 05/16/21.   Time 12    Period Weeks    Status Achieved   R leg limb volume reduction to date measures 20.63%. L leg volume reductionh to date = 28.3%. Goal met bilaterally     OT LONG TERM GOAL #4   Title Pt will consistently perform all LE self -care home protocols ( modified simple self MLD, skin care, therapeutic exercise, compression therapies) daily  with max CG assistance to to achieve max edema reduction, to enanble functional improvements,  including fitting preferred street shoes and standard sized clothing, and to achieve increased AROM and reduced falls risk.    Baseline Max A    Time 12    Period Weeks    Status Achieved   Pt unable to perform simple self-MLD due to limited AROM and pain. Basic sequential pump trial failed as significant LE symptoms persist. Trial of advanced Flexitouch device was recently completed and results of  insurance benefits is pending.   Target Date 06/07/21      OT LONG TERM GOAL #5   Title Pt will demonstrate an increase in functional score on the FOTO assessment (Funtional utcomes) of 5 points above intyake score to demonstrate improvement in functional mobility .    Baseline Intake FOTO= 32\100    Time 12    Period Weeks    Status Partially Met   Intake FOTO= 32\100. 12/19 value= 40; 06/27/21 value= 35   Target Date 09/05/21  Plan - 07/09/21 1252     Clinical Impression Statement Provided RLE/RLQ MLD utilizing functional inguinal LN as established.. Provided skin care throughout MLD with low ph castor oil to increase skin mobility and hydration. Reapplied multilayer gradient compression wraps as established. Good tolerance for manual therapy without pain. Cont as per POC.    OT Occupational Profile and History Comprehensive Assessment- Review of records and extensive additional review of physical, cognitive, psychosocial history related to current functional performance    Occupational performance deficits (Please refer to evaluation for details): ADL's;Work;IADL's;Rest and Sleep;Leisure;Social Participation;Other    Body Structure / Function / Physical Skills ADL;Skin integrity;Endurance;Balance;Mobility;Strength;Edema;Obesity;Pain;Gait;ROM;Decreased knowledge of precautions;Decreased knowledge of use of DME;IADL    Clinical Decision Making Multiple treatment options, significant modification of task necessary    Comorbidities Affecting Occupational Performance: Presence of comorbidities impacting occupational performance    Modification or Assistance to Complete Evaluation  Max significant modification of tasks or assist is necessary to complete    OT Frequency 2x / week    OT Duration 12 weeks    OT Treatment/Interventions Self-care/ADL training;Therapeutic exercise;Coping strategies training;Therapeutic activities;Manual lymph drainage;Energy  conservation;Manual Therapy;DME and/or AE instruction;Compression bandaging;Patient/family education    Plan CDT to BLE, one leg at a time, RL:E first    OT Home Exercise Plan fit with BLE custom flat knit compression knee highs    Recommended Other Services Lymphedema management has a high burden of care, especially for Patients who have difficulty, or who are unable to reach their distal legs and feet.  reach their feet and distal legs to aapply bandages, compression garments, to bathe , inspect skin and groom nails. In this case because Pt is unable to perform these activities, daily caregiver assistance with the lymphedema self-care home program  between OT visits is essential for achieving clinical success. Without ongoing, daily caregiver assistance during compression bandaging, donning and doffing compression garments, skin care, and MLD , Pt's prognosis is poor. However, with consistent caregiver assistance with lymphedema home program prognosis for improved lymphedema control and reducing infection risk and progression is good.    Consulted and Agree with Plan of Care Patient             Patient will benefit from skilled therapeutic intervention in order to improve the following deficits and impairments:   Body Structure / Function / Physical Skills: ADL, Skin integrity, Endurance, Balance, Mobility, Strength, Edema, Obesity, Pain, Gait, ROM, Decreased knowledge of precautions, Decreased knowledge of use of DME, IADL       Visit Diagnosis: Lymphedema, not elsewhere classified    Problem List Patient Active Problem List   Diagnosis Date Noted   Osteoarthritis 10/04/2019   Spondylodiscitis 04/26/2019   Lymphedema 04/26/2019   Morbid obesity ([place]) 04/26/2019   BMI 45.0-49.9, adult ([place]) 08/20/2018   Controlled type 2 diabetes mellitus without complication, without long-term current use of insulin ([place]) 11/26/2017   GERD (gastroesophageal reflux disease) 11/26/2017    Hyperlipidemia, mixed 11/26/2017   Hypertension, essential 11/26/2017   Chest pain at rest 11/26/2017    Andrey Spearman, Nancy, OTR/L, [facility] 07/09/21 12:54 PM  [place] MAIN [facility] SERVICES [place], [place], [address] Phone: [phone]   Fax:  [phone]  Name: Nancy Riley MRN: [id number] Date of Birth: 1955/11/09

## 2021-07-09 NOTE — Patient Instructions (Signed)

## 2021-07-11 ENCOUNTER — Ambulatory Visit: Payer: Medicare PPO | Admitting: Occupational Therapy

## 2021-07-11 ENCOUNTER — Other Ambulatory Visit: Payer: Self-pay

## 2021-07-11 DIAGNOSIS — I890 Lymphedema, not elsewhere classified: Secondary | ICD-10-CM

## 2021-07-11 NOTE — Therapy (Signed)
[place] MAIN [facility] SERVICES [address] [place], [place], [address] Phone: [phone]   Fax:  [phone]  Occupational Therapy Treatment  Patient Details  Name: UMA JERDE MRN: [id number] Date of Birth: 1955-09-12 Referring Provider (OT): Eulogio Ditch, NP   Encounter Date: 07/11/2021   OT End of Session - 07/11/21 0958     Visit Number 34    Number of Visits 36    Date for OT Re-Evaluation 09/05/21    OT Start Time 0950    OT Stop Time 1100    OT Time Calculation (min) 70 min    Activity Tolerance Patient tolerated treatment well;No increased pain;Patient limited by pain    Behavior During Therapy [facility] for tasks assessed/performed             Past Medical History:  Diagnosis Date   Diabetes mellitus without complication ([place])    DJD (degenerative joint disease)    GERD (gastroesophageal reflux disease)    Hypercholesteremia    Hypertension    Morbid obesity with BMI of 50.0-59.9, adult ([place])    Neuropathy    Phlegmon    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   Subjective Assessment - 07/11/21 0959     Subjective  Ms Bassinger presents for OT Rx visit 32 /36 to address BLE lymphedema. Pt arrives seated in a transport wc with new RLE compression knee high in place and LLE wraps in place. Pt reports 5/10 bilateral knee pain.    Pertinent History Spondylodiscitis, BLE lymphedema, HTN, DM, neuropathy, DJD, arthritis, sciatica,  chronic back pain, morbid obesity, (BMI 50-59.9)    Limitations Difficulty walking, impaired transfers/functional mobility, chronic back DJD and knee  OA pain , chronic leg swelling decreased strength, impaired  balance, impaired sensation (neuropathy in feet), increased infection risk, increased fall risk,    Repetition Increases Symptoms    Special Tests + Stemmer bilaterally    Patient Stated Goals get swelling back down and replace worn compression stockings to limit LE progression    Pain Onset Other (comment)   chronic                         OT Treatments/Exercises (OP) - 07/11/21 1000       ADLs   ADL Education Given Yes      Manual Therapy   Manual Therapy Edema management    Edema Management completed anatomical measurements for LLE custom compression garment    Compression Bandaging LLE gradient compression wrraps using  one 8 and 10 cm wide short stretch wrap, with 2 12 cm wide wraps in gradient pattern over single layer stocinett and 0.4 cm thick rosidal foam.                    OT Education - 07/11/21 1243     Education Details Continued skilled Pt/caregiver education  And LE ADL training throughout visit for lymphedema self care/ home program, including compression wrapping, compression garment and device wear/care, lymphatic pumping ther ex, simple self-MLD, and skin care. Discussed progress towards all goals  todate.    Person(s) Educated Patient    Methods Explanation;Demonstration    Comprehension Verbalized understanding;Need further instruction;Returned demonstration                 OT Long Term Goals - 07/02/21 0916       OT LONG TERM GOAL #1   Title Pt will demonstrate understanding of lymphedema precautions and signs/ symptoms of cellulitisby identifying 6 items for each using printed Lymphedema Workbook for reference PRN (modified independence) to limit LE progression and infection risk.    Baseline minA    Time 4    Period Days    Status Achieved      OT LONG TERM GOAL #2   Title Pt will require Max assist from caregiver who will be able to apply multilayer, gradient compression wraps using correct techniques after skilled  training to reduce limb volume and limity LE progression.    Baseline dependent    Period Days    Status Achieved      OT LONG TERM GOAL #3   Title Pt will achieve 10% limb volume reductions  bilaterally from ankle to tibial tuberosity during Intensive Phase CDT to return limbs to typical size and shape ,. reduce infection risk and improve basic andf instrumental ALDs performance.    Baseline intake volume RLE A-D= 9886.0 ml. intake volume LLE A-D= 10309.9 ml.   Achieved for RLE w/ 20.63% reduction on visit 20, 05/16/21.   Time 12    Period Weeks    Status Achieved   R leg limb volume reduction to date measures 20.63%. L leg volume reductionh to date = 28.3%. Goal met bilaterally     OT LONG TERM GOAL #4   Title Pt will consistently perform all LE self -care home protocols ( modified simple self MLD, skin care, therapeutic exercise, compression therapies) daily  with max CG assistance to to achieve max edema reduction, to enanble functional improvements,  including fitting preferred street shoes and standard sized clothing, and to achieve increased AROM and reduced falls risk.    Baseline Max A    Time 12    Period Weeks    Status Achieved   Pt unable to perform simple self-MLD due to limited AROM and pain. Basic sequential pump trial failed as significant LE symptoms persist. Trial of advanced Flexitouch device was recently completed and results of insurance benefits is pending.   Target Date 06/07/21      OT LONG TERM GOAL #5   Title Pt will demonstrate an increase in functional score on the FOTO assessment (Funtional utcomes) of 5 points above intyake score to demonstrate improvement in functional mobility .    Baseline Intake FOTO= 32\100    Time 12    Period Weeks    Status Partially Met   Intake FOTO= 32\100. 12/19 value= 40; 06/27/21 value= 35   Target Date 09/05/21                   Plan - 07/11/21 1241     Clinical Impression Statement Completed anatomicqal  measurements   for LLE custom, knee length compression garment. Order faxed to DME vendor after session. Provided RLE/RLQ MLD utilizing functional inguinal LN as established.. Provided skin care throughout MLD with low ph castor oil to increase skin mobility and hydration. Reapplied multilayer gradient compression wraps as established. Good tolerance for manual therapy without pain. Cont as per POC.    OT Occupational Profile and History Comprehensive Assessment- Review  of records and extensive additional review of physical, cognitive, psychosocial history related to current functional performance    Occupational performance deficits (Please refer to evaluation for details): ADL's;Work;IADL's;Rest and Sleep;Leisure;Social Participation;Other    Body Structure / Function / Physical Skills ADL;Skin integrity;Endurance;Balance;Mobility;Strength;Edema;Obesity;Pain;Gait;ROM;Decreased knowledge of precautions;Decreased knowledge of use of DME;IADL    Clinical Decision Making Multiple treatment options, significant modification of task necessary    Comorbidities Affecting Occupational Performance: Presence of comorbidities impacting occupational performance    Modification or Assistance to Complete Evaluation  Max significant modification of tasks or assist is necessary to complete    OT Frequency 2x / week    OT Duration 12 weeks    OT Treatment/Interventions Self-care/ADL training;Therapeutic exercise;Coping strategies training;Therapeutic activities;Manual lymph drainage;Energy conservation;Manual Therapy;DME and/or AE instruction;Compression bandaging;Patient/family education    Plan CDT to BLE, one leg at a time, RL:E first    OT Home Exercise Plan fit with BLE custom flat knit compression knee highs    Recommended Other Services Lymphedema management has a high burden of care, especially for Patients who have difficulty, or who are unable to reach their distal legs and feet.  reach their feet and distal  legs to aapply bandages, compression garments, to bathe , inspect skin and groom nails. In this case because Pt is unable to perform these activities, daily caregiver assistance with the lymphedema self-care home program  between OT visits is essential for achieving clinical success. Without ongoing, daily caregiver assistance during compression bandaging, donning and doffing compression garments, skin care, and MLD , Pt's prognosis is poor. However, with consistent caregiver assistance with lymphedema home program prognosis for improved lymphedema control and reducing infection risk and progression is good.    Consulted and Agree with Plan of Care Patient             Patient will benefit from skilled therapeutic intervention in order to improve the following deficits and impairments:   Body Structure / Function / Physical Skills: ADL, Skin integrity, Endurance, Balance, Mobility, Strength, Edema, Obesity, Pain, Gait, ROM, Decreased knowledge of precautions, Decreased knowledge of use of DME, IADL       Visit Diagnosis: Lymphedema, not elsewhere classified    Problem List Patient Active Problem List   Diagnosis Date Noted   Osteoarthritis 10/04/2019   Spondylodiscitis 04/26/2019   Lymphedema 04/26/2019   Morbid obesity ([place]) 04/26/2019   BMI 45.0-49.9, adult ([place]) 08/20/2018   Controlled type 2 diabetes mellitus without complication, without long-term current use of insulin ([place]) 11/26/2017   GERD (gastroesophageal reflux disease) 11/26/2017   Hyperlipidemia, mixed 11/26/2017   Hypertension, essential 11/26/2017   Chest pain at rest 11/26/2017   Andrey Spearman, MS, OTR/L, [facility] 07/11/21 12:43 PM ` [place] [facility] MAIN [facility] SERVICES [place], [place], [address] Phone: [phone]   Fax:  [phone]  Name: SIGNORA ZUCCO MRN: [id number] Date of Birth: 07-23-1955

## 2021-07-11 NOTE — Patient Instructions (Signed)

## 2021-07-16 ENCOUNTER — Ambulatory Visit: Payer: Medicare PPO | Admitting: Occupational Therapy

## 2021-07-16 ENCOUNTER — Other Ambulatory Visit: Payer: Self-pay

## 2021-07-16 DIAGNOSIS — I890 Lymphedema, not elsewhere classified: Secondary | ICD-10-CM | POA: Diagnosis not present

## 2021-07-16 NOTE — Therapy (Signed)
[place] MAIN [facility] SERVICES [address] [place], [place], [address] Phone: [phone]   Fax:  [phone]  Occupational Therapy Treatment  Patient Details  Name: Nancy Riley MRN: [id number] Date of Birth: 1955/09/28 Referring Provider (OT): Eulogio Ditch, NP   Encounter Date: 07/16/2021   OT End of Session - 07/16/21 1006     Visit Number 35    Number of Visits 36    Date for OT Re-Evaluation 09/05/21    OT Start Time 1000    OT Stop Time 1100    OT Time Calculation (min) 60 min    Activity Tolerance Patient tolerated treatment well;No increased pain;Patient limited by pain    Behavior During Therapy [facility] for tasks assessed/performed             Past Medical History:  Diagnosis Date   Diabetes mellitus without complication ([place])    DJD (degenerative joint disease)    GERD (gastroesophageal reflux disease)    Hypercholesteremia    Hypertension    Morbid obesity with BMI of 50.0-59.9, adult ([place])    Neuropathy    Phlegmon    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   Subjective Assessment - 07/16/21 1010     Subjective  [name] presents for OT Rx visit 11 /36 to address BLE lymphedema. Pt arrives seated in a transport wc with new RLE compression knee high in place and LLE wraps in place. Pt reports 5/10 bilateral knee pain.[name] reports she worked on setting up her payment arrangement with DME vendor for custom compression stocking measured at last session.    Pertinent History Spondylodiscitis, BLE lymphedema, HTN, DM, neuropathy, DJD, arthritis, sciatica,  chronic back pain, morbid obesity, (BMI 50-59.9)    Limitations  Difficulty walking, impaired transfers/functional mobility, chronic back DJD and knee  OA pain , chronic leg swelling decreased strength, impaired balance, impaired sensation (neuropathy in feet), increased infection risk, increased fall risk,    Repetition Increases Symptoms    Special Tests + Stemmer bilaterally    Patient Stated Goals get swelling back down and replace worn compression stockings to limit LE progression    Currently in Pain? Yes    Pain Score 5     Pain Onset Other (comment)   chronic                         OT Treatments/Exercises (OP) - 07/16/21 1011       ADLs   ADL Education Given Yes      Manual Therapy   Manual Therapy Edema management;Manual Lymphatic Drainage (MLD);Compression Bandaging    Manual therapy comments skin care throughout LLE MLD    Manual Lymphatic Drainage (MLD) MLD to LLE/LLQ with simultaneous skin care to increase skin flexibility and to increase hydration. Utilized modified sjort neck sequence  omitting lateral neck strokes and performing J strokes to clavicular regions only. Pt instructed in diaphragmatic breathing during MLD. Utilized functional inguinal LN and deep adominal pathways working from proximal to distal through functional inguinal LN/. No c/o pain    throughout  Rx.    Compression Bandaging LLE gradient compression wrraps using  one 8 and 10 cm wide short stretch wrap, with 2 12 cm wide wraps in gradient pattern over single layer stocinett and 0.4 cm thick rosidal foam.                    OT Education - 07/16/21 1141     Education Details Continued skilled Pt/caregiver education  And LE ADL training throughout visit for lymphedema self care/ home program, including compression wrapping, compression garment and device wear/care, lymphatic pumping ther ex, simple self-MLD, and skin care. Discussed progress towards all goals todate.    Person(s) Educated Patient    Methods Explanation;Demonstration     Comprehension Verbalized understanding;Need further instruction;Returned demonstration                 OT Long Term Goals - 07/02/21 0916       OT LONG TERM GOAL #1   Title Pt will demonstrate understanding of lymphedema precautions and signs/ symptoms of cellulitisby identifying 6 items for each using printed Lymphedema Workbook for reference PRN (modified independence) to limit LE progression and infection risk.    Baseline minA    Time 4    Period Days    Status Achieved      OT LONG TERM GOAL #2   Title Pt will require Max assist from caregiver who will be able to apply multilayer, gradient compression wraps using correct techniques after skilled training to reduce limb volume and limity LE progression.    Baseline dependent    Period Days    Status Achieved      OT LONG TERM GOAL #3   Title Pt will achieve 10% limb volume reductions  bilaterally from ankle to tibial tuberosity during Intensive Phase CDT to return limbs to typical size and shape ,. reduce infection risk and improve basic andf instrumental ALDs performance.    Baseline intake volume RLE A-D= 9886.0 ml. intake volume LLE A-D= 10309.9 ml.   Achieved for RLE w/ 20.63% reduction on visit 20, 05/16/21.   Time 12    Period Weeks    Status Achieved   R leg limb volume reduction to date measures 20.63%. L leg volume reductionh to date = 28.3%. Goal met bilaterally     OT LONG TERM GOAL #4   Title Pt will consistently perform all LE self -care home protocols ( modified simple self MLD, skin care, therapeutic exercise, compression therapies) daily  with max CG assistance to to achieve max edema reduction, to enanble functional improvements,  including fitting preferred street shoes and standard sized clothing, and to achieve increased AROM and reduced falls risk.    Baseline Max A    Time 12    Period Weeks    Status Achieved   Pt unable to perform simple self-MLD due to limited AROM and pain. Basic sequential pump  trial failed as significant LE symptoms persist. Trial of advanced Flexitouch device was recently completed and results of insurance benefits is pending.   Target Date 06/07/21      OT LONG TERM GOAL #5   Title Pt will demonstrate an increase in functional score on the FOTO assessment (Funtional utcomes) of 5 points above intyake score to demonstrate improvement in functional mobility .    Baseline Intake FOTO= 32\100    Time 12    Period Weeks    Status Partially Met   Intake FOTO= 32\100. 12/19 value= 40; 06/27/21  value= 35   Target Date 09/05/21                   Plan - 07/16/21 1139     Clinical Impression Statement Provided RLE/RLQ MLD utilizing functional inguinal LN as established.. Provided skin care throughout MLD with low ph castor oil to increase skin mobility and hydration. Reapplied multilayer gradient compression wraps as established. Good tolerance for manual therapy without pain. Cont as per POC.    OT Occupational Profile and History Comprehensive Assessment- Review of records and extensive additional review of physical, cognitive, psychosocial history related to current functional performance    Occupational performance deficits (Please refer to evaluation for details): ADL's;Work;IADL's;Rest and Sleep;Leisure;Social Participation;Other    Body Structure / Function / Physical Skills ADL;Skin integrity;Endurance;Balance;Mobility;Strength;Edema;Obesity;Pain;Gait;ROM;Decreased knowledge of precautions;Decreased knowledge of use of DME;IADL    Clinical Decision Making Multiple treatment options, significant modification of task necessary    Comorbidities Affecting Occupational Performance: Presence of comorbidities impacting occupational performance    Modification or Assistance to Complete Evaluation  Max significant modification of tasks or assist is necessary to complete    OT Frequency 2x / week    OT Duration 12 weeks    OT Treatment/Interventions Self-care/ADL  training;Therapeutic exercise;Coping strategies training;Therapeutic activities;Manual lymph drainage;Energy conservation;Manual Therapy;DME and/or AE instruction;Compression bandaging;Patient/family education    Plan CDT to BLE, one leg at a time, RL:E first    OT Home Exercise Plan fit with BLE custom flat knit compression knee highs    Recommended Other Services Lymphedema management has a high burden of care, especially for Patients who have difficulty, or who are unable to reach their distal legs and feet.  reach their feet and distal legs to aapply bandages, compression garments, to bathe , inspect skin and groom nails. In this case because Pt is unable to perform these activities, daily caregiver assistance with the lymphedema self-care home program  between OT visits is essential for achieving clinical success. Without ongoing, daily caregiver assistance during compression bandaging, donning and doffing compression garments, skin care, and MLD , Pt's prognosis is poor. However, with consistent caregiver assistance with lymphedema home program prognosis for improved lymphedema control and reducing infection risk and progression is good.    Consulted and Agree with Plan of Care Patient             Patient will benefit from skilled therapeutic intervention in order to improve the following deficits and impairments:   Body Structure / Function / Physical Skills: ADL, Skin integrity, Endurance, Balance, Mobility, Strength, Edema, Obesity, Pain, Gait, ROM, Decreased knowledge of precautions, Decreased knowledge of use of DME, IADL       Visit Diagnosis: Lymphedema, not elsewhere classified    Problem List Patient Active Problem List   Diagnosis Date Noted   Osteoarthritis 10/04/2019   Spondylodiscitis 04/26/2019   Lymphedema 04/26/2019   Morbid obesity ([place]) 04/26/2019   BMI 45.0-49.9, adult ([place]) 08/20/2018   Controlled type 2 diabetes mellitus without complication, without  long-term current use of insulin ([place]) 11/26/2017   GERD (gastroesophageal reflux disease) 11/26/2017   Hyperlipidemia, mixed 11/26/2017   Hypertension, essential 11/26/2017   Chest pain at rest 11/26/2017    Andrey Spearman, MS, OTR/L, [facility] 07/16/21 11:42 AM   [place], [place], [address] Phone: [phone]   Fax:  [phone]  Name: Nancy Riley MRN: [id number] Date of Birth: 1955/06/19

## 2021-07-16 NOTE — Patient Instructions (Signed)

## 2021-07-18 ENCOUNTER — Other Ambulatory Visit: Payer: Self-pay

## 2021-07-18 ENCOUNTER — Ambulatory Visit: Payer: Medicare PPO | Admitting: Occupational Therapy

## 2021-07-18 DIAGNOSIS — I890 Lymphedema, not elsewhere classified: Secondary | ICD-10-CM | POA: Diagnosis not present

## 2021-07-18 NOTE — Patient Instructions (Signed)

## 2021-07-18 NOTE — Therapy (Signed)
[place] MAIN [facility] SERVICES [address] [place], [place], [address] Phone: [phone]   Fax:  [phone]  Occupational Therapy Treatment  Patient Details  Name: Nancy Riley MRN: [id number] Date of Birth: Sep 04, 1955 Referring Provider (OT): Eulogio Ditch, NP   Encounter Date: 07/18/2021   OT End of Session - 07/18/21 1213     Visit Number 36    Number of Visits 72    Date for OT Re-Evaluation 09/05/21    OT Start Time 1000    OT Stop Time 1100    OT Time Calculation (min) 60 min    Activity Tolerance Patient tolerated treatment well;No increased pain;Patient limited by pain    Behavior During Therapy [facility] for tasks assessed/performed             Past Medical History:  Diagnosis Date   Diabetes mellitus without complication ([place])    DJD (degenerative joint disease)    GERD (gastroesophageal reflux disease)    Hypercholesteremia    Hypertension    Morbid obesity with BMI of 50.0-59.9, adult ([place])    Neuropathy    Phlegmon    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   Subjective Assessment - 07/18/21 1214     Subjective  Ms Hendryx presents for OT Rx visit 36 /72 to address BLE lymphedema. Pt arrives seated in a transport wc with new RLE compression knee high in place and LLE wraps in place. Pt reports 5/10 bilateral knee pain.Ms Picchi has no new complaints this morning.    Pertinent History Spondylodiscitis, BLE lymphedema, HTN, DM, neuropathy, DJD, arthritis, sciatica,  chronic back pain, morbid obesity, (BMI 50-59.9)    Limitations Difficulty walking, impaired transfers/functional mobility, chronic back DJD and knee  OA pain ,  chronic leg swelling decreased strength, impaired balance, impaired sensation (neuropathy in feet), increased infection risk, increased fall risk,    Repetition Increases Symptoms    Special Tests + Stemmer bilaterally    Patient Stated Goals get swelling back down and replace worn compression stockings to limit LE progression    Pain Onset Other (comment)   chronic                         OT Treatments/Exercises (OP) - 07/18/21 1216       ADLs   ADL Education Given Yes      Manual Therapy   Manual Therapy Edema management;Manual Lymphatic Drainage (MLD);Compression Bandaging    Manual therapy comments skin care throughout LLE MLD    Manual Lymphatic Drainage (MLD) MLD to LLE/LLQ with simultaneous skin care to increase skin flexibility and to increase hydration. Utilized modified sjort neck sequence  omitting lateral neck strokes and performing J strokes to clavicular regions only. Pt instructed in diaphragmatic breathing during MLD. Utilized functional inguinal LN and deep adominal pathways working from proximal to distal through functional inguinal LN/. No c/o pain    throughout Rx.    Compression Bandaging LLE gradient compression wrraps using  one 8 and 10 cm wide short stretch wrap, with 2 12 cm wide wraps in  gradient pattern over single layer stocinett and 0.4 cm thick rosidal foam.                    OT Education - 07/18/21 1216     Education Details Continued skilled Pt/caregiver education  And LE ADL training throughout visit for lymphedema self care/ home program, including compression wrapping, compression garment and device wear/care, lymphatic pumping ther ex, simple self-MLD, and skin care. Discussed progress towards all goals todate.    Person(s) Educated Patient    Methods Explanation;Demonstration    Comprehension Verbalized understanding;Need further instruction;Returned demonstration                 OT Long Term Goals - 07/02/21 0916        OT LONG TERM GOAL #1   Title Pt will demonstrate understanding of lymphedema precautions and signs/ symptoms of cellulitisby identifying 6 items for each using printed Lymphedema Workbook for reference PRN (modified independence) to limit LE progression and infection risk.    Baseline minA    Time 4    Period Days    Status Achieved      OT LONG TERM GOAL #2   Title Pt will require Max assist from caregiver who will be able to apply multilayer, gradient compression wraps using correct techniques after skilled training to reduce limb volume and limity LE progression.    Baseline dependent    Period Days    Status Achieved      OT LONG TERM GOAL #3   Title Pt will achieve 10% limb volume reductions  bilaterally from ankle to tibial tuberosity during Intensive Phase CDT to return limbs to typical size and shape ,. reduce infection risk and improve basic andf instrumental ALDs performance.    Baseline intake volume RLE A-D= 9886.0 ml. intake volume LLE A-D= 10309.9 ml.   Achieved for RLE w/ 20.63% reduction on visit 20, 05/16/21.   Time 12    Period Weeks    Status Achieved   R leg limb volume reduction to date measures 20.63%. L leg volume reductionh to date = 28.3%. Goal met bilaterally     OT LONG TERM GOAL #4   Title Pt will consistently perform all LE self -care home protocols ( modified simple self MLD, skin care, therapeutic exercise, compression therapies) daily  with max CG assistance to to achieve max edema reduction, to enanble functional improvements,  including fitting preferred street shoes and standard sized clothing, and to achieve increased AROM and reduced falls risk.    Baseline Max A    Time 12    Period Weeks    Status Achieved   Pt unable to perform simple self-MLD due to limited AROM and pain. Basic sequential pump trial failed as significant LE symptoms persist. Trial of advanced Flexitouch device was recently completed and results of insurance benefits is  pending.   Target Date 06/07/21      OT LONG TERM GOAL #5   Title Pt will demonstrate an increase in functional score on the FOTO assessment (Funtional utcomes) of 5 points above intyake score to demonstrate improvement in functional mobility .    Baseline Intake FOTO= 32\100    Time 12    Period Weeks    Status Partially Met   Intake FOTO= 32\100. 12/19 value= 40; 06/27/21 value= 35   Target Date 09/05/21                   Plan - 07/18/21  1215     Clinical Impression Statement Continued  LLE/LLQ MLD utilizing functional LN in seated position w leg elevated on adjustable foot stool . Provided skin care throughout MLD with low ph castor oil to increase skin mobility and hydration. Reapplied multilayer gradient compression wraps as established. Good tolerance for manual therapy without pain. Cont as per POC. Fit LLE custojm compression garment ASAP.    OT Occupational Profile and History Comprehensive Assessment- Review of records and extensive additional review of physical, cognitive, psychosocial history related to current functional performance    Occupational performance deficits (Please refer to evaluation for details): ADL's;Work;IADL's;Rest and Sleep;Leisure;Social Participation;Other    Body Structure / Function / Physical Skills ADL;Skin integrity;Endurance;Balance;Mobility;Strength;Edema;Obesity;Pain;Gait;ROM;Decreased knowledge of precautions;Decreased knowledge of use of DME;IADL    Clinical Decision Making Multiple treatment options, significant modification of task necessary    Comorbidities Affecting Occupational Performance: Presence of comorbidities impacting occupational performance    Modification or Assistance to Complete Evaluation  Max significant modification of tasks or assist is necessary to complete    OT Frequency 2x / week    OT Duration 12 weeks    OT Treatment/Interventions Self-care/ADL training;Therapeutic exercise;Coping strategies training;Therapeutic  activities;Manual lymph drainage;Energy conservation;Manual Therapy;DME and/or AE instruction;Compression bandaging;Patient/family education    Plan CDT to BLE, one leg at a time, RL:E first    OT Home Exercise Plan fit with BLE custom flat knit compression knee highs    Recommended Other Services Lymphedema management has a high burden of care, especially for Patients who have difficulty, or who are unable to reach their distal legs and feet.  reach their feet and distal legs to aapply bandages, compression garments, to bathe , inspect skin and groom nails. In this case because Pt is unable to perform these activities, daily caregiver assistance with the lymphedema self-care home program  between OT visits is essential for achieving clinical success. Without ongoing, daily caregiver assistance during compression bandaging, donning and doffing compression garments, skin care, and MLD , Pt's prognosis is poor. However, with consistent caregiver assistance with lymphedema home program prognosis for improved lymphedema control and reducing infection risk and progression is good.    Consulted and Agree with Plan of Care Patient             Patient will benefit from skilled therapeutic intervention in order to improve the following deficits and impairments:   Body Structure / Function / Physical Skills: ADL, Skin integrity, Endurance, Balance, Mobility, Strength, Edema, Obesity, Pain, Gait, ROM, Decreased knowledge of precautions, Decreased knowledge of use of DME, IADL       Visit Diagnosis: Lymphedema, not elsewhere classified    Problem List Patient Active Problem List   Diagnosis Date Noted   Osteoarthritis 10/04/2019   Spondylodiscitis 04/26/2019   Lymphedema 04/26/2019   Morbid obesity ([place]) 04/26/2019   BMI 45.0-49.9, adult ([place]) 08/20/2018   Controlled type 2 diabetes mellitus without complication, without long-term current use of insulin ([place]) 11/26/2017   GERD (gastroesophageal  reflux disease) 11/26/2017   Hyperlipidemia, mixed 11/26/2017   Hypertension, essential 11/26/2017   Chest pain at rest 11/26/2017   Andrey Spearman, MS, OTR/L, [facility] 07/18/21 12:17 PM   [place] MAIN [facility] SERVICES [place], [place], [address] Phone: [phone]   Fax:  [phone]  Name: LAWREN SEXSON MRN: [id number] Date of Birth: September 15, 1955

## 2021-07-23 ENCOUNTER — Ambulatory Visit: Payer: Medicare PPO | Admitting: Occupational Therapy

## 2021-07-23 ENCOUNTER — Other Ambulatory Visit: Payer: Self-pay

## 2021-07-23 DIAGNOSIS — I890 Lymphedema, not elsewhere classified: Secondary | ICD-10-CM

## 2021-07-23 NOTE — Therapy (Signed)
[place] MAIN [facility] SERVICES [address] [place], [place], [address] Phone: [phone]   Fax:  [phone]  Occupational Therapy Treatment  Patient Details  Name: Nancy Riley MRN: [id number] Date of Birth: Sep 04, 1955 Referring Provider (OT): Eulogio Ditch, NP   Encounter Date: 07/23/2021   OT End of Session - 07/23/21 1019     Visit Number 37    Number of Visits 72    Date for OT Re-Evaluation 09/05/21    OT Start Time 1007    OT Stop Time 1110    OT Time Calculation (min) 63 min    Activity Tolerance Patient tolerated treatment well;No increased pain;Patient limited by pain    Behavior During Therapy [facility] for tasks assessed/performed             Past Medical History:  Diagnosis Date   Diabetes mellitus without complication ([place])    DJD (degenerative joint disease)    GERD (gastroesophageal reflux disease)    Hypercholesteremia    Hypertension    Morbid obesity with BMI of 50.0-59.9, adult ([place])    Neuropathy    Phlegmon    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   Subjective Assessment - 07/23/21 1021     Subjective  Ms Gockel presents for OT Rx visit 37 /72 to address BLE lymphedema. Pt arrives seated in a transport wc with new RLE compression knee high in place and LLE wraps in place. Pt reports 6/10 bilateral knee pain.Ms Cartwright reports she has worked out Midwife with DME vendor, and is working on managing her payments.    Pertinent History Spondylodiscitis, BLE lymphedema, HTN, DM, neuropathy, DJD, arthritis, sciatica,  chronic back pain, morbid obesity, (BMI 50-59.9)    Limitations Difficulty walking, impaired  transfers/functional mobility, chronic back DJD and knee  OA pain , chronic leg swelling decreased strength, impaired balance, impaired sensation (neuropathy in feet), increased infection risk, increased fall risk,    Repetition Increases Symptoms    Special Tests + Stemmer bilaterally    Patient Stated Goals get swelling back down and replace worn compression stockings to limit LE progression    Pain Onset Other (comment)   chronic                         OT Treatments/Exercises (OP) - 07/23/21 1232       ADLs   ADL Education Given Yes      Manual Therapy   Manual Therapy Edema management;Manual Lymphatic Drainage (MLD);Compression Bandaging    Manual therapy comments skin care throughout LLE MLD    Manual Lymphatic Drainage (MLD) MLD to LLE/LLQ with simultaneous skin care to increase skin flexibility and to increase hydration. Utilized modified sjort neck sequence  omitting lateral neck strokes and performing J strokes to clavicular regions only. Pt instructed in diaphragmatic breathing during MLD. Utilized functional inguinal LN and deep adominal pathways working from proximal to distal through functional inguinal LN/. No c/o pain    throughout Rx.    Compression Bandaging LLE gradient compression wrraps using  one 8 and 10 cm  wide short stretch wrap, with 2 12 cm wide wraps in gradient pattern over single layer stocinett and 0.4 cm thick rosidal foam.                    OT Education - 07/23/21 1025     Education Details Continued skilled Pt/caregiver education  And LE ADL training throughout visit for lymphedema self care/ home program, including compression wrapping, compression garment and device wear/care, lymphatic pumping ther ex, simple self-MLD, and skin care. Discussed progress towards all goals todate.    Person(s) Educated Patient    Methods Explanation;Demonstration    Comprehension Verbalized understanding;Need further instruction;Returned  demonstration                 OT Long Term Goals - 07/02/21 0916       OT LONG TERM GOAL #1   Title Pt will demonstrate understanding of lymphedema precautions and signs/ symptoms of cellulitisby identifying 6 items for each using printed Lymphedema Workbook for reference PRN (modified independence) to limit LE progression and infection risk.    Baseline minA    Time 4    Period Days    Status Achieved      OT LONG TERM GOAL #2   Title Pt will require Max assist from caregiver who will be able to apply multilayer, gradient compression wraps using correct techniques after skilled training to reduce limb volume and limity LE progression.    Baseline dependent    Period Days    Status Achieved      OT LONG TERM GOAL #3   Title Pt will achieve 10% limb volume reductions  bilaterally from ankle to tibial tuberosity during Intensive Phase CDT to return limbs to typical size and shape ,. reduce infection risk and improve basic andf instrumental ALDs performance.    Baseline intake volume RLE A-D= 9886.0 ml. intake volume LLE A-D= 10309.9 ml.   Achieved for RLE w/ 20.63% reduction on visit 20, 05/16/21.   Time 12    Period Weeks    Status Achieved   R leg limb volume reduction to date measures 20.63%. L leg volume reductionh to date = 28.3%. Goal met bilaterally     OT LONG TERM GOAL #4   Title Pt will consistently perform all LE self -care home protocols ( modified simple self MLD, skin care, therapeutic exercise, compression therapies) daily  with max CG assistance to to achieve max edema reduction, to enanble functional improvements,  including fitting preferred street shoes and standard sized clothing, and to achieve increased AROM and reduced falls risk.    Baseline Max A    Time 12    Period Weeks    Status Achieved   Pt unable to perform simple self-MLD due to limited AROM and pain. Basic sequential pump trial failed as significant LE symptoms persist. Trial of advanced  Flexitouch device was recently completed and results of insurance benefits is pending.   Target Date 06/07/21      OT LONG TERM GOAL #5   Title Pt will demonstrate an increase in functional score on the FOTO assessment (Funtional utcomes) of 5 points above intyake score to demonstrate improvement in functional mobility .    Baseline Intake FOTO= 32\100    Time 12    Period Weeks    Status Partially Met   Intake FOTO= 32\100. 12/19 value= 40; 06/27/21 value= 35   Target Date 09/05/21  Plan - 07/23/21 1224     Clinical Impression Statement LLE limb volume very well managed with gradient compression wraps this morning. Skin remains dry and flakey. Fibrosis at distal ankle is easily palpable. Somewhat less dense than when we commenced CDT, but well entrenched from years of progressive LE. Thigh lobules remain very dense and hardened w peau de' orange skin ( distended pores) medially. Unfortunately it ius impossible to apply effective compression wraps to thighs due to body habitus and mobility status. We are still waiting to hear if Pt's insurance will cover an advanced pneumatic sequential device, or "pump", that will transport full leg and thigh LE beyond the inguinal nodes towards thoracic  duct in effort to bettwer decongest abdomen , buttocks and thighs.Pt continues to tolerate MLD to LLE. Reapplied multilayer wraps after manual therapy with simultaneous skin care. Cont as per POC.    OT Occupational Profile and History Comprehensive Assessment- Review of records and extensive additional review of physical, cognitive, psychosocial history related to current functional performance    Occupational performance deficits (Please refer to evaluation for details): ADL's;Work;IADL's;Rest and Sleep;Leisure;Social Participation;Other    Body Structure / Function / Physical Skills ADL;Skin integrity;Endurance;Balance;Mobility;Strength;Edema;Obesity;Pain;Gait;ROM;Decreased knowledge  of precautions;Decreased knowledge of use of DME;IADL    Clinical Decision Making Multiple treatment options, significant modification of task necessary    Comorbidities Affecting Occupational Performance: Presence of comorbidities impacting occupational performance    Modification or Assistance to Complete Evaluation  Max significant modification of tasks or assist is necessary to complete    OT Frequency 2x / week    OT Duration 12 weeks    OT Treatment/Interventions Self-care/ADL training;Therapeutic exercise;Coping strategies training;Therapeutic activities;Manual lymph drainage;Energy conservation;Manual Therapy;DME and/or AE instruction;Compression bandaging;Patient/family education    Plan CDT to BLE, one leg at a time, RL:E first    OT Home Exercise Plan fit with BLE custom flat knit compression knee highs    Recommended Other Services Lymphedema management has a high burden of care, especially for Patients who have difficulty, or who are unable to reach their distal legs and feet.  reach their feet and distal legs to aapply bandages, compression garments, to bathe , inspect skin and groom nails. In this case because Pt is unable to perform these activities, daily caregiver assistance with the lymphedema self-care home program  between OT visits is essential for achieving clinical success. Without ongoing, daily caregiver assistance during compression bandaging, donning and doffing compression garments, skin care, and MLD , Pt's prognosis is poor. However, with consistent caregiver assistance with lymphedema home program prognosis for improved lymphedema control and reducing infection risk and progression is good.    Consulted and Agree with Plan of Care Patient             Patient will benefit from skilled therapeutic intervention in order to improve the following deficits and impairments:   Body Structure / Function / Physical Skills: ADL, Skin integrity, Endurance, Balance, Mobility,  Strength, Edema, Obesity, Pain, Gait, ROM, Decreased knowledge of precautions, Decreased knowledge of use of DME, IADL       Visit Diagnosis: Lymphedema, not elsewhere classified    Problem List Patient Active Problem List   Diagnosis Date Noted   Osteoarthritis 10/04/2019   Spondylodiscitis 04/26/2019   Lymphedema 04/26/2019   Morbid obesity ([place]) 04/26/2019   BMI 45.0-49.9, adult ([place]) 08/20/2018   Controlled type 2 diabetes mellitus without complication, without long-term current use of insulin ([place]) 11/26/2017   GERD (gastroesophageal reflux disease) 11/26/2017  Hyperlipidemia, mixed 11/26/2017   Hypertension, essential 11/26/2017   Chest pain at rest 11/26/2017    Andrey Spearman, MS, OTR/L, [facility] 07/23/21 12:34 PM   [place] MAIN [facility] SERVICES [address] [place], [place], [address] Phone: [phone]   Fax:  [phone]  Name: Nancy Riley MRN: [id number] Date of Birth: 1955/10/23

## 2021-07-23 NOTE — Patient Instructions (Signed)

## 2021-07-25 ENCOUNTER — Ambulatory Visit: Payer: Medicare PPO | Admitting: Occupational Therapy

## 2021-07-25 ENCOUNTER — Other Ambulatory Visit: Payer: Self-pay

## 2021-07-25 DIAGNOSIS — I890 Lymphedema, not elsewhere classified: Secondary | ICD-10-CM | POA: Diagnosis not present

## 2021-07-25 NOTE — Therapy (Signed)
[place] MAIN [facility] SERVICES [address] [place], [place], [address] Phone: [phone]   Fax:  [phone]  Occupational Therapy Treatment  Patient Details  Name: Nancy Riley MRN: [id number] Date of Birth: 04/09/1956 Referring Provider (OT): Eulogio Ditch, NP   Encounter Date: 07/25/2021   OT End of Session - 07/25/21 1106     Visit Number 38    Number of Visits 72    Date for OT Re-Evaluation 09/05/21    OT Start Time 1100    OT Stop Time 1200    OT Time Calculation (min) 60 min    Activity Tolerance Patient tolerated treatment well;No increased pain;Patient limited by pain    Behavior During Therapy 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   Subjective Assessment - 07/25/21 1109     Subjective  Nancy Riley presents for OT Rx visit 38 /72 to address BLE lymphedema. Pt arrives seated in a transport wc with new RLE compression knee high in place and LLE wraps in place. Pt reports 6/10 bilateral knee pain.Nancy Riley has no new concerns.    Pertinent History Spondylodiscitis, BLE lymphedema, HTN, DM, neuropathy, DJD, arthritis, sciatica,  chronic back pain, morbid obesity, (BMI 50-59.9)    Limitations Difficulty walking, impaired transfers/functional mobility, chronic back DJD and knee  OA pain , chronic leg  swelling decreased strength, impaired balance, impaired sensation (neuropathy in feet), increased infection risk, increased fall risk,    Repetition Increases Symptoms    Special Tests + Stemmer bilaterally    Patient Stated Goals get swelling back down and replace worn compression stockings to limit LE progression    Pain Onset Other (comment)   chronic                         OT Treatments/Exercises (OP) - 07/25/21 1110       ADLs   ADL Education Given Yes (P)       Manual Therapy   Manual Therapy Edema management;Manual Lymphatic Drainage (MLD);Compression Bandaging (P)     Manual therapy comments skin care throughout LLE MLD (P)     Manual Lymphatic Drainage (MLD) MLD to LLE/LLQ with simultaneous skin care to increase skin flexibility and to increase hydration. Utilized modified sjort neck sequence  omitting lateral neck strokes and performing J strokes to clavicular regions only. Pt instructed in diaphragmatic breathing during MLD. Utilized functional inguinal LN and deep adominal pathways working from proximal to distal through functional inguinal LN/. No c/o pain    throughout Rx. (P)     Compression Bandaging LLE gradient compression wrraps using  one 8 and 10 cm wide short stretch wrap, with  2 12 cm wide wraps in gradient pattern over single layer stocinett and 0.4 cm thick rosidal foam. (P)                     OT Education - 07/25/21 1254     Education Details Continued skilled Pt/caregiver education  And LE ADL training throughout visit for lymphedema self care/ home program, including compression wrapping, compression garment and device wear/care, lymphatic pumping ther ex, simple self-MLD, and skin care. Discussed progress towards all goals todate.    Person(s) Educated Patient    Methods Explanation;Demonstration    Comprehension Verbalized understanding;Need further instruction;Returned demonstration                 OT Long Term Goals -  07/02/21 0916       OT LONG TERM GOAL #1   Title Pt will demonstrate understanding of lymphedema precautions and signs/ symptoms of cellulitisby identifying 6 items for each using printed Lymphedema Workbook for reference PRN (modified independence) to limit LE progression and infection risk.    Baseline minA    Time 4    Period Days    Status Achieved      OT LONG TERM GOAL #2   Title Pt will require Max assist from caregiver who will be able to apply multilayer, gradient compression wraps using correct techniques after skilled training to reduce limb volume and limity LE progression.    Baseline dependent    Period Days    Status Achieved      OT LONG TERM GOAL #3   Title Pt will achieve 10% limb volume reductions  bilaterally from ankle to tibial tuberosity during Intensive Phase CDT to return limbs to typical size and shape ,. reduce infection risk and improve basic andf instrumental ALDs performance.    Baseline intake volume RLE A-D= 9886.0 ml. intake volume LLE A-D= 10309.9 ml.   Achieved for RLE w/ 20.63% reduction on visit 20, 05/16/21.   Time 12    Period Weeks    Status Achieved   R leg limb volume reduction to date measures 20.63%. L leg volume reductionh to date = 28.3%. Goal met bilaterally     OT LONG TERM GOAL #4   Title Pt will consistently perform all LE self -care home protocols ( modified simple self MLD, skin care, therapeutic exercise, compression therapies) daily  with max CG assistance to to achieve max edema reduction, to enanble functional improvements,  including fitting preferred street shoes and standard sized clothing, and to achieve increased AROM and reduced falls risk.    Baseline Max A    Time 12    Period Weeks    Status Achieved   Pt unable to perform simple self-MLD due to limited AROM and pain. Basic sequential pump trial failed as significant LE symptoms persist. Trial of advanced Flexitouch device was recently completed and results of insurance  benefits is pending.   Target Date 06/07/21      OT LONG TERM GOAL #5   Title Pt will demonstrate an increase in functional score on the FOTO assessment (Funtional utcomes) of 5 points above intyake score to demonstrate improvement in functional mobility .    Baseline Intake FOTO= 32\100    Time 12    Period Weeks    Status Partially Met   Intake FOTO= 32\100. 12/19 value= 40; 06/27/21 value= 35   Target Date 09/05/21  Plan - 07/25/21 1253     Clinical Impression Statement Continued  LLE/LLQ MLD utilizing functional LN in seated position w leg elevated on adjustable foot stool . Provided skin care throughout MLD with low ph castor oil to increase skin mobility and hydration. Reapplied multilayer gradient compression wraps as established. Good tolerance for manual therapy without pain. Cont as per POC. Fit LLE custojm compression garment ASAP.    OT Occupational Profile and History Comprehensive Assessment- Review of records and extensive additional review of physical, cognitive, psychosocial history related to current functional performance    Occupational performance deficits (Please refer to evaluation for details): ADL's;Work;IADL's;Rest and Sleep;Leisure;Social Participation;Other    Body Structure / Function / Physical Skills ADL;Skin integrity;Endurance;Balance;Mobility;Strength;Edema;Obesity;Pain;Gait;ROM;Decreased knowledge of precautions;Decreased knowledge of use of DME;IADL    Clinical Decision Making Multiple treatment options, significant modification of task necessary    Comorbidities Affecting Occupational Performance: Presence of comorbidities impacting occupational performance    Modification or Assistance to Complete Evaluation  Max significant modification of tasks or assist is necessary to complete    OT Frequency 2x / week    OT Duration 12 weeks    OT Treatment/Interventions Self-care/ADL training;Therapeutic exercise;Coping strategies  training;Therapeutic activities;Manual lymph drainage;Energy conservation;Manual Therapy;DME and/or AE instruction;Compression bandaging;Patient/family education    Plan CDT to BLE, one leg at a time, RL:E first    OT Home Exercise Plan fit with BLE custom flat knit compression knee highs    Recommended Other Services Lymphedema management has a high burden of care, especially for Patients who have difficulty, or who are unable to reach their distal legs and feet.  reach their feet and distal legs to aapply bandages, compression garments, to bathe , inspect skin and groom nails. In this case because Pt is unable to perform these activities, daily caregiver assistance with the lymphedema self-care home program  between OT visits is essential for achieving clinical success. Without ongoing, daily caregiver assistance during compression bandaging, donning and doffing compression garments, skin care, and MLD , Pt's prognosis is poor. However, with consistent caregiver assistance with lymphedema home program prognosis for improved lymphedema control and reducing infection risk and progression is good.    Consulted and Agree with Plan of Care Patient             Patient will benefit from skilled therapeutic intervention in order to improve the following deficits and impairments:   Body Structure / Function / Physical Skills: ADL, Skin integrity, Endurance, Balance, Mobility, Strength, Edema, Obesity, Pain, Gait, ROM, Decreased knowledge of precautions, Decreased knowledge of use of DME, IADL       Visit Diagnosis: Lymphedema, not elsewhere classified    Problem List Patient Active Problem List   Diagnosis Date Noted   Osteoarthritis 10/04/2019   Spondylodiscitis 04/26/2019   Lymphedema 04/26/2019   Morbid obesity ([place]) 04/26/2019   BMI 45.0-49.9, adult ([place]) 08/20/2018   Controlled type 2 diabetes mellitus without complication, without long-term current use of insulin ([place]) 11/26/2017    GERD (gastroesophageal reflux disease) 11/26/2017   Hyperlipidemia, mixed 11/26/2017   Hypertension, essential 11/26/2017   Chest pain at rest 11/26/2017    Nancy Spearman, Nancy, OTR/L, [facility] 07/25/21 12:55 PM    [place] MAIN [facility] SERVICES [place], [place], [address] Phone: [phone]   Fax:  [phone]  Name: Nancy Riley MRN: [id number] Date of Birth: 08-Jun-1955

## 2021-07-30 ENCOUNTER — Other Ambulatory Visit: Payer: Self-pay

## 2021-07-30 ENCOUNTER — Ambulatory Visit: Payer: Medicare PPO | Admitting: Occupational Therapy

## 2021-07-30 DIAGNOSIS — I890 Lymphedema, not elsewhere classified: Secondary | ICD-10-CM

## 2021-07-30 NOTE — Therapy (Signed)
[place] [facility] SERVICES [address] [place], [place], [address] Phone: [phone]   Fax:  [phone]  Occupational Therapy Treatment  Patient Details  Name: Nancy Riley MRN: [id number] Date of Birth: 1955/06/17 Referring Provider (OT): Eulogio Ditch, NP   Encounter Date: 07/30/2021   OT End of Session - 07/30/21 1020     Visit Number 39    Number of Visits 72    Date for OT Re-Evaluation 09/05/21    OT Start Time 1000    OT Stop Time 1100    OT Time Calculation (min) 60 min    Activity Tolerance Patient tolerated treatment well;No increased pain;Patient limited by pain    Behavior During Therapy [facility] for tasks assessed/performed             Past Medical History:  Diagnosis Date   Diabetes mellitus without complication ([place])    DJD (degenerative joint disease)    GERD (gastroesophageal reflux disease)    Hypercholesteremia    Hypertension    Morbid obesity with BMI of 50.0-59.9, adult ([place])    Neuropathy    Phlegmon    ventral epidural phlegmon identified on lumbar spine MRI at [facility] in Nov 2020   Plantar fasciitis    Sciatica    Spondylodiscitis    lumbar spine (Nov 2020 at [facility])    Past Surgical History:  Procedure Laterality Date   BREAST EXCISIONAL BIOPSY Left 1970   negative   COLONOSCOPY      There were no vitals filed for this visit.   Subjective Assessment - 07/30/21 1230     Subjective  Ms Suddeth presents for OT Rx visit 39 /72 to address BLE lymphedema. Pt arrives seated in a transport wc with new RLE compression knee high in place and LLE wraps in place. Pt reports 6/10 bilateral knee pain.She denies new concerns today. She reports she was able to send money to the DME provider and order her LLE compression stocking.    Pertinent History Spondylodiscitis, BLE lymphedema, HTN, DM, neuropathy, DJD, arthritis, sciatica,  chronic back pain, morbid obesity, (BMI 50-59.9)    Limitations Difficulty  walking, impaired transfers/functional mobility, chronic back DJD and knee  OA pain , chronic leg swelling decreased strength, impaired balance, impaired sensation (neuropathy in feet), increased infection risk, increased fall risk,    Repetition Increases Symptoms    Special Tests + Stemmer bilaterally    Patient Stated Goals get swelling back down and replace worn compression stockings to limit LE progression    Currently in Pain? Yes    Pain Onset Other (comment)   chronic                         OT Treatments/Exercises (OP) - 07/30/21 1233       ADLs   ADL Education Given Yes      Manual Therapy   Manual Therapy Edema management;Manual Lymphatic Drainage (MLD);Compression Bandaging    Manual therapy comments skin care throughout LLE MLD    Manual Lymphatic Drainage (MLD) MLD to LLE/LLQ with simultaneous skin care to increase skin flexibility and to increase hydration. Utilized modified sjort neck sequence  omitting lateral neck strokes and performing J strokes to clavicular regions only. Pt instructed in diaphragmatic breathing during MLD. Utilized functional inguinal LN and deep adominal pathways working from proximal to distal through functional inguinal LN/. No c/o pain    throughout Rx.    Compression Bandaging  LLE gradient compression wrraps using  one 8 and 10 cm wide short stretch wrap, with 2 12 cm wide wraps in gradient pattern over single layer stocinett and 0.4 cm thick rosidal foam.                    OT Education - 07/30/21 1234     Education Details Continued skilled Pt/caregiver education  And LE ADL training throughout visit for lymphedema self care/ home program, including compression wrapping, compression garment and device wear/care, lymphatic pumping ther ex, simple self-MLD, and skin care. Discussed progress towards all goals todate.    Person(s) Educated Patient    Methods Explanation;Demonstration    Comprehension Verbalized  understanding;Need further instruction;Returned demonstration                 OT Long Term Goals - 07/30/21 1233       OT LONG TERM GOAL #1   Title Pt will demonstrate understanding of lymphedema precautions and signs/ symptoms of cellulitisby identifying 6 items for each using printed Lymphedema Workbook for reference PRN (modified independence) to limit LE progression and infection risk.    Baseline minA    Time 4    Period Days    Status Achieved      OT LONG TERM GOAL #2   Title Pt will require Max assist from caregiver who will be able to apply multilayer, gradient compression wraps using correct techniques after skilled training to reduce limb volume and limity LE progression.    Baseline dependent    Period Days    Status Achieved      OT LONG TERM GOAL #3   Title Pt will achieve 10% limb volume reductions  bilaterally from ankle to tibial tuberosity during Intensive Phase CDT to return limbs to typical size and shape ,. reduce infection risk and improve basic andf instrumental ALDs performance.    Baseline intake volume RLE A-D= 9886.0 ml. intake volume LLE A-D= 10309.9 ml.   Achieved for RLE w/ 20.63% reduction on visit 20, 05/16/21.   Time 12    Period Weeks    Status Achieved   R leg limb volume reduction to date measures 20.63%. L leg volume reductionh to date = 28.3%. Goal met bilaterally     OT LONG TERM GOAL #4   Title Pt will consistently perform all LE self -care home protocols ( modified simple self MLD, skin care, therapeutic exercise, compression therapies) daily  with max CG assistance to to achieve max edema reduction, to enanble functional improvements,  including fitting preferred street shoes and standard sized clothing, and to achieve increased AROM and reduced falls risk.    Baseline Max A    Time 12    Period Weeks    Status Achieved   Pt unable to perform simple self-MLD due to limited AROM and pain. Basic sequential pump trial failed as significant  LE symptoms persist. Trial of advanced Flexitouch device was recently completed and results of insurance benefits is pending.   Target Date 06/07/21      OT LONG TERM GOAL #5   Title Pt will demonstrate an increase in functional score on the FOTO assessment (Funtional utcomes) of 5 points above intyake score to demonstrate improvement in functional mobility .    Baseline Intake FOTO= 32\100    Time 12    Period Weeks    Status Partially Met   Intake FOTO= 32\100. 12/19 value= 40; 06/27/21 value= 35   Target Date  09/05/21                   Plan - 07/30/21 1235     Clinical Impression Statement RLE Limb volume appears well managed over time with replacement compression garment. Pt remains diligent with compression as prescribed, donning and doffing with modified independence (assistive device and extra time). Pt tolerated LLE /LLQ MLD without increased pain. Issued replacement Rosidal foam for wraps as Pt's old supply is worn out. Pt has ordered LLE custom replacement garment . Once fitted in a couple of weeks we will reduce OT frequency to follow along/ PRN. Cont as per POC.    OT Occupational Profile and History Comprehensive Assessment- Review of records and extensive additional review of physical, cognitive, psychosocial history related to current functional performance    Occupational performance deficits (Please refer to evaluation for details): ADL's;Work;IADL's;Rest and Sleep;Leisure;Social Participation;Other    Body Structure / Function / Physical Skills ADL;Skin integrity;Endurance;Balance;Mobility;Strength;Edema;Obesity;Pain;Gait;ROM;Decreased knowledge of precautions;Decreased knowledge of use of DME;IADL    Clinical Decision Making Multiple treatment options, significant modification of task necessary    Comorbidities Affecting Occupational Performance: Presence of comorbidities impacting occupational performance    Modification or Assistance to Complete Evaluation  Max  significant modification of tasks or assist is necessary to complete    OT Frequency 2x / week    OT Duration 12 weeks    OT Treatment/Interventions Self-care/ADL training;Therapeutic exercise;Coping strategies training;Therapeutic activities;Manual lymph drainage;Energy conservation;Manual Therapy;DME and/or AE instruction;Compression bandaging;Patient/family education    Plan CDT to BLE, one leg at a time, RL:E first    OT Home Exercise Plan fit with BLE custom flat knit compression knee highs    Recommended Other Services Lymphedema management has a high burden of care, especially for Patients who have difficulty, or who are unable to reach their distal legs and feet.  reach their feet and distal legs to aapply bandages, compression garments, to bathe , inspect skin and groom nails. In this case because Pt is unable to perform these activities, daily caregiver assistance with the lymphedema self-care home program  between OT visits is essential for achieving clinical success. Without ongoing, daily caregiver assistance during compression bandaging, donning and doffing compression garments, skin care, and MLD , Pt's prognosis is poor. However, with consistent caregiver assistance with lymphedema home program prognosis for improved lymphedema control and reducing infection risk and progression is good.    Consulted and Agree with Plan of Care Patient             Patient will benefit from skilled therapeutic intervention in order to improve the following deficits and impairments:   Body Structure / Function / Physical Skills: ADL, Skin integrity, Endurance, Balance, Mobility, Strength, Edema, Obesity, Pain, Gait, ROM, Decreased knowledge of precautions, Decreased knowledge of use of DME, IADL       Visit Diagnosis: Lymphedema, not elsewhere classified    Problem List Patient Active Problem List   Diagnosis Date Noted   Osteoarthritis 10/04/2019   Spondylodiscitis 04/26/2019    Lymphedema 04/26/2019   Morbid obesity ([place]) 04/26/2019   BMI 45.0-49.9, adult ([place]) 08/20/2018   Controlled type 2 diabetes mellitus without complication, without long-term current use of insulin ([place]) 11/26/2017   GERD (gastroesophageal reflux disease) 11/26/2017   Hyperlipidemia, mixed 11/26/2017   Hypertension, essential 11/26/2017   Chest pain at rest 11/26/2017    Andrey Spearman, MS, OTR/L, CLT-LANA 07/30/21 12:40 PM   [place] [facility] SERVICES [place]  [place], [place], [address] Phone: [phone]   Fax:  [phone]  Name: DEBY ADGER MRN: [id number] Date of Birth: 02/25/1956

## 2021-08-01 ENCOUNTER — Ambulatory Visit: Payer: Medicare PPO | Attending: Nurse Practitioner | Admitting: Occupational Therapy

## 2021-08-01 ENCOUNTER — Other Ambulatory Visit: Payer: Self-pay

## 2021-08-01 DIAGNOSIS — I739 Peripheral vascular disease, unspecified: Secondary | ICD-10-CM | POA: Insufficient documentation

## 2021-08-01 DIAGNOSIS — I890 Lymphedema, not elsewhere classified: Secondary | ICD-10-CM | POA: Diagnosis not present

## 2021-08-01 NOTE — Progress Notes (Signed)
MRN : [id number]  Nancy Riley is a 66 y.o. (09-20-1955) female who presents with chief complaint of check leg swelling.  History of Present Illness:   Patient returns to the office for followup evaluation regarding leg swelling.  The swelling has persisted and the pain associated with swelling continues. There have not been any interval development of a ulcerations or wounds.   Since the previous visit the patient has been wearing graduated compression stockings and has noted some improvement in the lymphedema. The patient has been using compression routinely morning until night.  She has also been using her lymphedema pump.   The patient also states elevation during the day and exercise is being done too.  She has elevated blood that she is also utilizing.  The patient also had nonpalpable pulses as well.   Previous noninvasive study showed ABI 0.96 on the right and 1.09 on the left.  Patient has triphasic/biphasic waveforms with good toe waveforms bilaterally.  There is no evidence of DVT or superficial thrombophlebitis in the bilateral lower extremities.  There is also no evidence of deep venous insufficiency or superficial venous reflux in the bilateral lower  No outpatient medications have been marked as taking for the 08/02/21 encounter (Appointment) with Gilda Crease, Latina Craver, MD.    Past Medical History:  Diagnosis Date   Diabetes mellitus without complication (HCC)    DJD (degenerative joint disease)    GERD (gastroesophageal reflux disease)    Hypercholesteremia    Hypertension    Morbid obesity with BMI of 50.0-59.9, adult (HCC)    Neuropathy    Phlegmon    ventral epidural phlegmon identified on lumbar spine MRI at [facility] in Nov 2020   Plantar fasciitis    Sciatica    Spondylodiscitis    lumbar spine (Nov 2020 at [facility])    Past Surgical History:  Procedure Laterality Date   BREAST EXCISIONAL BIOPSY Left 1970   negative   COLONOSCOPY      Social History Social  History   Tobacco Use   Smoking status: Never   Smokeless tobacco: Never  Substance Use Topics   Alcohol use: Not Currently   Drug use: Never    Family History Family History  Problem Relation Age of Onset   Breast cancer Neg Hx    Colon cancer Neg Hx     No Known Allergies   REVIEW OF SYSTEMS (Negative unless checked)  Constitutional: [] Weight loss  [] Fever  [] Chills Cardiac: [] Chest pain   [] Chest pressure   [] Palpitations   [] Shortness of breath when laying flat   [] Shortness of breath with exertion. Vascular:  [] Pain in legs with walking   [] Pain in legs at rest  [] History of DVT   [] Phlebitis   [x] Swelling in legs   [] Varicose veins   [] Non-healing ulcers Pulmonary:   [] Uses home oxygen   [] Productive cough   [] Hemoptysis   [] Wheeze  [] COPD   [] Asthma Neurologic:  [] Dizziness   [] Seizures   [] History of stroke   [] History of TIA  [] Aphasia   [] Vissual changes   [] Weakness or numbness in arm   [] Weakness or numbness in leg Musculoskeletal:   [] Joint swelling   [] Joint pain   [] Low back pain Hematologic:  [] Easy bruising  [] Easy bleeding   [] Hypercoagulable state   [] Anemic Gastrointestinal:  [] Diarrhea   [] Vomiting  [x] Gastroesophageal reflux/heartburn   [] Difficulty swallowing. Genitourinary:  [] Chronic kidney disease   [] Difficult urination  [] Frequent urination   [] Blood in urine Skin:  []   Rashes   [] Ulcers  Psychological:  [] History of anxiety   []  History of major depression.  Physical Examination  There were no vitals filed for this visit. There is no height or weight on file to calculate BMI. Gen: WD/WN, NAD Head: [place]/AT, No temporalis wasting.  Ear/Nose/Throat: Hearing grossly intact, nares w/o erythema or drainage, pinna without lesions Eyes: PER, EOMI, sclera nonicteric.  Neck: Supple, no gross masses.  No JVD.  Pulmonary:  Good air movement, no audible wheezing, no use of accessory muscles.  Cardiac: RRR, precordium not hyperdynamic. Vascular:  scattered  varicosities present bilaterally.  Mild venous stasis changes to the legs bilaterally.  3+ soft pitting edema  Vessel Right Left  Radial Palpable Palpable  Gastrointestinal: soft, non-distended. No guarding/no peritoneal signs.  Musculoskeletal: M/S 5/5 throughout.  No deformity.  Neurologic: CN 2-12 intact. Pain and light touch intact in extremities.  Symmetrical.  Speech is fluent. Motor exam as listed above. Psychiatric: Judgment intact, Mood & affect appropriate for pt's clinical situation. Dermatologic: Venous rashes no ulcers noted.  No changes consistent with cellulitis. Lymph : No lichenification or skin changes of chronic lymphedema.  CBC Lab Results  Component Value Date   WBC 5.5 09/27/2019   HGB 11.3 (L) 09/27/2019   HCT 35.3 (L) 09/27/2019   MCV 85.1 09/27/2019   PLT 350 09/27/2019    BMET    Component Value Date/Time   NA 135 09/27/2019 0048   NA 137 03/05/2013 1643   K 3.9 09/27/2019 0048   K 4.0 03/05/2013 1643   CL 100 09/27/2019 0048   CL 103 03/05/2013 1643   CO2 26 09/27/2019 0048   CO2 29 03/05/2013 1643   GLUCOSE 128 (H) 09/27/2019 0048   GLUCOSE 121 (H) 03/05/2013 1643   BUN 18 09/27/2019 0048   BUN 16 03/05/2013 1643   CREATININE 1.01 (H) 09/27/2019 0048   CREATININE 1.11 03/05/2013 1643   CALCIUM 9.0 09/27/2019 0048   CALCIUM 8.9 03/05/2013 1643   GFRNONAA 60 (L) 09/27/2019 0048   GFRNONAA 56 (L) 03/05/2013 1643   GFRAA >60 09/27/2019 0048   GFRAA >60 03/05/2013 1643   CrCl cannot be calculated (Patient's most recent lab result is older than the maximum 21 days allowed.).  COAG No results found for: INR, PROTIME  Radiology No results found.   Assessment/Plan 1. Lymphedema  No surgery or intervention at this point in time.    I have reviewed my discussion with the patient regarding lymphedema and why it  causes symptoms.  Patient will continue wearing graduated compression stockings class 1 (20-30 mmHg) on a daily basis a prescription  was given. The patient is reminded to put the stockings on first thing in the morning and removing them in the evening. The patient is instructed specifically not to sleep in the stockings.   In addition, behavioral modification throughout the day will be continued.  This will include frequent elevation (such as in a recliner), use of over the counter pain medications as needed and exercise such as walking.  I have reviewed systemic causes for chronic edema such as liver, kidney and cardiac etiologies and there does not appear to be any significant changes in these organ systems over the past year.  The patient is under the impression that these organ systems are all stable and unchanged.    The patient will continue aggressive use of the  lymph pump.  This will continue to improve the edema control and prevent sequela such as ulcers  and infections.   The patient will follow-up with me on an annual basis.    2. PAD (peripheral artery disease) (HCC)  Recommend:  The patient has evidence of atherosclerosis of the lower extremities with claudication.  The patient does not voice lifestyle limiting changes at this point in time.  Noninvasive studies do not suggest clinically significant change.  No invasive studies, angiography or surgery at this time The patient should continue walking and begin a more formal exercise program.  The patient should continue antiplatelet therapy and aggressive treatment of the lipid abnormalities  No changes in the patient's medications at this time  The patient should continue wearing graduated compression socks 10-15 mmHg strength to control the mild edema.    3. Hyperlipidemia, mixed Continue statin as ordered and reviewed, no changes at this time   4. Hypertension, essential Continue antihypertensive medications as already ordered, these medications have been reviewed and there are no changes at this time.   5. Gastroesophageal reflux disease, unspecified  whether esophagitis present Continue PPI as already ordered, this medication has been reviewed and there are no changes at this time.  Avoidence of caffeine and alcohol  Moderate elevation of the head of the bed      Levora Dredge, MD  08/01/2021 2:28 PM

## 2021-08-01 NOTE — Therapy (Signed)
[place] MAIN [facility] SERVICES [address] [place], [place], [address] Phone: [phone]   Fax:  [phone]  Occupational Therapy Treatment Note and Progress Report: Lymphedema Care  Patient Details  Name: AARIONA MOMON MRN: [id number] Date of Birth: [birth year] Referring Provider (OT): Eulogio Ditch, NP Reporting Period : 08/01/21  Encounter Date: 07/02/21 - 08/01/2021   OT End of Session - 08/01/21 1100     Visit Number 40    Number of Visits 72    Date for OT Re-Evaluation 09/05/21    OT Start Time 1055    OT Stop Time 1205    OT Time Calculation (min) 70 min    Activity Tolerance Patient tolerated treatment well;No increased pain;Patient limited by pain    Behavior During Therapy 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   Subjective Assessment - 08/01/21 1302     Subjective  Ms Ayllon presents for OT Rx visit 40 /72 to address BLE lymphedema. Pt arrives seated in a transport wc with new RLE compression knee high in place and LLE wraps in place. Pt reports 6/10 bilateral knee pain.She denies new concerns today.    Pertinent History Spondylodiscitis, BLE lymphedema, HTN, DM, neuropathy, DJD, arthritis, sciatica,  chronic back pain, morbid obesity, (BMI 50-59.9)    Limitations Difficulty walking, impaired  transfers/functional mobility, chronic back DJD and knee  OA pain , chronic leg swelling decreased strength, impaired balance, impaired sensation (neuropathy in feet), increased infection risk, increased fall risk,    Repetition Increases Symptoms    Special Tests + Stemmer bilaterally    Patient Stated Goals get swelling back down and replace worn compression stockings to limit LE progression    Pain Onset Other (comment)   chronic                         OT Treatments/Exercises (OP) - 08/01/21 1302       ADLs   ADL Education Given Yes      Manual Therapy   Manual Therapy Edema management;Manual Lymphatic Drainage (MLD);Compression Bandaging    Manual therapy comments skin care throughout LLE MLD    Manual Lymphatic Drainage (MLD) MLD to LLE/LLQ with simultaneous skin care to increase skin flexibility and to increase hydration. Utilized modified sjort neck sequence  omitting lateral neck strokes and performing J strokes to clavicular regions only. Pt instructed in diaphragmatic breathing during MLD. Utilized functional inguinal LN and deep adominal pathways working from proximal to distal through functional inguinal LN/. No c/o pain    throughout Rx.    Compression Bandaging LLE gradient compression wrraps using  one 8 and 10 cm wide short stretch  wrap, with 2 12 cm wide wraps in gradient pattern over single layer stocinett and 0.4 cm thick rosidal foam.                    OT Education - 08/01/21 1303     Education Details Continued skilled Pt/caregiver education  And LE ADL training throughout visit for lymphedema self care/ home program, including compression wrapping, compression garment and device wear/care, lymphatic pumping ther ex, simple self-MLD, and skin care. Discussed progress towards all goals todate.    Person(s) Educated Patient    Methods Explanation;Demonstration    Comprehension Verbalized understanding;Need further instruction;Returned  demonstration                 OT Long Term Goals - 08/01/21 1100       OT LONG TERM GOAL #1   Title Pt will demonstrate understanding of lymphedema precautions and signs/ symptoms of cellulitis by identifying 6 items for each using the printed Lymphedema Workbook for reference PRN (modified independence) to limit LE progression and infection risk.    Baseline minA    Time 4    Period Days    Status Achieved      OT LONG TERM GOAL #2   Title Pt will require Max assist from caregiver who will be able to apply multilayer, gradient compression wraps using correct techniques after skilled training to reduce limb volume and limity LE progression.    Baseline dependent    Period Days    Status Achieved      OT LONG TERM GOAL #3   Title Pt will achieve 10% limb volume reductions  bilaterally from ankle to tibial tuberosity during Intensive Phase CDT to return limbs to typical size and shape ,. reduce infection risk and improve basic andf instrumental ALDs performance.    Baseline intake volume RLE A-D= 9886.0 ml. intake volume LLE A-D= 10309.9 ml.   Achieved for RLE w/ 20.63% reduction on visit 20, 05/16/21.   Time 12    Period Weeks    Status Achieved   R leg limb volume reduction to date measures 20.63%. L leg volume reductionh to date = 28.3%. Goal met bilaterally     OT LONG TERM GOAL #4   Title Pt will consistently perform all LE self -care home protocols ( modified simple self MLD, skin care, therapeutic exercise, compression therapies) daily  with max CG assistance to to achieve max edema reduction, to enanble functional improvements,  including fitting preferred street shoes and standard sized clothing, and to achieve increased AROM and reduced falls risk.    Baseline Max A    Time 12    Period Weeks    Status Achieved   Pt unable to perform simple self-MLD due to limited AROM and pain. Basic sequential pump trial failed as significant LE symptoms persist. Trial of advanced  Flexitouch device was recently completed and results of insurance benefits is pending.   Target Date 06/07/21      OT LONG TERM GOAL #5   Title Pt will demonstrate an increase in functional score on the FOTO assessment (Funtional utcomes) of 5 points above intyake score to demonstrate improvement in functional mobility .    Baseline Intake FOTO= 32\100    Time 12    Period Weeks    Status Achieved   Intake FOTO= 32\100. 12/19 value= 40; 06/27/21 value= 35     Long Term Additional Goals   Additional Long Term Goals Yes  Plan - 08/01/21 1300     Clinical Impression Statement Reviewed progress towards goals to date. All met. Pt agrees to have new therapy goal next visit. Please review LONG TERM GOALS section for detailed progress. Pt edu for [employer]. After skilled instruction Pt able to use device  to don stocking with min assist. Provided online resource. Continued  LLE/LLQ MLD utilizing functional LN in seated position w leg elevated on adjustable foot stool . Provided skin care throughout MLD with low ph castor oil to increase skin mobility and hydration. Reapplied multilayer gradient compression wraps as established. Good tolerance for manual therapy without pain. Cont as per POC. Fit LLE custojm compression garment ASAP.    OT Occupational Profile and History Comprehensive Assessment- Review of records and extensive additional review of physical, cognitive, psychosocial history related to current functional performance    Occupational performance deficits (Please refer to evaluation for details): ADL's;Work;IADL's;Rest and Sleep;Leisure;Social Participation;Other    Body Structure / Function / Physical Skills ADL;Skin integrity;Endurance;Balance;Mobility;Strength;Edema;Obesity;Pain;Gait;ROM;Decreased knowledge of precautions;Decreased knowledge of use of DME;IADL    Clinical Decision Making Multiple treatment options, significant modification of task  necessary    Comorbidities Affecting Occupational Performance: Presence of comorbidities impacting occupational performance    Modification or Assistance to Complete Evaluation  Max significant modification of tasks or assist is necessary to complete    OT Frequency 2x / week    OT Duration 12 weeks    OT Treatment/Interventions Self-care/ADL training;Therapeutic exercise;Coping strategies training;Therapeutic activities;Manual lymph drainage;Energy conservation;Manual Therapy;DME and/or AE instruction;Compression bandaging;Patient/family education    Plan CDT to BLE, one leg at a time, RL:E first    OT Home Exercise Plan fit with BLE custom flat knit compression knee highs    Recommended Other Services Lymphedema management has a high burden of care, especially for Patients who have difficulty, or who are unable to reach their distal legs and feet.  reach their feet and distal legs to aapply bandages, compression garments, to bathe , inspect skin and groom nails. In this case because Pt is unable to perform these activities, daily caregiver assistance with the lymphedema self-care home program  between OT visits is essential for achieving clinical success. Without ongoing, daily caregiver assistance during compression bandaging, donning and doffing compression garments, skin care, and MLD , Pt's prognosis is poor. However, with consistent caregiver assistance with lymphedema home program prognosis for improved lymphedema control and reducing infection risk and progression is good.    Consulted and Agree with Plan of Care Patient             Patient will benefit from skilled therapeutic intervention in order to improve the following deficits and impairments:   Body Structure / Function / Physical Skills: ADL, Skin integrity, Endurance, Balance, Mobility, Strength, Edema, Obesity, Pain, Gait, ROM, Decreased knowledge of precautions, Decreased knowledge of use of DME, IADL       Visit  Diagnosis: Lymphedema, not elsewhere classified    Problem List Patient Active Problem List   Diagnosis Date Noted   Osteoarthritis 10/04/2019   Spondylodiscitis 04/26/2019   Lymphedema 04/26/2019   Morbid obesity ([place]) 04/26/2019   BMI 45.0-49.9, adult ([place]) 08/20/2018   Controlled type 2 diabetes mellitus without complication, without long-term current use of insulin ([place]) 11/26/2017   GERD (gastroesophageal reflux disease) 11/26/2017   Hyperlipidemia, mixed 11/26/2017   Hypertension, essential 11/26/2017   Chest pain at rest 11/26/2017    Andrey Spearman, MS, OTR/L, CLT-LANA 08/01/21 1:05 PM   [place]  [facility] MAIN [facility] SERVICES [address] [place], [place], [address] Phone: [phone]   Fax:  [phone]  Name: WESTLYN GLAZA MRN: [id number] Date of Birth: 12/21/1955

## 2021-08-02 ENCOUNTER — Ambulatory Visit (INDEPENDENT_AMBULATORY_CARE_PROVIDER_SITE_OTHER): Payer: Medicare PPO | Admitting: Vascular Surgery

## 2021-08-02 ENCOUNTER — Encounter (INDEPENDENT_AMBULATORY_CARE_PROVIDER_SITE_OTHER): Payer: Self-pay | Admitting: Vascular Surgery

## 2021-08-02 VITALS — BP 141/81 | HR 79 | Ht 63.0 in | Wt 320.0 lb

## 2021-08-02 DIAGNOSIS — I739 Peripheral vascular disease, unspecified: Secondary | ICD-10-CM

## 2021-08-02 DIAGNOSIS — I10 Essential (primary) hypertension: Secondary | ICD-10-CM | POA: Diagnosis not present

## 2021-08-02 DIAGNOSIS — I890 Lymphedema, not elsewhere classified: Secondary | ICD-10-CM

## 2021-08-02 DIAGNOSIS — K219 Gastro-esophageal reflux disease without esophagitis: Secondary | ICD-10-CM

## 2021-08-02 DIAGNOSIS — E782 Mixed hyperlipidemia: Secondary | ICD-10-CM

## 2021-08-05 ENCOUNTER — Encounter (INDEPENDENT_AMBULATORY_CARE_PROVIDER_SITE_OTHER): Payer: Self-pay | Admitting: Vascular Surgery

## 2021-08-10 ENCOUNTER — Other Ambulatory Visit: Payer: Self-pay

## 2021-08-10 ENCOUNTER — Ambulatory Visit: Payer: Medicare PPO | Admitting: Occupational Therapy

## 2021-08-10 DIAGNOSIS — I890 Lymphedema, not elsewhere classified: Secondary | ICD-10-CM

## 2021-08-10 NOTE — Therapy (Signed)
[place] MAIN [facility] SERVICES [address] [place], [place], [address] Phone: [phone]   Fax:  [phone]  Occupational Therapy Treatment  Patient Details  Name: KEMESHA MOSEY MRN: [id number] Date of Birth: 1956/04/21 Referring Provider (OT): Eulogio Ditch, NP   Encounter Date: 08/10/2021   OT End of Session - 08/10/21 0905     Visit Number 41    Number of Visits 72    Date for OT Re-Evaluation 09/05/21    OT Start Time 0900    OT Stop Time 1000    OT Time Calculation (min) 60 min    Activity Tolerance Patient tolerated treatment well;No increased pain;Patient limited by pain    Behavior During Therapy [facility] for tasks assessed/performed             Past Medical History:  Diagnosis Date   Diabetes mellitus without complication ([place])    DJD (degenerative joint disease)    GERD (gastroesophageal reflux disease)    Hypercholesteremia    Hypertension    Morbid obesity with BMI of 50.0-59.9, adult ([place])    Neuropathy    Phlegmon    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   Subjective Assessment - 08/10/21 1208     Subjective  Ms Tolliver presents for OT Rx visit 41 /72 to address BLE lymphedema. Pt arrives seated in a transport wc with new RLE compression knee high in place and LLE wraps in place. Pt reports 6/10 bilateral knee pain.Pt reports her LLE compression garment arrived at her home during interval. Pt reports she was informed insurance denied her request for Flexitouch device.    Pertinent History Spondylodiscitis, BLE lymphedema, HTN, DM, neuropathy, DJD, arthritis, sciatica,  chronic back pain, morbid obesity, (BMI 50-59.9)     Limitations Difficulty walking, impaired transfers/functional mobility, chronic back DJD and knee  OA pain , chronic leg swelling decreased strength, impaired balance, impaired sensation (neuropathy in feet), increased infection risk, increased fall risk,    Repetition Increases Symptoms    Special Tests + Stemmer bilaterally    Patient Stated Goals get swelling back down and replace worn compression stockings to limit LE progression    Pain Onset Other (comment)   chronic                         OT Treatments/Exercises (OP) - 08/10/21 1209       ADLs   ADL Education Given Yes      Manual Therapy   Manual Therapy Edema management;Manual Lymphatic Drainage (MLD);Other (comment)    Compression Bandaging fitted LLE Elvarex custom flat knit ccl3 (34-46 mmHg) knee length compression stocking. Need remake to adjust fot length.                    OT Education - 08/10/21 1210     Education Details Continued skilled Pt/caregiver education  And LE ADL training throughout visit for lymphedema self care/ home program, including compression wrapping, compression garment and device wear/care, lymphatic pumping ther ex, simple self-MLD, and skin care. Discussed progress towards all goals todate.  Person(s) Educated Patient    Methods Explanation;Demonstration    Comprehension Verbalized understanding;Need further instruction;Returned demonstration                 OT Long Term Goals - 08/01/21 1100       OT LONG TERM GOAL #1   Title Pt will demonstrate understanding of lymphedema precautions and signs/ symptoms of cellulitis by identifying 6 items for each using the printed Lymphedema Workbook for reference PRN (modified independence) to limit LE progression and infection risk.    Baseline minA    Time 4    Period Days    Status Achieved      OT LONG TERM GOAL #2   Title Pt will require Max assist from caregiver who will be able to apply multilayer, gradient  compression wraps using correct techniques after skilled training to reduce limb volume and limity LE progression.    Baseline dependent    Period Days    Status Achieved      OT LONG TERM GOAL #3   Title Pt will achieve 10% limb volume reductions  bilaterally from ankle to tibial tuberosity during Intensive Phase CDT to return limbs to typical size and shape ,. reduce infection risk and improve basic andf instrumental ALDs performance.    Baseline intake volume RLE A-D= 9886.0 ml. intake volume LLE A-D= 10309.9 ml.   Achieved for RLE w/ 20.63% reduction on visit 20, 05/16/21.   Time 12    Period Weeks    Status Achieved   R leg limb volume reduction to date measures 20.63%. L leg volume reductionh to date = 28.3%. Goal met bilaterally     OT LONG TERM GOAL #4   Title Pt will consistently perform all LE self -care home protocols ( modified simple self MLD, skin care, therapeutic exercise, compression therapies) daily  with max CG assistance to to achieve max edema reduction, to enanble functional improvements,  including fitting preferred street shoes and standard sized clothing, and to achieve increased AROM and reduced falls risk.    Baseline Max A    Time 12    Period Weeks    Status Achieved   Pt unable to perform simple self-MLD due to limited AROM and pain. Basic sequential pump trial failed as significant LE symptoms persist. Trial of advanced Flexitouch device was recently completed and results of insurance benefits is pending.   Target Date 06/07/21      OT LONG TERM GOAL #5   Title Pt will demonstrate an increase in functional score on the FOTO assessment (Funtional utcomes) of 5 points above intyake score to demonstrate improvement in functional mobility .    Baseline Intake FOTO= 32\100    Time 12    Period Weeks    Status Achieved   Intake FOTO= 32\100. 12/19 value= 40; 06/27/21 value= 35     Long Term Additional Goals   Additional Long Term Goals Yes                    Plan - 08/10/21 1203     Clinical Impression Statement Pt notiied by her insurance provider that they denied Flexitouch device deeming it not medically necessary. Printed financial assistance form for [employer]. Pt will complete and submit in effort to find alternative funding for this much needed, medically necessary device for self management. Fitted LLE custom [employer] Elvarex, ccl 3 knee high. Garment fit OK in leg portion , but foot portion is too long  by several centimeters. Took photos to send to DME vendor with problem list. Checked measurements and they are correct indicating a manufacturing error. Requested remake. Remainder of session spent on MLD to LLE above the knee using established pathways. Cont as per POC.    OT Occupational Profile and History Comprehensive Assessment- Review of records and extensive additional review of physical, cognitive, psychosocial history related to current functional performance    Occupational performance deficits (Please refer to evaluation for details): ADL's;Work;IADL's;Rest and Sleep;Leisure;Social Participation;Other    Body Structure / Function / Physical Skills ADL;Skin integrity;Endurance;Balance;Mobility;Strength;Edema;Obesity;Pain;Gait;ROM;Decreased knowledge of precautions;Decreased knowledge of use of DME;IADL    Clinical Decision Making Multiple treatment options, significant modification of task necessary    Comorbidities Affecting Occupational Performance: Presence of comorbidities impacting occupational performance    Modification or Assistance to Complete Evaluation  Max significant modification of tasks or assist is necessary to complete    OT Frequency 2x / week    OT Duration 12 weeks    OT Treatment/Interventions Self-care/ADL training;Therapeutic exercise;Coping strategies training;Therapeutic activities;Manual lymph drainage;Energy conservation;Manual Therapy;DME and/or AE instruction;Compression  bandaging;Patient/family education    Plan CDT to BLE, one leg at a time, RL:E first    OT Home Exercise Plan fit with BLE custom flat knit compression knee highs    Recommended Other Services Lymphedema management has a high burden of care, especially for Patients who have difficulty, or who are unable to reach their distal legs and feet.  reach their feet and distal legs to aapply bandages, compression garments, to bathe , inspect skin and groom nails. In this case because Pt is unable to perform these activities, daily caregiver assistance with the lymphedema self-care home program  between OT visits is essential for achieving clinical success. Without ongoing, daily caregiver assistance during compression bandaging, donning and doffing compression garments, skin care, and MLD , Pt's prognosis is poor. However, with consistent caregiver assistance with lymphedema home program prognosis for improved lymphedema control and reducing infection risk and progression is good.    Consulted and Agree with Plan of Care Patient             Patient will benefit from skilled therapeutic intervention in order to improve the following deficits and impairments:   Body Structure / Function / Physical Skills: ADL, Skin integrity, Endurance, Balance, Mobility, Strength, Edema, Obesity, Pain, Gait, ROM, Decreased knowledge of precautions, Decreased knowledge of use of DME, IADL       Visit Diagnosis: Lymphedema, not elsewhere classified    Problem List Patient Active Problem List   Diagnosis Date Noted   PAD (peripheral artery disease) ([place]) 08/01/2021   Osteoarthritis 10/04/2019   Spondylodiscitis 04/26/2019   Lymphedema 04/26/2019   Morbid obesity ([place]) 04/26/2019   BMI 45.0-49.9, adult ([place]) 08/20/2018   Controlled type 2 diabetes mellitus without complication, without long-term current use of insulin ([place]) 11/26/2017   GERD (gastroesophageal reflux disease) 11/26/2017   Hyperlipidemia, mixed  11/26/2017   Hypertension, essential 11/26/2017   Chest pain at rest 11/26/2017    Andrey Spearman, MS, OTR/L, [facility] 08/10/21 12:11 PM   [place] MAIN [facility] SERVICES [place], [place], [address] Phone: [phone]   Fax:  [phone]  Name: NEVA RAMASWAMY MRN: [id number] Date of Birth: [birth year]

## 2021-08-15 ENCOUNTER — Other Ambulatory Visit: Payer: Self-pay

## 2021-08-15 ENCOUNTER — Ambulatory Visit: Payer: Medicare PPO | Admitting: Occupational Therapy

## 2021-08-15 DIAGNOSIS — I890 Lymphedema, not elsewhere classified: Secondary | ICD-10-CM | POA: Diagnosis not present

## 2021-08-15 NOTE — Therapy (Signed)
?Nancy Riley ?[place], [place], [address] ?Phone: [phone]   Fax:  [phone] ? ?Occupational Therapy Treatment ? ?Patient Details  ?Name: Nancy Riley ?MRN: [id number] ?Date of Birth: 05-May-1956 ?Referring Provider (OT): Eulogio Ditch, NP ? ? ?Encounter Date: 08/15/2021 ? ? OT End of Session - 08/15/21 1118   ? ? Visit Number 42   ? Number of Visits 72   ? Date for OT Re-Evaluation 09/05/21   ? OT Start Time 1107   ? OT Stop Time 1207   ? OT Time Calculation (min) 60 min   ? Activity Tolerance Patient tolerated treatment well;No increased pain;Patient limited by pain   ? Behavior During Therapy [facility] for tasks assessed/performed   ? ?  ?  ? ?  ? ? ?Past Medical History:  ?Diagnosis Date  ? Diabetes mellitus without complication ([place])   ? DJD (degenerative joint disease)   ? GERD (gastroesophageal reflux disease)   ? Hypercholesteremia   ? Hypertension   ? Morbid obesity with BMI of 50.0-59.9, adult ([place])   ? Neuropathy   ? Phlegmon   ? 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 in Nov 2020  ? Plantar fasciitis   ? Sciatica   ? Spondylodiscitis   ? lumbar spine (Nov 2020 at [facility])  ? ? ?Past Surgical History:  ?Procedure Laterality Date  ? BREAST EXCISIONAL BIOPSY Left 1970  ? negative  ? COLONOSCOPY    ? ? ?There were no vitals filed for this visit. ? ? Subjective Assessment - 08/15/21 1122   ? ? Subjective  Nancy Riley presents for OT Rx visit 42 /72 to address BLE lymphedema. Pt arrives seated in a transport wc with new RLE compression knee high in place and LLE wraps in place. Pt reports 10/10 bilateral arthritis knee pain.Pt denies LE-related pain. She ttells me both garments are fitting well and appear to be controlling swelling.   ? Pertinent History Spondylodiscitis, BLE lymphedema, HTN, DM, neuropathy, DJD, arthritis, sciatica,  chronic back pain, morbid obesity, (BMI 50-59.9)   ? Limitations Difficulty walking,  impaired transfers/functional mobility, chronic back DJD and knee  OA pain , chronic leg swelling decreased strength, impaired balance, impaired sensation (neuropathy in feet), increased infection risk, increased fall risk,   ? Repetition Increases Symptoms   ? Special Tests + Stemmer bilaterally   ? Patient Stated Goals get swelling back down and replace worn compression stockings to limit LE progression   ? Currently in Pain? No/denies   ? Pain Onset Other (comment)   chronic  ? ?  ?  ? ?  ? ? ? ? ? ? ? ? ? ? ? ? ? ? ? OT Treatments/Exercises (OP) - 08/15/21 1235   ? ?  ? ADLs  ? ADL Education Given Yes   ?  ? Manual Therapy  ? Manual Therapy Edema management   ? Compression Bandaging Modified BLE HOS devices to fit Pt. These devices are medically necessary to promote increased lymphatic flow   and reduce build up of fibrosis during HOS.   ? ?  ?  ? ?  ? ? ? ? ? ? ? ? ? OT Education - 08/15/21 1237   ? ? Education Details Continued skilled Pt/caregiver education  And LE ADL training throughout visit for lymphedema self care/ home program, including compression wrapping, compression garment and device wear/care, lymphatic pumping ther ex, simple self-MLD, and skin care.  Discussed progress towards all goals todate.   ? Person(s) Educated Patient   ? Methods Explanation;Demonstration   ? Comprehension Verbalized understanding;Need further instruction;Returned demonstration   ? ?  ?  ? ?  ? ? ? ? ? ? OT Long Term Goals - 08/01/21 1100   ? ?  ? OT LONG TERM GOAL #1  ? Title Pt will demonstrate understanding of lymphedema precautions and signs/ symptoms of cellulitis by identifying 6 items for each using the printed Lymphedema Workbook for reference PRN (modified independence) to limit LE progression and infection risk.   ? Baseline minA   ? Time 4   ? Period Days   ? Status Achieved   ?  ? OT LONG TERM GOAL #2  ? Title Pt will require Max assist from caregiver who will be able to apply multilayer, gradient compression  wraps using correct techniques after skilled training to reduce limb volume and limity LE progression.   ? Baseline dependent   ? Period Days   ? Status Achieved   ?  ? OT LONG TERM GOAL #3  ? Title Pt will achieve 10% limb volume reductions  bilaterally from ankle to tibial tuberosity during Intensive Phase CDT to return limbs to typical size and shape ,. reduce infection risk and improve basic andf instrumental ALDs performance.   ? Baseline intake volume RLE A-D= 9886.0 ml. intake volume LLE A-D= 10309.9 ml.   Achieved for RLE w/ 20.63% reduction on visit 20, 05/16/21.  ? Time 12   ? Period Weeks   ? Status Achieved   R leg limb volume reduction to date measures 20.63%. L leg volume reductionh to date = 28.3%. Goal met bilaterally  ?  ? OT LONG TERM GOAL #4  ? Title Pt will consistently perform all LE self -care home protocols ( modified simple self MLD, skin care, therapeutic exercise, compression therapies) daily  with max CG assistance to to achieve max edema reduction, to enanble functional improvements,  including fitting preferred street shoes and standard sized clothing, and to achieve increased AROM and reduced falls risk.   ? Baseline Max A   ? Time 12   ? Period Weeks   ? Status Achieved   Pt unable to perform simple self-MLD due to limited AROM and pain. Basic sequential pump trial failed as significant LE symptoms persist. Trial of advanced Flexitouch device was recently completed and results of insurance benefits is pending.  ? Target Date 06/07/21   ?  ? OT LONG TERM GOAL #5  ? Title Pt will demonstrate an increase in functional score on the FOTO assessment (Funtional utcomes) of 5 points above intyake score to demonstrate improvement in functional mobility .   ? Baseline Intake FOTO= 32\100   ? Time 12   ? Period Weeks   ? Status Achieved   Intake FOTO= 32\100. 12/19 value= 40; 06/27/21 value= 35  ?  ? Long Term Additional Goals  ? Additional Long Term Goals Yes   ? ?  ?  ? ?  ? ? ? ? ? ? ? ? Plan -  08/15/21 1228   ? ? Clinical Impression Statement Session spent modifying and fitting BLE HOS devices which are leftover REMAKES. Both devices are usable, but are too long. Marked and trimmed foot opening and oblique top edge of one  to fit. Pt will work with these during visit interval in an effort to don and doff using existing assistive devices, and to build tolerancew  for overnight wear. Cont as per POC.   ? OT Occupational Profile and History Comprehensive Assessment- Review of records and extensive additional review of physical, cognitive, psychosocial history related to current functional performance   ? Occupational performance deficits (Please refer to evaluation for details): ADL's;Work;IADL's;Rest and Sleep;Leisure;Social Participation;Other   ? Body Structure / Function / Physical Skills ADL;Skin integrity;Endurance;Balance;Mobility;Strength;Edema;Obesity;Pain;Gait;ROM;Decreased knowledge of precautions;Decreased knowledge of use of DME;IADL   ? Clinical Decision Making Multiple treatment options, significant modification of task necessary   ? Comorbidities Affecting Occupational Performance: Presence of comorbidities impacting occupational performance   ? Modification or Assistance to Complete Evaluation  Max significant modification of tasks or assist is necessary to complete   ? OT Frequency 2x / week   ? OT Duration 12 weeks   ? OT Treatment/Interventions Self-care/ADL training;Therapeutic exercise;Coping strategies training;Therapeutic activities;Manual lymph drainage;Energy conservation;Manual Therapy;DME and/or AE instruction;Compression bandaging;Patient/family education   ? Plan CDT to BLE, one leg at a time, RL:E first   ? OT Home Exercise Plan fit with BLE custom flat knit compression knee highs   ? Recommended Other Riley Lymphedema management has a high burden of care, especially for Patients who have difficulty, or who are unable to reach their distal legs and feet.  reach their feet and  distal legs to aapply bandages, compression garments, to bathe , inspect skin and groom nails. In this case because Pt is unable to perform these activities, daily caregiver assistance with the lymphede

## 2021-08-15 NOTE — Patient Instructions (Signed)

## 2021-08-17 ENCOUNTER — Ambulatory Visit: Payer: Medicare PPO | Admitting: Occupational Therapy

## 2021-08-17 ENCOUNTER — Other Ambulatory Visit: Payer: Self-pay

## 2021-08-17 DIAGNOSIS — I890 Lymphedema, not elsewhere classified: Secondary | ICD-10-CM

## 2021-08-17 NOTE — Patient Instructions (Signed)

## 2021-08-17 NOTE — Therapy (Signed)
[place] [facility] ?[place], [place], [address] ?Phone: [phone]   Fax:  [phone] ? ?Occupational Therapy Treatment ? ?Patient Details  ?Name: Nancy Riley ?MRN: [id number] ?Date of Birth: 09/09/1955 ?Referring Provider (OT): Eulogio Ditch, NP ? ? ?Encounter Date: 08/17/2021 ? ? OT End of Session - 08/17/21 0906   ? ? Visit Number 28   ? Number of Visits 72   ? Date for OT Re-Evaluation 09/05/21   ? OT Start Time 0900   ? OT Stop Time 1000   ? OT Time Calculation (min) 60 min   ? Activity Tolerance Patient tolerated treatment well;No increased pain;Patient limited by pain   ? Behavior During Therapy [facility] for tasks assessed/performed   ? ?  ?  ? ?  ? ? ?Past Medical History:  ?Diagnosis Date  ? Diabetes mellitus without complication ([place])   ? DJD (degenerative joint disease)   ? GERD (gastroesophageal reflux disease)   ? Hypercholesteremia   ? Hypertension   ? Morbid obesity with BMI of 50.0-59.9, adult ([place])   ? Neuropathy   ? Phlegmon   ? ventral epidural phlegmon identified on lumbar spine MRI at [facility] in Nov 2020  ? Plantar fasciitis   ? Sciatica   ? Spondylodiscitis   ? lumbar spine (Nov 2020 at [facility])  ? ? ?Past Surgical History:  ?Procedure Laterality Date  ? BREAST EXCISIONAL BIOPSY Left 1970  ? negative  ? COLONOSCOPY    ? ? ?There were no vitals filed for this visit. ? ? Subjective Assessment - 08/17/21 1230   ? ? Subjective  Ms Brissette presents for OT Rx visit 43 /72 to address BLE lymphedema. Pt arrives seated in a transport wc with new RLE compression knee high in place and LLE wraps in place. Pt reports 10/10 bilateral arthritis knee pain.Pt denies LE-related pain. Pt has no new concerns.   ? Pertinent History Spondylodiscitis, BLE lymphedema, HTN, DM, neuropathy, DJD, arthritis, sciatica,  chronic back pain, morbid obesity, (BMI 50-59.9)   ? Limitations Difficulty walking, impaired transfers/functional mobility, chronic back DJD and  knee  OA pain , chronic leg swelling decreased strength, impaired balance, impaired sensation (neuropathy in feet), increased infection risk, increased fall risk,   ? Repetition Increases Symptoms   ? Special Tests + Stemmer bilaterally   ? Patient Stated Goals get swelling back down and replace worn compression stockings to limit LE progression   ? Pain Onset Other (comment)   chronic  ? ?  ?  ? ?  ? ? ? ? ? ? ? ? ? ? ? ? ? ? ? OT Treatments/Exercises (OP) - 08/17/21 1230   ? ?  ? ADLs  ? ADL Education Given Yes   ?  ? Manual Therapy  ? Manual Therapy Edema management   ? Manual Lymphatic Drainage (MLD) MLD to LLE/LLQ with simultaneous skin care to increase skin flexibility and to increase hydration. Utilized modified sjort neck sequence  omitting lateral neck strokes and performing J strokes to clavicular regions only. Pt instructed in diaphragmatic breathing during MLD. Utilized functional inguinal LN and deep adominal pathways working from proximal to distal through functional inguinal LN/. No c/o pain    throughout Rx.   ? Compression Bandaging BLE custom compression garments- recent replacements. Still waiting on remake.   ? ?  ?  ? ?  ? ? ? ? ? ? ? ? ? OT Education - 08/17/21 1231   ? ?  Education Details Continued skilled Pt/caregiver education  And LE ADL training throughout visit for lymphedema self care/ home program, including compression wrapping, compression garment and device wear/care, lymphatic pumping ther ex, simple self-MLD, and skin care. Discussed progress towards all goals todate.   ? Person(s) Educated Patient   ? Methods Explanation;Demonstration   ? Comprehension Verbalized understanding;Need further instruction;Returned demonstration   ? ?  ?  ? ?  ? ? ? ? ? ? OT Long Term Goals - 08/01/21 1100   ? ?  ? OT LONG TERM GOAL #1  ? Title Pt will demonstrate understanding of lymphedema precautions and signs/ symptoms of cellulitis by identifying 6 items for each using the printed Lymphedema  Workbook for reference PRN (modified independence) to limit LE progression and infection risk.   ? Baseline minA   ? Time 4   ? Period Days   ? Status Achieved   ?  ? OT LONG TERM GOAL #2  ? Title Pt will require Max assist from caregiver who will be able to apply multilayer, gradient compression wraps using correct techniques after skilled training to reduce limb volume and limity LE progression.   ? Baseline dependent   ? Period Days   ? Status Achieved   ?  ? OT LONG TERM GOAL #3  ? Title Pt will achieve 10% limb volume reductions  bilaterally from ankle to tibial tuberosity during Intensive Phase CDT to return limbs to typical size and shape ,. reduce infection risk and improve basic andf instrumental ALDs performance.   ? Baseline intake volume RLE A-D= 9886.0 ml. intake volume LLE A-D= 10309.9 ml.   Achieved for RLE w/ 20.63% reduction on visit 20, 05/16/21.  ? Time 12   ? Period Weeks   ? Status Achieved   R leg limb volume reduction to date measures 20.63%. L leg volume reductionh to date = 28.3%. Goal met bilaterally  ?  ? OT LONG TERM GOAL #4  ? Title Pt will consistently perform all LE self -care home protocols ( modified simple self MLD, skin care, therapeutic exercise, compression therapies) daily  with max CG assistance to to achieve max edema reduction, to enanble functional improvements,  including fitting preferred street shoes and standard sized clothing, and to achieve increased AROM and reduced falls risk.   ? Baseline Max A   ? Time 12   ? Period Weeks   ? Status Achieved   Pt unable to perform simple self-MLD due to limited AROM and pain. Basic sequential pump trial failed as significant LE symptoms persist. Trial of advanced Flexitouch device was recently completed and results of insurance benefits is pending.  ? Target Date 06/07/21   ?  ? OT LONG TERM GOAL #5  ? Title Pt will demonstrate an increase in functional score on the FOTO assessment (Funtional utcomes) of 5 points above intyake  score to demonstrate improvement in functional mobility .   ? Baseline Intake FOTO= 32\100   ? Time 12   ? Period Weeks   ? Status Achieved   Intake FOTO= 32\100. 12/19 value= 40; 06/27/21 value= 35  ?  ? Long Term Additional Goals  ? Additional Long Term Goals Yes   ? ?  ?  ? ?  ? ? ? ? ? ? ? ? Plan - 08/17/21 1228   ? ? Clinical Impression Statement Continued  LLE/LLQ MLD utilizing functional LN in seated position w leg elevated on adjustable foot stool . Provided skin care  throughout MLD with low ph castor oil to increase skin mobility and hydration. Reapplied custom compression garment   with max assist to save time.  Good tolerance for manual therapy without pain. Pt agrees with plan to reduce OT frequency to 1 x weekly while awaiting Remake fitting.   ? OT Occupational Profile and History Comprehensive Assessment- Review of records and extensive additional review of physical, cognitive, psychosocial history related to current functional performance   ? Occupational performance deficits (Please refer to evaluation for details): ADL's;Work;IADL's;Rest and Sleep;Leisure;Social Participation;Other   ? Body Structure / Function / Physical Skills ADL;Skin integrity;Endurance;Balance;Mobility;Strength;Edema;Obesity;Pain;Gait;ROM;Decreased knowledge of precautions;Decreased knowledge of use of DME;IADL   ? Clinical Decision Making Multiple treatment options, significant modification of task necessary   ? Comorbidities Affecting Occupational Performance: Presence of comorbidities impacting occupational performance   ? Modification or Assistance to Complete Evaluation  Max significant modification of tasks or assist is necessary to complete   ? OT Frequency 2x / week   ? OT Duration 12 weeks   ? OT Treatment/Interventions Self-care/ADL training;Therapeutic exercise;Coping strategies training;Therapeutic activities;Manual lymph drainage;Energy conservation;Manual Therapy;DME and/or AE instruction;Compression  bandaging;Patient/family education   ? Plan CDT to BLE, one leg at a time, RL:E first   ? OT Home Exercise Plan fit with BLE custom flat knit compression knee highs   ? Recommended Other Services Lymphedema management has a h

## 2021-08-22 ENCOUNTER — Ambulatory Visit: Payer: Medicare PPO | Admitting: Occupational Therapy

## 2021-08-22 ENCOUNTER — Other Ambulatory Visit: Payer: Self-pay

## 2021-08-22 DIAGNOSIS — I890 Lymphedema, not elsewhere classified: Secondary | ICD-10-CM | POA: Diagnosis not present

## 2021-08-22 NOTE — Therapy (Signed)
[facility] ?[place], [place], [address] ?Phone: [phone]   Fax:  [phone] ? ?Occupational Therapy Treatment ? ?Patient Details  ?Name: Nancy Riley ?MRN: [id number] ?Date of Birth: 1955/09/15 ?Referring Provider (OT): Eulogio Ditch, NP ? ? ?Encounter Date: 08/22/2021 ? ? OT End of Session - 08/22/21 1230   ? ? Visit Number 44   ? Number of Visits 72   ? Date for OT Re-Evaluation 09/05/21   ? OT Start Time 1100   ? OT Stop Time 1200   ? OT Time Calculation (min) 60 min   ? Activity Tolerance Patient tolerated treatment well;No increased pain;Patient limited by pain   ? Behavior During Therapy [facility] for tasks assessed/performed   ? ?  ?  ? ?  ? ? ?Past Medical History:  ?Diagnosis Date  ? Diabetes mellitus without complication ([place])   ? DJD (degenerative joint disease)   ? GERD (gastroesophageal reflux disease)   ? Hypercholesteremia   ? Hypertension   ? Morbid obesity with BMI of 50.0-59.9, adult ([place])   ? Neuropathy   ? Phlegmon   ? 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 Plantar fasciitis   ? Sciatica   ? Spondylodiscitis   ? lumbar spine (Nov 2020 at [facility])  ? ? ?Past Surgical History:  ?Procedure Laterality Date  ? BREAST EXCISIONAL BIOPSY Left 1970  ? negative  ? COLONOSCOPY    ? ? ?There were no vitals filed for this visit. ? ? Subjective Assessment - 08/22/21 1243   ? ? Subjective  Nancy Riley presents for OT Rx visit 44 /72 to address BLE lymphedema. Pt arrives seated in a transport wc with new RLE compression knee high in place and LLE wraps in place. denies LE-related pain. Pt has no new concerns. Pt brings remade L custom leg compression gatrment for fitting.   ? Pertinent History Spondylodiscitis, BLE lymphedema, HTN, DM, neuropathy, DJD, arthritis, sciatica,  chronic back pain, morbid obesity, (BMI 50-59.9)   ? Limitations Difficulty walking, impaired transfers/functional mobility, chronic  back DJD and knee  OA pain , chronic leg swelling decreased strength, impaired balance, impaired sensation (neuropathy in feet), increased infection risk, increased fall risk,   ? Repetition Increases Symptoms   ? Special Tests + Stemmer bilaterally   ? Patient Stated Goals get swelling back down and replace worn compression stockings to limit LE progression   ? Pain Onset Other (comment)   chronic  ? ?  ?  ? ?  ? ? ? ? ? ? ? ? ? ? ? ? ? ? ? ? ? ? ? ? ? ? ? OT Education - 08/22/21 1244   ? ? Education Details Continued skilled Pt/caregiver education  And LE ADL training throughout visit for lymphedema self care/ home program, including compression wrapping, compression garment and device wear/care, lymphatic pumping ther ex, simple self-MLD, and skin care. Discussed progress towards all goals todate.   ? Person(s) Educated Patient   ? Methods Explanation;Demonstration   ? Comprehension Verbalized understanding;Need further instruction;Returned demonstration   ? ?  ?  ? ?  ? ? ? ? ? ? OT Long Term Goals - 08/01/21 1100   ? ?  ? OT LONG TERM GOAL #1  ? Title Pt will demonstrate understanding of lymphedema precautions and signs/ symptoms of cellulitis by identifying 6 items for each using the printed Lymphedema Workbook for reference  PRN (modified independence) to limit LE progression and infection risk.   ? Baseline minA   ? Time 4   ? Period Days   ? Status Achieved   ?  ? OT LONG TERM GOAL #2  ? Title Pt will require Max assist from caregiver who will be able to apply multilayer, gradient compression wraps using correct techniques after skilled training to reduce limb volume and limity LE progression.   ? Baseline dependent   ? Period Days   ? Status Achieved   ?  ? OT LONG TERM GOAL #3  ? Title Pt will achieve 10% limb volume reductions  bilaterally from ankle to tibial tuberosity during Intensive Phase CDT to return limbs to typical size and shape ,. reduce infection risk and improve basic andf instrumental ALDs  performance.   ? Baseline intake volume RLE A-D= 9886.0 ml. intake volume LLE A-D= 10309.9 ml.   Achieved for RLE w/ 20.63% reduction on visit 20, 05/16/21.  ? Time 12   ? Period Weeks   ? Status Achieved   R leg limb volume reduction to date measures 20.63%. L leg volume reductionh to date = 28.3%. Goal met bilaterally  ?  ? OT LONG TERM GOAL #4  ? Title Pt will consistently perform all LE self -care home protocols ( modified simple self MLD, skin care, therapeutic exercise, compression therapies) daily  with max CG assistance to to achieve max edema reduction, to enanble functional improvements,  including fitting preferred street shoes and standard sized clothing, and to achieve increased AROM and reduced falls risk.   ? Baseline Max A   ? Time 12   ? Period Weeks   ? Status Achieved   Pt unable to perform simple self-MLD due to limited AROM and pain. Basic sequential pump trial failed as significant LE symptoms persist. Trial of advanced Flexitouch device was recently completed and results of insurance benefits is pending.  ? Target Date 06/07/21   ?  ? OT LONG TERM GOAL #5  ? Title Pt will demonstrate an increase in functional score on the FOTO assessment (Funtional utcomes) of 5 points above intyake score to demonstrate improvement in functional mobility .   ? Baseline Intake FOTO= 32\100   ? Time 12   ? Period Weeks   ? Status Achieved   Intake FOTO= 32\100. 12/19 value= 40; 06/27/21 value= 35  ?  ? Long Term Additional Goals  ? Additional Long Term Goals Yes   ? ?  ?  ? ?  ? ? ? ? ? ? ? ? Plan - 08/22/21 1230   ? ? Clinical Impression Statement Remake custom compression stocking for LLE arrived during visit interval so we completed garment fitting today. New garment is a perfect fit and Pt reports,  upon iniitial assessment, that it is very comfortable. Pt will wash and wear and we will complete functional assessment at next week's session. Pt reports she is able too use left over HOS devices that were  shipped in error by the manufacturer last year. Pt reports legs are  noticably less swollen in the morning after wearing the RELAX garments. After fitting provided R proximal MLD .Good tolerance for manual therapy without pain. Pt agrees with plan to rreturn next week to finalize fitting. If swelling is well controlled and Pt is happy w garments we'll reduce OT frequency to follow along and PRN status.   ? OT Occupational Profile and History Comprehensive Assessment- Review of records and  extensive additional review of physical, cognitive, psychosocial history related to current functional performance   ? Occupational performance deficits (Please refer to evaluation for details): ADL's;Work;IADL's;Rest and Sleep;Leisure;Social Participation;Other   ? Body Structure / Function / Physical Skills ADL;Skin integrity;Endurance;Balance;Mobility;Strength;Edema;Obesity;Pain;Gait;ROM;Decreased knowledge of precautions;Decreased knowledge of use of DME;IADL   ? Rehab Potential Good   ? Clinical Decision Making Multiple treatment options, significant modification of task necessary   ? Comorbidities Affecting Occupational Performance: Presence of comorbidities impacting occupational performance   ? Modification or Assistance to Complete Evaluation  Max significant modification of tasks or assist is necessary to complete   ? OT Frequency 1x / week   ? OT Duration Other (comment)   next week , then  q 3 minths and PRN  ? OT Treatment/Interventions Self-care/ADL training;Therapeutic exercise;Coping strategies training;Therapeutic activities;Manual lymph drainage;Energy conservation;Manual Therapy;DME and/or AE instruction;Compression bandaging;Patient/family education   ? Plan CDT to BLE, one leg at a time, RL:E first   ? OT Home Exercise Plan fit with BLE custom flat knit compression knee highs   ? Recommended Other Services Lymphedema management has a high burden of care, especially for Patients who have difficulty, or who are  unable to reach their distal legs and feet.  reach their feet and distal legs to aapply bandages, compression garments, to bathe , inspect skin and groom nails. In this case because Pt is unable to perform thes

## 2021-08-22 NOTE — Patient Instructions (Signed)

## 2021-08-24 ENCOUNTER — Encounter: Payer: Medicare PPO | Admitting: Occupational Therapy

## 2021-08-27 ENCOUNTER — Encounter: Payer: Medicare PPO | Admitting: Occupational Therapy

## 2021-08-29 ENCOUNTER — Ambulatory Visit: Payer: Medicare PPO | Admitting: Occupational Therapy

## 2021-08-29 ENCOUNTER — Other Ambulatory Visit: Payer: Self-pay

## 2021-08-29 DIAGNOSIS — I890 Lymphedema, not elsewhere classified: Secondary | ICD-10-CM | POA: Diagnosis not present

## 2021-08-29 NOTE — Patient Instructions (Signed)

## 2021-08-29 NOTE — Therapy (Signed)
[place] [facility] ?[facility] ?[place], [place], [address] ?Phone: [phone]   Fax:  [phone] ? ?Occupational Therapy Treatment ? ?Patient Details  ?Name: Nancy Riley ?MRN: [id number] ?Date of Birth: November 25, 1955 ?Referring Provider (OT): Eulogio Ditch, NP ? ? ?Encounter Date: 08/29/2021 ? ? OT End of Session - 08/29/21 1125   ? ? Visit Number 57   ? Number of Visits 72   ? Date for OT Re-Evaluation 09/05/21   ? OT Start Time 1115   ? Activity Tolerance Patient tolerated treatment well;No increased pain;Patient limited by pain   ? Behavior During Therapy [facility] for tasks assessed/performed   ? ?  ?  ? ?  ? ? ?Past Medical History:  ?Diagnosis Date  ? Diabetes mellitus without complication ([place])   ? DJD (degenerative joint disease)   ? GERD (gastroesophageal reflux disease)   ? Hypercholesteremia   ? Hypertension   ? Morbid obesity with BMI of 50.0-59.9, adult ([place])   ? Neuropathy   ? Phlegmon   ? ventral epidural phlegmon identified on lumbar spine MRI at [facility] in Nov 2020  ? Plantar fasciitis   ? Sciatica   ? Spondylodiscitis   ? lumbar spine (Nov 2020 at [facility])  ? ? ?Past Surgical History:  ?Procedure Laterality Date  ? BREAST EXCISIONAL BIOPSY Left 1970  ? negative  ? COLONOSCOPY    ? ? ?There were no vitals filed for this visit. ? ? Subjective Assessment - 08/29/21 1221   ? ? Subjective  Nancy Riley presents for OT Rx visit 45 /72 to address BLE lymphedema. Pt arrives seated in a transport wc with new compression garments in place.Pt reports LE related pain is unchanged since last visit. Pt reports compression garment refitted for LLE last visit is working well. She tells me it fits well and is comfortable. She is able to don and doff stockings with extra time and assistive devices we've practiced with in the past.   ? Pertinent History Spondylodiscitis, BLE lymphedema, HTN, DM, neuropathy, DJD, arthritis, sciatica,  chronic back pain, morbid obesity, (BMI  50-59.9)   ? Limitations Difficulty walking, impaired transfers/functional mobility, chronic back DJD and knee  OA pain , chronic leg swelling decreased strength, impaired balance, impaired sensation (neuropathy in feet), increased infection risk, increased fall risk,   ? Repetition Increases Symptoms   ? Special Tests + Stemmer bilaterally   ? Patient Stated Goals get swelling back down and replace worn compression stockings to limit LE progression   ? Pain Onset Other (comment)   chronic  ? ?  ?  ? ?  ? ? ? ? ? ? ? ? ? ? ? ? ? ? ? OT Treatments/Exercises (OP) - 08/29/21 1225   ? ?  ? ADLs  ? ADL Education Given Yes   ?  ? Manual Therapy  ? Manual Therapy Edema management;Compression Bandaging;Manual Lymphatic Drainage (MLD)   ? Manual Lymphatic Drainage (MLD) MLD to LLE/LLQ with simultaneous skin care to increase skin flexibility and to increase hydration. Utilized modified sjort neck sequence  omitting lateral neck strokes and performing J strokes to clavicular regions only. Pt instructed in diaphragmatic breathing during MLD. Utilized functional inguinal LN and deep adominal pathways working from proximal to distal through functional inguinal LN/. No c/o pain    throughout Rx.   ? Compression Bandaging BLE custom compression garments- recent replacements. fitting complete   ? ?  ?  ? ?  ? ? ? ? ? ? ? ? ?  OT Education - 08/29/21 1225   ? ? Education Details Continued skilled Pt/caregiver education  And LE ADL training throughout visit for lymphedema self care/ home program, including compression wrapping, compression garment and device wear/care, lymphatic pumping ther ex, simple self-MLD, and skin care. Discussed progress towards all goals todate.   ? Person(s) Educated Patient   ? Methods Explanation;Demonstration   ? Comprehension Verbalized understanding;Need further instruction;Returned demonstration   ? ?  ?  ? ?  ? ? ? ? ? ? OT Long Term Goals - 08/01/21 1100   ? ?  ? OT LONG TERM GOAL #1  ? Title Pt will  demonstrate understanding of lymphedema precautions and signs/ symptoms of cellulitis by identifying 6 items for each using the printed Lymphedema Workbook for reference PRN (modified independence) to limit LE progression and infection risk.   ? Baseline minA   ? Time 4   ? Period Days   ? Status Achieved   ?  ? OT LONG TERM GOAL #2  ? Title Pt will require Max assist from caregiver who will be able to apply multilayer, gradient compression wraps using correct techniques after skilled training to reduce limb volume and limity LE progression.   ? Baseline dependent   ? Period Days   ? Status Achieved   ?  ? OT LONG TERM GOAL #3  ? Title Pt will achieve 10% limb volume reductions  bilaterally from ankle to tibial tuberosity during Intensive Phase CDT to return limbs to typical size and shape ,. reduce infection risk and improve basic andf instrumental ALDs performance.   ? Baseline intake volume RLE A-D= 9886.0 ml. intake volume LLE A-D= 10309.9 ml.   Achieved for RLE w/ 20.63% reduction on visit 20, 05/16/21.  ? Time 12   ? Period Weeks   ? Status Achieved   R leg limb volume reduction to date measures 20.63%. L leg volume reductionh to date = 28.3%. Goal met bilaterally  ?  ? OT LONG TERM GOAL #4  ? Title Pt will consistently perform all LE self -care home protocols ( modified simple self MLD, skin care, therapeutic exercise, compression therapies) daily  with max CG assistance to to achieve max edema reduction, to enanble functional improvements,  including fitting preferred street shoes and standard sized clothing, and to achieve increased AROM and reduced falls risk.   ? Baseline Max A   ? Time 12   ? Period Weeks   ? Status Achieved   Pt unable to perform simple self-MLD due to limited AROM and pain. Basic sequential pump trial failed as significant LE symptoms persist. Trial of advanced Flexitouch device was recently completed and results of insurance benefits is pending.  ? Target Date 06/07/21   ?  ? OT LONG  TERM GOAL #5  ? Title Pt will demonstrate an increase in functional score on the FOTO assessment (Funtional utcomes) of 5 points above intyake score to demonstrate improvement in functional mobility .   ? Baseline Intake FOTO= 32\100   ? Time 12   ? Period Weeks   ? Status Achieved   Intake FOTO= 32\100. 12/19 value= 40; 06/27/21 value= 35  ?  ? Long Term Additional Goals  ? Additional Long Term Goals Yes   ? ?  ?  ? ?  ? ? ? ? ? ? ? ? Plan - 08/29/21 1126   ? ? Clinical Impression Statement Continued  LLE/LLQ MLD utilizing functional LN in seated position w leg  elevated on adjustable foot stool . Provided skin care throughout MLD with low ph castor oil to increase skin mobility and hydration. Reappliedcustom garment with max A at end of session to save time. Good tolerance for manual therapy without pain. Cont at reduced 1 x weekly frequency to  facilitate transition to self-,management phase of CDT.   ? OT Occupational Profile and History Comprehensive Assessment- Review of records and extensive additional review of physical, cognitive, psychosocial history related to current functional performance   ? Occupational performance deficits (Please refer to evaluation for details): ADL's;Work;IADL's;Rest and Sleep;Leisure;Social Participation;Other   ? Body Structure / Function / Physical Skills ADL;Skin integrity;Endurance;Balance;Mobility;Strength;Edema;Obesity;Pain;Gait;ROM;Decreased knowledge of precautions;Decreased knowledge of use of DME;IADL   ? Clinical Decision Making Multiple treatment options, significant modification of task necessary   ? Comorbidities Affecting Occupational Performance: Presence of comorbidities impacting occupational performance   ? Modification or Assistance to Complete Evaluation  Max significant modification of tasks or assist is necessary to complete   ? OT Frequency 2x / week   ? OT Duration 12 weeks   ? OT Treatment/Interventions Self-care/ADL training;Therapeutic exercise;Coping  strategies training;Therapeutic activities;Manual lymph drainage;Energy conservation;Manual Therapy;DME and/or AE instruction;Compression bandaging;Patient/family education   ? Plan CDT to BLE, one leg at a

## 2021-08-31 ENCOUNTER — Encounter: Payer: Medicare PPO | Admitting: Occupational Therapy

## 2021-09-03 ENCOUNTER — Encounter: Payer: Medicare PPO | Admitting: Occupational Therapy

## 2021-09-04 ENCOUNTER — Ambulatory Visit: Payer: Medicare PPO | Attending: Nurse Practitioner | Admitting: Occupational Therapy

## 2021-09-04 DIAGNOSIS — I890 Lymphedema, not elsewhere classified: Secondary | ICD-10-CM | POA: Diagnosis present

## 2021-09-04 NOTE — Therapy (Signed)
[facility] ?[place], [place], [address] ?Phone: [phone]   Fax:  [phone] ? ?Occupational Therapy Treatment ? ?Patient Details  ?Name: Nancy Riley ?MRN: [id number] ?Date of Birth: 1956/04/08 ?Referring Provider (OT): Eulogio Ditch, NP ? ? ?Encounter Date: 09/04/2021 ? ? OT End of Session - 09/04/21 1016   ? ? Visit Number 49   ? Number of Visits 72   ? Date for OT Re-Evaluation 09/05/21   ? OT Start Time 1000   ? OT Stop Time 1100   ? OT Time Calculation (min) 60 min   ? Activity Tolerance Patient tolerated treatment well;No increased pain;Patient limited by pain   ? Behavior During Therapy [facility] for tasks assessed/performed   ? ?  ?  ? ?  ? ? ?Past Medical History:  ?Diagnosis Date  ? Diabetes mellitus without complication ([place])   ? DJD (degenerative joint disease)   ? GERD (gastroesophageal reflux disease)   ? Hypercholesteremia   ? Hypertension   ? Morbid obesity with BMI of 50.0-59.9, adult ([place])   ? Neuropathy   ? Phlegmon   ? 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 Plantar fasciitis   ? Sciatica   ? Spondylodiscitis   ? lumbar spine (Nov 2020 at [facility])  ? ? ?Past Surgical History:  ?Procedure Laterality Date  ? BREAST EXCISIONAL BIOPSY Left 1970  ? negative  ? COLONOSCOPY    ? ? ?There were no vitals filed for this visit. ? ? Subjective Assessment - 09/04/21 1018   ? ? Subjective  Ms Arrowood presents for OT Rx visit 46/72 to address BLE lymphedema. Pt arrives seated in a transport wc with new compression garments in place. Pt reports knee pain is 5/10 this morning. She denies LE-related leg pain. Pt reports she went to the bank at [facility] yesterday, using a shopping cart as a walker.   ? Pertinent History Spondylodiscitis, BLE lymphedema, HTN, DM, neuropathy, DJD, arthritis, sciatica,  chronic back pain, morbid obesity, (BMI 50-59.9)   ? Limitations Difficulty walking, impaired  transfers/functional mobility, chronic back DJD and knee  OA pain , chronic leg swelling decreased strength, impaired balance, impaired sensation (neuropathy in feet), increased infection risk, increased fall risk,   ? Repetition Increases Symptoms   ? Special Tests + Stemmer bilaterally   ? Patient Stated Goals get swelling back down and replace worn compression stockings to limit LE progression   ? Pain Onset Other (comment)   chronic  ? Pain Score 5   ? Pain Location Knee   ? Pain Orientation Right;Left   ? ?  ?  ? ?  ? ? ? ? ? ? ? ? ? ? ? ? ? ? ? OT Treatments/Exercises (OP) - 09/04/21 1215   ? ?  ? Manual Therapy  ? Compression Bandaging BLE custom compression garments- recent replacements. fitting complete   ? ?  ?  ? ?  ? ? ? ? ? ? ? ? ? OT Education - 09/04/21 1215   ? ? Education Details Continued skilled Pt/caregiver education  And LE ADL training throughout visit for lymphedema self care/ home program, including compression wrapping, compression garment and device wear/care, lymphatic pumping ther ex, simple self-MLD, and skin care. Discussed progress towards all goals todate.   ? Person(s) Educated Patient   ? Methods Explanation;Demonstration   ? Comprehension Verbalized understanding;Need further instruction;Returned demonstration   ? ?  ?  ? ?  ? ? ? ? ? ?  OT Long Term Goals - 08/01/21 1100   ? ?  ? OT LONG TERM GOAL #1  ? Title Pt will demonstrate understanding of lymphedema precautions and signs/ symptoms of cellulitis by identifying 6 items for each using the printed Lymphedema Workbook for reference PRN (modified independence) to limit LE progression and infection risk.   ? Baseline minA   ? Time 4   ? Period Days   ? Status Achieved   ?  ? OT LONG TERM GOAL #2  ? Title Pt will require Max assist from caregiver who will be able to apply multilayer, gradient compression wraps using correct techniques after skilled training to reduce limb volume and limity LE progression.   ? Baseline dependent   ?  Period Days   ? Status Achieved   ?  ? OT LONG TERM GOAL #3  ? Title Pt will achieve 10% limb volume reductions  bilaterally from ankle to tibial tuberosity during Intensive Phase CDT to return limbs to typical size and shape ,. reduce infection risk and improve basic andf instrumental ALDs performance.   ? Baseline intake volume RLE A-D= 9886.0 ml. intake volume LLE A-D= 10309.9 ml.   Achieved for RLE w/ 20.63% reduction on visit 20, 05/16/21.  ? Time 12   ? Period Weeks   ? Status Achieved   R leg limb volume reduction to date measures 20.63%. L leg volume reductionh to date = 28.3%. Goal met bilaterally  ?  ? OT LONG TERM GOAL #4  ? Title Pt will consistently perform all LE self -care home protocols ( modified simple self MLD, skin care, therapeutic exercise, compression therapies) daily  with max CG assistance to to achieve max edema reduction, to enanble functional improvements,  including fitting preferred street shoes and standard sized clothing, and to achieve increased AROM and reduced falls risk.   ? Baseline Max A   ? Time 12   ? Period Weeks   ? Status Achieved   Pt unable to perform simple self-MLD due to limited AROM and pain. Basic sequential pump trial failed as significant LE symptoms persist. Trial of advanced Flexitouch device was recently completed and results of insurance benefits is pending.  ? Target Date 06/07/21   ?  ? OT LONG TERM GOAL #5  ? Title Pt will demonstrate an increase in functional score on the FOTO assessment (Funtional utcomes) of 5 points above intyake score to demonstrate improvement in functional mobility .   ? Baseline Intake FOTO= 32\100   ? Time 12   ? Period Weeks   ? Status Achieved   Intake FOTO= 32\100. 12/19 value= 40; 06/27/21 value= 35  ?  ? Long Term Additional Goals  ? Additional Long Term Goals Yes   ? ?  ?  ? ?  ? ? ? ? ? ? ? ? Plan - 09/04/21 1208   ? ? Clinical Impression Statement RLE swelling is dramatically increased today. LLE is minimally increased. Pt  thinks increase may be partiallly due to being on her feet quite a bit more than typical over the weekend, and possibly not sleeping in compression. She reports that up until yesterday the swelling was well controlled with new replacement compression garments. In any case, the new garments did not conotain increased swelling well at lowered , ccl 1 compression rate. We'll continue to follow volume trends in this patient  and troubleshoot solutions. Volume did not reduce after lengthy MLD session with fibrosis techniques today either. Cont  2 x weekly as per POC.   ? OT Occupational Profile and History Comprehensive Assessment- Review of records and extensive additional review of physical, cognitive, psychosocial history related to current functional performance   ? Occupational performance deficits (Please refer to evaluation for details): ADL's;Work;IADL's;Rest and Sleep;Leisure;Social Participation;Other   ? Body Structure / Function / Physical Skills ADL;Skin integrity;Endurance;Balance;Mobility;Strength;Edema;Obesity;Pain;Gait;ROM;Decreased knowledge of precautions;Decreased knowledge of use of DME;IADL   ? Clinical Decision Making Multiple treatment options, significant modification of task necessary   ? Comorbidities Affecting Occupational Performance: Presence of comorbidities impacting occupational performance   ? Modification or Assistance to Complete Evaluation  Max significant modification of tasks or assist is necessary to complete   ? OT Frequency 2x / week   ? OT Duration 12 weeks   ? OT Treatment/Interventions Self-care/ADL training;Therapeutic exercise;Coping strategies training;Therapeutic activities;Manual lymph drainage;Energy conservation;Manual Therapy;DME and/or AE instruction;Compression bandaging;Patient/family education   ? Plan CDT to BLE, one leg at a time, RL:E first   ? OT Home Exercise Plan fit with BLE custom flat knit compression knee highs   ? Recommended Other Services Lymphedema  management has a high burden of care, especially for Patients who have difficulty, or who are unable to reach their distal legs and feet.  reach their feet and distal legs to aapply bandages, compression garments, to bathe ,

## 2021-09-04 NOTE — Patient Instructions (Signed)

## 2021-09-07 ENCOUNTER — Encounter: Payer: Medicare PPO | Admitting: Occupational Therapy

## 2021-09-10 ENCOUNTER — Encounter: Payer: Medicare PPO | Admitting: Occupational Therapy

## 2021-09-12 ENCOUNTER — Ambulatory Visit: Payer: Medicare PPO | Admitting: Occupational Therapy

## 2021-09-12 DIAGNOSIS — I890 Lymphedema, not elsewhere classified: Secondary | ICD-10-CM | POA: Diagnosis not present

## 2021-09-12 NOTE — Therapy (Signed)
[place] ?[place], [place], [address] ?Phone: [phone]   Fax:  [phone] ? ?Occupational Therapy Treatment ? ?Patient Details  ?Name: Nancy Riley ?MRN: [id number] ?Date of Birth: 10/17/1955 ?Referring Provider (OT): Eulogio Ditch, NP ? ? ?Encounter Date: 09/12/2021 ? ? OT End of Session - 09/12/21 1403   ? ? Visit Number 57   ? Number of Visits 72   ? Date for OT Re-Evaluation 09/05/21   ? OT Start Time 1100   ? OT Stop Time 1200   ? OT Time Calculation (min) 60 min   ? Activity Tolerance Patient tolerated treatment well;No increased pain;Patient limited by pain   ? Behavior During Therapy [facility] for tasks assessed/performed   ? ?  ?  ? ?  ? ? ?Past Medical History:  ?Diagnosis Date  ? Diabetes mellitus without complication ([place])   ? DJD (degenerative joint disease)   ? GERD (gastroesophageal reflux disease)   ? Hypercholesteremia   ? Hypertension   ? Morbid obesity with BMI of 50.0-59.9, adult ([place])   ? Neuropathy   ? Phlegmon   ? ventral epidural phlegmon identified on lumbar spine MRI at [facility] in Nov 2020  ? Plantar fasciitis   ? Sciatica   ? Spondylodiscitis   ? lumbar spine (Nov 2020 at [facility])  ? ? ?Past Surgical History:  ?Procedure Laterality Date  ? BREAST EXCISIONAL BIOPSY Left 1970  ? negative  ? COLONOSCOPY    ? ? ?There were no vitals filed for this visit. ? ? Subjective Assessment - 09/12/21 1404   ? ? Subjective  Ms Lonardo presents for OT Rx visit 47/72 to address BLE lymphedema. Pt arrives seated in a transport wc with new compression garments in place. Pt endorses leg pain but does not rate numerically.   ? Pertinent History Spondylodiscitis, BLE lymphedema, HTN, DM, neuropathy, DJD, arthritis, sciatica,  chronic back pain, morbid obesity, (BMI 50-59.9)   ? Limitations Difficulty walking, impaired transfers/functional mobility, chronic back DJD and knee  OA pain , chronic leg swelling decreased strength, impaired balance,  impaired sensation (neuropathy in feet), increased infection risk, increased fall risk,   ? Repetition Increases Symptoms   ? Special Tests + Stemmer bilaterally   ? Patient Stated Goals get swelling back down and replace worn compression stockings to limit LE progression   ? Currently in Pain? Yes   ? Pain Onset Other (comment)   chronic  ? ?  ?  ? ?  ? ? ? ? ? ? ? ? ? ? ? ? ? ? ? OT Treatments/Exercises (OP) - 09/12/21 1405   ? ?  ? ADLs  ? ADL Education Given Yes   ?  ? Manual Therapy  ? Manual Therapy Edema management;Compression Bandaging;Manual Lymphatic Drainage (MLD)   ? Edema Management fitted Pt with Flexitouch garment and instructed on donning and doffing.   ? ?  ?  ? ?  ? ? ? ? ? ? ? ? ? OT Education - 09/12/21 1411   ? ? Education Details Continued skilled Pt/caregiver education  And LE ADL training throughout visit for lymphedema self care/ home program, including compression wrapping, compression garment and device wear/care, lymphatic pumping ther ex, simple self-MLD, and skin care. Discussed progress towards all goals todate.   ? Person(s) Educated Patient   ? Methods Explanation;Demonstration   ? Comprehension Verbalized understanding;Need further instruction;Returned demonstration   ? ?  ?  ? ?  ? ? ? ? ? ?  OT Long Term Goals - 08/01/21 1100   ? ?  ? OT LONG TERM GOAL #1  ? Title Pt will demonstrate understanding of lymphedema precautions and signs/ symptoms of cellulitis by identifying 6 items for each using the printed Lymphedema Workbook for reference PRN (modified independence) to limit LE progression and infection risk.   ? Baseline minA   ? Time 4   ? Period Days   ? Status Achieved   ?  ? OT LONG TERM GOAL #2  ? Title Pt will require Max assist from caregiver who will be able to apply multilayer, gradient compression wraps using correct techniques after skilled training to reduce limb volume and limity LE progression.   ? Baseline dependent   ? Period Days   ? Status Achieved   ?  ? OT LONG  TERM GOAL #3  ? Title Pt will achieve 10% limb volume reductions  bilaterally from ankle to tibial tuberosity during Intensive Phase CDT to return limbs to typical size and shape ,. reduce infection risk and improve basic andf instrumental ALDs performance.   ? Baseline intake volume RLE A-D= 9886.0 ml. intake volume LLE A-D= 10309.9 ml.   Achieved for RLE w/ 20.63% reduction on visit 20, 05/16/21.  ? Time 12   ? Period Weeks   ? Status Achieved   R leg limb volume reduction to date measures 20.63%. L leg volume reductionh to date = 28.3%. Goal met bilaterally  ?  ? OT LONG TERM GOAL #4  ? Title Pt will consistently perform all LE self -care home protocols ( modified simple self MLD, skin care, therapeutic exercise, compression therapies) daily  with max CG assistance to to achieve max edema reduction, to enanble functional improvements,  including fitting preferred street shoes and standard sized clothing, and to achieve increased AROM and reduced falls risk.   ? Baseline Max A   ? Time 12   ? Period Weeks   ? Status Achieved   Pt unable to perform simple self-MLD due to limited AROM and pain. Basic sequential pump trial failed as significant LE symptoms persist. Trial of advanced Flexitouch device was recently completed and results of insurance benefits is pending.  ? Target Date 06/07/21   ?  ? OT LONG TERM GOAL #5  ? Title Pt will demonstrate an increase in functional score on the FOTO assessment (Funtional utcomes) of 5 points above intyake score to demonstrate improvement in functional mobility .   ? Baseline Intake FOTO= 32\100   ? Time 12   ? Period Weeks   ? Status Achieved   Intake FOTO= 32\100. 12/19 value= 40; 06/27/21 value= 35  ?  ? Long Term Additional Goals  ? Additional Long Term Goals Yes   ? ?  ?  ? ?  ? ? ? ? ? ? ? ? Plan - 09/12/21 1406   ? ? Clinical Impression Statement Pr recieved donated advanced Flexitouch device today. Insurance denied coverage, but Pt accepted new donated device from  former patient. Reviewed device operation with Pt, including program selection, device precautions for SOB and We fitted leg and trunkal garments in clinic today. Trunkal piece is good fit. Pt will try leg garment at home  when supine in her chair  and let me know if we need to modify it to make it larger. If so OT will fabricate extensions. Also printed out device manual. We are hopeful that this device will assist in decongesting very large bilateral thigh  lobules since bandaging is not an option. Cont as per POC.   ? OT Occupational Profile and History Comprehensive Assessment- Review of records and extensive additional review of physical, cognitive, psychosocial history related to current functional performance   ? Occupational performance deficits (Please refer to evaluation for details): ADL's;Work;IADL's;Rest and Sleep;Leisure;Social Participation;Other   ? Body Structure / Function / Physical Skills ADL;Skin integrity;Endurance;Balance;Mobility;Strength;Edema;Obesity;Pain;Gait;ROM;Decreased knowledge of precautions;Decreased knowledge of use of DME;IADL   ? Clinical Decision Making Multiple treatment options, significant modification of task necessary   ? Comorbidities Affecting Occupational Performance: Presence of comorbidities impacting occupational performance   ? Modification or Assistance to Complete Evaluation  Max significant modification of tasks or assist is necessary to complete   ? OT Frequency 2x / week   ? OT Duration 12 weeks   ? OT Treatment/Interventions Self-care/ADL training;Therapeutic exercise;Coping strategies training;Therapeutic activities;Manual lymph drainage;Energy conservation;Manual Therapy;DME and/or AE instruction;Compression bandaging;Patient/family education   ? Plan CDT to BLE, one leg at a time, RL:E first   ? OT Home Exercise Plan fit with BLE custom flat knit compression knee highs   ? Recommended Other Services Lymphedema management has a high burden of care, especially  for Patients who have difficulty, or who are unable to reach their distal legs and feet.  reach their feet and distal legs to aapply bandages, compression garments, to bathe , inspect skin and groom nails. I

## 2021-09-12 NOTE — Patient Instructions (Signed)

## 2021-09-17 ENCOUNTER — Encounter: Payer: Medicare PPO | Admitting: Occupational Therapy

## 2021-09-19 ENCOUNTER — Encounter: Payer: Medicare PPO | Admitting: Occupational Therapy

## 2021-09-24 ENCOUNTER — Encounter: Payer: Medicare PPO | Admitting: Occupational Therapy

## 2021-09-26 ENCOUNTER — Encounter: Payer: Medicare PPO | Admitting: Occupational Therapy

## 2021-10-01 ENCOUNTER — Encounter: Payer: Medicare PPO | Admitting: Occupational Therapy

## 2021-10-03 ENCOUNTER — Encounter: Payer: Medicare PPO | Admitting: Occupational Therapy

## 2021-10-08 ENCOUNTER — Encounter: Payer: Medicare PPO | Admitting: Occupational Therapy

## 2021-10-10 ENCOUNTER — Ambulatory Visit: Payer: Medicare PPO | Attending: Nurse Practitioner | Admitting: Occupational Therapy

## 2021-10-15 ENCOUNTER — Encounter: Payer: Medicare PPO | Admitting: Occupational Therapy

## 2021-10-17 ENCOUNTER — Encounter: Payer: Medicare PPO | Admitting: Occupational Therapy

## 2021-10-19 ENCOUNTER — Other Ambulatory Visit: Payer: Self-pay | Admitting: Family Medicine

## 2021-10-19 DIAGNOSIS — Z1231 Encounter for screening mammogram for malignant neoplasm of breast: Secondary | ICD-10-CM

## 2021-10-22 ENCOUNTER — Encounter: Payer: Medicare PPO | Admitting: Occupational Therapy

## 2021-10-24 ENCOUNTER — Encounter: Payer: Medicare PPO | Admitting: Occupational Therapy

## 2021-10-30 ENCOUNTER — Encounter: Payer: Medicare PPO | Admitting: Occupational Therapy

## 2021-10-31 ENCOUNTER — Encounter: Payer: Medicare PPO | Admitting: Occupational Therapy

## 2021-11-01 ENCOUNTER — Encounter: Payer: Medicare PPO | Admitting: Occupational Therapy

## 2021-11-05 ENCOUNTER — Encounter: Payer: Medicare PPO | Admitting: Occupational Therapy

## 2021-11-12 ENCOUNTER — Encounter: Payer: Medicare PPO | Admitting: Occupational Therapy

## 2021-11-21 ENCOUNTER — Encounter: Payer: Medicare PPO | Admitting: Occupational Therapy

## 2021-11-26 ENCOUNTER — Encounter: Payer: Medicare PPO | Admitting: Occupational Therapy

## 2021-11-26 ENCOUNTER — Ambulatory Visit
Admission: RE | Admit: 2021-11-26 | Discharge: 2021-11-26 | Disposition: A | Discharge status: Home / Self Care | Payer: Medicare PPO | Source: Ambulatory Visit | Attending: Family Medicine | Admitting: Family Medicine

## 2021-11-26 DIAGNOSIS — Z1231 Encounter for screening mammogram for malignant neoplasm of breast: Secondary | ICD-10-CM | POA: Insufficient documentation

## 2021-12-03 DIAGNOSIS — H2513 Age-related nuclear cataract, bilateral: Secondary | ICD-10-CM | POA: Insufficient documentation

## 2021-12-03 DIAGNOSIS — H18231 Secondary corneal edema, right eye: Secondary | ICD-10-CM | POA: Insufficient documentation

## 2021-12-05 ENCOUNTER — Encounter: Payer: Medicare PPO | Admitting: Occupational Therapy

## 2021-12-10 ENCOUNTER — Encounter: Payer: Medicare PPO | Admitting: Occupational Therapy

## 2021-12-17 ENCOUNTER — Encounter: Payer: Medicare PPO | Admitting: Occupational Therapy

## 2021-12-24 ENCOUNTER — Encounter: Payer: Medicare PPO | Admitting: Occupational Therapy

## 2021-12-31 ENCOUNTER — Encounter: Payer: Medicare PPO | Admitting: Occupational Therapy

## 2022-08-03 ENCOUNTER — Encounter: Payer: Self-pay | Admitting: Emergency Medicine

## 2022-08-03 ENCOUNTER — Ambulatory Visit
Admission: EM | Admit: 2022-08-03 | Discharge: 2022-08-03 | Disposition: A | Discharge status: Home / Self Care | Payer: Medicare PPO | Attending: Physician Assistant | Admitting: Physician Assistant

## 2022-08-03 DIAGNOSIS — J029 Acute pharyngitis, unspecified: Secondary | ICD-10-CM | POA: Diagnosis present

## 2022-08-03 DIAGNOSIS — J0300 Acute streptococcal tonsillitis, unspecified: Secondary | ICD-10-CM

## 2022-08-03 LAB — GROUP A STREP BY PCR: Group A Strep by PCR: DETECTED — AB

## 2022-08-03 MED ORDER — AMOXICILLIN 500 MG PO CAPS: 500.0000 mg | ORAL_CAPSULE | Freq: Two times a day (BID) | ORAL | 0 refills | Status: AC

## 2022-08-03 NOTE — ED Triage Notes (Signed)
Patient c/o sore throat and congestion for a week.  Patient denies fevers.

## 2022-08-03 NOTE — Discharge Instructions (Addendum)
-  Your strep test is positive.  I sent antibiotics to the pharmacy. - You may continue taking Tylenol and the Mucinex.  Increase rest and fluids. - You should feel considerably better in the next 2 to 3 days. - If at any point you have any uncontrollable fever, difficulty managing your saliva, go to ER.

## 2022-08-03 NOTE — ED Provider Notes (Signed)
[facility]    CSN: [id number] Arrival date & time: 08/03/22  0830      History   Chief Complaint Chief Complaint  Patient presents with   Sore Throat    HPI Nancy Riley is a 67 y.o. female presenting for approximately a week history of sore throat and painful swallowing as well as congestion and postnasal drainage.  Denies fever, cough, breathing difficulty, vomiting or diarrhea.  Denies any sick contacts or known exposure to strep, flu or COVID.  Has been taking over-the-counter Mucinex for her symptoms.  Patient says her throat pain is about 5 out of 10.  She says it seems to be getting worse instead of better.  No other complaints.  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 at [facility] in Nov 2020   Plantar fasciitis    Sciatica    Spondylodiscitis    lumbar spine (Nov 2020 at [facility])    Patient Active Problem List   Diagnosis Date Noted   PAD (peripheral artery disease) ([place]) 08/01/2021   Osteoarthritis 10/04/2019   Spondylodiscitis 04/26/2019   Lymphedema 04/26/2019   Morbid obesity ([place]) 04/26/2019   BMI 45.0-49.9, adult ([place]) 08/20/2018   Controlled type 2 diabetes mellitus without complication, without long-term current use of insulin ([place]) 11/26/2017   GERD (gastroesophageal reflux disease) 11/26/2017   Hyperlipidemia, mixed 11/26/2017   Hypertension, essential 11/26/2017   Chest pain at rest 11/26/2017    Past Surgical History:  Procedure Laterality Date   BREAST EXCISIONAL BIOPSY Left 1970   negative   COLONOSCOPY     EYE SURGERY      OB History     Gravida  2   Para  2   Term      Preterm      AB      Living         SAB       IAB      Ectopic      Multiple      Live Births           Obstetric Comments  1st Menstrual Cycle:  14 1st Pregnancy:  22           Home Medications    Prior to Admission medications   Medication Sig Start Date End Date Taking? Authorizing Provider  amoxicillin (AMOXIL) 500 MG capsule Take 1 capsule (500 mg total) by mouth 2 (two) times daily for 10 days. 08/03/22 08/13/22 Yes Danton Clap, PA-C  aspirin EC 81 MG tablet Take 81 mg by mouth daily.    [provider]  celecoxib (CELEBREX) 200 MG capsule Take 200 mg by mouth 2 (two) times daily with a meal. Patient not taking: Reported on 08/02/2021    [provider]  cyclobenzaprine (FLEXERIL) 5 MG tablet Take 5 mg by mouth 2 (two) times daily as needed for muscle spasms. Patient not taking: Reported on 08/02/2021    [provider]  diclofenac Sodium (VOLTAREN) 1 % GEL Apply topically 4 (four) times daily.    [provider]  gabapentin (NEURONTIN) 100 MG capsule Take 100 mg by mouth 3 (  three) times daily.    [provider]  hydrochlorothiazide (HYDRODIURIL) 25 MG tablet Take 25 mg by mouth daily.    [provider]  HYDROcodone-acetaminophen (NORCO) 5-325 MG tablet Take 1 tablet by mouth every 4 (four) hours as needed for moderate pain. Patient not taking: Reported on 08/02/2021 01/02/19   Duanne Guess, PA-C  ketorolac (TORADOL) 10 MG tablet Take 1 tablet (10 mg total) by mouth every 8 (eight) hours. 10/18/18   Menshew, Dannielle Karvonen, PA-C  lisinopril (PRINIVIL,ZESTRIL) 10 MG tablet Take 10 mg by mouth daily.    [provider]  loratadine (CLARITIN) 10 MG tablet Take 10 mg by mouth daily.    [provider]  lovastatin (MEVACOR) 20 MG tablet Take 20 mg by mouth every evening.    [provider]  metFORMIN (GLUCOPHAGE-XR) 500 MG 24 hr tablet Take 500 mg by mouth daily.    [provider]  naproxen sodium (ALEVE) 220 MG tablet Take  220 mg by mouth daily as needed (pain).    [provider]  omeprazole (PRILOSEC) 20 MG capsule Take 20 mg by mouth daily. 07/31/20   [provider]  potassium chloride (MICRO-K) 10 MEQ CR capsule Take 10 mEq by mouth daily.    [provider]  predniSONE (DELTASONE) 10 MG tablet Take 1 tablet (10 mg total) by mouth daily. 6,5,4,3,2,1 six day taper Patient not taking: Reported on 08/02/2021 01/02/19   Duanne Guess, PA-C  ranitidine (ZANTAC) 150 MG tablet Take 150 mg by mouth 2 (two) times daily. Patient not taking: Reported on 08/02/2021    [provider]  traMADol (ULTRAM) 50 MG tablet Take 1 tablet (50 mg total) by mouth 2 (two) times daily. Patient not taking: Reported on 08/02/2021 10/18/18   Menshew, Dannielle Karvonen, PA-C    Family History Family History  Problem Relation Age of Onset   Breast cancer Neg Hx    Colon cancer Neg Hx     Social History Social History   Tobacco Use   Smoking status: Never   Smokeless tobacco: Never  Vaping Use   Vaping Use: Never used  Substance Use Topics   Alcohol use: Not Currently   Drug use: Never     Allergies   Patient has no known allergies.   Review of Systems Review of Systems  Constitutional:  Negative for chills, diaphoresis, fatigue and fever.  HENT:  Positive for congestion, postnasal drip and sore throat. Negative for ear pain, rhinorrhea, sinus pressure and sinus pain.   Respiratory:  Negative for cough and shortness of breath.   Gastrointestinal:  Negative for abdominal pain, nausea and vomiting.  Musculoskeletal:  Negative for arthralgias and myalgias.  Skin:  Negative for rash.  Neurological:  Negative for weakness and headaches.  Hematological:  Negative for adenopathy.     Physical Exam Triage Vital Signs ED Triage Vitals  Enc Vitals Group     BP 08/03/22 0841 (!) 144/67     Pulse Rate 08/03/22 0841 92     Resp 08/03/22 0841 15     Temp 08/03/22 0841 98.1 F (36.7 C)      Temp Source 08/03/22 0841 Oral     SpO2 08/03/22 0841 94 %     Weight 08/03/22 0839 (!) 340 lb (154.2 kg)     Height 08/03/22 0839 '5\' 3"'$  (1.6 m)     Head Circumference --      Peak Flow --  Pain Score 08/03/22 0839 5     Pain Loc --      Pain Edu? --      Excl. in [place]? --    No data found.  Updated Vital Signs BP (!) 144/67 (BP Location: Left Arm)   Pulse 92   Temp 98.1 F (36.7 C) (Oral)   Resp 15   Ht '5\' 3"'$  (1.6 m)   Wt (!) 340 lb (154.2 kg)   SpO2 94%   BMI 60.23 kg/m      Physical Exam Vitals and nursing note reviewed.  Constitutional:      General: She is not in acute distress.    Appearance: Normal appearance. She is not ill-appearing or toxic-appearing.  HENT:     Head: Normocephalic and atraumatic.     Nose: Nose normal.     Mouth/Throat:     Mouth: Mucous membranes are moist.     Pharynx: Oropharynx is clear. Posterior oropharyngeal erythema present.     Tonsils: Tonsillar exudate (scant whitish exdates on left tonsil) present. 2+ on the right. 2+ on the left.  Eyes:     General: No scleral icterus.       Right eye: No discharge.        Left eye: No discharge.     Conjunctiva/sclera: Conjunctivae normal.  Cardiovascular:     Rate and Rhythm: Normal rate and regular rhythm.     Heart sounds: Normal heart sounds.  Pulmonary:     Effort: Pulmonary effort is normal. No respiratory distress.     Breath sounds: Normal breath sounds.  Musculoskeletal:     Cervical back: Neck supple.  Skin:    General: Skin is dry.  Neurological:     General: No focal deficit present.     Mental Status: She is alert. Mental status is at baseline.     Motor: No weakness.     Gait: Gait normal.  Psychiatric:        Mood and Affect: Mood normal.        Behavior: Behavior normal.        Thought Content: Thought content normal.      UC Treatments / Results  Labs (all labs ordered are listed, but only abnormal results are displayed) Labs Reviewed  GROUP A STREP BY  PCR - Abnormal; Notable for the following components:      Result Value   Group A Strep by PCR DETECTED (*)    All other components within normal limits    EKG   Radiology No results found.  Procedures Procedures (including critical care time)  Medications Ordered in UC Medications - No data to display  Initial Impression / Assessment and Plan / UC Course  I have reviewed the triage vital signs and the nursing notes.  Pertinent labs & imaging results that were available during my care of the patient were reviewed by me and considered in my medical decision making (see chart for details).   67 year old female presents for sore throat and congestion for the past 1 week.  On exam she has tonsillar enlargement and exudates as well as erythema posterior pharynx.  PCR strep positive.  Will treat with amoxicillin.  Supportive care advised.  Reviewed return and ER precautions.   Final Clinical Impressions(s) / UC Diagnoses   Final diagnoses:  Strep tonsillitis  Sore throat     Discharge Instructions      -Your strep test is positive.  I sent antibiotics to the pharmacy. -  You may continue taking Tylenol and the Mucinex.  Increase rest and fluids. - You should feel considerably better in the next 2 to 3 days. - If at any point you have any uncontrollable fever, difficulty managing your saliva, go to ER.     ED Prescriptions     Medication Sig Dispense Auth. Provider   amoxicillin (AMOXIL) 500 MG capsule Take 1 capsule (500 mg total) by mouth 2 (two) times daily for 10 days. 20 capsule Danton Clap, PA-C      PDMP not reviewed this encounter.   Danton Clap, PA-C 08/03/22 [phone]

## 2022-08-05 ENCOUNTER — Ambulatory Visit (INDEPENDENT_AMBULATORY_CARE_PROVIDER_SITE_OTHER): Payer: Medicare PPO | Admitting: Vascular Surgery

## 2022-12-25 ENCOUNTER — Other Ambulatory Visit: Payer: Self-pay | Admitting: Family Medicine

## 2022-12-25 DIAGNOSIS — E785 Hyperlipidemia, unspecified: Secondary | ICD-10-CM

## 2022-12-25 DIAGNOSIS — E119 Type 2 diabetes mellitus without complications: Secondary | ICD-10-CM

## 2022-12-26 LAB — COLOGUARD: COLOGUARD: POSITIVE — AB

## 2022-12-31 ENCOUNTER — Encounter: Payer: Self-pay | Admitting: Physician Assistant

## 2022-12-31 ENCOUNTER — Ambulatory Visit (INDEPENDENT_AMBULATORY_CARE_PROVIDER_SITE_OTHER): Payer: Medicare PPO | Admitting: Physician Assistant

## 2022-12-31 VITALS — BP 121/66 | HR 76 | Temp 98.0°F | Ht 63.0 in | Wt 345.0 lb

## 2022-12-31 DIAGNOSIS — R195 Other fecal abnormalities: Secondary | ICD-10-CM | POA: Diagnosis not present

## 2022-12-31 MED ORDER — PEG 3350-KCL-NA BICARB-NACL 420 G PO SOLR: 4000.0000 mL | Freq: Once | ORAL | 0 refills | Status: AC

## 2022-12-31 NOTE — Progress Notes (Signed)
Nancy Amy, PA-C [address]  [address]  [place], [place] [address]  Main: [phone]  Fax: [phone]   Gastroenterology Consultation  Referring Provider:     Leanna Sato, MD Primary Care Physician:  Nancy Sato, MD Primary Gastroenterologist:  Nancy Amy, PA-C / Dr. Wyline Mood   Reason for Consultation:     Positive Cologuard        HPI:   Nancy Riley is a 67 y.o. y/o female referred for consultation & management  by Nancy Sato, MD. patient is referred from [facility].  She had a recent positive Cologuard test.  Last colonoscopy 10/2013 at [facility] - Report is not available.  Pt. States she had NO polyps.  No previous EGD.  She denies any GI symptoms such as abdominal pain, diarrhea, constipation, rectal bleeding, or weight loss.  She has no family history of colon cancer.  She has morbid obesity with BMI 61.  Is in a wheelchair today.  She is requesting to go ahead and schedule a colonoscopy.  She believes she is able to do a colonoscopy prep.  Past Medical History:  Diagnosis Date   Diabetes mellitus without complication (HCC)    DJD (degenerative joint disease)    GERD (gastroesophageal reflux disease)    Hypercholesteremia    Hypertension    Lymphedema    Morbid obesity with BMI of 50.0-59.9, adult (HCC)    Neuropathy    PAD (peripheral artery disease) (HCC)    Phlegmon    ventral epidural phlegmon identified on lumbar spine MRI at [facility] in Nov 2020   Plantar fasciitis    Sciatica    Spondylodiscitis    lumbar spine (Nov 2020 at [facility])    Past Surgical History:  Procedure Laterality Date   BREAST EXCISIONAL BIOPSY Left 1970   negative   COLONOSCOPY  10/2013   EYE SURGERY      Prior to Admission medications   Medication Sig Start Date End Date Taking? Authorizing Provider  aspirin EC 81 MG tablet Take 81 mg by mouth daily.    [provider]  celecoxib (CELEBREX) 200 MG capsule Take 200 mg by mouth 2 (two)  times daily with a meal. Patient not taking: Reported on 08/02/2021    [provider]  cyclobenzaprine (FLEXERIL) 5 MG tablet Take 5 mg by mouth 2 (two) times daily as needed for muscle spasms. Patient not taking: Reported on 08/02/2021    [provider]  diclofenac Sodium (VOLTAREN) 1 % GEL Apply topically 4 (four) times daily.    [provider]  gabapentin (NEURONTIN) 100 MG capsule Take 100 mg by mouth 3 (three) times daily.    [provider]  hydrochlorothiazide (HYDRODIURIL) 25 MG tablet Take 25 mg by mouth daily.    [provider]  HYDROcodone-acetaminophen (NORCO) 5-325 MG tablet Take 1 tablet by mouth every 4 (four) hours as needed for moderate pain. Patient not taking: Reported on 08/02/2021 01/02/19   Evon Slack, PA-C  ketorolac (TORADOL) 10 MG tablet Take 1 tablet (10 mg total) by mouth every 8 (eight) hours. 10/18/18   Menshew, Charlesetta Ivory, PA-C  lisinopril (PRINIVIL,ZESTRIL) 10 MG tablet Take 10 mg by mouth daily.    [provider]  loratadine (CLARITIN) 10 MG tablet Take 10 mg by mouth daily.    [provider]  lovastatin (MEVACOR) 20 MG tablet Take 20 mg by mouth every evening.    [provider]  metFORMIN (GLUCOPHAGE-XR) 500 MG 24 hr tablet Take 500 mg by mouth daily.    [provider]  naproxen sodium (ALEVE) 220 MG tablet Take 220 mg by mouth daily as needed (pain).    [provider]  omeprazole (PRILOSEC) 20 MG capsule Take 20 mg by mouth daily. 07/31/20   [provider]  potassium chloride (MICRO-K) 10 MEQ CR capsule Take 10 mEq by mouth daily.    [provider]  predniSONE (DELTASONE) 10 MG tablet Take 1 tablet (10 mg total) by mouth daily. 6,5,4,3,2,1 six day taper Patient not taking: Reported on 08/02/2021 01/02/19   Evon Slack, PA-C  ranitidine (ZANTAC) 150 MG tablet Take 150 mg by mouth 2 (two) times daily. Patient not taking: Reported on 08/02/2021     [provider]  traMADol (ULTRAM) 50 MG tablet Take 1 tablet (50 mg total) by mouth 2 (two) times daily. Patient not taking: Reported on 08/02/2021 10/18/18   Menshew, Charlesetta Ivory, PA-C    Family History  Problem Relation Age of Onset   Breast cancer Neg Hx    Colon cancer Neg Hx      Social History   Tobacco Use   Smoking status: Never   Smokeless tobacco: Never  Vaping Use   Vaping status: Never Used  Substance Use Topics   Alcohol use: Not Currently   Drug use: Never    Allergies as of 12/31/2022   (No Known Allergies)    Review of Systems:    All systems reviewed and negative except where noted in HPI.   Physical Exam:  BP 121/66   Pulse 76   Temp 98 F (36.7 C)   Ht 5\' 3"  (1.6 m)   Wt (!) 345 lb (156.5 kg)   BMI 61.11 kg/m  No LMP recorded. Patient is postmenopausal. Psych:  Alert and cooperative. Normal mood and affect. General:   Alert,  Morbidly obese, Sitting in a Wheelchair, pleasant and cooperative in NAD.  Not able to get on exam table. Head:  Normocephalic and atraumatic. Lungs:  Respirations even and unlabored.  Clear throughout to auscultation.   No wheezes, crackles, or rhonchi. No acute distress. Heart:  Regular rate and rhythm; no murmurs, clicks, rubs, or gallops. Abdomen:  Normal bowel sounds.  No bruits.  Soft, and non-distended without masses, hepatosplenomegaly or hernias noted.  No Tenderness.  No guarding or rebound tenderness.    Neurologic:  Alert and oriented x3; has compression hose on her legs.  Wheelchair dependent.  Deconditioned.  Morbidly obese. Psych:  Alert and cooperative. Normal mood and affect.  Imaging Studies: No results found.  Assessment and Plan:   Nancy Riley is a 67 y.o. y/o female has been referred for:  1.  Positive screening Cologuard test  Scheduling Colonoscopy I discussed risks of colonoscopy with patient to include risk of bleeding, colon perforation, and risk of sedation.  Patient expressed  understanding and wishes to proceed with colonoscopy.   2.  Morbid Obesity: BMI 61  Schedule Colonoscopy at Hospital with Anesthesia support.  Follow up based on colonoscopy results and GI symptoms.  Nancy Amy, PA-C

## 2023-02-11 ENCOUNTER — Encounter: Payer: Self-pay | Admitting: Gastroenterology

## 2023-02-11 ENCOUNTER — Telehealth: Payer: Self-pay

## 2023-02-11 NOTE — Telephone Encounter (Signed)
Patientcalls office today to see if she can eat a banana -she is scheduled for Procedures tomorrow-she was told to follow liquid diet all day today and until after her procedures tomorrow- also wanted to know if she can take tylenol and ibuprofen today if needed and was told yes.

## 2023-02-12 ENCOUNTER — Ambulatory Visit
Admission: RE | Admit: 2023-02-12 | Discharge: 2023-02-12 | Disposition: A | Discharge status: Home / Self Care | Payer: Medicare PPO | Attending: Gastroenterology | Admitting: Gastroenterology

## 2023-02-12 ENCOUNTER — Ambulatory Visit: Payer: Medicare PPO

## 2023-02-12 ENCOUNTER — Encounter
Admission: RE | Disposition: A | Discharge status: Home / Self Care | Payer: Self-pay | Source: Home / Self Care | Attending: Gastroenterology

## 2023-02-12 DIAGNOSIS — Z7984 Long term (current) use of oral hypoglycemic drugs: Secondary | ICD-10-CM | POA: Diagnosis not present

## 2023-02-12 DIAGNOSIS — K219 Gastro-esophageal reflux disease without esophagitis: Secondary | ICD-10-CM | POA: Insufficient documentation

## 2023-02-12 DIAGNOSIS — K5730 Diverticulosis of large intestine without perforation or abscess without bleeding: Secondary | ICD-10-CM | POA: Diagnosis not present

## 2023-02-12 DIAGNOSIS — R195 Other fecal abnormalities: Secondary | ICD-10-CM | POA: Diagnosis not present

## 2023-02-12 DIAGNOSIS — E1140 Type 2 diabetes mellitus with diabetic neuropathy, unspecified: Secondary | ICD-10-CM | POA: Insufficient documentation

## 2023-02-12 DIAGNOSIS — K640 First degree hemorrhoids: Secondary | ICD-10-CM | POA: Diagnosis not present

## 2023-02-12 DIAGNOSIS — I10 Essential (primary) hypertension: Secondary | ICD-10-CM | POA: Insufficient documentation

## 2023-02-12 DIAGNOSIS — Z1211 Encounter for screening for malignant neoplasm of colon: Secondary | ICD-10-CM | POA: Diagnosis present

## 2023-02-12 HISTORY — PX: COLONOSCOPY WITH PROPOFOL: SHX5780

## 2023-02-12 LAB — GLUCOSE, CAPILLARY: Glucose-Capillary: 91 mg/dL (ref 70–99)

## 2023-02-12 SURGERY — COLONOSCOPY WITH PROPOFOL
Anesthesia: General

## 2023-02-12 MED ORDER — LIDOCAINE HCL (CARDIAC) PF 100 MG/5ML IV SOSY
PREFILLED_SYRINGE | INTRAVENOUS | Status: DC | PRN
  Administered 2023-02-12: 100 mg via INTRAVENOUS

## 2023-02-12 MED ORDER — LIDOCAINE HCL (PF) 2 % IJ SOLN
INTRAMUSCULAR | Status: AC
  Filled 2023-02-12: qty 5

## 2023-02-12 MED ORDER — DEXMEDETOMIDINE HCL IN NACL 80 MCG/20ML IV SOLN
INTRAVENOUS | Status: DC | PRN
Start: 2023-02-12 — End: 2023-02-12
  Administered 2023-02-12: 8 ug via INTRAVENOUS
  Administered 2023-02-12 (×3): 4 ug via INTRAVENOUS

## 2023-02-12 MED ORDER — SODIUM CHLORIDE 0.9 % IV SOLN
INTRAVENOUS | Status: DC
  Administered 2023-02-12: 20 mL/h via INTRAVENOUS

## 2023-02-12 MED ORDER — PROPOFOL 10 MG/ML IV BOLUS
INTRAVENOUS | Status: DC | PRN
  Administered 2023-02-12: 20 mg via INTRAVENOUS
  Administered 2023-02-12: 80 mg via INTRAVENOUS

## 2023-02-12 MED ORDER — PROPOFOL 1000 MG/100ML IV EMUL
INTRAVENOUS | Status: AC
  Filled 2023-02-12: qty 100

## 2023-02-12 MED ORDER — PROPOFOL 500 MG/50ML IV EMUL
INTRAVENOUS | Status: DC | PRN
  Administered 2023-02-12: 75 ug/kg/min via INTRAVENOUS

## 2023-02-12 NOTE — Anesthesia Postprocedure Evaluation (Signed)
Anesthesia Post Note  Patient: Nancy Riley  Procedure(s) Performed: COLONOSCOPY WITH PROPOFOL  Patient location during evaluation: Endoscopy Anesthesia Type: General Level of consciousness: awake and alert Pain management: pain level controlled Vital Signs Assessment: post-procedure vital signs reviewed and stable Respiratory status: spontaneous breathing, nonlabored ventilation, respiratory function stable and patient connected to nasal cannula oxygen Cardiovascular status: blood pressure returned to baseline and stable Postop Assessment: no apparent nausea or vomiting Anesthetic complications: no   No notable events documented.   Last Vitals:  Vitals:   02/12/23 1043 02/12/23 1046  BP: (!) 143/90 (!) 153/87  Pulse: 82 79  Resp:    Temp:    SpO2: 100% 100%    Last Pain:  Vitals:   02/12/23 1032  TempSrc: Temporal  PainSc:                  Louie Boston

## 2023-02-12 NOTE — Op Note (Signed)
[facility] Patient Name: Nancy Riley Procedure Date: 02/12/2023 10:06 AM MRN: [id number] Account #: [id number] Date of Birth: Nov 29, 1955 Admit Type: Outpatient Age: 67 Room: [facility] ENDO ROOM 3 Gender: Female Note Status: Finalized Instrument Name: Prentice Docker [id number] Procedure:             Colonoscopy Indications:           Screening for colorectal malignant neoplasm due to                         positive Cologuard test Providers:             Wyline Mood MD, MD Referring MD:          Wyline Mood MD, MD (Referring MD), Leanna Sato, MD                         (Referring MD) Medicines:             Monitored Anesthesia Care Complications:         No immediate complications. Procedure:             Pre-Anesthesia Assessment:                        - Prior to the procedure, a History and Physical was                         performed, and patient medications, allergies and                         sensitivities were reviewed. The patient's tolerance                         of previous anesthesia was reviewed.                        - The risks and benefits of the procedure and the                         sedation options and risks were discussed with the                         patient. All questions were answered and informed                         consent was obtained.                        - ASA Grade Assessment: II - A patient with mild                         systemic disease.                        After obtaining informed consent, the colonoscope was                         passed under direct vision. Throughout the procedure,                         the patient's blood  pressure, pulse, and oxygen                         saturations were monitored continuously. The                         Colonoscope was introduced through the anus and                         advanced to the the cecum, identified by the                         appendiceal  orifice. The colonoscopy was performed                         with ease. The patient tolerated the procedure well.                         The quality of the bowel preparation was excellent.                         The ileocecal valve, appendiceal orifice, and rectum                         were photographed. Findings:      The perianal and digital rectal examinations were normal.      Multiple medium-mouthed diverticula were found in the left colon.      Non-bleeding internal hemorrhoids were found during retroflexion. The       hemorrhoids were medium-sized and Grade I (internal hemorrhoids that do       not prolapse).      The exam was otherwise without abnormality on direct and retroflexion       views. Impression:            - Diverticulosis in the left colon.                        - Non-bleeding internal hemorrhoids.                        - The examination was otherwise normal on direct and                         retroflexion views.                        - No specimens collected. Recommendation:        - Discharge patient to home (with escort).                        - Resume previous diet.                        - Continue present medications. Procedure Code(s):     --- Professional ---                        [phone], Colonoscopy, flexible; diagnostic, including                         collection of specimen(s) by brushing  or washing, when                         performed (separate procedure) Diagnosis Code(s):     --- Professional ---                        Z12.11, Encounter for screening for malignant neoplasm                         of colon                        R19.5, Other fecal abnormalities                        K64.0, First degree hemorrhoids                        K57.30, Diverticulosis of large intestine without                         perforation or abscess without bleeding CPT copyright 2022 American Medical Association. All rights reserved. The codes  documented in this report are preliminary and upon coder review may  be revised to meet current compliance requirements. Wyline Mood, MD Wyline Mood MD, MD 02/12/2023 10:30:07 AM This report has been signed electronically. Number of Addenda: 0 Note Initiated On: 02/12/2023 10:06 AM Scope Withdrawal Time: 0 hours 7 minutes 45 seconds  Total Procedure Duration: 0 hours 11 minutes 14 seconds  Estimated Blood Loss:  Estimated blood loss: none.      [facility]

## 2023-02-12 NOTE — Anesthesia Procedure Notes (Signed)
Procedure Name: General with mask airway Date/Time: 02/12/2023 10:12 AM  Performed by: Lily Lovings, CRNAPre-anesthesia Checklist: Patient identified, Emergency Drugs available, Suction available and Patient being monitored Patient Re-evaluated:Patient Re-evaluated prior to induction Oxygen Delivery Method: Simple face mask Induction Type: IV induction Comments: POM mask

## 2023-02-12 NOTE — Anesthesia Preprocedure Evaluation (Signed)
Anesthesia Evaluation  Patient identified by MRN, date of birth, ID band Patient awake    Reviewed: Allergy & Precautions, NPO status , Patient's Chart, lab work & pertinent test results  History of Anesthesia Complications Negative for: history of anesthetic complications  Airway Mallampati: III  TM Distance: >3 FB Neck ROM: full    Dental  (+) Edentulous Upper, Edentulous Lower   Pulmonary neg pulmonary ROS   Pulmonary exam normal        Cardiovascular hypertension, On Medications + Peripheral Vascular Disease  Normal cardiovascular exam     Neuro/Psych  Neuromuscular disease  negative psych ROS   GI/Hepatic Neg liver ROS,GERD  Controlled,,  Endo/Other  diabetes, Type 2, Oral Hypoglycemic Agents  Morbid obesity  Renal/GU negative Renal ROS  negative genitourinary   Musculoskeletal   Abdominal   Peds  Hematology negative hematology ROS (+)   Anesthesia Other Findings Past Medical History: No date: Diabetes mellitus without complication (HCC) No date: DJD (degenerative joint disease) No date: GERD (gastroesophageal reflux disease) No date: Hypercholesteremia No date: Hypertension No date: Lymphedema No date: Morbid obesity with BMI of 50.0-59.9, adult (HCC) No date: Neuropathy No date: PAD (peripheral artery disease) (HCC) No date: Phlegmon     Comment:  ventral epidural phlegmon identified on lumbar spine MRI              at [facility] in Nov 2020 No date: Plantar fasciitis No date: Sciatica No date: Spondylodiscitis     Comment:  lumbar spine (Nov 2020 at [facility])  Past Surgical History: 1970: BREAST EXCISIONAL BIOPSY; Left     Comment:  negative 10/2013: COLONOSCOPY No date: EYE SURGERY  BMI    Body Mass Index: 61.65 kg/m      Reproductive/Obstetrics negative OB ROS                             Anesthesia Physical Anesthesia Plan  ASA: 3  Anesthesia Plan: General    Post-op Pain Management: Minimal or no pain anticipated   Induction: Intravenous  PONV Risk Score and Plan: 2 and Propofol infusion and TIVA  Airway Management Planned: Natural Airway and Nasal Cannula  Additional Equipment:   Intra-op Plan:   Post-operative Plan:   Informed Consent: I have reviewed the patients History and Physical, chart, labs and discussed the procedure including the risks, benefits and alternatives for the proposed anesthesia with the patient or authorized representative who has indicated his/her understanding and acceptance.     Dental Advisory Given  Plan Discussed with: Anesthesiologist, CRNA and Surgeon  Anesthesia Plan Comments: (Patient consented for risks of anesthesia including but not limited to:  - adverse reactions to medications - risk of airway placement if required - damage to eyes, teeth, lips or other oral mucosa - nerve damage due to positioning  - sore throat or hoarseness - Damage to heart, brain, nerves, lungs, other parts of body or loss of life  Patient voiced understanding.)       Anesthesia Quick Evaluation

## 2023-02-12 NOTE — Transfer of Care (Signed)
Immediate Anesthesia Transfer of Care Note  Patient: Nancy Riley  Procedure(s) Performed: COLONOSCOPY WITH PROPOFOL  Patient Location: Endoscopy Unit  Anesthesia Type:General  Level of Consciousness: awake and patient cooperative  Airway & Oxygen Therapy: Patient Spontanous Breathing and Patient connected to face mask oxygen  Post-op Assessment: Report given to RN and Post -op Vital signs reviewed and stable  Post vital signs: Reviewed and stable  Last Vitals:  Vitals Value Taken Time  BP 106/83 02/12/23 1033  Temp    Pulse 80 02/12/23 1033  Resp    SpO2 100 % 02/12/23 1033  Vitals shown include unfiled device data.  Last Pain:  Vitals:   02/12/23 0926  TempSrc: Temporal  PainSc: 10-Worst pain ever         Complications: No notable events documented.

## 2023-02-12 NOTE — H&P (Signed)
Wyline Mood, MD [address], [place], [place], [address] [address], [address], [place], [place], [address] Phone: [phone]  Fax: [phone]  Primary Care Physician:  Leanna Sato, MD   Pre-Procedure History & Physical: HPI:  Nancy Riley is a 67 y.o. female is here for an colonoscopy.   Past Medical History:  Diagnosis Date   Diabetes mellitus without complication (HCC)    DJD (degenerative joint disease)    GERD (gastroesophageal reflux disease)    Hypercholesteremia    Hypertension    Lymphedema    Morbid obesity with BMI of 50.0-59.9, adult (HCC)    Neuropathy    PAD (peripheral artery disease) (HCC)    Phlegmon    ventral epidural phlegmon identified on lumbar spine MRI at [facility] in Nov 2020   Plantar fasciitis    Sciatica    Spondylodiscitis    lumbar spine (Nov 2020 at [facility])    Past Surgical History:  Procedure Laterality Date   BREAST EXCISIONAL BIOPSY Left 1970   negative   COLONOSCOPY  10/2013   EYE SURGERY      Prior to Admission medications   Medication Sig Start Date End Date Taking? Authorizing Provider  cyclobenzaprine (FLEXERIL) 5 MG tablet Take 5 mg by mouth 2 (two) times daily as needed for muscle spasms.   Yes [provider]  diclofenac Sodium (VOLTAREN) 1 % GEL Apply topically 4 (four) times daily.   Yes [provider]  gabapentin (NEURONTIN) 100 MG capsule Take 100 mg by mouth 3 (three) times daily.   Yes [provider]  hydrochlorothiazide (HYDRODIURIL) 25 MG tablet Take 25 mg by mouth daily.   Yes [provider]  lisinopril (PRINIVIL,ZESTRIL) 10 MG tablet Take 10 mg by mouth daily.   Yes [provider]  loratadine (CLARITIN) 10 MG tablet Take 10 mg by mouth daily.   Yes [provider]  lovastatin (MEVACOR) 20 MG tablet Take 20 mg by mouth every evening.   Yes [provider]  metFORMIN (GLUCOPHAGE-XR) 500 MG 24 hr tablet Take 500 mg by mouth daily.    Yes [provider]  naproxen sodium (ALEVE) 220 MG tablet Take 220 mg by mouth daily as needed (pain).   Yes [provider]  omeprazole (PRILOSEC) 20 MG capsule Take 20 mg by mouth daily. 07/31/20  Yes [provider]  potassium chloride (MICRO-K) 10 MEQ CR capsule Take 10 mEq by mouth daily.   Yes [provider]  predniSONE (DELTASONE) 10 MG tablet Take 1 tablet (10 mg total) by mouth daily. 6,5,4,3,2,1 six day taper 01/02/19  Yes Evon Slack, PA-C    Allergies as of 12/31/2022   (No Known Allergies)    Family History  Problem Relation Age of Onset   Breast cancer Neg Hx    Colon cancer Neg Hx     Social History   Socioeconomic History   Marital status: Single    Spouse name: Not on file   Number of children: Not on file   Years of education: Not on file   Highest education level: Not on file  Occupational History   Not on file  Tobacco Use   Smoking status: Never   Smokeless tobacco: Never  Vaping Use   Vaping status: Never Used  Substance and Sexual Activity   Alcohol use: Not Currently   Drug use: Never   Sexual activity: Not on file  Other Topics Concern  Not on file  Social History Narrative   Not on file   Social Determinants of Health   Financial Resource Strain: Low Risk  (10/05/2019)   Received from [facility], [facility]   Overall Financial Resource Strain (CARDIA)    Difficulty of Paying Living Expenses: Not very hard  Food Insecurity: No Food Insecurity (10/05/2019)   Received from [facility], [facility]   Hunger Vital Sign    Worried About Running Out of Food in the Last Year: Never true    Ran Out of Food in the Last Year: Never true  Transportation Needs: No Transportation Needs (10/05/2019)   Received from [facility], [facility]   [facility] - Transportation    Lack of Transportation (Medical): No    Lack of Transportation (Non-Medical): No  Physical Activity: Not on file  Stress:  Not on file  Social Connections: Not on file  Intimate Partner Violence: Not on file    Review of Systems: See HPI, otherwise negative ROS  Physical Exam: There were no vitals taken for this visit. General:   Alert,  pleasant and cooperative in NAD Head:  Normocephalic and atraumatic. Neck:  Supple; no masses or thyromegaly. Lungs:  Clear throughout to auscultation, normal respiratory effort.    Heart:  +S1, +S2, Regular rate and rhythm, No edema. Abdomen:  Soft, nontender and nondistended. Normal bowel sounds, without guarding, and without rebound.   Neurologic:  Alert and  oriented x4;  grossly normal neurologically.  Impression/Plan: Nancy Riley is here for an colonoscopy to be performed for positive cologaurd Risks, benefits, limitations, and alternatives regarding  colonoscopy have been reviewed with the patient.  Questions have been answered.  All parties agreeable.   Wyline Mood, MD  02/12/2023, 9:29 AM

## 2023-02-13 ENCOUNTER — Encounter: Payer: Self-pay | Admitting: Gastroenterology

## 2023-12-09 ENCOUNTER — Other Ambulatory Visit: Payer: Self-pay | Admitting: Family Medicine

## 2023-12-09 DIAGNOSIS — Z1231 Encounter for screening mammogram for malignant neoplasm of breast: Secondary | ICD-10-CM

## 2023-12-16 ENCOUNTER — Encounter

## 2023-12-24 ENCOUNTER — Emergency Department
Admission: EM | Admit: 2023-12-24 | Discharge: 2023-12-24 | Disposition: A | Discharge status: Home / Self Care | Source: Ambulatory Visit | Attending: Emergency Medicine | Admitting: Emergency Medicine

## 2023-12-24 ENCOUNTER — Encounter: Payer: Self-pay | Admitting: Emergency Medicine

## 2023-12-24 ENCOUNTER — Other Ambulatory Visit: Payer: Self-pay

## 2023-12-24 ENCOUNTER — Ambulatory Visit: Admission: EM | Admit: 2023-12-24 | Discharge: 2023-12-24 | Disposition: A | Discharge status: Home / Self Care

## 2023-12-24 ENCOUNTER — Emergency Department

## 2023-12-24 DIAGNOSIS — I10 Essential (primary) hypertension: Secondary | ICD-10-CM | POA: Insufficient documentation

## 2023-12-24 DIAGNOSIS — E119 Type 2 diabetes mellitus without complications: Secondary | ICD-10-CM | POA: Insufficient documentation

## 2023-12-24 DIAGNOSIS — Z8739 Personal history of other diseases of the musculoskeletal system and connective tissue: Secondary | ICD-10-CM

## 2023-12-24 DIAGNOSIS — M4807 Spinal stenosis, lumbosacral region: Secondary | ICD-10-CM | POA: Diagnosis not present

## 2023-12-24 DIAGNOSIS — M545 Low back pain, unspecified: Secondary | ICD-10-CM

## 2023-12-24 LAB — CBC WITH DIFFERENTIAL/PLATELET
Abs Immature Granulocytes: 0.03 K/uL (ref 0.00–0.07)
Basophils Absolute: 0 K/uL (ref 0.0–0.1)
Basophils Relative: 1 %
Eosinophils Absolute: 0.2 K/uL (ref 0.0–0.5)
Eosinophils Relative: 3 %
HCT: 35.4 % — ABNORMAL LOW (ref 36.0–46.0)
Hemoglobin: 10.6 g/dL — ABNORMAL LOW (ref 12.0–15.0)
Immature Granulocytes: 1 %
Lymphocytes Relative: 30 %
Lymphs Abs: 1.6 K/uL (ref 0.7–4.0)
MCH: 26.3 pg (ref 26.0–34.0)
MCHC: 29.9 g/dL — ABNORMAL LOW (ref 30.0–36.0)
MCV: 87.8 fL (ref 80.0–100.0)
Monocytes Absolute: 0.5 K/uL (ref 0.1–1.0)
Monocytes Relative: 9 %
Neutro Abs: 3.1 K/uL (ref 1.7–7.7)
Neutrophils Relative %: 56 %
Platelets: 305 K/uL (ref 150–400)
RBC: 4.03 MIL/uL (ref 3.87–5.11)
RDW: 13.2 % (ref 11.5–15.5)
WBC: 5.5 K/uL (ref 4.0–10.5)
nRBC: 0 % (ref 0.0–0.2)

## 2023-12-24 LAB — BASIC METABOLIC PANEL WITH GFR
Anion gap: 11 (ref 5–15)
BUN: 18 mg/dL (ref 8–23)
CO2: 27 mmol/L (ref 22–32)
Calcium: 9.4 mg/dL (ref 8.9–10.3)
Chloride: 101 mmol/L (ref 98–111)
Creatinine, Ser: 0.96 mg/dL (ref 0.44–1.00)
GFR, Estimated: >60 mL/min (ref >60)
Glucose, Bld: 128 mg/dL — ABNORMAL HIGH (ref 70–99)
Potassium: 4 mmol/L (ref 3.5–5.1)
Sodium: 139 mmol/L (ref 135–145)

## 2023-12-24 MED ORDER — DICLOFENAC SODIUM 75 MG PO TBEC: 75.0000 mg | DELAYED_RELEASE_TABLET | Freq: Two times a day (BID) | ORAL | 0 refills | Status: AC

## 2023-12-24 MED ORDER — DEXAMETHASONE SODIUM PHOSPHATE 10 MG/ML IJ SOLN
10.0000 mg | Freq: Once | INTRAMUSCULAR | Status: AC
  Administered 2023-12-24: 10 mg via INTRAVENOUS
  Filled 2023-12-24: qty 1

## 2023-12-24 MED ORDER — GADOBUTROL 1 MMOL/ML IV SOLN
10.0000 mL | Freq: Once | INTRAVENOUS | Status: AC | PRN
  Administered 2023-12-24: 10 mL via INTRAVENOUS

## 2023-12-24 MED ORDER — PREDNISONE 20 MG PO TABS
40.0000 mg | ORAL_TABLET | Freq: Every day | ORAL | 0 refills | Status: AC
Start: 2023-12-24 — End: 2023-12-29

## 2023-12-24 NOTE — ED Provider Notes (Signed)
 [facility]    CSN: [id number] Arrival date & time: 12/24/23  1803      History   Chief Complaint Chief Complaint  Patient presents with   Back Pain   Tailbone Pain    HPI Nancy Riley is a 68 y.o. female presenting with a family member for 2-week history of low back pain.  Patient denies injury.  She saw PCP on the 10th and was prescribed tramadol  but says she cannot take it because it makes her legs swell.  No relief with OTC meds.  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  She reports 10 out of 10 back pain.  Pain radiates somewhat into her hips.  No numbness, weakness or tingling and no loss of bowel or bladder control.  No fever.  No other complaints.  HPI  Past Medical History:  Diagnosis Date   Diabetes mellitus without complication (HCC)    DJD (degenerative joint disease)    GERD (gastroesophageal reflux disease)    Hypercholesteremia    Hypertension    Lymphedema    Morbid obesity with BMI of 50.0-59.9, adult (HCC)    Neuropathy    PAD (peripheral artery disease) (HCC)    Phlegmon    ventral epidural phlegmon identified on lumbar spine MRI at [facility] in Nov 2020   Plantar fasciitis    Sciatica    Spondylodiscitis    lumbar spine (Nov 2020 at [facility])    Patient Active Problem List   Diagnosis Date Noted   Positive colorectal cancer screening using Cologuard test 02/12/2023   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/03/2021   Senile nuclear sclerosis, bilateral 12/03/2021   PAD (peripheral artery disease) (HCC) 08/01/2021   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   Osteoarthritis 10/04/2019   Acute osteomyelitis of thoracic spine (HCC) 10/04/2019   Spondylodiscitis 04/26/2019   Lymphedema 04/26/2019   Morbid obesity (HCC) 04/26/2019   BMI 45.0-49.9, adult (HCC) 08/20/2018   Controlled type 2 diabetes mellitus without complication, without long-term current use of insulin (HCC) 11/26/2017   GERD (gastroesophageal reflux  disease) 11/26/2017   Hyperlipidemia, mixed 11/26/2017   Hypertension, essential 11/26/2017   Chest pain at rest 11/26/2017    Past Surgical History:  Procedure Laterality Date   BREAST EXCISIONAL BIOPSY Left 1970   negative   COLONOSCOPY  10/2013   COLONOSCOPY WITH PROPOFOL  N/A 02/12/2023   Procedure: COLONOSCOPY WITH PROPOFOL ;  Surgeon: Therisa Bi, MD;  Location: [facility] ENDOSCOPY;  Service: Gastroenterology;  Laterality: N/A;   EYE SURGERY      OB History     Gravida  2   Para  2   Term      Preterm      AB      Living         SAB      IAB      Ectopic      Multiple      Live Births           Obstetric Comments  1st Menstrual Cycle:  14 1st Pregnancy:  22           Home Medications    Prior to Admission medications   Medication Sig Start Date End Date Taking? Authorizing Provider  prednisoLONE acetate (PRED FORTE) 1 % ophthalmic suspension Apply 1 drop to eye. 09/06/22  Yes [provider]  traMADol  (ULTRAM ) 50 MG tablet  12/11/23  Yes [provider]  TRUE METRIX BLOOD GLUCOSE  TEST test strip SMARTSIG:Via Meter 12/19/23  Yes [provider]  TRUEplus Lancets 33G MISC SMARTSIG:Lancet Topical 12/19/23  Yes [provider]  cyclobenzaprine  (FLEXERIL ) 5 MG tablet Take 5 mg by mouth 2 (two) times daily as needed for muscle spasms.    [provider]  diclofenac  Sodium (VOLTAREN ) 1 % GEL Apply topically 4 (four) times daily.    [provider]  FEROSUL 325 (65 Fe) MG tablet Take by mouth.    [provider]  gabapentin  (NEURONTIN ) 100 MG capsule Take 100 mg by mouth 3 (three) times daily.    [provider]  hydrochlorothiazide  (HYDRODIURIL ) 25 MG tablet Take 25 mg by mouth daily.    [provider]  ibuprofen (ADVIL) 800 MG tablet Take 800 mg by mouth every 8 (eight) hours as needed.    [provider]  lisinopril  (PRINIVIL ,ZESTRIL ) 10 MG tablet Take 10 mg by mouth  daily.    [provider]  loratadine  (CLARITIN ) 10 MG tablet Take 10 mg by mouth daily.    [provider]  lovastatin (MEVACOR) 20 MG tablet Take 20 mg by mouth every evening.    [provider]  magnesium  oxide (MAG-OX) 400 MG tablet Take 1 tablet by mouth 2 (two) times daily.    [provider]  metFORMIN (GLUCOPHAGE-XR) 500 MG 24 hr tablet Take 500 mg by mouth daily.    [provider]  naproxen sodium (ALEVE) 220 MG tablet Take 220 mg by mouth daily as needed (pain).    [provider]  omeprazole (PRILOSEC) 20 MG capsule Take 20 mg by mouth daily. 07/31/20   [provider]  potassium chloride  (KLOR-CON  M) 10 MEQ tablet Take 10 mEq by mouth daily.    [provider]  potassium chloride  (MICRO-K ) 10 MEQ CR capsule Take 10 mEq by mouth daily.    [provider]  predniSONE  (DELTASONE ) 10 MG tablet Take 1 tablet (10 mg total) by mouth daily. 6,5,4,3,2,1 six day taper 01/02/19   Charlene Debby BROCKS, PA-C  rosuvastatin (CRESTOR) 20 MG tablet Take 20 mg by mouth at bedtime.    [provider]    Family History Family History  Problem Relation Age of Onset   Breast cancer Neg Hx    Colon cancer Neg Hx     Social History Social History   Tobacco Use   Smoking status: Never    Passive exposure: Current   Smokeless tobacco: Never  Vaping Use   Vaping status: Never Used  Substance Use Topics   Alcohol use: Not Currently   Drug use: Never     Allergies   Patient has no known allergies.   Review of Systems Review of Systems  Constitutional:  Negative for fatigue and fever.  Musculoskeletal:  Positive for arthralgias, back pain and gait problem. Negative for joint swelling.  Neurological:  Negative for weakness and numbness.     Physical Exam Triage Vital Signs ED Triage Vitals  Encounter Vitals Group     BP 12/24/23 1817 (!) 144/55     Girls Systolic BP Percentile --      Girls Diastolic  BP Percentile --      Boys Systolic BP Percentile --      Boys Diastolic BP Percentile --      Pulse Rate 12/24/23 1817 79     Resp 12/24/23 1817 20     Temp 12/24/23 1817 98 F (36.7 C)     Temp Source 12/24/23  1817 Oral     SpO2 12/24/23 1817 98 %     Weight 12/24/23 1816 (!) 340 lb (154.2 kg)     Height --      Head Circumference --      Peak Flow --      Pain Score 12/24/23 1815 10     Pain Loc --      Pain Education --      Exclude from Growth Chart --    No data found.  Updated Vital Signs BP (!) 144/55 (BP Location: Left Arm)   Pulse 79   Temp 98 F (36.7 C) (Oral)   Resp 20   Wt (!) 340 lb (154.2 kg)   SpO2 98%   BMI 60.23 kg/m       Physical Exam Vitals and nursing note reviewed.  Constitutional:      General: She is not in acute distress.    Appearance: Normal appearance. She is obese. She is not ill-appearing or toxic-appearing.  HENT:     Head: Normocephalic and atraumatic.  Eyes:     General: No scleral icterus.       Right eye: No discharge.        Left eye: No discharge.     Conjunctiva/sclera: Conjunctivae normal.  Cardiovascular:     Rate and Rhythm: Normal rate and regular rhythm.     Heart sounds: Normal heart sounds.  Pulmonary:     Effort: Pulmonary effort is normal. No respiratory distress.     Breath sounds: Normal breath sounds.  Musculoskeletal:     Cervical back: Neck supple.     Comments: BACK: Diffuse TTP lumbar back especially over L4-L5-S1  Skin:    General: Skin is dry.  Neurological:     General: No focal deficit present.     Mental Status: She is alert. Mental status is at baseline.     Motor: No weakness.     Gait: Gait abnormal (wheelchair bound).  Psychiatric:        Mood and Affect: Mood normal.        Behavior: Behavior normal.      UC Treatments / Results  Labs (all labs ordered are listed, but only abnormal results are displayed) Labs Reviewed - No data to display  EKG   Radiology No results  found.  Procedures Procedures (including critical care time)  Medications Ordered in UC Medications - No data to display  Initial Impression / Assessment and Plan / UC Course  I have reviewed the triage vital signs and the nursing notes.  Pertinent labs & imaging results that were available during my care of the patient were reviewed by me and considered in my medical decision making (see chart for details).    68 year old female presenting for severe 10/10 lower back pain with radiation to both hips x 2 weeks.  Symptoms worsening and not improving.  History of discitis osteomyelitis of spine.  Tried tramadol  given by PCP on 7/10.  Patient advised ED evaluation given history of discitis and osteomyelitis of spine with complaints of 10 out of 10 pain at this time.  Patient will be in stable addition.  Family taking him to the emergency department. *** Final Clinical Impressions(s) / UC Diagnoses   Final diagnoses:  Acute bilateral low back pain, unspecified whether sciatica present  History of discitis     Discharge Instructions      - I have advised going to the emergency department for further evaluation of  your back pain given your complicated history of discitis and osteomyelitis.  You need further advanced imaging that we do not have access to the urgent care this evening.  You have been advised to follow up immediately in the emergency department for concerning signs.symptoms. If you declined EMS transport, please have a family member take you directly to the ED at this time. Do not delay. Based on concerns about condition, if you do not follow up in th e ED, you may risk poor outcomes including worsening of condition, delayed treatment and potentially life threatening issues. If you have declined to go to the ED at this time, you should call your PCP immediately to set up a follow up appointment.  Go to ED for red flag symptoms, including; fevers you cannot reduce with  Tylenol /Motrin, severe headaches, vision changes, numbness/weakness in part of the body, lethargy, confusion, intractable vomiting, severe dehydration, chest pain, breathing difficulty, severe persistent abdominal or pelvic pain, signs of severe infection (increased redness, swelling of an area), feeling faint or passing out, dizziness, etc. You should especially go to the ED for sudden acute worsening of condition if you do not elect to go at this time.    ED Prescriptions   None    PDMP not reviewed this encounter.

## 2023-12-24 NOTE — ED Triage Notes (Signed)
 Pt c/o lower back pain x2 weeks. Pt denies injury or fall. Pt reports hx of same pain.

## 2023-12-24 NOTE — ED Notes (Signed)
 Patient is being discharged from the Urgent Care and sent to the Emergency Department via personal vehicle . Per Arvis Jolan NOVAK, PA-C , patient is in need of higher level of care due to complicated history of discitis and osteomyelitis. Patient is aware and verbalizes understanding of plan of care.  Vitals:   12/24/23 1817  BP: (!) 144/55  Pulse: 79  Resp: 20  Temp: 98 F (36.7 C)  SpO2: 98%

## 2023-12-24 NOTE — Discharge Instructions (Addendum)
 Your exam, labs, and MRIs are normal and reassuring at this time.  Overall they do not show any evidence of a spinal cord abscess.  You do have evidence of progressive degenerative disc disease and spinal stenosis but is likely is the source of your acute on chronic low back pain.  Take prescription medicines as directed.  Avoid any OTC anti-inflammatories (ibuprofen, Motrin, Aleve, naproxen) while dosing the steroid.  You will then start the twice daily diclofenac  as your regular anti-inflammatory.  Increase your gabapentin  by 1 pill weekly as discussed.  Continue with your prescription muscle relaxant 1 to 2 pills up to 3 times a day as needed.  Follow-up with primary provider for ongoing evaluation as discussed.  Return to the ED if needed.

## 2023-12-24 NOTE — ED Triage Notes (Signed)
 Pt presents c/o back and tailbone pain x 1 week. Pt denies any injuries or falls.

## 2023-12-24 NOTE — ED Provider Notes (Signed)
 [facility] Emergency Department Provider Note     Event Date/Time   First MD Initiated Contact with Patient 12/24/23 2030     (approximate)   History   Back Pain   HPI  Nancy Riley is a 68 y.o. female with a history of DM type II, HTN, morbid obesity, GERD, sciatica, lower spondylodiscitis, presenting with a family member for 2-week history of acute on chronic low back pain. Patient denies injury. She saw PCP on the 10th and was prescribed tramadol  but says she cannot take it because it makes her legs swell. No relief with OTC meds. 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 She reports 10 out of 10 back pain. Pain radiates somewhat into her hips. No numbness, weakness or tingling and no loss of bowel or bladder control. No fever, chills, or sweats. No other complaints.  Patient reports she was never advised on the reason she developed a spontaneous abscess to her spine without previous procedure or instrumentation to the spine.   Physical Exam   Triage Vital Signs: ED Triage Vitals [12/24/23 1918]  Encounter Vitals Group     BP (!) 168/77     Girls Systolic BP Percentile      Girls Diastolic BP Percentile      Boys Systolic BP Percentile      Boys Diastolic BP Percentile      Pulse Rate 79     Resp 20     Temp 97.7 F (36.5 C)     Temp Source Oral     SpO2 100 %     Weight (!) 340 lb (154.2 kg)     Height 5' 3 (1.6 m)     Head Circumference      Peak Flow      Pain Score 10     Pain Loc      Pain Education      Exclude from Growth Chart     Most recent vital signs: Vitals:   12/24/23 1918 12/24/23 2258  BP: (!) 168/77 (!) 151/57  Pulse: 79 75  Resp: 20 20  Temp: 97.7 F (36.5 C) 97.7 F (36.5 C)  SpO2: 100% 100%    General Awake, no distress. NAD HEENT NCAT. PERRL. EOMI. No rhinorrhea. Mucous membranes are moist. CV:  Good peripheral perfusion. RRR RESP:  Normal effort. CTA ABD:  No distention.   MSK:  Active range of motion of all extremities. NEURO: Cranial nerves II to XII grossly intact.   ED Results / Procedures / Treatments   Labs (all labs ordered are listed, but only abnormal results are displayed) Labs Reviewed  CBC WITH DIFFERENTIAL/PLATELET - Abnormal; Notable for the following components:      Result Value   Hemoglobin 10.6 (*)    HCT 35.4 (*)    MCHC 29.9 (*)    All other components within normal limits  BASIC METABOLIC PANEL WITH GFR - Abnormal; Notable for the following components:   Glucose, Bld 128 (*)    All other components within normal limits     EKG   RADIOLOGY  I personally viewed and evaluated these images as part of my medical decision making, as well as reviewing the written report by the radiologist.  ED Provider Interpretation: DDD, spinal stenosis, foraminal stenosis  MR Lumbar Spine W Wo Contrast Result Date: 12/24/2023 CLINICAL DATA:  Lumbar radiculopathy, concern for infection. Back pain and tailbone pain for 1-2 weeks  without reported history of injury or fall. EXAM: MRI LUMBAR SPINE WITHOUT AND WITH CONTRAST TECHNIQUE: Multiplanar and multiecho pulse sequences of the lumbar spine were obtained without and with intravenous contrast. CONTRAST:  10mL GADAVIST  GADOBUTROL  1 MMOL/ML IV SOLN COMPARISON:  MRI thoracic and lumbar spine 09/27/2019. FINDINGS: Segmentation: There are presumed to be 5 non-rib-bearing lumbar type vertebral bodies. The lowest well-formed disc space is labeled L5-S1. Alignment: Lumbar lordosis is maintained. Grade 1 anterolisthesis of L4 on L5 is similar to prior. Vertebrae: Degenerative endplate changes at multiple levels. There is edema at the L2-3 and L5-S1 endplates with mild associated enhancement favored to reflect Modic type 1 degenerative endplate changes. These endplate changes are new since 2021. There is no evidence of acute fracture. There is no evidence of significant signal abnormality or enhancement within the  discs. Interval progression of disc height loss at L2-3 with possible partial fusion of the vertebral bodies along the left aspect. No suspicious osseous lesion. Conus medullaris and cauda equina: Conus extends to the L1-2 level. Conus and cauda equina appear normal. Paraspinal and other soft tissues: The paraspinal soft tissues are unremarkable. Disc levels: T12-L1: Minimal disc bulge. Mild facet arthrosis. No significant spinal canal or foraminal stenosis. L1-2: Minimal disc bulge. Mild facet arthrosis. No significant spinal canal or foraminal stenosis. L2-3: Interval disc height loss as above. Small disc bulge. Bilateral facet arthrosis and thickening of the ligamentum flavum which indents the dorsal thecal sac. Mild spinal canal stenosis. There is mild right and moderate left foraminal stenosis. L3-4: Disc desiccation and mild disc height loss. Diffuse disc bulge indents the ventral thecal sac with lateral recess narrowing. Severe bilateral facet arthrosis and thickening of the ligamentum flavum indents the dorsal thecal sac. Severe spinal canal stenosis which appears slightly increased from prior. There is severe right and moderate left foraminal stenosis which is slightly increased from prior. Small facet effusion on the left. L4-5: Grade 1 anterolisthesis with partial uncovering of the disc. Mild disc height loss. Diffuse disc bulge indents the ventral thecal sac with lateral recess narrowing. Severe facet arthrosis and thickening of the ligamentum flavum indents the dorsal thecal sac. Moderate spinal canal stenosis is similar to prior. There is mild right and moderate left foraminal stenosis similar to prior. L5-S1: Moderate disc height loss. Diffuse disc bulge indents the ventral thecal sac with lateral recess narrowing. Mild-to-moderate facet arthrosis. Thickening of the ligamentum flavum. Mild spinal canal stenosis. Mild epidural lipomatosis. There is mild bilateral foraminal stenosis. IMPRESSION:  Degenerative changes as above. Severe spinal canal stenosis at L3-4 is slightly increased since the prior MRI. Additional moderate spinal canal stenosis at L4-5 is similar to prior. Multilevel foraminal stenosis, greatest and severe on the right at L3-4 which appears increased from prior. Additional moderate foraminal stenosis at multiple levels. Endplate signal abnormality and mild enhancement at L3-4 and L5-S1 favored to reflect Modic type 1 degenerative endplate changes. Infection is considered less likely. Recommend clinical correlation. Facet arthrosis throughout the lumbar spine. Small facet effusion on the left at L3-4. Electronically Signed   By: Donnice Mania M.D.   On: 12/24/2023 21:33     PROCEDURES:  Critical Care performed: No  Procedures   MEDICATIONS ORDERED IN ED: Medications  gadobutrol  (GADAVIST ) 1 MMOL/ML injection 10 mL (10 mLs Intravenous Contrast Given 12/24/23 2112)  dexamethasone  (DECADRON ) injection 10 mg (10 mg Intravenous Given 12/24/23 2254)     IMPRESSION / MDM / ASSESSMENT AND PLAN / ED COURSE  I reviewed the triage  vital signs and the nursing notes.                              Differential diagnosis includes, but is not limited to, spinal abscess, osteomyelitis, DDD, spinal stenosis, foraminal stenosis, sciatica, radiculopathy  Patient's presentation is most consistent with acute complicated illness / injury requiring diagnostic workup.  Patient's diagnosis is consistent with acute on chronic low back pain.  Patient with reassuring exam and workup at this time.  MRI imaging interpreted by me, shows some progression of some spinal stenosis of the lower lumbar segment.  The changes likely represent the patient's acute on chronic exacerbation.  No red flags on exam at this time.  Patient's labs overall reassuring without evidence of leukocytosis or critical anemia.  We discussed management of her symptoms including changes to her medication regimen currently.  I  advised patient she could consider consider increasing her gabapentin  dosing.  Patient declined any narcotic pain medicines at this time.  She will continue with previously prescribed Flexeril  and a trial of a short course of a steroid before transitioning to twice a day diclofenac .  Patient will be discharged home with instructions to dose meds as prescribed, and hold off on any additional OTC anti-inflammatories.  Patient is to follow up with her primary provider as suggested, as needed or otherwise directed. Patient is given ED precautions to return to the ED for any worsening or new symptoms.  FINAL CLINICAL IMPRESSION(S) / ED DIAGNOSES   Final diagnoses:  Spinal stenosis of lumbosacral region  Chronic midline low back pain without sciatica     Rx / DC Orders   ED Discharge Orders          Ordered    diclofenac  (VOLTAREN ) 75 MG EC tablet  2 times daily        12/24/23 2246    predniSONE  (DELTASONE ) 20 MG tablet  Daily with breakfast        12/24/23 2246             Note:  This document was prepared using Dragon voice recognition software and may include unintentional dictation errors.    Loyd Candida LULLA Aldona, PA-C 12/24/23 2339    Levander Slate, MD 12/24/23 [phone]

## 2023-12-24 NOTE — Discharge Instructions (Signed)
-   I have advised going to the emergency department for further evaluation of your back pain given your complicated history of discitis and osteomyelitis.  You need further advanced imaging that we do not have access to the urgent care this evening.  You have been advised to follow up immediately in the emergency department for concerning signs.symptoms. If you declined EMS transport, please have a family member take you directly to the ED at this time. Do not delay. Based on concerns about condition, if you do not follow up in th e ED, you may risk poor outcomes including worsening of condition, delayed treatment and potentially life threatening issues. If you have declined to go to the ED at this time, you should call your PCP immediately to set up a follow up appointment.  Go to ED for red flag symptoms, including; fevers you cannot reduce with Tylenol /Motrin, severe headaches, vision changes, numbness/weakness in part of the body, lethargy, confusion, intractable vomiting, severe dehydration, chest pain, breathing difficulty, severe persistent abdominal or pelvic pain, signs of severe infection (increased redness, swelling of an area), feeling faint or passing out, dizziness, etc. You should especially go to the ED for sudden acute worsening of condition if you do not elect to go at this time.

## 2024-02-04 ENCOUNTER — Ambulatory Visit
Admission: RE | Admit: 2024-02-04 | Discharge: 2024-02-04 | Disposition: A | Discharge status: Home / Self Care | Source: Ambulatory Visit | Attending: Family Medicine | Admitting: Family Medicine

## 2024-02-04 DIAGNOSIS — Z1231 Encounter for screening mammogram for malignant neoplasm of breast: Secondary | ICD-10-CM | POA: Insufficient documentation

## 2024-04-24 ENCOUNTER — Ambulatory Visit: Admission: EM | Admit: 2024-04-24 | Discharge: 2024-04-24 | Disposition: A | Discharge status: Home / Self Care

## 2024-04-24 ENCOUNTER — Encounter: Payer: Self-pay | Admitting: Emergency Medicine

## 2024-04-24 DIAGNOSIS — M7989 Other specified soft tissue disorders: Secondary | ICD-10-CM

## 2024-04-24 DIAGNOSIS — I890 Lymphedema, not elsewhere classified: Secondary | ICD-10-CM

## 2024-04-24 DIAGNOSIS — R600 Localized edema: Secondary | ICD-10-CM | POA: Diagnosis not present

## 2024-04-24 NOTE — ED Triage Notes (Signed)
 Pt c/o bilateral leg swelling. Started about 2 weeks ago. Denies shortness of breath or chest pain. She states she has know lymphedema. She states her legs are sore but has not seen any weeping. Pt can not bend her legs.

## 2024-04-24 NOTE — ED Notes (Signed)
 Patient is being discharged from the Urgent Care and sent to the Emergency Department via private vehicle with family member . Per Jolan Host, PA, patient is in need of higher level of care due to Swelling and pitting edema in both legs. Patient is aware and verbalizes understanding of plan of care.  Vitals:   04/24/24 0909  BP: (!) 150/55  Pulse: 88  Resp: 18  Temp: 97.7 F (36.5 C)  SpO2: 96%

## 2024-04-24 NOTE — ED Provider Notes (Signed)
 [facility]    CSN: [id number] Arrival date & time: 04/24/24  9148      History   Chief Complaint Chief Complaint  Patient presents with   Leg Swelling    bilateral    HPI Nancy Riley is a 68 y.o. female with history of obesity, diabetes, GERD, hypertension, hyperlipidemia, spinal stenosis, lymphedema, PAD, and GERD.   Patient presents with family member for 2-week history of significant bilateral leg swelling.  She says they have doubled in size.  She also reports inability to bend the legs because of swelling.  She states she has been wearing her stockings for lymphedema but did not wear them today so we could see her legs.  She denies fatigue, dizziness, chest pain, palpitations, shortness of breath, abdominal pain.  She states she believes she has been urinating less but that could be because she is drinking less.  Patient takes HCTZ but says it has not been helping.  HPI  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 at [facility] in Nov 2020   Plantar fasciitis    Sciatica    Spondylodiscitis    lumbar spine (Nov 2020 at [facility])    Patient Active Problem List   Diagnosis Date Noted   Positive colorectal cancer screening using Cologuard test 02/12/2023   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/03/2021   Senile nuclear sclerosis, bilateral 12/03/2021   PAD (peripheral artery disease) 08/01/2021   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   Osteoarthritis 10/04/2019   Acute osteomyelitis of thoracic spine (HCC) 10/04/2019   Spondylodiscitis 04/26/2019   Lymphedema 04/26/2019   Morbid obesity (HCC) 04/26/2019   BMI  45.0-49.9, adult (HCC) 08/20/2018   Controlled type 2 diabetes mellitus without complication, without long-term current use of insulin (HCC) 11/26/2017   GERD (gastroesophageal reflux disease) 11/26/2017   Hyperlipidemia, mixed 11/26/2017   Hypertension, essential 11/26/2017   Chest pain at rest 11/26/2017    Past Surgical History:  Procedure Laterality Date   BREAST EXCISIONAL BIOPSY Left 1970   negative   COLONOSCOPY  10/2013   COLONOSCOPY WITH PROPOFOL  N/A 02/12/2023   Procedure: COLONOSCOPY WITH PROPOFOL ;  Surgeon: Therisa Bi, MD;  Location: [facility] ENDOSCOPY;  Service: Gastroenterology;  Laterality: N/A;   EYE SURGERY      OB History     Gravida  2   Para  2   Term      Preterm      AB      Living         SAB      IAB      Ectopic      Multiple      Live Births           Obstetric Comments  1st Menstrual Cycle:  14 1st Pregnancy:  22         Cardiovascular History & Procedures:  Echocardiogram:  2019: Normal EF, mild TR/MR 2021: EF >55%, no significant valvular abnl Stress test:  2019 SPECT: Normal CV Imaging:  none Cardiac catheterization:  none EP studies:  none CV surgeries:  none  Peripheral vascular:  none Anginal Equivalent: none   Home Medications    Prior to Admission medications   Medication Sig Start Date End Date Taking? Authorizing Provider  cyclobenzaprine  (FLEXERIL ) 5 MG tablet Take 5 mg by mouth 2 (two) times daily as needed for muscle spasms.    [provider]  diclofenac  Sodium (VOLTAREN ) 1 % GEL Apply topically 4 (four) times daily.    [provider]  FEROSUL 325 (65 Fe) MG tablet Take by mouth.    [provider]  gabapentin  (NEURONTIN ) 100 MG capsule Take 100 mg by mouth 3 (three) times daily.    [provider]  hydrochlorothiazide  (HYDRODIURIL ) 25 MG tablet Take 25 mg by mouth daily.    [provider]  ibuprofen (ADVIL) 800 MG tablet Take 800 mg by mouth every 8  (eight) hours as needed.    [provider]  lisinopril  (PRINIVIL ,ZESTRIL ) 10 MG tablet Take 10 mg by mouth daily.    [provider]  loratadine  (CLARITIN ) 10 MG tablet Take 10 mg by mouth daily.    [provider]  lovastatin (MEVACOR) 20 MG tablet Take 20 mg by mouth every evening.    [provider]  magnesium  oxide (MAG-OX) 400 MG tablet Take 1 tablet by mouth 2 (two) times daily.    [provider]  metFORMIN (GLUCOPHAGE-XR) 500 MG 24 hr tablet Take 500 mg by mouth daily.    [provider]  naproxen sodium (ALEVE) 220 MG tablet Take 220 mg by mouth daily as needed (pain).    [provider]  omeprazole (PRILOSEC) 20 MG capsule Take 20 mg by mouth daily. 07/31/20   [provider]  potassium chloride  (KLOR-CON  M) 10 MEQ tablet Take 10 mEq by mouth daily.    [provider]  potassium chloride  (MICRO-K ) 10 MEQ CR capsule Take 10 mEq by mouth daily.    [provider]  prednisoLONE acetate (PRED FORTE) 1 % ophthalmic suspension Apply 1 drop to eye. 09/06/22   [provider]  rosuvastatin (CRESTOR) 20 MG tablet Take 20 mg by mouth at bedtime.    [provider]  traMADol  (ULTRAM ) 50 MG tablet  12/11/23   [provider]  TRUE METRIX BLOOD GLUCOSE TEST test strip SMARTSIG:Via Meter 12/19/23   [provider]  TRUEplus Lancets 33G MISC SMARTSIG:Lancet Topical 12/19/23   [provider]    Family History Family History  Problem Relation Age of Onset   Breast cancer Neg Hx    Colon cancer Neg Hx     Social History Social History   Tobacco Use   Smoking status: Never    Passive exposure: Current   Smokeless tobacco: Never  Vaping Use   Vaping status: Never Used  Substance Use Topics   Alcohol use: Not Currently   Drug use: Never     Allergies   Patient has no known allergies.   Review of Systems Review of Systems  Constitutional:  Negative for  fatigue.  Respiratory:  Negative for shortness of breath.   Cardiovascular:  Positive for leg swelling. Negative for chest pain.  Gastrointestinal:  Negative for abdominal pain, nausea and vomiting.  Genitourinary:  Negative for difficulty urinating, dysuria and flank pain.  Musculoskeletal:  Negative for gait problem.  Skin:  Negative for color change, rash and wound.  Neurological:  Negative for dizziness, syncope, weakness and headaches.     Physical Exam Triage Vital Signs ED Triage Vitals  Encounter Vitals Group  BP      Girls Systolic BP Percentile      Girls Diastolic BP Percentile      Boys Systolic BP Percentile      Boys Diastolic BP Percentile      Pulse      Resp      Temp      Temp src      SpO2      Weight      Height      Head Circumference      Peak Flow      Pain Score      Pain Loc      Pain Education      Exclude from Growth Chart    No data found.  Updated Vital Signs BP (!) 150/55 (BP Location: Right Arm)   Pulse 88   Temp 97.7 F (36.5 C) (Oral)   Resp 18   SpO2 96%   Physical Exam Vitals and nursing note reviewed.  Constitutional:      General: She is not in acute distress.    Appearance: Normal appearance. She is obese. She is not ill-appearing or toxic-appearing.  HENT:     Head: Normocephalic and atraumatic.  Eyes:     General: No scleral icterus.       Right eye: No discharge.        Left eye: No discharge.     Conjunctiva/sclera: Conjunctivae normal.  Cardiovascular:     Rate and Rhythm: Normal rate and regular rhythm.     Heart sounds: Normal heart sounds.  Pulmonary:     Effort: Pulmonary effort is normal. No respiratory distress.     Breath sounds: Normal breath sounds.  Musculoskeletal:     Cervical back: Neck supple.     Right lower leg: Edema present.     Left lower leg: Edema present.     Comments: Severe nonpitting edema of entire bilateral lower extremities.  Skin:    General: Skin is dry.  Neurological:      General: No focal deficit present.     Mental Status: She is alert. Mental status is at baseline.     Motor: No weakness.     Gait: Gait normal.  Psychiatric:        Mood and Affect: Mood normal.        Behavior: Behavior normal.      UC Treatments / Results  Labs (all labs ordered are listed, but only abnormal results are displayed) Labs Reviewed - No data to display  EKG   Radiology No results found.  Procedures Procedures (including critical care time)  Medications Ordered in UC Medications - No data to display  Initial Impression / Assessment and Plan / UC Course  I have reviewed the triage vital signs and the nursing notes.  Pertinent labs & imaging results that were available during my care of the patient were reviewed by me and considered in my medical decision making (see chart for details).   68 with history of morbid obesity, diabetes, GERD, hypertension, hyperlipidemia, spinal stenosis, lymphedema, PAD, and GERD presents for 2-week history of increased leg swelling.  She says they have doubled in size.  She denies any other symptoms.  Taking HCTZ.  Blood pressure elevated 150/55.  Other vitals normal and stable.  Appears well overall.  She does have significant/severe swelling of entire bilateral lower extremities.  Difficult to determine if this is lymphedema or another type of edema.  Patient is also  morbidly obese which complicates physical exam.  I have advised patient she should seek further evaluation in the emergency department likely to include lab work and to be able to fully assess her heart, kidney function, check for heart failure and for potential treatment.  Patient is agreeable to go to emergency department.  Her daughter will take care.   Final Clinical Impressions(s) / UC Diagnoses   Final diagnoses:  Leg swelling  Peripheral edema  Lymphedema     Discharge Instructions      - As discussed, you need to go to the emergency department to  have labs checked and to be worked up for the cause of your swelling.  As discussed it could be related to underlying lymphedema, your heart, your kidneys, excetra.  Also, we do not have anything to help with the swelling especially since we do not know what is causing it.  Do not delay going to ER.  You have been advised to follow up immediately in the emergency department for concerning signs.symptoms. If you declined EMS transport, please have a family member take you directly to the ED at this time. Do not delay. Based on concerns about condition, if you do not follow up in th e ED, you may risk poor outcomes including worsening of condition, delayed treatment and potentially life threatening issues. If you have declined to go to the ED at this time, you should call your PCP immediately to set up a follow up appointment.  Go to ED for red flag symptoms, including; fevers you cannot reduce with Tylenol /Motrin, severe headaches, vision changes, numbness/weakness in part of the body, lethargy, confusion, intractable vomiting, severe dehydration, chest pain, breathing difficulty, severe persistent abdominal or pelvic pain, signs of severe infection (increased redness, swelling of an area), feeling faint or passing out, dizziness, etc. You should especially go to the ED for sudden acute worsening of condition if you do not elect to go at this time.      ED Prescriptions   None    PDMP not reviewed this encounter.   Arvis Jolan NOVAK, PA-C 04/25/24 [phone]

## 2024-04-24 NOTE — Discharge Instructions (Addendum)
-   As discussed, you need to go to the emergency department to have labs checked and to be worked up for the cause of your swelling.  As discussed it could be related to underlying lymphedema, your heart, your kidneys, excetra.  Also, we do not have anything to help with the swelling especially since we do not know what is causing it.  Do not delay going to ER.  You have been advised to follow up immediately in the emergency department for concerning signs.symptoms. If you declined EMS transport, please have a family member take you directly to the ED at this time. Do not delay. Based on concerns about condition, if you do not follow up in th e ED, you may risk poor outcomes including worsening of condition, delayed treatment and potentially life threatening issues. If you have declined to go to the ED at this time, you should call your PCP immediately to set up a follow up appointment.  Go to ED for red flag symptoms, including; fevers you cannot reduce with Tylenol /Motrin, severe headaches, vision changes, numbness/weakness in part of the body, lethargy, confusion, intractable vomiting, severe dehydration, chest pain, breathing difficulty, severe persistent abdominal or pelvic pain, signs of severe infection (increased redness, swelling of an area), feeling faint or passing out, dizziness, etc. You should especially go to the ED for sudden acute worsening of condition if you do not elect to go at this time.

## 2024-04-30 ENCOUNTER — Emergency Department

## 2024-04-30 ENCOUNTER — Emergency Department
Admission: EM | Admit: 2024-04-30 | Discharge: 2024-04-30 | Disposition: A | Discharge status: Home / Self Care | Attending: Emergency Medicine | Admitting: Emergency Medicine

## 2024-04-30 ENCOUNTER — Other Ambulatory Visit: Payer: Self-pay

## 2024-04-30 DIAGNOSIS — R10A2 Flank pain, left side: Secondary | ICD-10-CM | POA: Diagnosis not present

## 2024-04-30 DIAGNOSIS — R600 Localized edema: Secondary | ICD-10-CM | POA: Diagnosis not present

## 2024-04-30 DIAGNOSIS — M7989 Other specified soft tissue disorders: Secondary | ICD-10-CM | POA: Diagnosis present

## 2024-04-30 DIAGNOSIS — E119 Type 2 diabetes mellitus without complications: Secondary | ICD-10-CM | POA: Diagnosis not present

## 2024-04-30 DIAGNOSIS — R0600 Dyspnea, unspecified: Secondary | ICD-10-CM | POA: Insufficient documentation

## 2024-04-30 DIAGNOSIS — I509 Heart failure, unspecified: Secondary | ICD-10-CM | POA: Insufficient documentation

## 2024-04-30 DIAGNOSIS — Z79899 Other long term (current) drug therapy: Secondary | ICD-10-CM | POA: Insufficient documentation

## 2024-04-30 DIAGNOSIS — R0789 Other chest pain: Secondary | ICD-10-CM | POA: Diagnosis not present

## 2024-04-30 DIAGNOSIS — M5134 Other intervertebral disc degeneration, thoracic region: Secondary | ICD-10-CM | POA: Insufficient documentation

## 2024-04-30 DIAGNOSIS — I10 Essential (primary) hypertension: Secondary | ICD-10-CM | POA: Insufficient documentation

## 2024-04-30 DIAGNOSIS — K5730 Diverticulosis of large intestine without perforation or abscess without bleeding: Secondary | ICD-10-CM | POA: Insufficient documentation

## 2024-04-30 DIAGNOSIS — R0602 Shortness of breath: Secondary | ICD-10-CM | POA: Diagnosis not present

## 2024-04-30 DIAGNOSIS — R0609 Other forms of dyspnea: Secondary | ICD-10-CM

## 2024-04-30 LAB — URINALYSIS, ROUTINE W REFLEX MICROSCOPIC
Bilirubin Urine: NEGATIVE
Glucose, UA: NEGATIVE mg/dL
Hgb urine dipstick: NEGATIVE
Ketones, ur: NEGATIVE mg/dL
Nitrite: NEGATIVE
Protein, ur: NEGATIVE mg/dL
Specific Gravity, Urine: 1.012 (ref 1.005–1.030)
pH: 6 (ref 5.0–8.0)

## 2024-04-30 LAB — CBC WITH DIFFERENTIAL/PLATELET
Abs Immature Granulocytes: 0.02 K/uL (ref 0.00–0.07)
Basophils Absolute: 0 K/uL (ref 0.0–0.1)
Basophils Relative: 1 %
Eosinophils Absolute: 0.2 K/uL (ref 0.0–0.5)
Eosinophils Relative: 4 %
HCT: 31.5 % — ABNORMAL LOW (ref 36.0–46.0)
Hemoglobin: 9.6 g/dL — ABNORMAL LOW (ref 12.0–15.0)
Immature Granulocytes: 0 %
Lymphocytes Relative: 22 %
Lymphs Abs: 1.3 K/uL (ref 0.7–4.0)
MCH: 26.8 pg (ref 26.0–34.0)
MCHC: 30.5 g/dL (ref 30.0–36.0)
MCV: 88 fL (ref 80.0–100.0)
Monocytes Absolute: 0.6 K/uL (ref 0.1–1.0)
Monocytes Relative: 9 %
Neutro Abs: 3.8 K/uL (ref 1.7–7.7)
Neutrophils Relative %: 64 %
Platelets: 251 K/uL (ref 150–400)
RBC: 3.58 MIL/uL — ABNORMAL LOW (ref 3.87–5.11)
RDW: 13.3 % (ref 11.5–15.5)
WBC: 6 K/uL (ref 4.0–10.5)
nRBC: 0 % (ref 0.0–0.2)

## 2024-04-30 LAB — BLOOD GAS, VENOUS
Acid-Base Excess: 6.4 mmol/L — ABNORMAL HIGH (ref 0.0–2.0)
Bicarbonate: 31.9 mmol/L — ABNORMAL HIGH (ref 20.0–28.0)
O2 Saturation: 97.9 %
Patient temperature: 37
pCO2, Ven: 48 mmHg (ref 44–60)
pH, Ven: 7.43 (ref 7.25–7.43)
pO2, Ven: 79 mmHg — ABNORMAL HIGH (ref 32–45)

## 2024-04-30 LAB — COMPREHENSIVE METABOLIC PANEL WITH GFR
ALT: 18 U/L (ref 0–44)
AST: 23 U/L (ref 15–41)
Albumin: 4 g/dL (ref 3.5–5.0)
Alkaline Phosphatase: 119 U/L (ref 38–126)
Anion gap: 9 (ref 5–15)
BUN: 17 mg/dL (ref 8–23)
CO2: 29 mmol/L (ref 22–32)
Calcium: 9 mg/dL (ref 8.9–10.3)
Chloride: 102 mmol/L (ref 98–111)
Creatinine, Ser: 0.88 mg/dL (ref 0.44–1.00)
GFR, Estimated: >60 mL/min (ref >60)
Glucose, Bld: 142 mg/dL — ABNORMAL HIGH (ref 70–99)
Potassium: 3.9 mmol/L (ref 3.5–5.1)
Sodium: 139 mmol/L (ref 135–145)
Total Bilirubin: 0.3 mg/dL (ref 0.0–1.2)
Total Protein: 7.6 g/dL (ref 6.5–8.1)

## 2024-04-30 LAB — D-DIMER, QUANTITATIVE: D-Dimer, Quant: 2.06 ug{FEU}/mL — ABNORMAL HIGH (ref 0.00–0.50)

## 2024-04-30 LAB — LIPASE, BLOOD: Lipase: 23 U/L (ref 11–51)

## 2024-04-30 LAB — PRO BRAIN NATRIURETIC PEPTIDE: Pro Brain Natriuretic Peptide: <50 pg/mL (ref <300.0)

## 2024-04-30 LAB — RESP PANEL BY RT-PCR (RSV, FLU A&B, COVID)  RVPGX2
Influenza A by PCR: NEGATIVE
Influenza B by PCR: NEGATIVE
Resp Syncytial Virus by PCR: NEGATIVE
SARS Coronavirus 2 by RT PCR: NEGATIVE

## 2024-04-30 LAB — TROPONIN T, HIGH SENSITIVITY: Troponin T High Sensitivity: <15 ng/L (ref 0–19)

## 2024-04-30 MED ORDER — FUROSEMIDE 40 MG PO TABS: 40.0000 mg | ORAL_TABLET | Freq: Every day | ORAL | 0 refills | Status: DC

## 2024-04-30 MED ORDER — TRAMADOL HCL 50 MG PO TABS
50.0000 mg | ORAL_TABLET | Freq: Four times a day (QID) | ORAL | 0 refills | Status: AC | PRN
Start: 2024-04-30 — End: 2025-04-30

## 2024-04-30 MED ORDER — FENTANYL CITRATE (PF) 50 MCG/ML IJ SOSY
50.0000 ug | PREFILLED_SYRINGE | Freq: Once | INTRAMUSCULAR | Status: DC
Start: 2024-04-30 — End: 2024-04-30

## 2024-04-30 MED ORDER — ONDANSETRON HCL 4 MG/2ML IJ SOLN: 4.0000 mg | Freq: Once | INTRAMUSCULAR | Status: DC

## 2024-04-30 MED ORDER — ONDANSETRON 4 MG PO TBDP
4.0000 mg | ORAL_TABLET | Freq: Once | ORAL | Status: AC
  Administered 2024-04-30: 4 mg via ORAL
  Filled 2024-04-30: qty 1

## 2024-04-30 MED ORDER — IOHEXOL 350 MG/ML SOLN
100.0000 mL | Freq: Once | INTRAVENOUS | Status: AC | PRN
  Administered 2024-04-30: 100 mL via INTRAVENOUS

## 2024-04-30 MED ORDER — ACETAMINOPHEN 500 MG PO TABS
1000.0000 mg | ORAL_TABLET | Freq: Once | ORAL | Status: AC
  Administered 2024-04-30: 1000 mg via ORAL
  Filled 2024-04-30: qty 2

## 2024-04-30 MED ORDER — FUROSEMIDE 10 MG/ML IJ SOLN
40.0000 mg | Freq: Once | INTRAMUSCULAR | Status: AC
  Administered 2024-04-30: 40 mg via INTRAVENOUS
  Filled 2024-04-30: qty 4

## 2024-04-30 MED ORDER — IOHEXOL 350 MG/ML SOLN
125.0000 mL | Freq: Once | INTRAVENOUS | Status: AC | PRN
  Administered 2024-04-30: 125 mL via INTRAVENOUS

## 2024-04-30 MED ORDER — OXYCODONE HCL 5 MG PO TABS
5.0000 mg | ORAL_TABLET | Freq: Once | ORAL | Status: AC
  Administered 2024-04-30: 5 mg via ORAL
  Filled 2024-04-30: qty 1

## 2024-04-30 NOTE — ED Provider Notes (Addendum)
 [facility] Provider Note    Event Date/Time   First MD Initiated Contact with Patient 04/30/24 [phone]     (approximate)   History   Leg Swelling   HPI  Nancy Riley is a 68 y.o. female with history of morbid obesity, hypertension, hyperlipidemia, diabetes who presents to the emergency department with increasing leg swelling and discomfort over the past 2 weeks.  Takes HCTZ but no other diuretic.  Does not have a known history of CHF or CAD.  States today she also started having some left flank pain that she describes as pain in the lateral left chest and upper abdominal area.  She denies any fevers, cough or congestion, nausea, vomiting or diarrhea, dysuria or hematuria.  Denies history of PE or DVT.  She denies shortness of breath but appears very short of breath just getting from the stretcher to the bed and is unable to move without significant assistance.  She reports this is slightly worse than normal.  She lives at home alone.  Usually ambulates with a cane or walker.   History provided by patient, EMS.    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 at [facility] in Nov 2020   Plantar fasciitis    Sciatica    Spondylodiscitis    lumbar spine (Nov 2020 at [facility])    Past Surgical History:  Procedure Laterality Date   BREAST EXCISIONAL BIOPSY Left 1970   negative   COLONOSCOPY  10/2013   COLONOSCOPY WITH PROPOFOL  N/A 02/12/2023   Procedure: COLONOSCOPY WITH PROPOFOL ;  Surgeon: Therisa Bi, MD;  Location: [facility] ENDOSCOPY;  Service: Gastroenterology;  Laterality: N/A;   EYE SURGERY      MEDICATIONS:  Prior  to Admission medications   Medication Sig Start Date End Date Taking? Authorizing Provider  cyclobenzaprine  (FLEXERIL ) 5 MG tablet Take 5 mg by mouth 2 (two) times daily as needed for muscle spasms.    [provider]  diclofenac  Sodium (VOLTAREN ) 1 % GEL Apply topically 4 (four) times daily.    [provider]  FEROSUL 325 (65 Fe) MG tablet Take by mouth.    [provider]  gabapentin  (NEURONTIN ) 100 MG capsule Take 100 mg by mouth 3 (three) times daily.    [provider]  hydrochlorothiazide  (HYDRODIURIL ) 25 MG tablet Take 25 mg by mouth daily.    [provider]  ibuprofen (ADVIL) 800 MG tablet Take 800 mg by mouth every 8 (eight) hours as needed.    [provider]  lisinopril  (PRINIVIL ,ZESTRIL ) 10 MG tablet Take 10 mg by mouth daily.    [provider]  loratadine  (CLARITIN ) 10 MG tablet Take 10 mg by mouth daily.    [provider]  lovastatin (MEVACOR) 20 MG tablet Take 20 mg by mouth every evening.    [provider]  magnesium  oxide (MAG-OX) 400 MG tablet Take 1 tablet by mouth 2 (two) times daily.    [provider]  metFORMIN (GLUCOPHAGE-XR) 500 MG 24 hr tablet Take 500 mg by mouth daily.    [provider]  naproxen sodium (ALEVE) 220 MG tablet Take 220 mg by mouth daily as needed (pain).    [provider]  omeprazole (PRILOSEC) 20 MG capsule Take 20 mg by mouth daily. 07/31/20   [provider]  potassium chloride  (KLOR-CON  M) 10 MEQ tablet Take 10 mEq by mouth daily.    [provider]  potassium chloride  (MICRO-K ) 10 MEQ CR capsule Take 10 mEq by mouth daily.    [provider]  prednisoLONE acetate (PRED FORTE) 1 % ophthalmic suspension Apply 1 drop to eye. 09/06/22   [provider]  rosuvastatin (CRESTOR) 20 MG tablet Take 20 mg by mouth at bedtime.    [provider]  traMADol  (ULTRAM ) 50 MG tablet  12/11/23   [provider]  TRUE METRIX BLOOD GLUCOSE TEST test strip SMARTSIG:Via Meter 12/19/23   [provider]  TRUEplus Lancets 33G MISC SMARTSIG:Lancet Topical 12/19/23   [provider]    Physical Exam   Triage Vital Signs: ED Triage Vitals  Encounter Vitals Group     BP 04/30/24 0415 (!) 150/53     Girls Systolic BP Percentile --      Girls Diastolic BP Percentile --      Boys Systolic BP Percentile --      Boys Diastolic BP Percentile --      Pulse Rate 04/30/24 0415 84     Resp 04/30/24 0415 19     Temp 04/30/24 0415 98 F (36.7 C)     Temp Source 04/30/24 0415 Oral     SpO2 04/30/24 0415 100 %     Weight 04/30/24 0418 (!) 345 lb (156.5 kg)     Height 04/30/24 0418 5' 3 (1.6 m)     Head Circumference --      Peak Flow --      Pain Score 04/30/24 0414 10     Pain Loc --      Pain Education --      Exclude from Growth Chart --      Most recent vital signs: Vitals:   04/30/24 0415 04/30/24 0430  BP: (!) 150/53 (!) 152/48  Pulse: 84   Resp: 19 17  Temp: 98 F (36.7 C)   SpO2: 100% 99%    CONSTITUTIONAL: Alert, responds appropriately to questions.  Chronically ill-appearing HEAD: Normocephalic, atraumatic EYES: Conjunctivae clear, pupils appear equal, sclera nonicteric ENT: normal nose; moist mucous membranes NECK: Supple, normal ROM CARD: RRR; S1 and S2 appreciated RESP: Patient appears very short of breath and is tachypneic and speaking truncated sentences with just minimal exertion in the room.  No hypoxia.  Lungs seem clear to auscultation but examination is severely limited due to body habitus. ABD/GI: Non-distended; soft, non-tender, no rebound, no guarding, no peritoneal signs BACK: The back appears normal EXT: Normal ROM in all joints; no deformity noted, chronic appearing lymphedema to bilateral lower extremities.  Exam limited due to morbid obesity and body habitus.  Extremities warm and well-perfused.  No obvious signs of cellulitis. SKIN:  Normal color for age and race; warm; no rash on exposed skin NEURO: Moves all extremities equally, normal speech PSYCH: The patient's mood and manner are appropriate.   ED Results / Procedures / Treatments   LABS: (all labs ordered are listed, but only abnormal results are displayed) Labs Reviewed  CBC WITH DIFFERENTIAL/PLATELET - Abnormal; Notable for the following components:      Result Value   RBC 3.58 (*)    Hemoglobin 9.6 (*)  HCT 31.5 (*)    All other components within normal limits  COMPREHENSIVE METABOLIC PANEL WITH GFR - Abnormal; Notable for the following components:   Glucose, Bld 142 (*)    All other components within normal limits  BLOOD GAS, VENOUS - Abnormal; Notable for the following components:   pO2, Ven 79 (*)    Bicarbonate 31.9 (*)    Acid-Base Excess 6.4 (*)    All other components within normal limits  RESP PANEL BY RT-PCR (RSV, FLU A&B, COVID)  RVPGX2  PRO BRAIN NATRIURETIC PEPTIDE  LIPASE, BLOOD  URINALYSIS, ROUTINE W REFLEX MICROSCOPIC  D-DIMER, QUANTITATIVE  TROPONIN T, HIGH SENSITIVITY  TROPONIN T, HIGH SENSITIVITY     EKG:  EKG Interpretation Date/Time:  Friday April 30 2024 04:16:03 EST Ventricular Rate:  84 PR Interval:  178 QRS Duration:  104 QT Interval:  385 QTC Calculation: 456 R Axis:   -1  Text Interpretation: Sinus rhythm Nonspecific T abnormalities, inferior leads Confirmed by Neomi Neptune [phone]) on 04/30/2024 4:26:10 AM         RADIOLOGY: My personal review and interpretation of imaging: Chest x-ray clear.  I have personally reviewed all radiology reports.   DG Chest Portable 1 View Result Date: 04/30/2024 EXAM: 1 VIEW(S) XRAY OF THE CHEST 04/30/2024 04:31:00 AM COMPARISON: PA and lateral radiographs of the chest dated 11/18/2017. CLINICAL HISTORY: L sided chest pain, SOB. FINDINGS: LUNGS AND PLEURA: No focal pulmonary opacity. No pleural effusion. No pneumothorax. HEART AND MEDIASTINUM: Aortic calcification.  BONES AND SOFT TISSUES: No acute osseous abnormality. IMPRESSION: 1. No acute findings. Electronically signed by: Evalene Coho MD 04/30/2024 04:37 AM EST RP Workstation: [id number]     PROCEDURES:  Critical Care performed: No   .1-3 Lead EKG Interpretation  Performed by: Adem Costlow, Neptune SAILOR, DO Authorized by: River Mckercher, Neptune SAILOR, DO     Interpretation: normal     ECG rate:  84   ECG rate assessment: normal     Rhythm: sinus rhythm     Ectopy: none     Conduction: normal     Angiocath insertion Performed by: Neptune Neomi  Consent: Verbal consent obtained. Risks and benefits: risks, benefits and alternatives were discussed Time out: Immediately prior to procedure a time out was called to verify the correct patient, procedure, equipment, support staff and site/side marked as required.  Preparation: Patient was prepped and draped in the usual sterile fashion.  Vein Location: left AC  Ultrasound Guided  Gauge: 20  Normal blood return and flush without difficulty Patient tolerance: Patient tolerated the procedure well with no immediate complications.    IMPRESSION / MDM / ASSESSMENT AND PLAN / ED COURSE  I reviewed the triage vital signs and the nursing notes.    Patient here with complaints of increased lower extremity swelling over the past 2 weeks.  Appears very short of breath on exertion.  Also having left flank, lateral chest and upper abdominal pain.  The patient is on the cardiac monitor to evaluate for evidence of arrhythmia and/or significant heart rate changes.   DIFFERENTIAL DIAGNOSIS (includes but not limited to):   CHF, ACS, lymphedema, deconditioning secondary to morbid obesity and sedentary lifestyle, PE, DVT, pneumonia, colitis, pancreatitis, UTI, kidney stone, musculoskeletal pain   Patient's presentation is most consistent with acute presentation with potential threat to life or bodily function.   PLAN: EKG is nonischemic.  Will obtain labs, chest  x-ray.  Patient will likely need a CT of her chest, abdomen pelvis as I  suspect her x-ray will be limited due to body habitus.  Will give pain and nausea medicine here.   MEDICATIONS GIVEN IN ED: Medications  oxyCODONE  (Oxy IR/ROXICODONE ) immediate release tablet 5 mg (has no administration in time range)  ondansetron (ZOFRAN-ODT) disintegrating tablet 4 mg (has no administration in time range)  furosemide (LASIX) injection 40 mg (has no administration in time range)  iohexol (OMNIPAQUE) 350 MG/ML injection 125 mL (125 mLs Intravenous Contrast Given 04/30/24 0627)  acetaminophen  (TYLENOL ) tablet 1,000 mg (1,000 mg Oral Given 04/30/24 9352)     ED COURSE: 6:19 AM  Chest x-ray reviewed and interpreted by myself and the radiologist as clear.  First troponin negative.  BNP is normal.  This could be falsely low due to body habitus.  She is quite tachypneic and short of breath just with minimal exertion.  I am worried that she could have pulmonary edema but also at risk for PE given relatively sedentary lifestyle.  Will obtain CTA of the chest.  Pain seems to be mostly in the lower chest, flank and upper abdomen.  Will also obtain a CT of the abdomen pelvis.  LFTs, lipase unremarkable.  Urine pending.  6:50 AM  Pt's peripheral IV blew and had small extravasation of contrast.  Will keep arm elevated and apply ice.  Will attempt to place new peripheral IV.  Second troponin is pending.  Urine being sent down now.  Added on D-dimer to initial blood work as if this is negative she may be able to get noncontrast CTs of her chest, abdomen and pelvis.  IV is difficult to place in this patient due to body habitus.  Reports diffuse body pain of 10/10.  Given Tylenol .  She reports feeling she needs something stronger.  Will give oxycodone .  Signed out to oncoming EDP at 7am.   CONSULTS: Pending further workup.    OUTSIDE RECORDS REVIEWED: Reviewed cardiology note on 01/27/2024.       FINAL CLINICAL  IMPRESSION(S) / ED DIAGNOSES   Final diagnoses:  Peripheral edema  DOE (dyspnea on exertion)  Left flank pain     Rx / DC Orders   ED Discharge Orders     None        Note:  This document was prepared using Dragon voice recognition software and may include unintentional dictation errors.   Sanjit Mcmichael, Josette SAILOR, DO 04/30/24 0701    Holden Draughon, Josette SAILOR, DO 04/30/24 9298

## 2024-04-30 NOTE — ED Notes (Signed)
 Called CCMD spoke with Kerrin to place pt on cardiac monitor

## 2024-04-30 NOTE — ED Notes (Signed)
 Pericare provided after purewick leaked. New linens applied.warm blankets given. Pt tolerated well.

## 2024-04-30 NOTE — ED Notes (Signed)
 Pt stated we get one chance to stick her for IV access. Two RNs assessed pt and did not see anything that guaranteed a successful stick. IV team consult placed.

## 2024-04-30 NOTE — ED Notes (Signed)
 Called CT to advise them on new IV placement

## 2024-04-30 NOTE — ED Notes (Signed)
 This RN went over D/C instructions and medications. Questions answered. No further needs expressed. Pt awaiting Son to come to bring transportation that is adequate for pts discharge.

## 2024-04-30 NOTE — ED Notes (Signed)
 RN called respiratory to let them know a VBG was sent.

## 2024-04-30 NOTE — ED Triage Notes (Signed)
 Pt to ED via [facility] from home for bilateral leg swelling. Started about 2 weeks ago. Denies shortness of breath or chest pain. She states she has know lymphedema. She states her legs are sore but has not seen any weeping. Pt can not bend her legs.  Pt was seen at urgent care on 11/22 and instructed to come to the ED but did not.   EDP at bedside upon pt's arrival.

## 2024-04-30 NOTE — ED Provider Notes (Signed)
 Asked to follow-up on CT scans and respiratory panel.  All of these are reassuring.  Patient is feeling improved.  No indication for admission at this time, we will treat with a week of p.o. Lasix analgesics, she will follow-up closely with her PCP, return precautions discussed, she agrees with this plan   Arlander Charleston, MD 04/30/24 [phone]

## 2024-04-30 NOTE — ED Notes (Signed)
 Fall risk bundle is currently in place, unable to place non skid socks on due to swelling.

## 2024-04-30 NOTE — ED Notes (Signed)
 Pt to imaging

## 2024-05-05 ENCOUNTER — Emergency Department

## 2024-05-05 ENCOUNTER — Observation Stay: Admit: 2024-05-05 | Discharge: 2024-05-05 | Disposition: A | Attending: Hospitalist

## 2024-05-05 ENCOUNTER — Observation Stay
Admission: EM | Admit: 2024-05-05 | Discharge: 2024-05-07 | Disposition: A | Discharge status: Home / Self Care | Attending: Student | Admitting: Student

## 2024-05-05 ENCOUNTER — Other Ambulatory Visit: Payer: Self-pay

## 2024-05-05 DIAGNOSIS — I10 Essential (primary) hypertension: Secondary | ICD-10-CM | POA: Insufficient documentation

## 2024-05-05 DIAGNOSIS — R519 Headache, unspecified: Secondary | ICD-10-CM | POA: Diagnosis not present

## 2024-05-05 DIAGNOSIS — K219 Gastro-esophageal reflux disease without esophagitis: Secondary | ICD-10-CM | POA: Diagnosis present

## 2024-05-05 DIAGNOSIS — N179 Acute kidney failure, unspecified: Principal | ICD-10-CM | POA: Diagnosis present

## 2024-05-05 DIAGNOSIS — M7989 Other specified soft tissue disorders: Principal | ICD-10-CM

## 2024-05-05 DIAGNOSIS — E785 Hyperlipidemia, unspecified: Secondary | ICD-10-CM | POA: Insufficient documentation

## 2024-05-05 DIAGNOSIS — Z23 Encounter for immunization: Secondary | ICD-10-CM | POA: Diagnosis not present

## 2024-05-05 DIAGNOSIS — E119 Type 2 diabetes mellitus without complications: Secondary | ICD-10-CM | POA: Insufficient documentation

## 2024-05-05 DIAGNOSIS — D649 Anemia, unspecified: Secondary | ICD-10-CM | POA: Diagnosis not present

## 2024-05-05 DIAGNOSIS — Z79899 Other long term (current) drug therapy: Secondary | ICD-10-CM | POA: Diagnosis not present

## 2024-05-05 DIAGNOSIS — R531 Weakness: Secondary | ICD-10-CM

## 2024-05-05 DIAGNOSIS — I890 Lymphedema, not elsewhere classified: Secondary | ICD-10-CM | POA: Insufficient documentation

## 2024-05-05 DIAGNOSIS — R600 Localized edema: Secondary | ICD-10-CM | POA: Diagnosis present

## 2024-05-05 DIAGNOSIS — Z6841 Body Mass Index (BMI) 40.0 and over, adult: Secondary | ICD-10-CM | POA: Diagnosis not present

## 2024-05-05 DIAGNOSIS — Z794 Long term (current) use of insulin: Secondary | ICD-10-CM | POA: Insufficient documentation

## 2024-05-05 DIAGNOSIS — E782 Mixed hyperlipidemia: Secondary | ICD-10-CM | POA: Insufficient documentation

## 2024-05-05 LAB — IRON AND TIBC
Iron: 40 ug/dL (ref 28–170)
Saturation Ratios: 16 % (ref 10.4–31.8)
TIBC: 256 ug/dL (ref 250–450)
UIBC: 216 ug/dL

## 2024-05-05 LAB — URINALYSIS, ROUTINE W REFLEX MICROSCOPIC
Bilirubin Urine: NEGATIVE
Glucose, UA: NEGATIVE mg/dL
Hgb urine dipstick: NEGATIVE
Ketones, ur: NEGATIVE mg/dL
Nitrite: POSITIVE — AB
Protein, ur: NEGATIVE mg/dL
Specific Gravity, Urine: 1.005 (ref 1.005–1.030)
pH: 6 (ref 5.0–8.0)

## 2024-05-05 LAB — CBC WITH DIFFERENTIAL/PLATELET
Abs Immature Granulocytes: 0.03 K/uL (ref 0.00–0.07)
Basophils Absolute: 0 K/uL (ref 0.0–0.1)
Basophils Relative: 0 %
Eosinophils Absolute: 0.1 K/uL (ref 0.0–0.5)
Eosinophils Relative: 3 %
HCT: 28.3 % — ABNORMAL LOW (ref 36.0–46.0)
Hemoglobin: 8.7 g/dL — ABNORMAL LOW (ref 12.0–15.0)
Immature Granulocytes: 1 %
Lymphocytes Relative: 21 %
Lymphs Abs: 1 K/uL (ref 0.7–4.0)
MCH: 26.7 pg (ref 26.0–34.0)
MCHC: 30.7 g/dL (ref 30.0–36.0)
MCV: 86.8 fL (ref 80.0–100.0)
Monocytes Absolute: 0.6 K/uL (ref 0.1–1.0)
Monocytes Relative: 12 %
Neutro Abs: 3 K/uL (ref 1.7–7.7)
Neutrophils Relative %: 63 %
Platelets: 254 K/uL (ref 150–400)
RBC: 3.26 MIL/uL — ABNORMAL LOW (ref 3.87–5.11)
RDW: 13.2 % (ref 11.5–15.5)
WBC: 4.8 K/uL (ref 4.0–10.5)
nRBC: 0 % (ref 0.0–0.2)

## 2024-05-05 LAB — COMPREHENSIVE METABOLIC PANEL WITH GFR
ALT: 18 U/L (ref 0–44)
AST: 27 U/L (ref 15–41)
Albumin: 3.8 g/dL (ref 3.5–5.0)
Alkaline Phosphatase: 111 U/L (ref 38–126)
Anion gap: 9 (ref 5–15)
BUN: 39 mg/dL — ABNORMAL HIGH (ref 8–23)
CO2: 28 mmol/L (ref 22–32)
Calcium: 8.8 mg/dL — ABNORMAL LOW (ref 8.9–10.3)
Chloride: 98 mmol/L (ref 98–111)
Creatinine, Ser: 2.25 mg/dL — ABNORMAL HIGH (ref 0.44–1.00)
GFR, Estimated: 23 mL/min — ABNORMAL LOW (ref >60)
Glucose, Bld: 120 mg/dL — ABNORMAL HIGH (ref 70–99)
Potassium: 4.9 mmol/L (ref 3.5–5.1)
Sodium: 135 mmol/L (ref 135–145)
Total Bilirubin: 0.2 mg/dL (ref 0.0–1.2)
Total Protein: 7.1 g/dL (ref 6.5–8.1)

## 2024-05-05 LAB — FERRITIN: Ferritin: 436 ng/mL — ABNORMAL HIGH (ref 11–307)

## 2024-05-05 LAB — VITAMIN B12: Vitamin B-12: 525 pg/mL (ref 180–914)

## 2024-05-05 LAB — TROPONIN T, HIGH SENSITIVITY
Troponin T High Sensitivity: 16 ng/L (ref 0–19)
Troponin T High Sensitivity: 15 ng/L (ref 0–19)

## 2024-05-05 LAB — HIV ANTIBODY (ROUTINE TESTING W REFLEX): HIV Screen 4th Generation wRfx: NONREACTIVE

## 2024-05-05 LAB — PRO BRAIN NATRIURETIC PEPTIDE: Pro Brain Natriuretic Peptide: 77.8 pg/mL (ref <300.0)

## 2024-05-05 LAB — TSH: TSH: 2.72 u[IU]/mL (ref 0.350–4.500)

## 2024-05-05 LAB — FOLATE: Folate: >20 ng/mL (ref >5.9)

## 2024-05-05 MED ORDER — ONDANSETRON HCL 4 MG PO TABS: 4.0000 mg | ORAL_TABLET | Freq: Four times a day (QID) | ORAL | Status: DC | PRN

## 2024-05-05 MED ORDER — ONDANSETRON HCL 4 MG/2ML IJ SOLN: 4.0000 mg | Freq: Four times a day (QID) | INTRAMUSCULAR | Status: DC | PRN

## 2024-05-05 MED ORDER — POLYETHYLENE GLYCOL 3350 17 G PO PACK
17.0000 g | PACK | Freq: Every day | ORAL | Status: DC | PRN
  Administered 2024-05-06: 17 g via ORAL
  Filled 2024-05-05: qty 1

## 2024-05-05 MED ORDER — GABAPENTIN 300 MG PO CAPS
300.0000 mg | ORAL_CAPSULE | Freq: Three times a day (TID) | ORAL | Status: DC
  Administered 2024-05-05 – 2024-05-07 (×6): 300 mg via ORAL
  Filled 2024-05-05 (×6): qty 1

## 2024-05-05 MED ORDER — PNEUMOCOCCAL 20-VAL CONJ VACC 0.5 ML IM SUSY
0.5000 mL | PREFILLED_SYRINGE | INTRAMUSCULAR | Status: AC
  Administered 2024-05-06: 0.5 mL via INTRAMUSCULAR
  Filled 2024-05-05: qty 0.5

## 2024-05-05 MED ORDER — ROSUVASTATIN CALCIUM 20 MG PO TABS
20.0000 mg | ORAL_TABLET | Freq: Every day | ORAL | Status: DC
  Administered 2024-05-05 – 2024-05-06 (×2): 20 mg via ORAL
  Filled 2024-05-05 (×2): qty 1

## 2024-05-05 MED ORDER — TRAMADOL HCL 50 MG PO TABS
50.0000 mg | ORAL_TABLET | Freq: Four times a day (QID) | ORAL | Status: DC | PRN
  Administered 2024-05-07: 50 mg via ORAL
  Filled 2024-05-05: qty 1

## 2024-05-05 MED ORDER — HEPARIN SODIUM (PORCINE) 5000 UNIT/ML IJ SOLN
5000.0000 [IU] | Freq: Three times a day (TID) | INTRAMUSCULAR | Status: DC
  Administered 2024-05-05 – 2024-05-07 (×6): 5000 [IU] via SUBCUTANEOUS
  Filled 2024-05-05 (×6): qty 1

## 2024-05-05 MED ORDER — PANTOPRAZOLE SODIUM 40 MG PO TBEC
40.0000 mg | DELAYED_RELEASE_TABLET | Freq: Two times a day (BID) | ORAL | Status: AC
  Administered 2024-05-05 – 2024-05-06 (×3): 40 mg via ORAL
  Filled 2024-05-05 (×3): qty 1

## 2024-05-05 MED ORDER — ACETAMINOPHEN 325 MG PO TABS: 650.0000 mg | ORAL_TABLET | Freq: Four times a day (QID) | ORAL | Status: DC | PRN

## 2024-05-05 MED ORDER — SODIUM CHLORIDE 0.9 % IV SOLN: INTRAVENOUS | Status: DC

## 2024-05-05 MED ORDER — OXYCODONE HCL 5 MG PO TABS: 5.0000 mg | ORAL_TABLET | ORAL | Status: DC | PRN

## 2024-05-05 MED ORDER — FERROUS SULFATE 325 (65 FE) MG PO TABS
325.0000 mg | ORAL_TABLET | Freq: Two times a day (BID) | ORAL | Status: DC
  Administered 2024-05-05 – 2024-05-07 (×4): 325 mg via ORAL
  Filled 2024-05-05 (×4): qty 1

## 2024-05-05 MED ORDER — ACETAMINOPHEN 650 MG RE SUPP: 650.0000 mg | Freq: Four times a day (QID) | RECTAL | Status: DC | PRN

## 2024-05-05 NOTE — ED Notes (Signed)
 This RN called to request lab draw.

## 2024-05-05 NOTE — ED Notes (Signed)
 Called CCMD to place pt on central monitoring

## 2024-05-05 NOTE — Evaluation (Signed)
 Physical Therapy Evaluation Patient Details Name: Nancy Riley MRN: [id number] DOB: 1956-06-01 Today's Date: 05/05/2024  History of Present Illness  68 y.o. female with medical history significant for chronic lymphedema, obesity, diabetes, GERD, HTN, HLD, chronic anemia on iron supplementation, spinal stenosis who came into ED at [facility] complaining of not feeling well and leg swelling.  Patient had a similar visit to [facility] ED on 04/30/2024 and 04/24/2024.  Clinical Impression  Pt pleasant and willing to do some activity with PT, family present and helpful/encouraging.  Pt was able to do some bed mobility but is highly limited with strength, ROM, against gravity movement 2/2 significant LE swelling. Pt needed assist to move LEs out of bed and very heavy assist to get them back up into bed.  She was able to rise from elevated bed height (sleeps in lift recliner) with assist and take a few small side shuffling steps.  Pt will benefit from continued PT to address functional limitations.        If plan is discharge home, recommend the following: A lot of help with walking and/or transfers;A lot of help with bathing/dressing/bathroom;Assistance with cooking/housework;Assist for transportation;Help with stairs or ramp for entrance   Can travel by private vehicle   Yes    Equipment Recommendations  (TBD at rehab)  Recommendations for Other Services       Functional Status Assessment Patient has had a recent decline in their functional status and demonstrates the ability to make significant improvements in function in a reasonable and predictable amount of time.     Precautions / Restrictions Precautions Precautions: Fall Restrictions Weight Bearing Restrictions Per Provider Order: No      Mobility  Bed Mobility Overal bed mobility: Needs Assistance Bed Mobility: Supine to Sit, Sit to Supine     Supine to sit: Max assist, Mod assist Sit to supine: Max assist, +2 for physical  assistance   General bed mobility comments: pt able to slowly scoot legs toward EOB, but ultimately needed considerable assit to fully get off EOB and to get trunk to upright.  Even heavier assist to lift LEs against gravity to get back into bed.  Pt able to do some bridging to move in bed.    Transfers Overall transfer level: Needs assistance Equipment used: Rolling walker (2 wheels) Transfers: Sit to/from Stand Sit to Stand: Mod assist, From elevated surface           General transfer comment: unable to rise from standard height bed.  stood X 2 from ~3 raised bed and moderate assist to keep weight shifting forward during transition    Ambulation/Gait               General Gait Details: no true ambulation, but able to take a few small, effortful side shuffling steps along EOB - heavy UE use on walker  Stairs            Wheelchair Mobility     Tilt Bed    Modified Rankin (Stroke Patients Only)       Balance Overall balance assessment: Needs assistance Sitting-balance support: No upper extremity supported Sitting balance-Leahy Scale: Good     Standing balance support: Bilateral upper extremity supported Standing balance-Leahy Scale: Poor Standing balance comment: highly reliant on UEs, LE swelling/size necessitated wide stance                             Pertinent Vitals/Pain Pain Assessment  Pain Assessment: 0-10 Pain Score: 4  Pain Location: acute on chronic back and b/l knee pain    Home Living Family/patient expects to be discharged to:: Private residence Living Arrangements: Alone Available Help at Discharge: Available PRN/intermittently   Home Access: Ramped entrance         Home Equipment: Rollator (4 wheels)      Prior Function Prior Level of Function : Needs assist             Mobility Comments: Pt sleeps in a lift chair, does get out of the house, drives across the street and goes up ramp to cook breakfast for  mother almost daily, limited ambulation (<100 ft) at any one time with 4WW       Extremity/Trunk Assessment   Upper Extremity Assessment Upper Extremity Assessment: Generalized weakness    Lower Extremity Assessment Lower Extremity Assessment: Generalized weakness (severe swelling/body habitus issues that signficantly limited AROM and ROM in general)       Communication   Communication Communication: No apparent difficulties    Cognition Arousal: Alert Behavior During Therapy: WFL for tasks assessed/performed   PT - Cognitive impairments: No apparent impairments                         Following commands: Intact       Cueing Cueing Techniques: Verbal cues     General Comments      Exercises     Assessment/Plan    PT Assessment Patient needs continued PT services  PT Problem List Decreased strength;Decreased range of motion;Decreased activity tolerance;Decreased balance;Decreased mobility;Decreased knowledge of use of DME;Decreased safety awareness       PT Treatment Interventions DME instruction;Gait training;Functional mobility training;Therapeutic activities;Therapeutic exercise;Balance training;Neuromuscular re-education;Patient/family education    PT Goals (Current goals can be found in the Care Plan section)  Acute Rehab PT Goals Patient Stated Goal: Get back home PT Goal Formulation: With patient/family Time For Goal Achievement: 05/18/24 Potential to Achieve Goals: Fair    Frequency Min 2X/week     Co-evaluation               AM-PAC PT 6 Clicks Mobility  Outcome Measure Help needed turning from your back to your side while in a flat bed without using bedrails?: A Little Help needed moving from lying on your back to sitting on the side of a flat bed without using bedrails?: A Lot Help needed moving to and from a bed to a chair (including a wheelchair)?: A Lot Help needed standing up from a chair using your arms (e.g., wheelchair  or bedside chair)?: A Lot Help needed to walk in hospital room?: A Lot Help needed climbing 3-5 steps with a railing? : Total 6 Click Score: 12    End of Session Equipment Utilized During Treatment: Gait belt Activity Tolerance: Patient limited by fatigue;Patient limited by pain Patient left: with bed alarm set;with call bell/phone within reach Nurse Communication: Mobility status PT Visit Diagnosis: Muscle weakness (generalized) (M62.81);Difficulty in walking, not elsewhere classified (R26.2);Pain Pain - Right/Left: Right (b/l) Pain - part of body: Knee    Time: 1721-1748 PT Time Calculation (min) (ACUTE ONLY): 27 min   Charges:   PT Evaluation $PT Eval Low Complexity: 1 Low PT Treatments $Therapeutic Activity: 8-22 mins PT General Charges $$ ACUTE PT VISIT: 1 Visit         Carmin JONELLE Deed, DPT 05/05/2024, 6:15 PM

## 2024-05-05 NOTE — ED Provider Notes (Signed)
 [facility] Provider Note    Event Date/Time   First MD Initiated Contact with Patient 05/05/24 0813     (approximate)   History   Leg Swelling   HPI  Nancy Riley is a 68 y.o. female with obesity, diabetes, GERD, hypertension, hyperlipidemia, spinal stenosis, lymphedema who comes in with leg swelling.  This is patient's third visit for leg swelling.  Patient reports her legs have continued to increase in size.  She was seen here on 11/22 and had not been taking her HCTZ so they had recommended to go to an emergency room to be evaluated.  She went to the emergency room on 11/28 and had CT PE CT abdomen that were overall reassuring.  She reports that she was prescribed Lasix and she has been taking it was not had any good urination for 2 days so stopped taking it.  Therefore she reports having come back in due to the swelling continue to get worse and now having difficulties ambulating secondary it to it.  She also reports having a near fall today but did not fall down to her ground or hit her head.  She just kind of slipped down.  She also reports feeling like her left side has been more weak for the past couple of days as well.    Physical Exam   Triage Vital Signs: ED Triage Vitals  Encounter Vitals Group     BP 05/05/24 0821 130/60     Girls Systolic BP Percentile --      Girls Diastolic BP Percentile --      Boys Systolic BP Percentile --      Boys Diastolic BP Percentile --      Pulse Rate 05/05/24 0821 89     Resp 05/05/24 0821 14     Temp 05/05/24 0821 97.7 F (36.5 C)     Temp src --      SpO2 05/05/24 0821 100 %     Weight 05/05/24 0822 (!) 345 lb (156.5 kg)     Height 05/05/24 0822 5' 3 (1.6 m)     Head Circumference --      Peak Flow --      Pain Score 05/05/24 0822 10     Pain Loc --      Pain Education --      Exclude from Growth Chart --     Most recent vital signs: Vitals:   05/05/24 0821  BP: 130/60  Pulse: 89  Resp: 14   Temp: 97.7 F (36.5 C)  SpO2: 100%     General: Awake, no distress.  CV:  Good peripheral perfusion.  Resp:  Normal effort.  Abd:  No distention.  Soft and nontender Other:  Significant swelling noted bilaterally without any pitting edema   ED Results / Procedures / Treatments   Labs (all labs ordered are listed, but only abnormal results are displayed) Labs Reviewed  CBC WITH DIFFERENTIAL/PLATELET  COMPREHENSIVE METABOLIC PANEL WITH GFR  PRO BRAIN NATRIURETIC PEPTIDE  URINALYSIS, ROUTINE W REFLEX MICROSCOPIC  TROPONIN T, HIGH SENSITIVITY     EKG  My interpretation of EKG:  Normal sinus rhythm 86 non-ST-elevation or T wave versions, normal intervals  RADIOLOGY I have reviewed the xray personally and interpreted no evidence of any obvious edema   PROCEDURES:  Critical Care performed: No  Procedures   MEDICATIONS ORDERED IN ED: Medications - No data to display   IMPRESSION / MDM / ASSESSMENT AND PLAN /  ED COURSE  I reviewed the triage vital signs and the nursing notes.   Patient's presentation is most consistent with acute presentation with potential threat to life or bodily function.   Patient comes in with concerns for continuing worsening leg swelling without any improvement on oral Lasix.  Patient's BNP last time was normal but this could be false given patient's morbid obesity.  Exam is difficult to tell if this is lymphedema, obesity versus fluid accumulation.  Will recheck labs to make sure no evidence of dehydration or other causes of leg swelling.  She had PE scan that was recently negative for PE so this seems less likely.  Her neurological exam she reports some weakness on the left side but she actually has pretty good grip strength and no pronator drift noted.  Last known well was over 24 hours ago so stroke code not called.  Did order CT head to ensure no evidence of mass, infarct and these were negative.  Patient's blood work was notable for new AKI.   Hemoglobin slightly downtrending and BNP is normal although this can be falsely normal in patients with obesity.  Patient has a very difficult exam to tell if this is lymphedema and maybe the Lasix just made her have developed AKI which contributed to her weakness and she actually is volume down intravascularly.  I do think she would could benefit form admission, echocardiogram and correction of AKI given her new onset weakness leading to near syncopal episode.  Discussed this with patient she felt comfortable with this plan and will admit to the hospital team.    The patient is on the cardiac monitor to evaluate for evidence of arrhythmia and/or significant heart rate changes.      FINAL CLINICAL IMPRESSION(S) / ED DIAGNOSES   Final diagnoses:  Leg swelling  AKI (acute kidney injury)  Weakness     Rx / DC Orders   ED Discharge Orders     None        Note:  This document was prepared using Dragon voice recognition software and may include unintentional dictation errors.   Ernest Ronal BRAVO, MD 05/05/24 1055

## 2024-05-05 NOTE — ED Notes (Signed)
 Unsuccessful IV attempt by this RN.

## 2024-05-05 NOTE — ED Triage Notes (Signed)
 Pt to ED via [facility] from home for bilateral leg swelling. Pt was at the hospital on 11/28 for same. Pt has not taken Lasix x2 days due to not feeling that is is working properly. Hx of lymphedema, HTN, Diabetes, degenerative disks in back. Pt reports difficulty ambulating with excess fluid in her legs. A/Ox4.  130/60 HR 92 100% RA

## 2024-05-05 NOTE — H&P (Signed)
 History and Physical    Nancy Riley FMW:[id number] DOB: [birth year] DOA: 05/05/2024  DOS: the patient was seen and examined on 05/05/2024  PCP: [place] HERO, MD   Patient coming from: Home  I have personally briefly reviewed patient's old medical records in [facility]  Chief Complaint: Not feeling well/leg swelling  HPI: Nancy Riley is a pleasant 68 y.o. female with medical history significant for chronic lymphedema, obesity, diabetes, GERD, HTN, HLD, chronic anemia on iron supplementation, spinal stenosis who came into ED at [facility] complaining of not feeling well and leg swelling.  Patient had a similar visit to [facility] ED on 04/30/2024 and 04/24/2024.  On 11/22 2025 patient was prescribed hydrochlorothiazide  and discharged home.  On 04/30/2024, patient was given Lasix for a week and was discharged from the emergency room as there was no hypoxemia.  Patient went home took Lasix for 5 days and she did not feel better rather she felt worse and she did not even have urination after taking those medications.  She stopped taking diuretics 2 days ago.  Patient stated that she has been having this lymphedema for years but she felt that this has been getting slightly worse.  So she came into ED on 11 8/22 and 11/28.  Now she feels slightly better but overall she does not feel good.  She denies any fever, chills, nausea, vomiting, diarrhea, abdominal pain. She complains that she was not able to urinate even after diuretics.  Yesterday, she felt weak and she was almost slipped down at her home but did not hit her head or did not injure any part of the body.  ED Course: Upon arrival to the ED, patient is found to have lymphedema on both lower extremities, EKG showed normal sinus rhythm, x-ray showed no evidence of congestion.  She had creatinine 0.8 a week ago now it is 2.25.  Hospital service was consulted for evaluation for admission for AKI.  Review of Systems:  ROS  All other systems  negative except as noted in the HPI.  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 at [facility] in Nov 2020   Plantar fasciitis    Sciatica    Spondylodiscitis    lumbar spine (Nov 2020 at [facility])    Past Surgical History:  Procedure Laterality Date   BREAST EXCISIONAL BIOPSY Left 1970   negative   COLONOSCOPY  10/2013   COLONOSCOPY WITH PROPOFOL  N/A 02/12/2023   Procedure: COLONOSCOPY WITH PROPOFOL ;  Surgeon: Therisa Bi, MD;  Location: [facility] ENDOSCOPY;  Service: Gastroenterology;  Laterality: N/A;   EYE SURGERY       reports that she has never smoked. She has been exposed to tobacco smoke. She has never used smokeless tobacco. She reports that she does not currently use alcohol. She reports that she does not use drugs.  No Known Allergies  Family History  Problem Relation Age of Onset   Breast cancer Neg Hx    Colon cancer Neg Hx     Prior to Admission medications   Medication Sig Start Date End Date Taking? Authorizing Provider  Alcohol Swabs (DROPSAFE ALCOHOL PREP) 70 % PADS Apply topically. 04/28/24  Yes [provider]  cyclobenzaprine  (FLEXERIL ) 10 MG tablet Take 10 mg by mouth 3 (three) times daily as needed. 03/01/24  Yes [provider]  diclofenac  Sodium (VOLTAREN ) 1 % GEL Apply topically 4 (four) times daily.   Yes [provider]  FEROSUL 325 (65 Fe) MG tablet Take by mouth.   Yes [provider]  furosemide (LASIX) 40 MG tablet Take 1 tablet (40 mg total) by mouth daily for 7 days. 04/30/24 05/07/24 Yes Arlander Charleston, MD  gabapentin  (NEURONTIN ) 300 MG capsule Take 300 mg by mouth 3 (three)  times daily.   Yes [provider]  hydrochlorothiazide  (HYDRODIURIL ) 25 MG tablet Take 25 mg by mouth daily.   Yes [provider]  ibuprofen (ADVIL) 800 MG tablet Take 800 mg by mouth every 8 (eight) hours as needed.   Yes [provider]  lovastatin (MEVACOR) 20 MG tablet Take 20 mg by mouth every evening.   Yes [provider]  magnesium  oxide (MAG-OX) 400 MG tablet Take 1 tablet by mouth 2 (two) times daily.   Yes [provider]  metFORMIN (GLUCOPHAGE-XR) 500 MG 24 hr tablet Take 500 mg by mouth daily.   Yes [provider]  olmesartan (BENICAR) 20 MG tablet Take 20 mg by mouth daily.   Yes [provider]  omeprazole (PRILOSEC) 20 MG capsule Take 20 mg by mouth daily. 07/31/20  Yes [provider]  potassium chloride  (KLOR-CON  M) 10 MEQ tablet Take 10 mEq by mouth daily.   Yes [provider]  rosuvastatin (CRESTOR) 20 MG tablet Take 20 mg by mouth at bedtime.   Yes [provider]  traMADol  (ULTRAM ) 50 MG tablet Take 1 tablet (50 mg total) by mouth every 6 (six) hours as needed. 04/30/24 04/30/25 Yes Arlander Charleston, MD  lisinopril  (PRINIVIL ,ZESTRIL ) 10 MG tablet Take 10 mg by mouth daily. Patient not taking: Reported on 05/05/2024    [provider]  loratadine  (CLARITIN ) 10 MG tablet Take 10 mg by mouth daily.    [provider]  naproxen sodium (ALEVE) 220 MG tablet Take 220 mg by mouth daily as needed (pain). Patient not taking: Reported on 05/05/2024    [provider]  potassium chloride  (MICRO-K ) 10 MEQ CR capsule Take 10 mEq by mouth daily.    [provider]  prednisoLONE acetate (PRED FORTE) 1 % ophthalmic suspension Apply 1 drop to eye. Patient not taking: Reported on 05/05/2024 09/06/22   [provider]  TRUE METRIX BLOOD GLUCOSE TEST test strip SMARTSIG:Via Meter 12/19/23   [provider]  TRUEplus Lancets 33G MISC SMARTSIG:Lancet Topical  12/19/23   [provider]    Physical Exam: Vitals:   05/05/24 0822 05/05/24 1045 05/05/24 1115 05/05/24 1130  BP:      Pulse:  74 72 75  Resp:  14 14 12   Temp:      SpO2:  100% 100% 100%  Weight: (!) 156.5 kg     Height: 5' 3 (1.6 m)       Physical Exam   Constitutional: Alert, awake, calm, comfortable HEENT: Neck supple Respiratory: Clear to auscultation B/L, no wheezing, no rales.  Cardiovascular: Regular rate and rhythm, no murmurs / rubs / gallops.  Bilateral lymphedema. 2+ pedal pulses. No carotid bruits.  Abdomen: Soft, no tenderness, Bowel sounds positive.  Musculoskeletal: Bilateral lower extremity lymphedema, was able to move all extremities equally. Neurologic: CN  2-12 grossly intact. Sensation intact, No focal deficit identified Psychiatric: Alert and oriented x 3. Normal mood.    Labs on Admission: I have personally reviewed following labs and imaging studies  CBC: Recent Labs  Lab 04/30/24 0528 05/05/24 0952  WBC 6.0 4.8  NEUTROABS 3.8 3.0  HGB 9.6* 8.7*  HCT 31.5* 28.3*  MCV 88.0 86.8  PLT 251 254   Basic Metabolic Panel: Recent Labs  Lab 04/30/24 0528 05/05/24 0952  NA 139 135  K 3.9 4.9  CL 102 98  CO2 29 28  GLUCOSE 142* 120*  BUN 17 39*  CREATININE 0.88 2.25*  CALCIUM 9.0 8.8*   GFR: Estimated Creatinine Clearance: 36 mL/min (A) (by C-G formula based on SCr of 2.25 mg/dL (H)). Liver Function Tests: Recent Labs  Lab 04/30/24 0528 05/05/24 0952  AST 23 27  ALT 18 18  ALKPHOS 119 111  BILITOT 0.3 0.2  PROT 7.6 7.1  ALBUMIN 4.0 3.8   Recent Labs  Lab 04/30/24 0528  LIPASE 23   No results for input(s): AMMONIA in the last 168 hours. Coagulation Profile: No results for input(s): INR, PROTIME in the last 168 hours. Cardiac Enzymes: No results for input(s): CKTOTAL, CKMB, CKMBINDEX, TROPONINI, TROPONINIHS in the last 168 hours. BNP (last 3 results) No results for input(s): BNP in the last 8760  hours. HbA1C: No results for input(s): HGBA1C in the last 72 hours. CBG: No results for input(s): GLUCAP in the last 168 hours. Lipid Profile: No results for input(s): CHOL, HDL, LDLCALC, TRIG, CHOLHDL, LDLDIRECT in the last 72 hours. Thyroid Function Tests: No results for input(s): TSH, T4TOTAL, FREET4, T3FREE, THYROIDAB in the last 72 hours. Anemia Panel: No results for input(s): VITAMINB12, FOLATE, FERRITIN, TIBC, IRON, RETICCTPCT in the last 72 hours. Urine analysis:    Component Value Date/Time   COLORURINE YELLOW (A) 05/05/2024 1151   APPEARANCEUR CLEAR (A) 05/05/2024 1151   APPEARANCEUR Clear 03/05/2013 1643   LABSPEC 1.005 05/05/2024 1151   LABSPEC 1.019 03/05/2013 1643   PHURINE 6.0 05/05/2024 1151   GLUCOSEU NEGATIVE 05/05/2024 1151   GLUCOSEU Negative 03/05/2013 1643   HGBUR NEGATIVE 05/05/2024 1151   BILIRUBINUR NEGATIVE 05/05/2024 1151   BILIRUBINUR Negative 03/05/2013 1643   KETONESUR NEGATIVE 05/05/2024 1151   PROTEINUR NEGATIVE 05/05/2024 1151   NITRITE POSITIVE (A) 05/05/2024 1151   LEUKOCYTESUR TRACE (A) 05/05/2024 1151   LEUKOCYTESUR Negative 03/05/2013 1643    Radiological Exams on Admission: I have personally reviewed images CT HEAD WO CONTRAST ( ) Result Date: 05/05/2024 EXAM: CT HEAD WITHOUT CONTRAST 05/05/2024 08:59:32 AM TECHNIQUE: CT of the head was performed without the administration of intravenous contrast. Automated exposure control, iterative reconstruction, and/or weight based adjustment of the mA/kV was utilized to reduce the radiation dose to as low as reasonably achievable. COMPARISON: None available. CLINICAL HISTORY: Headache, neuro deficit FINDINGS: BRAIN AND VENTRICLES: Ventricles and cisterns as well as csf bases are within normal. Small perivascular space just inferior to the right basal ganglia. No acute hemorrhage. No evidence of acute infarct. No hydrocephalus. No extra-axial collection. No mass  effect or midline shift. ORBITS: No acute abnormality. SINUSES: No acute abnormality. SOFT TISSUES AND SKULL: No acute soft tissue abnormality. No skull fracture. IMPRESSION: 1. No acute intracranial abnormality. Electronically signed by: Toribio Agreste MD 05/05/2024 09:04 AM EST RP Workstation: [id number]   DG Chest 2 View Result Date: 05/05/2024 CLINICAL DATA:  Shortness of breath. EXAM: DG CHEST 2V COMPARISON:  04/30/2024 FINDINGS: Stable top-normal heart size. Very low  lung volumes bilaterally. Probable chronic pulmonary venous hypertension without overt pulmonary edema. No airspace disease, pneumothorax or pleural fluid. The visualized skeletal structures are unremarkable. IMPRESSION: Very low lung volumes with probable chronic pulmonary venous hypertension. Electronically Signed   By: Marcey Moan M.D.   On: 05/05/2024 08:48    EKG: My personal interpretation of EKG shows: Sinus rhythm at 86 bpm    Assessment/Plan Principal Problem:   AKI (acute kidney injury) Active Problems:   GERD (gastroesophageal reflux disease)   Hyperlipidemia, mixed   Hypertension, essential   Lymphedema   Morbid obesity (HCC)    Assessment and Plan: 68 year old morbidly obese female with history of hypertension, diabetes, hyperlipidemia, chronic lymphedema, chronic anemia who came into ED complaining of bilateral lower extremity swelling and not feeling well.  1.  AKI - Patient has multiple reasons to have AKI including Lasix 40 mg 1 tablet by mouth daily started on 11/28, patient takes Motrin, diclofenac , lisinopril , naproxen. - Will place her in observation. - Will give her gentle hydration and repeat kidney function - Avoid neurotoxic drugs including NSAIDs  2.  Chronic anemia on iron supplementation - She also has a history of possible colon cancer on chart. - Will continue ferrous sulfate  twice a day - Continue to monitor hemoglobin hematocrit - Continue Protonix  - Will get iron panel, B12,  folate, TSH, FOBT  3.  Bilateral leg swelling in the setting of lymphedema - Her oxygenation is normal - Due to AKI-I am going to hold all diuretics. - She may have baseline shortness of breath due to lymphedema as there was no obvious signs of heart failure at this point - Will get echocardiogram  4.  HTN/HLD - Resume home medication except those medication can hurt kidney function - Will place her on hydralazine as needed for systolic blood pressure more than 160  5.  GERD - Continue Protonix  - Patient takes omeprazole at home     DVT prophylaxis: SQ Heparin Code Status: Full Code Family Communication: None  Disposition Plan: Home  Consults called: None  Admission status: Observation, Telemetry bed   Nena Rebel, MD [facility] 05/05/2024, 12:45 PM

## 2024-05-05 NOTE — ED Notes (Signed)
 Lab tech at bedside

## 2024-05-05 NOTE — ED Notes (Signed)
 Fall bundle placed by Jordan, [place]

## 2024-05-06 ENCOUNTER — Observation Stay

## 2024-05-06 LAB — COMPREHENSIVE METABOLIC PANEL WITH GFR
ALT: 13 U/L (ref 0–44)
AST: 23 U/L (ref 15–41)
Albumin: 3.4 g/dL — ABNORMAL LOW (ref 3.5–5.0)
Alkaline Phosphatase: 98 U/L (ref 38–126)
Anion gap: 9 (ref 5–15)
BUN: 27 mg/dL — ABNORMAL HIGH (ref 8–23)
CO2: 26 mmol/L (ref 22–32)
Calcium: 8.6 mg/dL — ABNORMAL LOW (ref 8.9–10.3)
Chloride: 105 mmol/L (ref 98–111)
Creatinine, Ser: 1.32 mg/dL — ABNORMAL HIGH (ref 0.44–1.00)
GFR, Estimated: 44 mL/min — ABNORMAL LOW (ref >60)
Glucose, Bld: 121 mg/dL — ABNORMAL HIGH (ref 70–99)
Potassium: 4.7 mmol/L (ref 3.5–5.1)
Sodium: 140 mmol/L (ref 135–145)
Total Bilirubin: 0.2 mg/dL (ref 0.0–1.2)
Total Protein: 6.8 g/dL (ref 6.5–8.1)

## 2024-05-06 LAB — PROTIME-INR
INR: 1 (ref 0.8–1.2)
Prothrombin Time: 14.3 s (ref 11.4–15.2)

## 2024-05-06 LAB — VITAMIN D 25 HYDROXY (VIT D DEFICIENCY, FRACTURES): Vit D, 25-Hydroxy: 26.78 ng/mL — ABNORMAL LOW (ref 30–100)

## 2024-05-06 LAB — ECHOCARDIOGRAM COMPLETE
AR max vel: 2.57 cm2
AV Area VTI: 2.58 cm2
AV Area mean vel: 2.47 cm2
AV Mean grad: 7.3 mmHg
AV Peak grad: 13.2 mmHg
Ao pk vel: 1.82 m/s
Area-P 1/2: 4.07 cm2
Height: 63 in
S' Lateral: 2.8 cm
Weight: 5520 [oz_av]

## 2024-05-06 LAB — CBC
HCT: 28.9 % — ABNORMAL LOW (ref 36.0–46.0)
Hemoglobin: 8.8 g/dL — ABNORMAL LOW (ref 12.0–15.0)
MCH: 26.7 pg (ref 26.0–34.0)
MCHC: 30.4 g/dL (ref 30.0–36.0)
MCV: 87.8 fL (ref 80.0–100.0)
Platelets: 258 K/uL (ref 150–400)
RBC: 3.29 MIL/uL — ABNORMAL LOW (ref 3.87–5.11)
RDW: 13.3 % (ref 11.5–15.5)
WBC: 4.5 K/uL (ref 4.0–10.5)
nRBC: 0 % (ref 0.0–0.2)

## 2024-05-06 LAB — PHOSPHORUS: Phosphorus: 3.3 mg/dL (ref 2.5–4.6)

## 2024-05-06 LAB — MAGNESIUM: Magnesium: 2.9 mg/dL — ABNORMAL HIGH (ref 1.7–2.4)

## 2024-05-06 LAB — OCCULT BLOOD X 1 CARD TO LAB, STOOL: Fecal Occult Bld: NEGATIVE

## 2024-05-06 LAB — CK: Total CK: 193 U/L (ref 38–234)

## 2024-05-06 MED ORDER — SODIUM CHLORIDE 0.9 % IV SOLN: INTRAVENOUS | Status: AC

## 2024-05-06 NOTE — Plan of Care (Signed)
  Problem: Clinical Measurements: Goal: Ability to maintain clinical measurements within normal limits will improve Outcome: Progressing Goal: Will remain free from infection Outcome: Progressing Goal: Diagnostic test results will improve Outcome: Progressing Goal: Respiratory complications will improve Outcome: Progressing Goal: Cardiovascular complication will be avoided Outcome: Progressing   Problem: Elimination: Goal: Will not experience complications related to urinary retention Outcome: Progressing   Problem: Pain Managment: Goal: General experience of comfort will improve and/or be controlled Outcome: Progressing

## 2024-05-06 NOTE — TOC Initial Note (Signed)
 Transition of Care [facility]) - Initial/Assessment Note    Patient Details  Name: Nancy Riley MRN: [id number] Date of Birth: Oct 09, 1955  Transition of Care [facility]) CM/SW Contact:    Daved JONETTA Hamilton, RN Phone Number: 05/06/2024, 6:19 PM  Clinical Narrative:                  Met with patient, introduced self and explained role. Patient's daughter Nancy Riley at bedside. Patient verbalized prior to hospital encounter she is independent in ADL's including driving herself wherever/whenever she needed to go. Patient verbalizes she helps take care of her mother and goes to her home daily. Discussed with patient therapy recommendations for SNF-STR once medically ready for discharge. Patient verbally declined this as an option. This CM discussed with patient if HH is an option would she be agreeable to this. Patient verbalized she wouldn't be home to have the therapy as she goes to her mother's each day. This CM advised can inform [facility] agencies that therapy will take place at her mother's address. Patient verbalized agreement to this option. Patient provided verbal permission for [facility] to contact her daughter Nancy Riley if needed [phone]  [facility] notified care team of patient's refusal of SNF and acceptance of HH.  Referrals sent in HUB with patient's mother's address listed. TOC to follow up with patient for [facility] choice.     Expected Discharge Plan: Home w Home Health Services Barriers to Discharge: Continued Medical Work up   Patient Goals and CMS Choice Patient states their goals for this hospitalization and ongoing recovery are:: get back home and continue to help take care of her mother          Expected Discharge Plan and Services   Discharge Planning Services: CM Consult Post Acute Care Choice: Home Health Living arrangements for the past 2 months: Single Family Home                                      Prior Living Arrangements/Services Living arrangements for the past 2 months:  Single Family Home Lives with:: Self Patient language and need for interpreter reviewed:: Yes Do you feel safe going back to the place where you live?: Yes      Need for Family Participation in Patient Care: Yes (Comment) Care giver support system in place?: Yes (comment)   Criminal Activity/Legal Involvement Pertinent to Current Situation/Hospitalization: No - Comment as needed  Activities of Daily Living   ADL Screening (condition at time of admission) Independently performs ADLs?: No Does the patient have a NEW difficulty with bathing/dressing/toileting/self-feeding that is expected to last >3 days?: No Does the patient have a NEW difficulty with getting in/out of bed, walking, or climbing stairs that is expected to last >3 days?: Yes (Initiates electronic notice to provider for possible PT consult) Does the patient have a NEW difficulty with communication that is expected to last >3 days?: No Is the patient deaf or have difficulty hearing?: No Does the patient have difficulty seeing, even when wearing glasses/contacts?: No Does the patient have difficulty concentrating, remembering, or making decisions?: No  Permission Sought/Granted Permission sought to share information with : Case Manager, Facility Medical Sales Representative, Family Supports    Share Information with NAME: Nancy Riley     Permission granted to share info w Relationship: Daughter  Permission granted to share info w Contact Information: [phone]  Emotional Assessment Appearance:: Appears stated age, Well-Groomed Attitude/Demeanor/Rapport:  Engaged Affect (typically observed): Appropriate Orientation: : Oriented to Self, Oriented to Place, Oriented to  Time, Oriented to Situation Alcohol / Substance Use: Not Applicable Psych Involvement: No (comment)  Admission diagnosis:  Weakness [R53.1] Leg swelling [M79.89] AKI (acute kidney injury) [N17.9] Patient Active Problem List   Diagnosis Date Noted   AKI (acute  kidney injury) 05/05/2024   Positive colorectal cancer screening using Cologuard test 02/12/2023   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/03/2021   Senile nuclear sclerosis, bilateral 12/03/2021   PAD (peripheral artery disease) 08/01/2021   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   Osteoarthritis 10/04/2019   Acute osteomyelitis of thoracic spine (HCC) 10/04/2019   Spondylodiscitis 04/26/2019   Lymphedema 04/26/2019   Morbid obesity (HCC) 04/26/2019   BMI 45.0-49.9, adult (HCC) 08/20/2018   Controlled type 2 diabetes mellitus without complication, without long-term current use of insulin (HCC) 11/26/2017   GERD (gastroesophageal reflux disease) 11/26/2017   Hyperlipidemia, mixed 11/26/2017   Hypertension, essential 11/26/2017   Chest pain at rest 11/26/2017   PCP:  Buren Rock HERO, MD Pharmacy:   [facility] - [place], [place] - [address] [place] [address] Phone: [phone] Fax: [phone]  [facility] [address] [place] - [address] [place] [address] Phone: [phone] Fax: [phone]  [facility] [address] [place] - [address] [place] [address] Phone: [phone] Fax: [phone]     Social Drivers of Health (SDOH) Social History: SDOH Screenings   Food Insecurity: Patient Declined (05/05/2024)  Housing: Patient Declined (05/05/2024)  Transportation Needs: Patient Declined (05/05/2024)  Utilities: Patient Declined (05/05/2024)  Financial Resource Strain: Medium Risk (12/30/2023)   Received from [facility]  Social Connections: Patient Declined (05/05/2024)  Tobacco Use: Medium Risk (05/05/2024)   SDOH Interventions:     Readmission Risk Interventions     No data to display

## 2024-05-06 NOTE — Care Plan (Signed)
 Legs wrapped as ordered with ACE wraps and elevated with pillows  No other concerns at this time.

## 2024-05-06 NOTE — NC FL2 (Signed)
 [place]  MEDICAID FL2 LEVEL OF CARE FORM     IDENTIFICATION  Patient Name: Nancy Riley Birthdate: 1956-02-01 Sex: female Admission Date (Current Location): 05/05/2024  [facility] and [place] Number:  Chiropodist and Address:  [facility], [address], [place], [place] [address]      Provider Number: [id number]  Attending Physician Name and Address:  Von Bellis, MD  Relative Name and Phone Number:       Current Level of Care: Hospital Recommended Level of Care: Skilled Nursing Facility Prior Approval Number:    Date Approved/Denied:   PASRR Number: [id number]  Discharge Plan: SNF    Current Diagnoses: Patient Active Problem List   Diagnosis Date Noted   AKI (acute kidney injury) 05/05/2024   Positive colorectal cancer screening using Cologuard test 02/12/2023   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/03/2021   Senile nuclear sclerosis, bilateral 12/03/2021   PAD (peripheral artery disease) 08/01/2021   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   Osteoarthritis 10/04/2019   Acute osteomyelitis of thoracic spine (HCC) 10/04/2019   Spondylodiscitis 04/26/2019   Lymphedema 04/26/2019   Morbid obesity (HCC) 04/26/2019   BMI 45.0-49.9, adult (HCC) 08/20/2018   Controlled type 2 diabetes mellitus without complication, without long-term current use of insulin (HCC) 11/26/2017   GERD (gastroesophageal reflux disease) 11/26/2017   Hyperlipidemia, mixed 11/26/2017   Hypertension, essential 11/26/2017   Chest pain at rest 11/26/2017    Orientation RESPIRATION BLADDER Height & Weight     Self, Time, Situation, Place  Normal Incontinent Weight: (!) 345 lb (156.5 kg) Height:  5' 3 (160 cm)  BEHAVIORAL SYMPTOMS/MOOD NEUROLOGICAL BOWEL NUTRITION STATUS      Incontinent Diet (Regular)  AMBULATORY STATUS COMMUNICATION OF NEEDS Skin   Limited Assist Verbally Normal                       Personal Care Assistance Level of  Assistance  Bathing, Dressing, Feeding Bathing Assistance: Limited assistance Feeding assistance: Independent Dressing Assistance: Limited assistance     Functional Limitations Info  Sight, Hearing, Speech Sight Info: Impaired Hearing Info: Adequate Speech Info: Adequate    SPECIAL CARE FACTORS FREQUENCY  PT (By licensed PT), OT (By licensed OT)     PT Frequency: 5x/week OT Frequency: 5x/week            Contractures      Additional Factors Info  Code Status, Allergies Code Status Info: Full Allergies Info: NKA           Current Medications (05/06/2024):  This is the current hospital active medication list Current Facility-Administered Medications  Medication Dose Route Frequency Provider Last Rate Last Admin   0.9 %  sodium chloride  infusion   Intravenous Continuous Von Bellis, MD 50 mL/hr at 05/06/24 0856 New Bag at 05/06/24 0856   acetaminophen  (TYLENOL ) tablet 650 mg  650 mg Oral Q6H PRN Roann Gouty, MD       Or   acetaminophen  (TYLENOL ) suppository 650 mg  650 mg Rectal Q6H PRN Roann Gouty, MD       ferrous sulfate  tablet 325 mg  325 mg Oral BID WC Paudel, Keshab, MD   325 mg at 05/06/24 0855   gabapentin  (NEURONTIN ) capsule 300 mg  300 mg Oral TID Paudel, Keshab, MD   300 mg at 05/06/24 0855   heparin  injection 5,000 Units  5,000 Units Subcutaneous Q8H Paudel, Keshab, MD   5,000 Units at 05/06/24 0555   ondansetron  (ZOFRAN )  tablet 4 mg  4 mg Oral Q6H PRN Paudel, Keshab, MD       Or   ondansetron (ZOFRAN) injection 4 mg  4 mg Intravenous Q6H PRN Paudel, Nena, MD       oxyCODONE  (Oxy IR/ROXICODONE ) immediate release tablet 5 mg  5 mg Oral Q4H PRN Paudel, Nena, MD       pantoprazole  (PROTONIX ) EC tablet 40 mg  40 mg Oral BID AC Paudel, Keshab, MD   40 mg at 05/06/24 0854   pneumococcal 20-valent conjugate vaccine (PREVNAR 20) injection 0.5 mL  0.5 mL Intramuscular Tomorrow-1000 Paudel, Keshab, MD       polyethylene glycol (MIRALAX / GLYCOLAX) packet 17  g  17 g Oral Daily PRN Paudel, Keshab, MD   17 g at 05/06/24 0855   rosuvastatin (CRESTOR) tablet 20 mg  20 mg Oral QHS Paudel, Nena, MD   20 mg at 05/05/24 2102   traMADol  (ULTRAM ) tablet 50 mg  50 mg Oral Q6H PRN Paudel, Keshab, MD         Discharge Medications: Please see discharge summary for a list of discharge medications.  Relevant Imaging Results:  Relevant Lab Results:   Additional Information SSN: [id number]  Alvaro Louder, LCSW

## 2024-05-06 NOTE — Progress Notes (Signed)
 [facility] Progress Note  Nancy Riley: Nancy Riley    FMW:[id number]  DOA: 05/05/2024     Date of Service: the Nancy Riley was seen and examined on 05/06/2024  Chief Complaint  Nancy Riley presents with   Leg Swelling   Brief hospital course: Nancy Riley is a pleasant 68 y.o. female with medical history significant for chronic lymphedema, obesity, diabetes, GERD, HTN, HLD, chronic anemia on iron supplementation, spinal stenosis who came into ED at [facility] complaining of not feeling well and leg swelling.  Nancy Riley had a similar visit to [facility] ED on 04/30/2024 and 04/24/2024.  On 11/22 2025 Nancy Riley was prescribed hydrochlorothiazide  and discharged home.  On 04/30/2024, Nancy Riley was given Lasix for a week and was discharged from the emergency room as there was no hypoxemia.  Nancy Riley went home took Lasix for 5 days and she did not feel better rather she felt worse and she did not even have urination after taking those medications.  She stopped taking diuretics 2 days ago.  Nancy Riley stated that she has been having this lymphedema for years but she felt that this has been getting slightly worse.  So she came into ED on 11 8/22 and 11/28.  Now she feels slightly better but overall she does not feel good.  She denies any fever, chills, nausea, vomiting, diarrhea, abdominal pain. She complains that she was not able to urinate even after diuretics.  Yesterday, she felt weak and she was almost slipped down at her home but did not hit her head or did not injure any part of the body.   ED Course: Upon arrival to the ED, Nancy Riley is found to have lymphedema on both lower extremities, EKG showed normal sinus rhythm, x-ray showed no evidence of congestion.  She had creatinine 0.8 a week ago now it is 2.25.  Hospital service was consulted for evaluation for admission for AKI.     Assessment and Plan:  # AKI - Nancy Riley has multiple reasons to have AKI including Lasix 40 mg 1 tablet by mouth daily started on 11/28,  Nancy Riley takes Motrin, diclofenac , lisinopril , naproxen. - s/p IVF given on admission.   - Avoid neurotoxic drugs including NSAIDs sCr 2.25 >> 1.32 improved, continue IV hydration overnight Monitor renal function and urine output Bladder scan negative for retention US  abd: bladder distention normal for the degree, no any other acute findings, poor quality due to body habitus.    # Chronic anemia on iron supplementation - She also has a history of possible colon cancer on chart. - Will continue ferrous sulfate  twice a day - Continue to monitor hemoglobin hematocrit - Continue Protonix  Iron profile, B12 and folate within normal range FOBT negative    # Bilateral leg swelling in the setting of lymphedema - Her oxygenation is normal - Due to AKI-hold all diuretics. - She may have baseline shortness of breath due to lymphedema as there was no obvious signs of heart failure at this point.  BNP 77.8 within normal range Using Ace wrap's, Nancy Riley was advised to follow-up with lymphedema clinic as an outpatient for possible lymphedema pump for long-term solution  Follow-up TTE   # HTN/HLD Held Lasix, HCTZ, olmesartan due to AKI Resumed Crestor Started hydralazine as needed Monitor BP and titrate medications accordingly    # GERD - Continue Protonix  - Nancy Riley takes omeprazole at home   # Super super morbid obesity Body mass index is 61.11 kg/m.  Interventions: Calorie restricted diet and daily exercise advised to lose body  weight.  Lifestyle modification discussed.   Diet: Heart healthy diet DVT Prophylaxis: Subcutaneous Heparin    Advance goals of care discussion: Full code  Family Communication: family was not present at bedside, at the time of interview.  The pt provided permission to discuss medical plan with the family. Opportunity was given to ask question and all questions were answered satisfactorily.   Disposition:  Pt is from Home, admitted with lower extremity  edema, found to have AKI, still has elevated creatinine, which precludes a safe discharge. Discharge to SNF, when stable, most likely in 1 to 2 days.  Subjective: No significant events overnight.  Nancy Riley as well as significant lymphedema in the bilateral lower extremities.   Having difficult ambulation due to knee osteoarthritis. Denied any other complaints.   Physical Exam: General: NAD, lying comfortably Appear in no distress, affect appropriate Eyes: PERRLA ENT: Oral Mucosa Clear, moist  Neck: no JVD,  Cardiovascular: S1 and S2 Present, no Murmur,  Respiratory: good respiratory effort, Bilateral Air entry equal and Decreased, no Crackles, no wheezes Abdomen: Bowel Sound present, Soft and no tenderness,  Skin: no rashes Extremities: Chronic lymphedema bilateral lower extremities Neurologic: without any new focal findings Gait not checked due to Nancy Riley safety concerns  Vitals:   05/05/24 2025 05/05/24 2314 05/06/24 0338 05/06/24 0738  BP: (!) 144/51 (!) 144/51 (!) 121/50 (!) 144/61  Pulse: 88 88 89 85  Resp: 17 17 18    Temp: 99.3 F (37.4 C) 99.3 F (37.4 C) 98.5 F (36.9 C) 98.3 F (36.8 C)  TempSrc: Oral Oral Oral   SpO2: 96%  95% 100%  Weight:  (!) 156.5 kg    Height:  5' 3 (1.6 m)      Intake/Output Summary (Last 24 hours) at 05/06/2024 1506 Last data filed at 05/06/2024 1449 Gross per 24 hour  Intake 2218.57 ml  Output 5650 ml  Net -3431.43 ml   Filed Weights   05/05/24 0822 05/05/24 2314  Weight: (!) 156.5 kg (!) 156.5 kg    Data Reviewed: I have personally reviewed and interpreted daily labs, tele strips, imagings as discussed above. I reviewed all nursing notes, pharmacy notes, vitals, pertinent old records I have discussed plan of care as described above with RN and Nancy Riley/family.  CBC: Recent Labs  Lab 04/30/24 0528 05/05/24 0952 05/06/24 0530  WBC 6.0 4.8 4.5  NEUTROABS 3.8 3.0  --   HGB 9.6* 8.7* 8.8*  HCT 31.5* 28.3* 28.9*  MCV 88.0  86.8 87.8  PLT 251 254 258   Basic Metabolic Panel: Recent Labs  Lab 04/30/24 0528 05/05/24 0952 05/06/24 0500 05/06/24 0530  NA 139 135  --  140  K 3.9 4.9  --  4.7  CL 102 98  --  105  CO2 29 28  --  26  GLUCOSE 142* 120*  --  121*  BUN 17 39*  --  27*  CREATININE 0.88 2.25*  --  1.32*  CALCIUM 9.0 8.8*  --  8.6*  MG  --   --  2.9*  --   PHOS  --   --  3.3  --     Studies: US  RENAL Result Date: 05/06/2024 CLINICAL DATA:  Acute kidney injury. EXAM: RENAL / URINARY TRACT ULTRASOUND COMPLETE COMPARISON:  Abdomen and pelvis CT dated 04/30/2024. FINDINGS: Right Kidney: Renal measurements: 10.2 x 4.1 x 4.0 cm = volume: 86 mL. Echogenicity within normal limits. No mass or hydronephrosis visualized. Left Kidney: Renal measurements: 9.6 x 5.3 x  4.8 cm = volume: 126 mL. Echogenicity within normal limits. No mass or hydronephrosis visualized. Bladder: Appears normal for degree of bladder distention. Other: The examination was limited by Nancy Riley body habitus and bowel-gas. IMPRESSION: Normal examination, limited by Nancy Riley body habitus and bowel-gas. Electronically Signed   By: Elspeth Bathe M.D.   On: 05/06/2024 13:58    Scheduled Meds:  ferrous sulfate   325 mg Oral BID WC   gabapentin   300 mg Oral TID   heparin  5,000 Units Subcutaneous Q8H   pantoprazole   40 mg Oral BID AC   rosuvastatin  20 mg Oral QHS   Continuous Infusions:  sodium chloride  50 mL/hr at 05/06/24 0856   PRN Meds: acetaminophen  **OR** acetaminophen , ondansetron **OR** ondansetron (ZOFRAN) IV, oxyCODONE , polyethylene glycol, traMADol   Time spent: 35 minutes  Author: ELVAN SOR. MD [facility] 05/06/2024 3:06 PM  To reach On-call, see care teams to locate the attending and reach out to them via [email or web address]. If 7PM-7AM, please contact night-coverage If you still have difficulty reaching the attending provider, please page the [facility] (Director on Call) for [facility] on amion for assistance.

## 2024-05-06 NOTE — TOC Initial Note (Signed)
 Transition of Care [facility]) - Initial/Assessment Note    Patient Details  Name: Nancy Riley MRN: [id number] Date of Birth: 1956/04/07  [facility] CM/SW Contact:    Alvaro Louder, LCSW Phone Number: 05/06/2024, 10:04 AM  Clinical Narrative:      Per chart review patient is from home. PCP is Rock Pounds LCSWA faxed out patient to SNF's in [place]. TOC to present facilities to patient at the bedside.            TOC to follow for discharge         Patient Goals and CMS Choice            Expected Discharge Plan and Services                                              Prior Living Arrangements/Services                       Activities of Daily Living   ADL Screening (condition at time of admission) Independently performs ADLs?: No Does the patient have a NEW difficulty with bathing/dressing/toileting/self-feeding that is expected to last >3 days?: No Does the patient have a NEW difficulty with getting in/out of bed, walking, or climbing stairs that is expected to last >3 days?: Yes (Initiates electronic notice to provider for possible PT consult) Does the patient have a NEW difficulty with communication that is expected to last >3 days?: No Is the patient deaf or have difficulty hearing?: No Does the patient have difficulty seeing, even when wearing glasses/contacts?: No Does the patient have difficulty concentrating, remembering, or making decisions?: No  Permission Sought/Granted                  Emotional Assessment              Admission diagnosis:  Weakness [R53.1] Leg swelling [M79.89] AKI (acute kidney injury) [N17.9] Patient Active Problem List   Diagnosis Date Noted   AKI (acute kidney injury) 05/05/2024   Positive colorectal cancer screening using Cologuard test 02/12/2023   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/03/2021   Senile nuclear sclerosis, bilateral 12/03/2021   PAD (peripheral artery  disease) 08/01/2021   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   Osteoarthritis 10/04/2019   Acute osteomyelitis of thoracic spine (HCC) 10/04/2019   Spondylodiscitis 04/26/2019   Lymphedema 04/26/2019   Morbid obesity (HCC) 04/26/2019   BMI 45.0-49.9, adult (HCC) 08/20/2018   Controlled type 2 diabetes mellitus without complication, without long-term current use of insulin (HCC) 11/26/2017   GERD (gastroesophageal reflux disease) 11/26/2017   Hyperlipidemia, mixed 11/26/2017   Hypertension, essential 11/26/2017   Chest pain at rest 11/26/2017   PCP:  [place] HERO, MD Pharmacy:   [facility] - [place], [place] - [address] [place] [address] Phone: [phone] Fax: [phone]  [facility] [address] [place] - [address] [place] [address] Phone: [phone] Fax: [phone]  [facility] [address] [place] - [address] [place] [address] Phone: [phone] Fax: [phone]     Social Drivers of Health (SDOH) Social History: SDOH Screenings   Food Insecurity: Patient Declined (05/05/2024)  Housing: Patient Declined (05/05/2024)  Transportation Needs: Patient Declined (05/05/2024)  Utilities: Patient Declined (05/05/2024)  Financial Resource Strain: Medium Risk (12/30/2023)   Received from [facility]  Social Connections: Patient Declined (05/05/2024)  Tobacco Use: Medium Risk (05/05/2024)   SDOH Interventions:     Readmission Risk Interventions     No data to display

## 2024-05-06 NOTE — Evaluation (Signed)
 Occupational Therapy Evaluation Patient Details Name: Nancy Riley MRN: [id number] DOB: 1956/05/24 Today's Date: 05/06/2024   History of Present Illness   68 y.o. female with medical history significant for chronic lymphedema, obesity, diabetes, GERD, HTN, HLD, chronic anemia on iron supplementation, spinal stenosis who came into ED at [facility] complaining of not feeling well and leg swelling.  Patient had a similar visit to [facility] ED on 04/30/2024 and 04/24/2024.     Clinical Impressions Patient was seen for OT evaluation this date. Prior to hospital admission, patient was living alone, she states her daughter goes back and forth between her home and her own home. Patient was able to manage minimal ADLs and IADLs without A but reports that it was difficult and taxing. Patient is able to don compression stockings to BLE (lymphedema) on her own at baseline. Patient has been hospitalized due to continued swelling of BLE with increased pain. Patient agreeable to OT evaluation, patient put forth good effort in attempt to transition to EOB but ultimately requiring max A. She has increased pain to BLE with light touch. OT educated on UB mobility HEP while sitting EOB which patient tolerated and participated in. Patient required max-total A x 2 to transition back to supine and move up in bed.  Patient presents with deficits in standing tolerance, UB strength, LB mobility and overall activity tolerance, affecting safe and optimal ADL completion. Patient is currently requiring anticipated max-total A for LB self care tasks.  Paient would benefit from skilled OT services to address noted impairments and functional limitations (see below for any additional details) in order to maximize safety and independence while minimizing future risk of falls, injury, and readmission. Anticipate the need for follow up OT services upon acute hospital DC.      If plan is discharge home, recommend the following:   Two  people to help with walking and/or transfers;Two people to help with bathing/dressing/bathroom;Help with stairs or ramp for entrance     Functional Status Assessment   Patient has had a recent decline in their functional status and demonstrates the ability to make significant improvements in function in a reasonable and predictable amount of time.     Equipment Recommendations   Other (comment) (defer to next venue)     Recommendations for Other Services         Precautions/Restrictions   Precautions Precautions: Fall Recall of Precautions/Restrictions: Intact Restrictions Weight Bearing Restrictions Per Provider Order: No     Mobility Bed Mobility Overal bed mobility: Needs Assistance Bed Mobility: Supine to Sit, Sidelying to Sit     Supine to sit: Max assist, +2 for physical assistance, HOB elevated, Used rails Sit to supine: Max assist, Total assist, HOB elevated, Used rails   General bed mobility comments: patient unable to advance BLE towards EOB on this date due to increaed pain; A for all parts    Transfers                          Balance Overall balance assessment: Needs assistance Sitting-balance support: No upper extremity supported Sitting balance-Leahy Scale: Fair   Postural control: Posterior lean                                 ADL either performed or assessed with clinical judgement   ADL Overall ADL's : Needs assistance/impaired  Upper Body Bathing: Moderate assistance   Lower Body Bathing: Maximal assistance;Total assistance;+2 for physical assistance   Upper Body Dressing : Moderate assistance   Lower Body Dressing: Total assistance;Maximal assistance;+2 for physical assistance   Toilet Transfer: Maximal assistance;+2 for physical assistance   Toileting- Clothing Manipulation and Hygiene: Maximal assistance;Total assistance;+2 for physical assistance       Functional mobility during ADLs:  Maximal assistance;Total assistance General ADL Comments: anticipated scores, patient requires max-total A x 2 for bed mobility and to transition to EOB, unable to tolerate longer than 5 minutes due to BLE pain.     Vision         Perception         Praxis         Pertinent Vitals/Pain Pain Assessment Pain Assessment: 0-10 Pain Score: 2  Pain Location: acute on chronic back and b/l knee pain Pain Descriptors / Indicators: Aching Pain Intervention(s): Limited activity within patient's tolerance, Monitored during session     Extremity/Trunk Assessment Upper Extremity Assessment Upper Extremity Assessment: Generalized weakness   Lower Extremity Assessment Lower Extremity Assessment: Defer to PT evaluation       Communication Communication Communication: No apparent difficulties   Cognition Arousal: Alert Behavior During Therapy: WFL for tasks assessed/performed Cognition: No apparent impairments                               Following commands: Intact       Cueing  General Comments   Cueing Techniques: Verbal cues      Exercises     Shoulder Instructions      Home Living Family/patient expects to be discharged to:: Private residence Living Arrangements: Alone Available Help at Discharge: Family;Available PRN/intermittently Type of Home: House Home Access: Ramped entrance     Home Layout: One level     Bathroom Shower/Tub: Sponge bathes at baseline   Bathroom Toilet: Handicapped height Bathroom Accessibility: Yes How Accessible: Accessible via walker Home Equipment: Rollator (4 wheels);Grab bars - toilet          Prior Functioning/Environment Prior Level of Function : Independent/Modified Independent             Mobility Comments: Pt sleeps in a lift chair, does get out of the house, drives across the street and goes up ramp to cook breakfast for mother almost daily, limited ambulation (<100 ft) at any one time with  4WW ADLs Comments: manages ADLs on her own, reports its difficult at times but manageable    OT Problem List: Decreased strength;Decreased activity tolerance;Impaired balance (sitting and/or standing);Decreased knowledge of use of DME or AE   OT Treatment/Interventions: Self-care/ADL training;Therapeutic exercise;Energy conservation;DME and/or AE instruction;Patient/family education;Balance training      OT Goals(Current goals can be found in the care plan section)   Acute Rehab OT Goals Patient Stated Goal: to go home OT Goal Formulation: With patient Time For Goal Achievement: 05/20/24 Potential to Achieve Goals: Fair ADL Goals Pt Will Perform Grooming: with set-up;sitting;standing Pt Will Perform Lower Body Bathing: with set-up;with adaptive equipment;sit to/from stand Pt Will Perform Lower Body Dressing: with supervision;sitting/lateral leans;sit to/from stand Pt Will Transfer to Toilet: with supervision;bedside commode   OT Frequency:  Min 2X/week    Co-evaluation              AM-PAC OT 6 Clicks Daily Activity     Outcome Measure Help from another person eating meals?: None Help from  another person taking care of personal grooming?: A Little Help from another person toileting, which includes using toliet, bedpan, or urinal?: Total Help from another person bathing (including washing, rinsing, drying)?: A Lot Help from another person to put on and taking off regular upper body clothing?: A Lot Help from another person to put on and taking off regular lower body clothing?: Total 6 Click Score: 13   End of Session Nurse Communication: Mobility status  Activity Tolerance: Patient limited by pain Patient left: in bed;with bed alarm set;with nursing/sitter in room;with call bell/phone within reach  OT Visit Diagnosis: Unsteadiness on feet (R26.81);Muscle weakness (generalized) (M62.81);History of falling (Z91.81)                Time: 8944-8876 OT Time Calculation  (min): 28 min Charges:  OT General Charges $OT Visit: 1 Visit OT Evaluation $OT Eval Low Complexity: 1 Low  Rogers Clause, OT/L MSOT, 05/06/2024

## 2024-05-06 NOTE — Care Management Obs Status (Signed)
 MEDICARE OBSERVATION STATUS NOTIFICATION   Patient Details  Name: Nancy Riley MRN: [id number] Date of Birth: 08-16-1955   Medicare Observation Status Notification Given:  Yes    Shukri Nistler W, CMA 05/06/2024, 3:17 PM

## 2024-05-07 ENCOUNTER — Other Ambulatory Visit: Payer: Self-pay

## 2024-05-07 LAB — PRO BRAIN NATRIURETIC PEPTIDE: Pro Brain Natriuretic Peptide: 298 pg/mL (ref <300.0)

## 2024-05-07 LAB — BASIC METABOLIC PANEL WITH GFR
Anion gap: 9 (ref 5–15)
BUN: 16 mg/dL (ref 8–23)
CO2: 27 mmol/L (ref 22–32)
Calcium: 9 mg/dL (ref 8.9–10.3)
Chloride: 104 mmol/L (ref 98–111)
Creatinine, Ser: 1.04 mg/dL — ABNORMAL HIGH (ref 0.44–1.00)
GFR, Estimated: 59 mL/min — ABNORMAL LOW (ref >60)
Glucose, Bld: 110 mg/dL — ABNORMAL HIGH (ref 70–99)
Potassium: 4.3 mmol/L (ref 3.5–5.1)
Sodium: 140 mmol/L (ref 135–145)

## 2024-05-07 LAB — CBC
HCT: 31.9 % — ABNORMAL LOW (ref 36.0–46.0)
Hemoglobin: 9.7 g/dL — ABNORMAL LOW (ref 12.0–15.0)
MCH: 26.6 pg (ref 26.0–34.0)
MCHC: 30.4 g/dL (ref 30.0–36.0)
MCV: 87.4 fL (ref 80.0–100.0)
Platelets: 237 K/uL (ref 150–400)
RBC: 3.65 MIL/uL — ABNORMAL LOW (ref 3.87–5.11)
RDW: 13.4 % (ref 11.5–15.5)
WBC: 4.8 K/uL (ref 4.0–10.5)
nRBC: 0 % (ref 0.0–0.2)

## 2024-05-07 LAB — GLUCOSE, CAPILLARY: Glucose-Capillary: 149 mg/dL — ABNORMAL HIGH (ref 70–99)

## 2024-05-07 LAB — MAGNESIUM: Magnesium: 2.3 mg/dL (ref 1.7–2.4)

## 2024-05-07 LAB — PHOSPHORUS: Phosphorus: 3.3 mg/dL (ref 2.5–4.6)

## 2024-05-07 MED ORDER — VITAMIN D (ERGOCALCIFEROL) 1.25 MG (50000 UNIT) PO CAPS
50000.0000 [IU] | ORAL_CAPSULE | ORAL | 0 refills | Status: AC
  Filled 2024-05-07: qty 12, 84d supply, fill #0

## 2024-05-07 MED ORDER — VITAMIN D (ERGOCALCIFEROL) 1.25 MG (50000 UNIT) PO CAPS
50000.0000 [IU] | ORAL_CAPSULE | ORAL | Status: DC
  Administered 2024-05-07: 50000 [IU] via ORAL
  Filled 2024-05-07: qty 1

## 2024-05-07 MED ORDER — ACETAMINOPHEN 325 MG PO TABS: 650.0000 mg | ORAL_TABLET | Freq: Four times a day (QID) | ORAL | Status: AC | PRN

## 2024-05-07 NOTE — Plan of Care (Signed)

## 2024-05-07 NOTE — Discharge Summary (Signed)
 [facility] Discharge Summary   Patient: Nancy Riley FMW:[id number]  PCP: [place] HERO, MD  Date of admission: 05/05/2024   Date of discharge:  05/07/2024     Discharge Diagnoses:  Principal Problem:   AKI (acute kidney injury) Active Problems:   GERD (gastroesophageal reflux disease)   Hyperlipidemia, mixed   Hypertension, essential   Lymphedema   Morbid obesity (HCC)   Admitted From: Home Disposition:  Home with HH PT/OT, declined SNF placement  Recommendations for Outpatient Follow-up:  PCP: F/u with PCPin 1 wk F/u with lymphedema clinic for LE pump, use ace wraps in the mean time, ambulate and decrease body weight Repeat BMP in 1 -2 weeks  Vit d level in btw 3-6 months Follow up LABS/TEST:  as above   Diet recommendation: Cardiac diet  Activity: The patient is advised to gradually reintroduce usual activities, as tolerated  Discharge Condition: stable  Code Status: Full code   History of present illness: As per the H and P dictated on admission.  Hospital Course:  Nancy Riley is a pleasant 68 y.o. female with medical history significant for chronic lymphedema, obesity, diabetes, GERD, HTN, HLD, chronic anemia on iron supplementation, spinal stenosis who came into ED at [facility] complaining of not feeling well and leg swelling.  Patient had a similar visit to [facility] ED on 04/30/2024 and 04/24/2024.  On 11/22 2025 patient was prescribed hydrochlorothiazide  and discharged home.  On 04/30/2024, patient was given Lasix  for a week and was discharged from the emergency room as there was no hypoxemia.  Patient went home took Lasix  for 5 days and she did not feel better rather she felt worse and she did not even have urination after taking those medications.  She stopped taking diuretics 2 days ago.  Patient stated that she has been having this lymphedema for years but she felt that this has been getting slightly worse.  So she came into ED on 11 8/22 and 11/28.  Now she  feels slightly better but overall she does not feel good.  She denies any fever, chills, nausea, vomiting, diarrhea, abdominal pain. She complains that she was not able to urinate even after diuretics.  Yesterday, she felt weak and she was almost slipped down at her home but did not hit her head or did not injure any part of the body.   ED Course: Upon arrival to the ED, patient is found to have lymphedema on both lower extremities, EKG showed normal sinus rhythm, x-ray showed no evidence of congestion.  She had creatinine 0.8 a week ago now it is 2.25.  Hospital service was consulted for evaluation for admission for AKI.       Assessment and Plan:  # AKI - Patient has multiple reasons to have AKI including Lasix  40 mg 1 tablet by mouth daily started on 11/28, patient takes Motrin, diclofenac , lisinopril , naproxen. - s/p IVF given on admission.   - Avoid neurotoxic drugs including NSAIDs sCr 2.25 >> 1.32>>1.04 improved, s/p IVF hydration overnight Bladder scan negative for retention US  abd: bladder distention normal for the degree, no any other acute findings, poor quality due to body habitus. 12/5 AKI resolved, patient seems stable, stable to discharge home today.  # Chronic anemia on iron supplementation - She also has a history of possible colon cancer on chart. - continue ferrous sulfate  twice a day - Continue Protonix , Hb 9.7, remained stable. FOBT negative Iron profile, B12 and folate within normal range   #  Bilateral leg swelling in the setting of lymphedema - Her oxygenation is normal. Due to AKI-hold all diuretics. - She may have baseline shortness of breath due to morbid obesity, hypoventilation syndrome and OSA.   CHF ruled out, BNP 77.8 within normal range.  TTE: LVEF 65 to 70%, no any other significant findings Using Ace wrap's, patient was advised to follow-up with lymphedema clinic as an outpatient for possible lymphedema pump for long-term solution     # HTN/HLD Held  Lasix , HCTZ, olmesartan due to AKI Resumed Crestor .  12/5 AKI resolved, resumed olmesartan home dose.  # GERD-continued omeprazole home dose    # Super super morbid obesity Body mass index is 61.11 kg/m.  Interventions: Calorie restricted diet and daily exercise advised to lose body weight.  Lifestyle modification discussed.    Body mass index is 61.11 kg/m.  Nutrition Interventions:  - Patient was instructed, not to drive, operate heavy machinery, perform activities at heights, swimming or participation in water activities or provide baby sitting services while on Pain, Sleep and Anxiety Medications; until her outpatient Physician has advised to do so again.  - Also recommended to not to take more than prescribed Pain, Sleep and Anxiety Medications.  Patient was seen by physical therapy, who recommended Therapy, SNF placement but declined, so home health was arranged by TOC. On the day of the discharge the patient's vitals were stable, and no other acute medical condition were reported by patient. the patient was felt safe to be discharge at Home with Therapy.  Consultants: None Procedures: None  Discharge Exam: General: Appear in no distress, Oral Mucosa Clear, moist. Cardiovascular: S1 and S2 Present, no Murmur, Respiratory: normal respiratory effort, Bilateral Air entry present and no Crackles, no wheezes Abdomen: Bowel Sound present, Soft and no tenderness. Extremities: no Pedal edema, no calf tenderness Neurology: alert and oriented to time, place, and person affect appropriate.  Filed Weights   05/05/24 0822 05/05/24 2314  Weight: (!) 156.5 kg (!) 156.5 kg   Vitals:   05/07/24 0510 05/07/24 0742  BP: (!) 161/77 138/61  Pulse: 79 80  Resp: 18 17  Temp: 97.7 F (36.5 C) 98.5 F (36.9 C)  SpO2: 100% 96%    DISCHARGE MEDICATION: Allergies as of 05/07/2024   No Known Allergies      Medication List     STOP taking these medications    furosemide  40 MG  tablet Commonly known as: Lasix    ibuprofen 800 MG tablet Commonly known as: ADVIL   lisinopril  10 MG tablet Commonly known as: ZESTRIL    naproxen sodium 220 MG tablet Commonly known as: ALEVE   potassium chloride  10 MEQ CR capsule Commonly known as: MICRO-K    potassium chloride  10 MEQ tablet Commonly known as: KLOR-CON  M   prednisoLONE acetate 1 % ophthalmic suspension Commonly known as: PRED FORTE       TAKE these medications    acetaminophen  325 MG tablet Commonly known as: TYLENOL  Take 2 tablets (650 mg total) by mouth every 6 (six) hours as needed for mild pain (pain score 1-3), fever or headache (or Fever >/= 101).   cyclobenzaprine  10 MG tablet Commonly known as: FLEXERIL  Take 10 mg by mouth 3 (three) times daily as needed.   diclofenac  Sodium 1 % Gel Commonly known as: VOLTAREN  Apply topically 4 (four) times daily.   DropSafe Alcohol Prep 70 % Pads Apply topically.   FeroSul 325 (65 Fe) MG tablet Generic drug: ferrous sulfate  Take by mouth.   gabapentin   300 MG capsule Commonly known as: NEURONTIN  Take 300 mg by mouth 3 (three) times daily.   hydrochlorothiazide  25 MG tablet Commonly known as: HYDRODIURIL  Take 25 mg by mouth daily.   loratadine  10 MG tablet Commonly known as: CLARITIN  Take 10 mg by mouth daily.   lovastatin 20 MG tablet Commonly known as: MEVACOR Take 20 mg by mouth every evening.   magnesium  oxide 400 MG tablet Commonly known as: MAG-OX Take 1 tablet by mouth 2 (two) times daily.   metFORMIN 500 MG 24 hr tablet Commonly known as: GLUCOPHAGE-XR Take 500 mg by mouth daily.   olmesartan 20 MG tablet Commonly known as: BENICAR Take 20 mg by mouth daily.   omeprazole 20 MG capsule Commonly known as: PRILOSEC Take 20 mg by mouth daily.   rosuvastatin  20 MG tablet Commonly known as: CRESTOR  Take 20 mg by mouth at bedtime.   traMADol  50 MG tablet Commonly known as: Ultram  Take 1 tablet (50 mg total) by mouth every 6  (six) hours as needed.   True Metrix Blood Glucose Test test strip Generic drug: glucose blood SMARTSIG:Via Meter   TRUEplus Lancets 33G Misc SMARTSIG:Lancet Topical   Vitamin D  (Ergocalciferol ) 1.25 MG (50000 UNIT) Caps capsule Commonly known as: DRISDOL  Take 1 capsule (50,000 Units total) by mouth every 7 (seven) days. Start taking on: May 14, 2024       No Known Allergies Discharge Instructions     Call MD for:  difficulty breathing, headache or visual disturbances   Complete by: As directed    Call MD for:  extreme fatigue   Complete by: As directed    Call MD for:  persistant dizziness or light-headedness   Complete by: As directed    Call MD for:  persistant nausea and vomiting   Complete by: As directed    Call MD for:  severe uncontrolled pain   Complete by: As directed    Call MD for:  temperature >100.4   Complete by: As directed    Diet - low sodium heart healthy   Complete by: As directed    Discharge instructions   Complete by: As directed    F/u with PCPin 1 wk F/u with lymphedema clinic for LE pump, use ace wraps in the mean time, ambulate and decrease body weight Repeat BMP in 1 -2 weeks  Vit d level in btw 3-6 months   Increase activity slowly   Complete by: As directed        The results of significant diagnostics from this hospitalization (including imaging, microbiology, ancillary and laboratory) are listed below for reference.    Significant Diagnostic Studies: ECHOCARDIOGRAM COMPLETE Result Date: 05/06/2024    ECHOCARDIOGRAM REPORT   Patient Name:   TANAIA HAWKEY Date of Exam: 05/05/2024 Medical Rec #:  [id number]         Height:       63.0 in Accession #:    [id number]        Weight:       345.0 lb Date of Birth:  Apr 05, 1956        BSA:          2.438 m Patient Age:    68 years          BP:           144/51 mmHg Patient Gender: F                 HR:  88 bpm. Exam Location:  [facility] Procedure: 2D Echo, Cardiac Doppler and Color  Doppler (Both Spectral and Color            Flow Doppler were utilized during procedure). Indications:     I50.31 Acute Diastolic CHF  History:         Patient has no prior history of Echocardiogram examinations.                  Risk Factors:Hypertension and Diabetes.  Sonographer:     Carl Coma RDCS Referring Phys:  [id number] [facility] PAUDEL Diagnosing Phys: Dwayne D Callwood MD IMPRESSIONS  1. Left ventricular ejection fraction, by estimation, is 65 to 70%. The left ventricle has normal function. The left ventricle has no regional wall motion abnormalities. Left ventricular diastolic parameters were normal.  2. Right ventricular systolic function is normal. The right ventricular size is normal.  3. The mitral valve is normal in structure. Mild mitral valve regurgitation.  4. The aortic valve is normal in structure. Aortic valve regurgitation is not visualized. FINDINGS  Left Ventricle: Left ventricular ejection fraction, by estimation, is 65 to 70%. The left ventricle has normal function. The left ventricle has no regional wall motion abnormalities. Strain was performed and the global longitudinal strain is indeterminate. The left ventricular internal cavity size was normal in size. There is no left ventricular hypertrophy. Left ventricular diastolic parameters were normal. Right Ventricle: The right ventricular size is normal. No increase in right ventricular wall thickness. Right ventricular systolic function is normal. Left Atrium: Left atrial size was normal in size. Right Atrium: Right atrial size was normal in size. Pericardium: There is no evidence of pericardial effusion. Mitral Valve: The mitral valve is normal in structure. Mild mitral valve regurgitation. Tricuspid Valve: The tricuspid valve is normal in structure. Tricuspid valve regurgitation is trivial. The aortic valve is normal in structure. Aortic valve regurgitation is not visualized. Pulmonic Valve: The pulmonic valve was normal in  structure. Pulmonic valve regurgitation is not visualized. Aorta: The ascending aorta was not well visualized. IAS/Shunts: No atrial level shunt detected by color flow Doppler. Additional Comments: 3D was performed not requiring image post processing on an independent workstation and was indeterminate.  LEFT VENTRICLE PLAX 2D LVIDd:         5.30 cm   Diastology LVIDs:         2.80 cm   LV e' medial:    10.60 cm/s LV PW:         0.80 cm   LV E/e' medial:  9.8 LV IVS:        0.80 cm   LV e' lateral:   14.87 cm/s LVOT diam:     2.10 cm   LV E/e' lateral: 7.0 LV SV:         85 LV SV Index:   35 LVOT Area:     3.46 cm  RIGHT VENTRICLE             IVC RV Basal diam:  3.30 cm     IVC diam: 2.00 cm RV S prime:     17.83 cm/s TAPSE (M-mode): 2.8 cm LEFT ATRIUM             Index        RIGHT ATRIUM           Index LA diam:        3.20 cm 1.31 cm/m   RA Area:     10.50 cm LA  Vol [facility]):   38.5 ml 15.79 ml/m  RA Volume:   24.30 ml  9.97 ml/m LA Vol (A4C):   45.2 ml 18.54 ml/m LA Biplane Vol: 44.2 ml 18.13 ml/m  AORTIC VALVE AV Area (Vmax):    2.57 cm AV Area (Vmean):   2.47 cm AV Area (VTI):     2.58 cm AV Vmax:           181.75 cm/s AV Vmean:          125.000 cm/s AV VTI:            0.330 m AV Peak Grad:      13.2 mmHg AV Mean Grad:      7.2 mmHg LVOT Vmax:         134.67 cm/s LVOT Vmean:        89.233 cm/s LVOT VTI:          0.246 m LVOT/AV VTI ratio: 0.74  AORTA Ao Root diam: 2.80 cm Ao Asc diam:  3.30 cm MITRAL VALVE MV Area (PHT): 4.07 cm     SHUNTS MV Decel Time: 187 msec     Systemic VTI:  0.25 m MV E velocity: 104.15 cm/s  Systemic Diam: 2.10 cm MV A velocity: 98.50 cm/s MV E/A ratio:  1.06 Cara JONETTA Lovelace MD Electronically signed by Cara JONETTA Lovelace MD Signature Date/Time: 05/06/2024/7:45:53 PM    Final    US  RENAL Result Date: 05/06/2024 CLINICAL DATA:  Acute kidney injury. EXAM: RENAL / URINARY TRACT ULTRASOUND COMPLETE COMPARISON:  Abdomen and pelvis CT dated 04/30/2024. FINDINGS: Right Kidney: Renal  measurements: 10.2 x 4.1 x 4.0 cm = volume: 86 mL. Echogenicity within normal limits. No mass or hydronephrosis visualized. Left Kidney: Renal measurements: 9.6 x 5.3 x 4.8 cm = volume: 126 mL. Echogenicity within normal limits. No mass or hydronephrosis visualized. Bladder: Appears normal for degree of bladder distention. Other: The examination was limited by patient body habitus and bowel-gas. IMPRESSION: Normal examination, limited by patient body habitus and bowel-gas. Electronically Signed   By: Elspeth Bathe M.D.   On: 05/06/2024 13:58   CT HEAD WO CONTRAST ( ) Result Date: 05/05/2024 EXAM: CT HEAD WITHOUT CONTRAST 05/05/2024 08:59:32 AM TECHNIQUE: CT of the head was performed without the administration of intravenous contrast. Automated exposure control, iterative reconstruction, and/or weight based adjustment of the mA/kV was utilized to reduce the radiation dose to as low as reasonably achievable. COMPARISON: None available. CLINICAL HISTORY: Headache, neuro deficit FINDINGS: BRAIN AND VENTRICLES: Ventricles and cisterns as well as csf bases are within normal. Small perivascular space just inferior to the right basal ganglia. No acute hemorrhage. No evidence of acute infarct. No hydrocephalus. No extra-axial collection. No mass effect or midline shift. ORBITS: No acute abnormality. SINUSES: No acute abnormality. SOFT TISSUES AND SKULL: No acute soft tissue abnormality. No skull fracture. IMPRESSION: 1. No acute intracranial abnormality. Electronically signed by: Toribio Agreste MD 05/05/2024 09:04 AM EST RP Workstation: [id number]   DG Chest 2 View Result Date: 05/05/2024 CLINICAL DATA:  Shortness of breath. EXAM: DG CHEST 2V COMPARISON:  04/30/2024 FINDINGS: Stable top-normal heart size. Very low lung volumes bilaterally. Probable chronic pulmonary venous hypertension without overt pulmonary edema. No airspace disease, pneumothorax or pleural fluid. The visualized skeletal structures are unremarkable.  IMPRESSION: Very low lung volumes with probable chronic pulmonary venous hypertension. Electronically Signed   By: Marcey Moan M.D.   On: 05/05/2024 08:48   CT ABDOMEN PELVIS W CONTRAST Result Date: 04/30/2024 EXAM: CT  ABDOMEN AND PELVIS WITH CONTRAST 04/30/2024 08:11:26 AM TECHNIQUE: CT of the abdomen and pelvis was performed with the administration of 125 mL of iohexol  (OMNIPAQUE ) 350 MG/ML injection. Multiplanar reformatted images are provided for review. Automated exposure control, iterative reconstruction, and/or weight-based adjustment of the mA/kV was utilized to reduce the radiation dose to as low as reasonably achievable. COMPARISON: CT of the abdomen and pelvis dated 11/18/2017. CLINICAL HISTORY: LUQ and left flank pain. FINDINGS: LOWER CHEST: No acute abnormality. LIVER: The liver is unremarkable. GALLBLADDER AND BILE DUCTS: Gallbladder is unremarkable. No biliary ductal dilatation. SPLEEN: No acute abnormality. PANCREAS: No acute abnormality. ADRENAL GLANDS: No acute abnormality. KIDNEYS, URETERS AND BLADDER: No stones in the kidneys or ureters. No hydronephrosis. No perinephric or periureteral stranding. Urinary bladder is unremarkable. GI AND BOWEL: Stomach demonstrates no acute abnormality. There is no bowel obstruction. There are numerous colonic diverticula present. PERITONEUM AND RETROPERITONEUM: No ascites. No free air. VASCULATURE: Aorta is normal in caliber. LYMPH NODES: No lymphadenopathy. REPRODUCTIVE ORGANS: No acute abnormality. BONES AND SOFT TISSUES: Moderate degenerative changes within the lumbar spine. No acute osseous abnormality. No focal soft tissue abnormality. IMPRESSION: 1. No acute findings in the abdomen or pelvis. 2. Numerous colonic diverticula without evidence of diverticulitis. Electronically signed by: Evalene Coho MD 04/30/2024 08:22 AM EST RP Workstation: [id number]   CT Angio Chest Pulmonary Embolism (PE) W or WO Contrast Result Date: 04/30/2024 EXAM: CTA  CHEST 04/30/2024 08:11:26 AM TECHNIQUE: CTA of the chest was performed without and with the administration of 125 mL of iohexol  (OMNIPAQUE ) 350 MG/ML injection. Multiplanar reformatted images are provided for review. MIP images are provided for review. Automated exposure control, iterative reconstruction, and/or weight based adjustment of the mA/kV was utilized to reduce the radiation dose to as low as reasonably achievable. COMPARISON: None available. CLINICAL HISTORY: Shortness of breath (SOB), left lateral chest pain (CP), left upper quadrant (LUQ) pain. FINDINGS: PULMONARY ARTERIES: Pulmonary arteries are adequately opacified for evaluation. No acute pulmonary embolus. Main pulmonary artery is normal in caliber. MEDIASTINUM: The heart and pericardium demonstrate no acute abnormality. There is no acute abnormality of the thoracic aorta. LYMPH NODES: No mediastinal, hilar or axillary lymphadenopathy. LUNGS AND PLEURA: There is mild mosaic attenuation of the lungs. No focal consolidation or pulmonary edema. No evidence of pleural effusion or pneumothorax. UPPER ABDOMEN: Limited images of the upper abdomen are unremarkable. SOFT TISSUES AND BONES: There is mild-to-moderate diffuse degenerative disc disease throughout the THORACIC SPINE. No acute fracture or other acute bone abnormality. No acute soft tissue abnormality. IMPRESSION: 1. No pulmonary embolism. 2. Mild mosaic attenuation of the lungs. Electronically signed by: Evalene Coho MD 04/30/2024 08:20 AM EST RP Workstation: [id number]   DG Chest Portable 1 View Result Date: 04/30/2024 EXAM: 1 VIEW(S) XRAY OF THE CHEST 04/30/2024 04:31:00 AM COMPARISON: PA and lateral radiographs of the chest dated 11/18/2017. CLINICAL HISTORY: L sided chest pain, SOB. FINDINGS: LUNGS AND PLEURA: No focal pulmonary opacity. No pleural effusion. No pneumothorax. HEART AND MEDIASTINUM: Aortic calcification. BONES AND SOFT TISSUES: No acute osseous abnormality. IMPRESSION:  1. No acute findings. Electronically signed by: Evalene Coho MD 04/30/2024 04:37 AM EST RP Workstation: [id number]    Microbiology: Recent Results (from the past 240 hours)  Resp panel by RT-PCR (RSV, Flu A&B, Covid) Anterior Nasal Swab     Status: None   Collection Time: 04/30/24  7:47 AM   Specimen: Anterior Nasal Swab  Result Value Ref Range Status   SARS Coronavirus 2 by RT PCR NEGATIVE  NEGATIVE Final    Comment: (NOTE) SARS-CoV-2 target nucleic acids are NOT DETECTED.  The SARS-CoV-2 RNA is generally detectable in upper respiratory specimens during the acute phase of infection. The lowest concentration of SARS-CoV-2 viral copies this assay can detect is 138 copies/mL. A negative result does not preclude SARS-Cov-2 infection and should not be used as the sole basis for treatment or other patient management decisions. A negative result may occur with  improper specimen collection/handling, submission of specimen other than nasopharyngeal swab, presence of viral mutation(s) within the areas targeted by this assay, and inadequate number of viral copies(<138 copies/mL). A negative result must be combined with clinical observations, patient history, and epidemiological information. The expected result is Negative.  Fact Sheet for Patients:  bloggercourse.com  Fact Sheet for Healthcare Providers:  seriousbroker.it  This test is no t yet approved or cleared by the [place]  FDA and  has been authorized for detection and/or diagnosis of SARS-CoV-2 by FDA under an Emergency Use Authorization (EUA). This EUA will remain  in effect (meaning this test can be used) for the duration of the COVID-19 declaration under Section 564(b)(1) of the Act, 21 U.S.C.section 360bbb-3(b)(1), unless the authorization is terminated  or revoked sooner.       Influenza A by PCR NEGATIVE NEGATIVE Final   Influenza B by PCR NEGATIVE NEGATIVE Final     Comment: (NOTE) The Xpert Xpress SARS-CoV-2/FLU/RSV plus assay is intended as an aid in the diagnosis of influenza from Nasopharyngeal swab specimens and should not be used as a sole basis for treatment. Nasal washings and aspirates are unacceptable for Xpert Xpress SARS-CoV-2/FLU/RSV testing.  Fact Sheet for Patients: bloggercourse.com  Fact Sheet for Healthcare Providers: seriousbroker.it  This test is not yet approved or cleared by the [place]  FDA and has been authorized for detection and/or diagnosis of SARS-CoV-2 by FDA under an Emergency Use Authorization (EUA). This EUA will remain in effect (meaning this test can be used) for the duration of the COVID-19 declaration under Section 564(b)(1) of the Act, 21 U.S.C. section 360bbb-3(b)(1), unless the authorization is terminated or revoked.     Resp Syncytial Virus by PCR NEGATIVE NEGATIVE Final    Comment: (NOTE) Fact Sheet for Patients: bloggercourse.com  Fact Sheet for Healthcare Providers: seriousbroker.it  This test is not yet approved or cleared by the [place]  FDA and has been authorized for detection and/or diagnosis of SARS-CoV-2 by FDA under an Emergency Use Authorization (EUA). This EUA will remain in effect (meaning this test can be used) for the duration of the COVID-19 declaration under Section 564(b)(1) of the Act, 21 U.S.C. section 360bbb-3(b)(1), unless the authorization is terminated or revoked.  Performed at [facility], [address] [place], [place] [address]      Labs: CBC: Recent Labs  Lab 05/05/24 [phone] 05/06/24 0530 05/07/24 0556  WBC 4.8 4.5 4.8  NEUTROABS 3.0  --   --   HGB 8.7* 8.8* 9.7*  HCT 28.3* 28.9* 31.9*  MCV 86.8 87.8 87.4  PLT 254 258 237   Basic Metabolic Panel: Recent Labs  Lab 05/05/24 0952 05/06/24 0500 05/06/24 0530 05/07/24 0556  NA 135   --  140 140  K 4.9  --  4.7 4.3  CL 98  --  105 104  CO2 28  --  26 27  GLUCOSE 120*  --  121* 110*  BUN 39*  --  27* 16  CREATININE 2.25*  --  1.32* 1.04*  CALCIUM  8.8*  --  8.6* 9.0  MG  --  2.9*  --  2.3  PHOS  --  3.3  --  3.3   Liver Function Tests: Recent Labs  Lab 05/05/24 0952 05/06/24 0530  AST 27 23  ALT 18 13  ALKPHOS 111 98  BILITOT 0.2 0.2  PROT 7.1 6.8  ALBUMIN 3.8 3.4*   No results for input(s): LIPASE, AMYLASE in the last 168 hours. No results for input(s): AMMONIA in the last 168 hours. Cardiac Enzymes: Recent Labs  Lab 05/06/24 0500  CKTOTAL 193   BNP (last 3 results) No results for input(s): BNP in the last 8760 hours. CBG: Recent Labs  Lab 05/07/24 0820  GLUCAP 149*    Time spent: 35 minutes  Signed:  Elvan Sor  [facility] 05/07/2024 11:07 AM

## 2024-05-07 NOTE — Plan of Care (Signed)
